# Patient Record
Sex: Female | Born: 1945 | Race: Black or African American | Hispanic: No | Smoking: Never smoker
Health system: Southern US, Community
[De-identification: ages and names within clinical notes are randomized; demographics above are authoritative.]

## PROBLEM LIST (undated history)

## (undated) DIAGNOSIS — T7840XA Allergy, unspecified, initial encounter: Secondary | ICD-10-CM

## (undated) DIAGNOSIS — I739 Peripheral vascular disease, unspecified: Secondary | ICD-10-CM

## (undated) DIAGNOSIS — R112 Nausea with vomiting, unspecified: Secondary | ICD-10-CM

## (undated) DIAGNOSIS — C50919 Malignant neoplasm of unspecified site of unspecified female breast: Secondary | ICD-10-CM

## (undated) DIAGNOSIS — J45909 Unspecified asthma, uncomplicated: Secondary | ICD-10-CM

## (undated) DIAGNOSIS — M349 Systemic sclerosis, unspecified: Secondary | ICD-10-CM

## (undated) DIAGNOSIS — I10 Essential (primary) hypertension: Secondary | ICD-10-CM

## (undated) DIAGNOSIS — M1990 Unspecified osteoarthritis, unspecified site: Secondary | ICD-10-CM

## (undated) DIAGNOSIS — Z9889 Other specified postprocedural states: Secondary | ICD-10-CM

## (undated) DIAGNOSIS — E7800 Pure hypercholesterolemia, unspecified: Secondary | ICD-10-CM

## (undated) DIAGNOSIS — K279 Peptic ulcer, site unspecified, unspecified as acute or chronic, without hemorrhage or perforation: Secondary | ICD-10-CM

## (undated) HISTORY — DX: Malignant neoplasm of unspecified site of unspecified female breast: C50.919

## (undated) HISTORY — DX: Essential (primary) hypertension: I10

## (undated) HISTORY — DX: Peptic ulcer, site unspecified, unspecified as acute or chronic, without hemorrhage or perforation: K27.9

## (undated) HISTORY — DX: Unspecified osteoarthritis, unspecified site: M19.90

## (undated) HISTORY — PX: OTHER SURGICAL HISTORY: SHX169

## (undated) HISTORY — PX: KNEE ARTHROPLASTY: SHX992

## (undated) HISTORY — DX: Peripheral vascular disease, unspecified: I73.9

## (undated) HISTORY — DX: Allergy, unspecified, initial encounter: T78.40XA

## (undated) HISTORY — PX: CYST REMOVAL HAND: SHX6279

## (undated) HISTORY — DX: Pure hypercholesterolemia, unspecified: E78.00

---

## 1992-02-20 DIAGNOSIS — M940 Chondrocostal junction syndrome [Tietze]: Secondary | ICD-10-CM | POA: Insufficient documentation

## 2003-11-19 DIAGNOSIS — S161XXA Strain of muscle, fascia and tendon at neck level, initial encounter: Secondary | ICD-10-CM | POA: Insufficient documentation

## 2006-01-25 ENCOUNTER — Other Ambulatory Visit: Admission: RE | Admit: 2006-01-25 | Discharge: 2006-01-25 | Payer: Self-pay | Admitting: Family Medicine

## 2006-11-24 ENCOUNTER — Ambulatory Visit: Payer: Self-pay | Admitting: Family Medicine

## 2006-11-29 ENCOUNTER — Ambulatory Visit: Payer: Self-pay | Admitting: Family Medicine

## 2006-11-29 ENCOUNTER — Encounter: Admission: RE | Admit: 2006-11-29 | Discharge: 2006-11-29 | Payer: Self-pay | Admitting: *Deleted

## 2007-01-31 ENCOUNTER — Ambulatory Visit: Payer: Self-pay | Admitting: Family Medicine

## 2008-04-15 ENCOUNTER — Ambulatory Visit: Payer: Self-pay | Admitting: Family Medicine

## 2008-06-23 LAB — HM COLONOSCOPY

## 2008-07-02 ENCOUNTER — Other Ambulatory Visit: Admission: RE | Admit: 2008-07-02 | Discharge: 2008-07-02 | Payer: Self-pay | Admitting: Family Medicine

## 2008-07-02 ENCOUNTER — Ambulatory Visit: Payer: Self-pay | Admitting: Family Medicine

## 2008-07-02 ENCOUNTER — Encounter: Payer: Self-pay | Admitting: Family Medicine

## 2008-07-05 ENCOUNTER — Telehealth: Payer: Self-pay | Admitting: Gastroenterology

## 2008-07-05 ENCOUNTER — Ambulatory Visit: Payer: Self-pay | Admitting: Gastroenterology

## 2008-07-12 ENCOUNTER — Ambulatory Visit: Payer: Self-pay | Admitting: Family Medicine

## 2008-07-17 ENCOUNTER — Ambulatory Visit: Payer: Self-pay | Admitting: Gastroenterology

## 2008-10-01 ENCOUNTER — Ambulatory Visit: Payer: Self-pay | Admitting: Family Medicine

## 2009-03-24 ENCOUNTER — Ambulatory Visit: Payer: Self-pay | Admitting: Family Medicine

## 2009-08-22 ENCOUNTER — Ambulatory Visit: Payer: Self-pay | Admitting: Family Medicine

## 2010-01-05 ENCOUNTER — Ambulatory Visit: Payer: Self-pay | Admitting: Family Medicine

## 2010-06-08 ENCOUNTER — Ambulatory Visit: Payer: Self-pay | Admitting: Family Medicine

## 2010-07-10 ENCOUNTER — Ambulatory Visit: Payer: Self-pay | Admitting: Family Medicine

## 2010-08-11 ENCOUNTER — Ambulatory Visit: Payer: Self-pay | Admitting: Cardiology

## 2011-02-03 ENCOUNTER — Encounter: Payer: Self-pay | Admitting: Family Medicine

## 2011-02-03 DIAGNOSIS — G43809 Other migraine, not intractable, without status migrainosus: Secondary | ICD-10-CM

## 2011-02-03 DIAGNOSIS — E876 Hypokalemia: Secondary | ICD-10-CM

## 2011-02-03 DIAGNOSIS — S161XXA Strain of muscle, fascia and tendon at neck level, initial encounter: Secondary | ICD-10-CM

## 2011-02-03 DIAGNOSIS — M940 Chondrocostal junction syndrome [Tietze]: Secondary | ICD-10-CM

## 2011-03-04 ENCOUNTER — Ambulatory Visit (INDEPENDENT_AMBULATORY_CARE_PROVIDER_SITE_OTHER): Payer: BC Managed Care – PPO | Admitting: Family Medicine

## 2011-03-04 ENCOUNTER — Encounter: Payer: Self-pay | Admitting: Family Medicine

## 2011-03-04 VITALS — BP 114/76 | HR 72 | Ht 68.4 in | Wt 178.0 lb

## 2011-03-04 DIAGNOSIS — Z0000 Encounter for general adult medical examination without abnormal findings: Secondary | ICD-10-CM

## 2011-03-04 DIAGNOSIS — M129 Arthropathy, unspecified: Secondary | ICD-10-CM

## 2011-03-04 DIAGNOSIS — D229 Melanocytic nevi, unspecified: Secondary | ICD-10-CM

## 2011-03-04 DIAGNOSIS — M1990 Unspecified osteoarthritis, unspecified site: Secondary | ICD-10-CM

## 2011-03-04 DIAGNOSIS — J309 Allergic rhinitis, unspecified: Secondary | ICD-10-CM

## 2011-03-04 DIAGNOSIS — S161XXA Strain of muscle, fascia and tendon at neck level, initial encounter: Secondary | ICD-10-CM

## 2011-03-04 DIAGNOSIS — E785 Hyperlipidemia, unspecified: Secondary | ICD-10-CM

## 2011-03-04 DIAGNOSIS — D239 Other benign neoplasm of skin, unspecified: Secondary | ICD-10-CM

## 2011-03-04 DIAGNOSIS — I10 Essential (primary) hypertension: Secondary | ICD-10-CM | POA: Insufficient documentation

## 2011-03-04 LAB — LIPID PANEL
Cholesterol: 200 mg/dL (ref 0–200)
LDL Cholesterol: 127 mg/dL — ABNORMAL HIGH (ref 0–99)
Total CHOL/HDL Ratio: 3.8 Ratio
Triglycerides: 99 mg/dL (ref <150)
VLDL: 20 mg/dL (ref 0–40)

## 2011-03-04 LAB — CBC WITH DIFFERENTIAL/PLATELET
Basophils Absolute: 0 10*3/uL (ref 0.0–0.1)
Eosinophils Absolute: 0.2 10*3/uL (ref 0.0–0.7)
Eosinophils Relative: 5 % (ref 0–5)
HCT: 42.5 % (ref 36.0–46.0)
Lymphocytes Relative: 47 % — ABNORMAL HIGH (ref 12–46)
MCHC: 33.4 g/dL (ref 30.0–36.0)
Neutrophils Relative %: 37 % — ABNORMAL LOW (ref 43–77)
WBC: 4.4 10*3/uL (ref 4.0–10.5)

## 2011-03-04 LAB — COMPREHENSIVE METABOLIC PANEL
AST: 29 U/L (ref 0–37)
Albumin: 4.5 g/dL (ref 3.5–5.2)
Alkaline Phosphatase: 70 U/L (ref 39–117)
Creat: 0.83 mg/dL (ref 0.40–1.20)
Glucose, Bld: 95 mg/dL (ref 70–99)
Potassium: 3.6 mEq/L (ref 3.5–5.3)
Sodium: 141 mEq/L (ref 135–145)
Total Bilirubin: 1 mg/dL (ref 0.3–1.2)
Total Protein: 6.9 g/dL (ref 6.0–8.3)

## 2011-03-04 LAB — POCT URINALYSIS DIPSTICK
Glucose, UA: NEGATIVE
Ketones, UA: NEGATIVE
Protein, UA: NEGATIVE

## 2011-03-04 MED ORDER — LISINOPRIL-HYDROCHLOROTHIAZIDE 10-12.5 MG PO TABS: 1.0000 | ORAL_TABLET | Freq: Every day | ORAL | Status: DC

## 2011-03-04 MED ORDER — ROSUVASTATIN CALCIUM 40 MG PO TABS: 40.0000 mg | ORAL_TABLET | Freq: Every day | ORAL | Status: DC

## 2011-03-04 MED ORDER — EZETIMIBE 10 MG PO TABS: 10.0000 mg | ORAL_TABLET | Freq: Every day | ORAL | Status: DC

## 2011-03-04 NOTE — Patient Instructions (Signed)
Continue on present medications. Continue to make plans for your retirement.

## 2011-03-04 NOTE — Progress Notes (Signed)
  Subjective:    Patient ID: Rhonda Buchanan, female    DOB: Jan 11, 1946, 65 y.o.   MRN: [id number]  HPI he is here for a complete examination. Does have several lesions on her breasts that she would like evaluated. Otherwise she has no particular questions or concerns. Her past medical history, family and social history were reviewed and are record. She does plan to retire in the near future but at this time does not have plans made to keep her busy.    Review of Systems  Constitutional: Negative.   HENT: Negative.   Eyes: Negative.   Respiratory: Negative.   Cardiovascular: Negative.   Gastrointestinal: Negative.   Genitourinary: Negative.   Neurological: Negative.   Hematological: Negative.   Psychiatric/Behavioral: Negative.        Objective:   Physical ExamBP 114/76  Pulse 72  Ht 5' 8.4" (1.737 m)  Wt 178 lb (80.74 kg)  BMI 26.75 kg/m2  LMP 02/16/2003  General Appearance:    Alert, cooperative, no distress, appears stated age  Head:    Normocephalic, without obvious abnormality, atraumatic  Eyes:    PERRL, conjunctiva/corneas clear, EOM's intact, fundi    benign  Ears:    Normal TM's and external ear canals  Nose:   Nares normal, mucosa normal, no drainage or sinus   tenderness  Throat:   Lips, mucosa, and tongue normal; teeth and gums normal  Neck:   Supple, no lymphadenopathy;  thyroid:  no   enlargement/tenderness/nodules; no carotid   bruit or JVD  Back:    Spine nontender, no curvature, ROM normal, no CVA     tenderness  Lungs:     Clear to auscultation bilaterally without wheezes, rales or     ronchi; respirations unlabored  Chest Wall:    No tenderness or deformity   Heart:    Regular rate and rhythm, S1 and S2 normal, no murmur, rub   or gallop  Breast Exam:    a segmented lesion with well-demarcated borders is noted on the right nipple   Abdomen:     Soft, non-tender, nondistended, normoactive bowel sounds,    no masses, no hepatosplenomegaly  Genitalia:     deferred      Extremities:   No clubbing, cyanosis or edema  Pulses:   2+ and symmetric all extremities  Skin:   Skin color, texture, turgor normal, no rashes or lesions  Lymph nodes:   Cervical, supraclavicular, and axillary nodes normal  Neurologic:   CNII-XII intact, normal strength, sensation and gait; reflexes 2+ and symmetric throughout          Psych:   Normal mood, affect, hygiene and grooming.            Assessment & Plan:  see chronic problem list Routine blood work No further intervention needed for the benign mole on her breast

## 2011-03-05 ENCOUNTER — Telehealth: Payer: Self-pay

## 2011-03-05 NOTE — Telephone Encounter (Signed)
Called pt left message that labs ok continue on present meds and modify diet

## 2011-04-17 ENCOUNTER — Other Ambulatory Visit: Payer: Self-pay | Admitting: Family Medicine

## 2011-05-31 ENCOUNTER — Ambulatory Visit (INDEPENDENT_AMBULATORY_CARE_PROVIDER_SITE_OTHER): Payer: BC Managed Care – PPO | Admitting: Medical

## 2011-05-31 ENCOUNTER — Encounter: Payer: Self-pay | Admitting: Medical

## 2011-05-31 ENCOUNTER — Ambulatory Visit
Admission: RE | Admit: 2011-05-31 | Discharge: 2011-05-31 | Disposition: A | Discharge status: Home / Self Care | Payer: BC Managed Care – PPO | Source: Ambulatory Visit | Attending: Medical | Admitting: Medical

## 2011-05-31 VITALS — BP 150/88 | HR 80 | Temp 98.2°F | Resp 20 | Ht 67.0 in | Wt 186.0 lb

## 2011-05-31 DIAGNOSIS — M25512 Pain in left shoulder: Secondary | ICD-10-CM

## 2011-05-31 DIAGNOSIS — M542 Cervicalgia: Secondary | ICD-10-CM

## 2011-05-31 DIAGNOSIS — M25519 Pain in unspecified shoulder: Secondary | ICD-10-CM

## 2011-05-31 DIAGNOSIS — R209 Unspecified disturbances of skin sensation: Secondary | ICD-10-CM

## 2011-05-31 DIAGNOSIS — R202 Paresthesia of skin: Secondary | ICD-10-CM

## 2011-05-31 MED ORDER — TIZANIDINE HCL 4 MG PO TABS: ORAL_TABLET | ORAL | Status: DC

## 2011-05-31 NOTE — Patient Instructions (Signed)
Use Aleve, 1-2 tablets up to twice daily as needed for neck and shoulder pain.  Use Tizaninde muscle relaxer either at bedtime, or up to twice daily as needed.  Use some stretching and range of motion exercise daily to help with the neck and shoulder pain.  Heat pad as desired.   Avoid heavy lifting or strenuous activity with arms/neck for the time being.

## 2011-05-31 NOTE — Progress Notes (Signed)
Subjective:   HPI  Rhonda Buchanan is a 65 y.o. female who presents for c/o shoulder and neck pain for 1+ month, but over the weekend this was worse.  When she goes to raise the left arm, gets more pain, and feels like a knot in the area.  Today her tricep muscles are achy as well.  Denies injury, trauma, fall.  She does note going to see physical therapy in remote past for shoulder issue, but doesn't recall which shoulder.  She does exercise some at the gym, not much with weight lifting.  She normally rides the stationary bike.  Does some light strength training.  Denies any recent increase in strenuous activity in the last week.  She is using some Advil with some relief.  She does notes at times when bending over, gets tingling in both fingertips.  No other aggravating or relieving factors.  No other c/o.  The following portions of the patient's history were reviewed and updated as appropriate: allergies, current medications, past family history, past medical history, past social history, past surgical history and problem list.  Past Medical History  Diagnosis Date  . Hypertension   . Hypercholesterolemia   . PUD (peptic ulcer disease)   . Arthritis   . Allergy     Review of Systems Constitutional: denies fever, chills, sweats, unexpected weight change, anorexia, fatigue Dermatology: denies rash Cardiology: denies chest pain, palpitations, edema Respiratory: denies cough, shortness of breath, wheezing Musculoskeletal: denies joint swelling, back pain Neurology: no headache, weakness     Objective:   Physical Exam  General appearance: alert, no distress, WD/WN, black female Neck: supple, no lymphadenopathy, no thyromegaly, no masses, nontender, normal ROM Back: non tender Musculoskeletal: no tenderness to palpation of left shoulder, but pain with left hawkins test, mild pain with left empty can test and with labral test, reduced internal and external ROM on the left, otherwise UE  bilat normal ROM, nontender, special tests otherwise normal. Extremities: no edema, no cyanosis, no clubbing Pulses: 2+ symmetric, upper extremities, normal cap refill Neurological: UE with normal strength, sensation and DTRs  Assessment :    Encounter Diagnoses  Name Primary?  . Cervicalgia Yes  . Left shoulder pain   . Paresthesia      Plan:    Advised that etiology could be C spine radulocatphy vs arthritis, and possible left shoulder arthritis vs labral issue.  We will get plain film xrays.  Advised and instructed on ROM and stretching exercise, use OTC Aleve prn, script for Tizanidine given prn use, heat, relative rest.  Will send for xrays.  Recheck in 3-4 wk if not improving.

## 2011-06-03 ENCOUNTER — Telehealth: Payer: Self-pay | Admitting: *Deleted

## 2011-06-03 NOTE — Telephone Encounter (Addendum)
Message copied by Dorthula Perfect on Thu Jun 03, 2011  9:46 AM ------      Message from: Aleen Campi, DAVID S      Created: Mon May 31, 2011  7:17 PM       Her C spine and shoulder xrays suggest arthritis and disc space narrowing in the C spine which could be pressing on a nerve.  Thus, either way, the neck or the shoulder both could be giving her the symptoms.  C/t with same plan - stretching, ROM exercise, moist mild heat, Aleve BID, muscle relaxer QHS or BID.  Recheck 3-4 wk, sooner prn.  Pt notified of xray results.  Pt agreed to continue with the same plan as discussed above.  CM, LPN

## 2011-07-06 ENCOUNTER — Telehealth: Payer: Self-pay | Admitting: Family Medicine

## 2011-07-06 MED ORDER — METOPROLOL SUCCINATE ER 50 MG PO TB24: 25.0000 mg | ORAL_TABLET | Freq: Every day | ORAL | Status: DC

## 2011-07-06 NOTE — Telephone Encounter (Signed)
Toprol renewed 

## 2011-10-29 ENCOUNTER — Telehealth: Payer: Self-pay | Admitting: Internal Medicine

## 2011-10-29 MED ORDER — EZETIMIBE 10 MG PO TABS: 10.0000 mg | ORAL_TABLET | Freq: Every day | ORAL | Status: DC

## 2011-10-29 MED ORDER — ROSUVASTATIN CALCIUM 40 MG PO TABS: 40.0000 mg | ORAL_TABLET | Freq: Every day | ORAL | Status: DC

## 2011-10-29 NOTE — Telephone Encounter (Signed)
Med sent in.

## 2011-10-29 NOTE — Telephone Encounter (Signed)
[facility] will be a 90day supply

## 2011-10-29 NOTE — Telephone Encounter (Signed)
Sent 90 days no refill [facility] and 30 day no refill [facility] [address]

## 2011-12-25 ENCOUNTER — Other Ambulatory Visit: Payer: Self-pay | Admitting: Family Medicine

## 2012-03-09 ENCOUNTER — Ambulatory Visit (INDEPENDENT_AMBULATORY_CARE_PROVIDER_SITE_OTHER): Payer: Medicare Other | Admitting: Family Medicine

## 2012-03-09 ENCOUNTER — Encounter: Payer: Self-pay | Admitting: Family Medicine

## 2012-03-09 VITALS — BP 138/80 | Temp 98.1°F | Ht 66.0 in | Wt 190.0 lb

## 2012-03-09 DIAGNOSIS — Z79899 Other long term (current) drug therapy: Secondary | ICD-10-CM

## 2012-03-09 DIAGNOSIS — R319 Hematuria, unspecified: Secondary | ICD-10-CM

## 2012-03-09 DIAGNOSIS — E785 Hyperlipidemia, unspecified: Secondary | ICD-10-CM

## 2012-03-09 DIAGNOSIS — I10 Essential (primary) hypertension: Secondary | ICD-10-CM

## 2012-03-09 DIAGNOSIS — M129 Arthropathy, unspecified: Secondary | ICD-10-CM

## 2012-03-09 DIAGNOSIS — J309 Allergic rhinitis, unspecified: Secondary | ICD-10-CM

## 2012-03-09 DIAGNOSIS — M1990 Unspecified osteoarthritis, unspecified site: Secondary | ICD-10-CM

## 2012-03-09 LAB — CBC WITH DIFFERENTIAL/PLATELET
Basophils Relative: 1 % (ref 0–1)
Eosinophils Absolute: 0.2 10*3/uL (ref 0.0–0.7)
Eosinophils Relative: 4 % (ref 0–5)
HCT: 40.5 % (ref 36.0–46.0)
Hemoglobin: 13.9 g/dL (ref 12.0–15.0)
MCHC: 34.3 g/dL (ref 30.0–36.0)
Platelets: 248 10*3/uL (ref 150–400)
RBC: 4.64 MIL/uL (ref 3.87–5.11)
WBC: 4.1 10*3/uL (ref 4.0–10.5)

## 2012-03-09 LAB — POCT URINALYSIS DIPSTICK
Bilirubin, UA: NEGATIVE
Ketones, UA: NEGATIVE
Leukocytes, UA: NEGATIVE
Protein, UA: NEGATIVE
Spec Grav, UA: <=1.005
pH, UA: 6

## 2012-03-09 LAB — COMPREHENSIVE METABOLIC PANEL
AST: 25 U/L (ref 0–37)
Alkaline Phosphatase: 67 U/L (ref 39–117)
CO2: 31 mEq/L (ref 19–32)
Chloride: 100 mEq/L (ref 96–112)
Creat: 0.77 mg/dL (ref 0.50–1.10)
Glucose, Bld: 94 mg/dL (ref 70–99)
Potassium: 3.3 mEq/L — ABNORMAL LOW (ref 3.5–5.3)
Sodium: 140 mEq/L (ref 135–145)
Total Protein: 7.1 g/dL (ref 6.0–8.3)

## 2012-03-09 LAB — LIPID PANEL: Cholesterol: 194 mg/dL (ref 0–200)

## 2012-03-09 MED ORDER — EZETIMIBE 10 MG PO TABS: 10.0000 mg | ORAL_TABLET | Freq: Every day | ORAL | Status: DC

## 2012-03-09 MED ORDER — LISINOPRIL-HYDROCHLOROTHIAZIDE 10-12.5 MG PO TABS: 1.0000 | ORAL_TABLET | Freq: Every day | ORAL | Status: DC

## 2012-03-09 MED ORDER — ROSUVASTATIN CALCIUM 40 MG PO TABS: 40.0000 mg | ORAL_TABLET | Freq: Every day | ORAL | Status: DC

## 2012-03-09 MED ORDER — METOPROLOL SUCCINATE ER 50 MG PO TB24: 50.0000 mg | ORAL_TABLET | Freq: Every day | ORAL | Status: DC

## 2012-03-09 NOTE — Progress Notes (Signed)
  Subjective:    Patient ID: Rhonda Buchanan, female    DOB: 28-Sep-1946, 66 y.o.   MRN: [id number]  HPI She is here for an interval evaluation. She continues on her is that he and Crestor and is having no difficulty with this. She continues on lisinopril. She does take Claritin for her allergies. She does have difficulty with arthritis and is using OTC medications for this. She is not complaining of frequency, urgency, dysuria. She has not had any difficulty with her migraines. Her social history was reviewed. She continues to work although she does not need to.   Review of Systems     Objective:   Physical Exam alert and in no distress. Tympanic membranes and canals are normal. Throat is clear. Tonsils are normal. Neck is supple without adenopathy or thyromegaly. Cardiac exam shows a regular sinus rhythm without murmurs or gallops. Lungs are clear to auscultation. DTRs are normal. Knee and ankle exam shows no obvious deformity or swelling to        Assessment & Plan:   1. Hypertension  lisinopril-hydrochlorothiazide (PRINZIDE,ZESTORETIC) 10-12.5 MG per tablet, metoprolol succinate (TOPROL-XL) 50 MG 24 hr tablet, CBC with Differential, Comprehensive metabolic panel  2. Hyperlipidemia  ezetimibe (ZETIA) 10 MG tablet, rosuvastatin (CRESTOR) 40 MG tablet, Lipid panel  3. Allergic rhinitis    4. Arthritis    5. Encounter for long-term (current) use of other medications  CBC with Differential, Comprehensive metabolic panel, Lipid panel  6. Blood in urine  Urine culture, POCT Urinalysis Dipstick

## 2012-03-09 NOTE — Patient Instructions (Signed)
Switch to Allegra to see if this will help with your allergies

## 2012-03-11 LAB — URINE CULTURE

## 2012-03-15 ENCOUNTER — Other Ambulatory Visit: Payer: Self-pay

## 2012-03-15 DIAGNOSIS — I10 Essential (primary) hypertension: Secondary | ICD-10-CM

## 2012-03-15 MED ORDER — METOPROLOL SUCCINATE ER 50 MG PO TB24: 50.0000 mg | ORAL_TABLET | Freq: Every day | ORAL | Status: DC

## 2012-03-15 NOTE — Telephone Encounter (Signed)
Resent metoprolol with better directions

## 2012-04-16 ENCOUNTER — Emergency Department (INDEPENDENT_AMBULATORY_CARE_PROVIDER_SITE_OTHER): Payer: Medicare Other

## 2012-04-16 ENCOUNTER — Encounter (HOSPITAL_COMMUNITY): Payer: Self-pay

## 2012-04-16 ENCOUNTER — Emergency Department (INDEPENDENT_AMBULATORY_CARE_PROVIDER_SITE_OTHER)
Admission: EM | Admit: 2012-04-16 | Discharge: 2012-04-16 | Disposition: A | Discharge status: Home / Self Care | Payer: Medicare Other | Source: Home / Self Care

## 2012-04-16 DIAGNOSIS — S99919A Unspecified injury of unspecified ankle, initial encounter: Secondary | ICD-10-CM

## 2012-04-16 DIAGNOSIS — S8990XA Unspecified injury of unspecified lower leg, initial encounter: Secondary | ICD-10-CM

## 2012-04-16 DIAGNOSIS — M79609 Pain in unspecified limb: Secondary | ICD-10-CM

## 2012-04-16 DIAGNOSIS — M79676 Pain in unspecified toe(s): Secondary | ICD-10-CM

## 2012-04-16 DIAGNOSIS — S99929A Unspecified injury of unspecified foot, initial encounter: Secondary | ICD-10-CM

## 2012-04-16 MED ORDER — CELECOXIB 200 MG PO CAPS: 200.0000 mg | ORAL_CAPSULE | Freq: Two times a day (BID) | ORAL | Status: DC

## 2012-04-16 MED ORDER — TRAMADOL HCL 50 MG PO TABS: 50.0000 mg | ORAL_TABLET | Freq: Four times a day (QID) | ORAL | Status: AC | PRN

## 2012-04-16 MED ORDER — TRAMADOL HCL 50 MG PO TABS: 50.0000 mg | ORAL_TABLET | Freq: Four times a day (QID) | ORAL | Status: DC | PRN

## 2012-04-16 MED ORDER — CELECOXIB 200 MG PO CAPS: 200.0000 mg | ORAL_CAPSULE | Freq: Two times a day (BID) | ORAL | Status: AC

## 2012-04-16 NOTE — ED Notes (Signed)
Pt stumped rt third and fourth toe last pm, painful and difficulty with ambulation.

## 2012-04-16 NOTE — ED Provider Notes (Signed)
History     CSN: [id number]  Arrival date & time 04/16/12  1301   None     Chief Complaint  Patient presents with  . Toe Injury    (Consider location/radiation/quality/duration/timing/severity/associated sxs/prior treatment) The history is provided by the patient.  patient reports she hit her right foot last night around 11pm on bed post, states immediate pain to fourth toe and she felt like it was out of place, attempted to "put it back into place" last night, today continued pain.  Ambulates without difficulty, able to wiggle toes, pain throbbing in nature.  Denies previous history of injury/surgery to right foot.  Past Medical History  Diagnosis Date  . Hypertension   . Hypercholesterolemia   . PUD (peptic ulcer disease)   . Arthritis   . Allergy     Past Surgical History  Procedure Date  . Arthroscopy     Family History  Problem Relation Age of Onset  . Ulcers Sister   . Ulcers Brother   . Heart disease Brother 50    MI  . Cancer Maternal Grandmother   . Heart disease Maternal Grandfather   . Cancer Father     History  Substance Use Topics  . Smoking status: Never Smoker   . Smokeless tobacco: Never Used  . Alcohol Use: No    OB History    Grav Para Term Preterm Abortions TAB SAB Ect Mult Living                  Review of Systems  All other systems reviewed and are negative.    Allergies  Review of patient's allergies indicates no known allergies.  Home Medications   Current Outpatient Rx  Name Route Sig Dispense Refill  . CALCIUM CARBONATE 600 MG PO TABS Oral Take 600 mg by mouth daily.      . CRESTOR 40 MG PO TABS  TAKE 1 TABLET (40 MG TOTAL) BY MOUTH DAILY. 30 tablet 2  . EZETIMIBE 10 MG PO TABS Oral Take 1 tablet (10 mg total) by mouth daily. 90 tablet 3  . LISINOPRIL-HYDROCHLOROTHIAZIDE 10-12.5 MG PO TABS Oral Take 1 tablet by mouth daily. 90 tablet 3  . LORATADINE 10 MG PO TABS Oral Take 10 mg by mouth daily.      Marland Kitchen METOPROLOL  SUCCINATE ER 50 MG PO TB24 Oral Take 1 tablet (50 mg total) by mouth daily. Pt is to take .25 a 1/2 a tablet daily 90 tablet 3  . MULTIVITAMINS PO TABS Oral Take 1 tablet by mouth daily.      Marland Kitchen ROSUVASTATIN CALCIUM 40 MG PO TABS Oral Take 1 tablet (40 mg total) by mouth daily. 90 tablet 3    BP 179/82  Pulse 64  Temp 97.9 F (36.6 C) (Oral)  Resp 16  SpO2 99%  LMP 02/16/2003  Physical Exam  Nursing note and vitals reviewed. Constitutional: She is oriented to person, place, and time. Vital signs are normal. She appears well-developed and well-nourished. She is active and cooperative.  HENT:  Head: Normocephalic.  Eyes: Conjunctivae are normal. Pupils are equal, round, and reactive to light. No scleral icterus.  Neck: Trachea normal. Neck supple.  Cardiovascular: Normal rate and regular rhythm.   Pulmonary/Chest: Effort normal and breath sounds normal.  Musculoskeletal:       Right ankle: Normal.       Feet:  Neurological: She is alert and oriented to person, place, and time. No cranial nerve deficit or sensory deficit.  Skin: Skin is warm, dry and intact.  Psychiatric: She has a normal mood and affect. Her speech is normal and behavior is normal. Judgment and thought content normal. Cognition and memory are normal.    ED Course  Procedures (including critical care time)  Labs Reviewed - No data to display Dg Foot Complete Right  04/16/2012  *RADIOLOGY REPORT*  Clinical Data: Injury to foot with continued pain.  RIGHT FOOT COMPLETE - 3+ VIEW  Comparison: None.  Findings: Oblique fracture through the shaft of the proximal phalanx of the fourth toe noted  No additional fracture identified.  Alignment at the Lisfranc joint appears normal.  Achilles calcaneal spur noted.  IMPRESSION:  1.  Oblique fracture through the shaft of the proximal phalanx of the fourth toe.  Original Report Authenticated By: Dellia Cloud, M.D.     1. Toe injury   2. Toe pain       MDM  Imaging  shows fracture, keep toes taped together, take medication for pain.  Follow up with orthopedist as needed.          Johnsie Kindred, NP 04/16/12 2045

## 2012-04-17 NOTE — ED Provider Notes (Signed)
Medical screening examination/treatment/procedure(s) were performed by resident physician or non-physician practitioner and as supervising physician I was immediately available for consultation/collaboration.   Barkley Bruns MD.    Linna Hoff, MD 04/17/12 2028

## 2012-04-21 ENCOUNTER — Telehealth (HOSPITAL_COMMUNITY): Payer: Self-pay | Admitting: *Deleted

## 2012-04-21 NOTE — ED Notes (Signed)
[facility] faxed order for Celebrex and asked for quantity to be added- for BID # 180.  Given to Lannie Fields NP and she wrote it out on fax on 7/3 and signed it. I faxed it back on 7/4 and confirmation received. Vassie Moselle 04/21/2012

## 2012-08-30 ENCOUNTER — Encounter: Payer: Self-pay | Admitting: Medical

## 2012-08-30 ENCOUNTER — Ambulatory Visit (INDEPENDENT_AMBULATORY_CARE_PROVIDER_SITE_OTHER): Payer: Medicare Other | Admitting: Medical

## 2012-08-30 VITALS — BP 130/80 | HR 68 | Temp 97.7°F | Resp 16 | Wt 188.0 lb

## 2012-08-30 DIAGNOSIS — H6120 Impacted cerumen, unspecified ear: Secondary | ICD-10-CM

## 2012-08-30 DIAGNOSIS — H9319 Tinnitus, unspecified ear: Secondary | ICD-10-CM

## 2012-08-30 DIAGNOSIS — H6122 Impacted cerumen, left ear: Secondary | ICD-10-CM

## 2012-08-30 DIAGNOSIS — H68009 Unspecified Eustachian salpingitis, unspecified ear: Secondary | ICD-10-CM

## 2012-08-30 DIAGNOSIS — J329 Chronic sinusitis, unspecified: Secondary | ICD-10-CM

## 2012-08-30 MED ORDER — MOMETASONE FUROATE 50 MCG/ACT NA SUSP: 2.0000 | Freq: Every day | NASAL | Status: DC

## 2012-08-30 MED ORDER — AMOXICILLIN 875 MG PO TABS: 875.0000 mg | ORAL_TABLET | Freq: Two times a day (BID) | ORAL | Status: DC

## 2012-08-30 NOTE — Progress Notes (Signed)
Subjective: Here for c/o ringing in the ears.  Been having intermittent ringing in ears for a few weeks.  Has some pain in ears, R>L, left sinus pressure, feels some head congestion, left cheek feels puffy, and some popping in the ears.  Denies fever, cough, sore throat, sneezing, runny nose, no NVD, no itchy eyes or nose.  Using nothing for symptoms.  No prior similar. Denies hearing loss.  No disequilibrium.  Past Medical History  Diagnosis Date  . Hypertension   . Hypercholesterolemia   . PUD (peptic ulcer disease)   . Arthritis   . Allergy    ROS  Neuro: no hearing change, weakness, numbness, tingling Otherwise ROS in HPI    Objective:   Physical Exam  Filed Vitals:   08/30/12 1534  BP: 130/80  Pulse: 68  Temp: 97.7 F (36.5 C)  Resp: 16    General appearance: alert, no distress, WD/WN HEENT: normocephalic, sclerae anicteric, left ear canal impacted cerumen, right TM with serous to somewhat cloudy fluid behind TM, nares with turbinate edema, no discharge or erythema, pharynx normal Oral cavity: MMM, no lesions Neck: supple, no lymphadenopathy, no thyromegaly, no masses Heart: RRR, normal S1, S2, no murmurs Lungs: CTA bilaterally, no wheezes, rhonchi, or rales Neuro: CN2-12 intact, normal strength, sensation, DTRs, non focal exam  Assessment and Plan :    Encounter Diagnoses  Name Primary?  . Sinusitis Yes  . Eustachian salpingitis   . Tinnitus   . Impacted cerumen of left ear    With pt consent, used warm water lavage to successfully remove left ear canal cerumen.     Will treat for sinusitis and eustachian salpingitis with amoxicillin, begin sample of nasaonex, increase water intake.   If worse or not improving in 3-5 days,call or return.  If tinnitus persists, recheck.   Follow-up prn

## 2012-12-08 ENCOUNTER — Ambulatory Visit (INDEPENDENT_AMBULATORY_CARE_PROVIDER_SITE_OTHER): Payer: Medicare Other | Admitting: Family Medicine

## 2012-12-08 ENCOUNTER — Encounter: Payer: Self-pay | Admitting: Family Medicine

## 2012-12-08 VITALS — BP 120/78 | HR 70 | Wt 188.0 lb

## 2012-12-08 NOTE — Progress Notes (Signed)
  Subjective:    Patient ID: Rhonda Buchanan, female    DOB: Feb 24, 1946, 67 y.o.   MRN: [id number]  HPI She complains of skin changes in the mid abdominal area. She also has had some dryness in her lower mid back. She would like evaluated. She has also noted slight change in her bowel habits having more hard stools. In the past he has had difficulty with more loose BMs.   Review of Systems     Objective:   Physical Exam Alert and in no distress. Pigment changes are noted on the mid abdominal area but no erythema, dryness or scaling is noted. Exam of her back shows no lesions.       Assessment & Plan:  Dermatitis - Plan: Ambulatory referral to Dermatology recommended fluids, bulk in her diet and exercise to get her more regular and if further difficulty with GI symptoms, return here.

## 2013-02-06 ENCOUNTER — Encounter: Payer: Self-pay | Admitting: Family Medicine

## 2013-02-08 ENCOUNTER — Telehealth: Payer: Self-pay | Admitting: Family Medicine

## 2013-02-08 DIAGNOSIS — I10 Essential (primary) hypertension: Secondary | ICD-10-CM

## 2013-02-08 MED ORDER — LISINOPRIL-HYDROCHLOROTHIAZIDE 10-12.5 MG PO TABS: 1.0000 | ORAL_TABLET | Freq: Every day | ORAL | Status: DC

## 2013-02-08 MED ORDER — ROSUVASTATIN CALCIUM 40 MG PO TABS: ORAL_TABLET | ORAL | Status: DC

## 2013-02-08 NOTE — Telephone Encounter (Signed)
SENT MED IN 

## 2013-03-20 ENCOUNTER — Ambulatory Visit (INDEPENDENT_AMBULATORY_CARE_PROVIDER_SITE_OTHER): Payer: Medicare Other | Admitting: Family Medicine

## 2013-03-20 ENCOUNTER — Encounter: Payer: Self-pay | Admitting: Family Medicine

## 2013-03-20 VITALS — BP 124/82 | HR 82 | Ht 65.5 in | Wt 187.0 lb

## 2013-03-20 DIAGNOSIS — E785 Hyperlipidemia, unspecified: Secondary | ICD-10-CM

## 2013-03-20 DIAGNOSIS — M129 Arthropathy, unspecified: Secondary | ICD-10-CM

## 2013-03-20 DIAGNOSIS — I10 Essential (primary) hypertension: Secondary | ICD-10-CM

## 2013-03-20 DIAGNOSIS — J309 Allergic rhinitis, unspecified: Secondary | ICD-10-CM

## 2013-03-20 DIAGNOSIS — Z0000 Encounter for general adult medical examination without abnormal findings: Secondary | ICD-10-CM

## 2013-03-20 DIAGNOSIS — M1990 Unspecified osteoarthritis, unspecified site: Secondary | ICD-10-CM

## 2013-03-20 LAB — CBC WITH DIFFERENTIAL/PLATELET
Basophils Absolute: 0 10*3/uL (ref 0.0–0.1)
Eosinophils Relative: 4 % (ref 0–5)
HCT: 39.1 % (ref 36.0–46.0)
Lymphocytes Relative: 37 % (ref 12–46)
MCH: 30.2 pg (ref 26.0–34.0)
MCHC: 35.5 g/dL (ref 30.0–36.0)
MCV: 84.8 fL (ref 78.0–100.0)
Monocytes Absolute: 0.4 10*3/uL (ref 0.1–1.0)
RDW: 13.8 % (ref 11.5–15.5)
WBC: 3.1 10*3/uL — ABNORMAL LOW (ref 4.0–10.5)

## 2013-03-20 LAB — LIPID PANEL
Cholesterol: 182 mg/dL (ref 0–200)
HDL: 52 mg/dL (ref >39)
Total CHOL/HDL Ratio: 3.5 Ratio

## 2013-03-20 LAB — POCT URINALYSIS DIPSTICK
Bilirubin, UA: NEGATIVE
Glucose, UA: NEGATIVE
Ketones, UA: NEGATIVE
Leukocytes, UA: NEGATIVE
Spec Grav, UA: 1.015

## 2013-03-20 LAB — COMPREHENSIVE METABOLIC PANEL
AST: 25 U/L (ref 0–37)
BUN: 10 mg/dL (ref 6–23)
CO2: 27 mEq/L (ref 19–32)
Calcium: 9.5 mg/dL (ref 8.4–10.5)
Chloride: 101 mEq/L (ref 96–112)
Creat: 0.86 mg/dL (ref 0.50–1.10)

## 2013-03-20 MED ORDER — EZETIMIBE 10 MG PO TABS: 10.0000 mg | ORAL_TABLET | Freq: Every day | ORAL | Status: DC

## 2013-03-20 MED ORDER — ROSUVASTATIN CALCIUM 40 MG PO TABS: ORAL_TABLET | ORAL | Status: DC

## 2013-03-20 MED ORDER — LISINOPRIL-HYDROCHLOROTHIAZIDE 10-12.5 MG PO TABS: 1.0000 | ORAL_TABLET | Freq: Every day | ORAL | Status: DC

## 2013-03-20 MED ORDER — METOPROLOL SUCCINATE ER 50 MG PO TB24: 50.0000 mg | ORAL_TABLET | Freq: Every day | ORAL | Status: DC

## 2013-03-20 MED ORDER — MOMETASONE FUROATE 50 MCG/ACT NA SUSP: 2.0000 | Freq: Every day | NASAL | Status: DC

## 2013-03-20 NOTE — Progress Notes (Signed)
  Subjective:    Patient ID: Rhonda Buchanan, female    DOB: January 30, 1946, 67 y.o.   MRN: [id number]  HPI She is here for complete examination. She has no particular concerns or complaints. She is retired but has not gotten involved in any volunteer efforts yet. She continues on medications listed in the chart and has no concerns about him. Her allergies are under good control. She does not complaining of any arthritic type symptoms. Review of past medical history shows that she is up-to-date on her routine health maintenance as well as immunizations.   Review of Systems  Constitutional: Negative.   HENT: Negative.   Eyes: Negative.   Respiratory: Negative.   Cardiovascular: Negative.   Gastrointestinal: Negative.   Endocrine: Negative.   Genitourinary: Negative.   Allergic/Immunologic: Negative.   Neurological: Negative.   Hematological: Negative.   Psychiatric/Behavioral: Negative.        Objective:   Physical Exam BP 124/82  Pulse 82  Ht 5' 5.5" (1.664 m)  Wt 187 lb (84.823 kg)  BMI 30.63 kg/m2  LMP 02/16/2003  General Appearance:    Alert, cooperative, no distress, appears stated age  Head:    Normocephalic, without obvious abnormality, atraumatic  Eyes:    PERRL, conjunctiva/corneas clear, EOM's intact, fundi    benign  Ears:    Normal TM's and external ear canals  Nose:   Nares normal, mucosa normal, no drainage or sinus   tenderness  Throat:   Lips, mucosa, and tongue normal; teeth and gums normal  Neck:   Supple, no lymphadenopathy;  thyroid:  no   enlargement/tenderness/nodules; no carotid   bruit or JVD  Back:    Spine nontender, no curvature, ROM normal, no CVA     tenderness  Lungs:     Clear to auscultation bilaterally without wheezes, rales or     ronchi; respirations unlabored  Chest Wall:    No tenderness or deformity   Heart:    Regular rate and rhythm, S1 and S2 normal, no murmur, rub   or gallop  Breast Exam:    Deferred to GYN  Abdomen:     Soft,  non-tender, nondistended, normoactive bowel sounds,    no masses, no hepatosplenomegaly  Genitalia:    Deferred to GYN     Extremities:   No clubbing, cyanosis or edema  Pulses:   2+ and symmetric all extremities  Skin:   Skin color, texture, turgor normal, no rashes or lesions  Lymph nodes:   Cervical, supraclavicular, and axillary nodes normal  Neurologic:   CNII-XII intact, normal strength, sensation and gait; reflexes 2+ and symmetric throughout          Psych:   Normal mood, affect, hygiene and grooming.          Assessment & Plan:  Routine general medical examination at a health care facility - Plan: CBC with Differential, Comprehensive metabolic panel, Lipid panel  Hypertension - Plan: POCT urinalysis dipstick, DG Bone Density, lisinopril-hydrochlorothiazide (PRINZIDE,ZESTORETIC) 10-12.5 MG per tablet, metoprolol succinate (TOPROL-XL) 50 MG 24 hr tablet  Hyperlipidemia - Plan: Lipid panel, ezetimibe (ZETIA) 10 MG tablet, rosuvastatin (CRESTOR) 40 MG tablet  Allergic rhinitis - Plan: mometasone (NASONEX) 50 MCG/ACT nasal spray  Arthritis she will continue on her present medication regimen. Strongly encouraged her to keep her mind and body busy. Recommend she get involved in some volunteer organization that she does enjoy.

## 2013-03-20 NOTE — Patient Instructions (Signed)
Keep your mind and body busy

## 2013-03-21 NOTE — Progress Notes (Signed)
Quick Note:  Left message labs ok continue present meds ______

## 2013-03-26 ENCOUNTER — Telehealth: Payer: Self-pay | Admitting: Family Medicine

## 2013-03-26 DIAGNOSIS — I10 Essential (primary) hypertension: Secondary | ICD-10-CM

## 2013-03-27 LAB — HM DEXA SCAN

## 2013-03-27 MED ORDER — METOPROLOL SUCCINATE ER 50 MG PO TB24: ORAL_TABLET | ORAL | Status: DC

## 2013-03-27 NOTE — Telephone Encounter (Signed)
Prescription clarification given

## 2013-04-04 LAB — HM DEXA SCAN: HM Dexa Scan: NORMAL

## 2013-05-14 ENCOUNTER — Telehealth: Payer: Self-pay | Admitting: Family Medicine

## 2013-05-14 NOTE — Telephone Encounter (Signed)
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 She states that dr. Terri Piedra says he cant treat her for that and is sending her to a specialist in [place]. She states she was told it could be some time before she could be seen there. Pt wants to know what now, do you want to see her in the interm? Please call pt.

## 2013-05-14 NOTE — Telephone Encounter (Signed)
In tell her that this is really out of my comfort zone but work with her about possibly getting into see somebody at [facility]

## 2013-05-16 NOTE — Telephone Encounter (Signed)
LEFT MESSAGE WORD FOR WORD 

## 2013-07-11 ENCOUNTER — Telehealth: Payer: Self-pay | Admitting: Internal Medicine

## 2013-07-11 NOTE — Telephone Encounter (Signed)
Rhonda Buchanan from the dermatologist was seen over there today and he called stating her BP was 197/91but was not having any symptoms but pt was not sure if she took her bp med today but Per cheri since she has hypertension that it would be okay to be seen tomorrow by Dr. Susann Givens

## 2013-07-12 ENCOUNTER — Encounter: Payer: Self-pay | Admitting: Family Medicine

## 2013-07-12 ENCOUNTER — Ambulatory Visit (INDEPENDENT_AMBULATORY_CARE_PROVIDER_SITE_OTHER): Payer: Medicare Other | Admitting: Family Medicine

## 2013-07-12 VITALS — BP 140/90 | HR 83

## 2013-07-12 DIAGNOSIS — Z23 Encounter for immunization: Secondary | ICD-10-CM

## 2013-07-12 DIAGNOSIS — E785 Hyperlipidemia, unspecified: Secondary | ICD-10-CM

## 2013-07-12 DIAGNOSIS — I10 Essential (primary) hypertension: Secondary | ICD-10-CM

## 2013-07-12 DIAGNOSIS — M349 Systemic sclerosis, unspecified: Secondary | ICD-10-CM

## 2013-07-12 MED ORDER — METOPROLOL SUCCINATE ER 50 MG PO TB24: ORAL_TABLET | ORAL | Status: DC

## 2013-07-12 NOTE — Progress Notes (Signed)
  Subjective:    Patient ID: Rhonda Buchanan, female    DOB: Jul 31, 1946, 67 y.o.   MRN: [id number]  HPI He is here for consult concerning her blood pressure. 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 She had been on oral steroids and ended about a week ago. Yesterday she had a blood pressure reading of 190/90 over today's reading is 140/90. She is also interested in getting some her medicines readjusted. She is in a doughnut hole.   Review of Systems     Objective:   Physical Exam Alert and in no distress. Exam of her skin does show slight thickening and poker.in the appearance of hyperpigmentation noted on arms and torso.       Assessment & Plan:  Scleroderma  Hypertension  Need for prophylactic vaccination and inoculation against influenza  Hyperlipidemia  we will discontinue the Zetia at this time and reevaluate this in approximately 2 months. Also discussed her blood pressure and since it was only one reading was elevated especially in lieu of her being on steroids, I will hold off any medication adjustment.  She will return here in 2 months for blood pressure recheck and to look at her lipids.

## 2013-09-20 ENCOUNTER — Ambulatory Visit: Payer: Medicare Other | Admitting: Family Medicine

## 2013-09-24 ENCOUNTER — Encounter: Payer: Self-pay | Admitting: Family Medicine

## 2013-09-24 ENCOUNTER — Ambulatory Visit (INDEPENDENT_AMBULATORY_CARE_PROVIDER_SITE_OTHER): Payer: Medicare Other | Admitting: Family Medicine

## 2013-09-24 VITALS — BP 140/90 | HR 90 | Wt 184.0 lb

## 2013-09-24 DIAGNOSIS — E785 Hyperlipidemia, unspecified: Secondary | ICD-10-CM

## 2013-09-24 DIAGNOSIS — Z79899 Other long term (current) drug therapy: Secondary | ICD-10-CM

## 2013-09-24 DIAGNOSIS — J45909 Unspecified asthma, uncomplicated: Secondary | ICD-10-CM

## 2013-09-24 DIAGNOSIS — M349 Systemic sclerosis, unspecified: Secondary | ICD-10-CM

## 2013-09-24 NOTE — Progress Notes (Signed)
   Subjective:    Patient ID: Rhonda Buchanan, female    DOB: January 19, 1946, 67 y.o.   MRN: [id number]  HPI He is here for followup visit. She is being followed for scleroderma. They're in the process of seeing if there is any systemic involvement. She did have PFTs done which did show a little bit of asthma and she was placed on albuterol. In the last several weeks she is also had some reflux symptoms but presently is only taking TUMS. She is on methotrexate and is also on folate acid. Since her last visit she also stopped Zetia and needs to have followup lipid panel done. She did bring blood work from her last visit which was reviewed.   Review of Systems     Objective:   Physical Exam Alert and in no distress. Exam of her arms does show pigment changes as well as thickening of the dermis.       Assessment & Plan:  Scleroderma  Intrinsic asthma  Hyperlipidemia - Plan: Lipid panel  Encounter for long-term (current) use of other medications - Plan: Lipid panel  she will continue on her present medication regimen and continues to work up for possible systemic involvement. Discussed the possibility of her GI symptoms being related to her scleroderma.

## 2013-09-24 NOTE — Patient Instructions (Signed)
If you need your inhaler more than twice per week during the day or twice a month at night and let me know.

## 2013-09-25 LAB — LIPID PANEL
Cholesterol: 231 mg/dL — ABNORMAL HIGH (ref 0–200)
HDL: 56 mg/dL (ref >39)
LDL Cholesterol: 154 mg/dL — ABNORMAL HIGH (ref 0–99)
Triglycerides: 104 mg/dL (ref <150)

## 2013-09-26 ENCOUNTER — Encounter: Payer: Self-pay | Admitting: Internal Medicine

## 2013-10-16 ENCOUNTER — Encounter: Payer: Self-pay | Admitting: Family Medicine

## 2013-11-21 ENCOUNTER — Telehealth: Payer: Self-pay | Admitting: Family Medicine

## 2013-11-21 NOTE — Telephone Encounter (Signed)
Have her check with her insurance as to what they cover and set up an appt with me

## 2013-11-21 NOTE — Telephone Encounter (Signed)
Please call concerning her zetia and crestor prescriptions. They are very expensive and she wants to change to cheaper medicine  (they are Tier 3 and TIer 4)

## 2013-11-22 NOTE — Telephone Encounter (Signed)
Spoke with patient, she will check with her insurance and has scheduled an appt for Monday to discuss med

## 2013-11-26 ENCOUNTER — Telehealth: Payer: Self-pay | Admitting: Family Medicine

## 2013-11-26 ENCOUNTER — Ambulatory Visit (INDEPENDENT_AMBULATORY_CARE_PROVIDER_SITE_OTHER): Payer: Medicare Other | Admitting: Family Medicine

## 2013-11-26 ENCOUNTER — Encounter: Payer: Self-pay | Admitting: Family Medicine

## 2013-11-26 VITALS — BP 130/82 | HR 68 | Wt 185.0 lb

## 2013-11-26 DIAGNOSIS — Z8249 Family history of ischemic heart disease and other diseases of the circulatory system: Secondary | ICD-10-CM

## 2013-11-26 DIAGNOSIS — R208 Other disturbances of skin sensation: Secondary | ICD-10-CM

## 2013-11-26 DIAGNOSIS — M349 Systemic sclerosis, unspecified: Secondary | ICD-10-CM

## 2013-11-26 DIAGNOSIS — I10 Essential (primary) hypertension: Secondary | ICD-10-CM

## 2013-11-26 DIAGNOSIS — E785 Hyperlipidemia, unspecified: Secondary | ICD-10-CM

## 2013-11-26 DIAGNOSIS — R209 Unspecified disturbances of skin sensation: Secondary | ICD-10-CM

## 2013-11-26 NOTE — Patient Instructions (Addendum)
Call back with the phone number and talked to Rhonda Buchanan Once your back on the medication for couple months up an appointment for followup blood work

## 2013-11-26 NOTE — Progress Notes (Signed)
   Subjective:    Patient ID: Rhonda Buchanan, female    DOB: 1945/10/29, 68 y.o.   MRN: [id number]  HPI She is here for a medication check. Her insurance coverage for her cholesterol meds is quite elevated. She would like to reduce this if possible. Review his record indicates she was doing quite nicely on Crestor and steady. She then stopped Zetia and her lipid numbers got much worse. She does have a family history of heart disease. She does not smoke. 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 On a separate note she describes intermittent episodes of right lateral knee burning pain that lasts for several minutes and then will go away. She cannot relate this to any particular position.   Review of Systems     Objective:   Physical Exam Alert and in no distress. Full motion of the hip knees and ankle. Normal strength and DTRs. No tenderness to palpation of the lateral aspect of the leg.       Assessment & Plan:  Family history of heart disease in female family member before age 57  Hyperlipidemia  Hypertension  Dysesthesia  Scleroderma  will attempt to get her medications placed on tear 2. She will get [place] the phone number that will hopefully help [place] do that.

## 2013-12-24 MED ORDER — EZETIMIBE 10 MG PO TABS: 10.0000 mg | ORAL_TABLET | Freq: Every day | ORAL | Status: DC

## 2013-12-24 MED ORDER — ROSUVASTATIN CALCIUM 40 MG PO TABS: 40.0000 mg | ORAL_TABLET | Freq: Every day | ORAL | Status: DC

## 2013-12-24 NOTE — Telephone Encounter (Signed)
Tier exception denied, no covered brands.  Per [employer] he wants her to stay on these meds & will give samples when we have them.  Pt informed

## 2014-01-11 ENCOUNTER — Telehealth: Payer: Self-pay | Admitting: Family Medicine

## 2014-01-14 NOTE — Telephone Encounter (Signed)
Pt made aware that samples are up front

## 2014-01-30 ENCOUNTER — Other Ambulatory Visit: Payer: Self-pay | Admitting: Family Medicine

## 2014-05-20 ENCOUNTER — Ambulatory Visit (INDEPENDENT_AMBULATORY_CARE_PROVIDER_SITE_OTHER): Payer: Medicare Other | Admitting: Family Medicine

## 2014-05-20 ENCOUNTER — Encounter: Payer: Self-pay | Admitting: Family Medicine

## 2014-05-20 VITALS — BP 130/90 | HR 71 | Wt 172.0 lb

## 2014-05-20 DIAGNOSIS — M349 Systemic sclerosis, unspecified: Secondary | ICD-10-CM

## 2014-05-20 DIAGNOSIS — M1990 Unspecified osteoarthritis, unspecified site: Secondary | ICD-10-CM

## 2014-05-20 DIAGNOSIS — M129 Arthropathy, unspecified: Secondary | ICD-10-CM

## 2014-05-20 DIAGNOSIS — J301 Allergic rhinitis due to pollen: Secondary | ICD-10-CM

## 2014-05-20 DIAGNOSIS — G43809 Other migraine, not intractable, without status migrainosus: Secondary | ICD-10-CM

## 2014-05-20 DIAGNOSIS — Z23 Encounter for immunization: Secondary | ICD-10-CM

## 2014-05-20 DIAGNOSIS — I10 Essential (primary) hypertension: Secondary | ICD-10-CM

## 2014-05-20 DIAGNOSIS — E785 Hyperlipidemia, unspecified: Secondary | ICD-10-CM

## 2014-05-20 MED ORDER — EZETIMIBE 10 MG PO TABS: 10.0000 mg | ORAL_TABLET | Freq: Every day | ORAL | Status: DC

## 2014-05-20 MED ORDER — ROSUVASTATIN CALCIUM 40 MG PO TABS: ORAL_TABLET | ORAL | Status: DC

## 2014-05-20 NOTE — Progress Notes (Signed)
   Subjective:    Patient ID: Rhonda Buchanan, female    DOB: 11-12-1945, 68 y.o.   MRN: [id number]  HPI She is here for an interval evaluation. She continues to be followed for her underlying scleroderma and at this point is taking medications on a reduced basis with the idea potentially stopping. She did run out of his Crestor and Zetia. She is exercising regularly now and enjoying this. She continues on her blood pressure medications without difficulty. Her allergies are under good control. She rarely has difficulty with migraine headaches. Social and family history were reviewed. Life in general is going very well for her right now.   Review of Systems  All other systems reviewed and are negative.      Objective:   Physical Exam alert and in no distress. Tympanic membranes and canals are normal. Throat is clear. Tonsils are normal. Neck is supple without adenopathy or thyromegaly. Cardiac exam shows a regular sinus rhythm without murmurs or gallops. Lungs are clear to auscultation.        Assessment & Plan:  Scleroderma  Essential hypertension  Hyperlipidemia - Plan: rosuvastatin (CRESTOR) 40 MG tablet, ezetimibe (ZETIA) 10 MG tablet  Arthritis  Allergic rhinitis due to pollen  Migraine variant  Need for prophylactic vaccination against Streptococcus pneumoniae (pneumococcus) - Plan: Pneumococcal conjugate vaccine 13-valent  encouraged her to continue with her very active lifestyle.

## 2014-06-21 ENCOUNTER — Encounter: Payer: Self-pay | Admitting: Internal Medicine

## 2014-06-21 LAB — HM MAMMOGRAPHY

## 2014-06-25 LAB — HM MAMMOGRAPHY

## 2014-06-26 ENCOUNTER — Encounter: Payer: Self-pay | Admitting: Internal Medicine

## 2014-07-26 ENCOUNTER — Encounter: Payer: Self-pay | Admitting: Internal Medicine

## 2014-08-01 ENCOUNTER — Other Ambulatory Visit: Payer: Self-pay | Admitting: Family Medicine

## 2014-08-07 ENCOUNTER — Other Ambulatory Visit: Payer: Self-pay | Admitting: Family Medicine

## 2014-10-07 ENCOUNTER — Other Ambulatory Visit: Payer: Self-pay | Admitting: Family Medicine

## 2014-10-15 ENCOUNTER — Encounter: Payer: Self-pay | Admitting: Family Medicine

## 2014-10-24 ENCOUNTER — Ambulatory Visit (INDEPENDENT_AMBULATORY_CARE_PROVIDER_SITE_OTHER): Payer: Medicare Other | Admitting: Family Medicine

## 2014-10-24 ENCOUNTER — Encounter: Payer: Self-pay | Admitting: Family Medicine

## 2014-10-24 VITALS — BP 138/80 | HR 100 | Temp 99.9°F | Wt 171.0 lb

## 2014-10-24 DIAGNOSIS — K224 Dyskinesia of esophagus: Secondary | ICD-10-CM

## 2014-10-24 DIAGNOSIS — J209 Acute bronchitis, unspecified: Secondary | ICD-10-CM

## 2014-10-24 DIAGNOSIS — J301 Allergic rhinitis due to pollen: Secondary | ICD-10-CM

## 2014-10-24 DIAGNOSIS — M349 Systemic sclerosis, unspecified: Secondary | ICD-10-CM

## 2014-10-24 DIAGNOSIS — K449 Diaphragmatic hernia without obstruction or gangrene: Secondary | ICD-10-CM

## 2014-10-24 MED ORDER — AZITHROMYCIN 500 MG PO TABS: 500.0000 mg | ORAL_TABLET | Freq: Every day | ORAL | Status: DC

## 2014-10-24 NOTE — Progress Notes (Signed)
   Subjective:    Patient ID: Rhonda Buchanan, female    DOB: 1946-05-12, 69 y.o.   MRN: [id number]  HPI She has a two-day history of sneezing, rhinorrhea and slight sore throat. Yesterday she developed a cough that became productive. No fever or chills. She does have underlying scleroderma and recently had a GI evaluation in [place]. It did show evidence of a hiatus hernia as well as esophageal dysmotility. She does occasionally have difficulty with swallowing. She does have underlying allergies and they are under good control. She does not smoke.  Review of Systems     Objective:   Physical Exam alert and in no distress. Tympanic membranes and canals are normal. Throat is clear. Tonsils are normal. Neck is supple without adenopathy or thyromegaly. Cardiac exam shows a regular sinus rhythm without murmurs or gallops. Lungs are clear to auscultation.        Assessment & Plan:  Allergic rhinitis due to pollen  Scleroderma  Acute bronchitis, unspecified organism - Plan: azithromycin (ZITHROMAX) 500 MG tablet  Esophageal motility disorder  HH (hiatus hernia)  will call if not entirely better when she finishes the antibiotic.

## 2014-10-28 ENCOUNTER — Encounter: Payer: Self-pay | Admitting: Family Medicine

## 2014-11-04 ENCOUNTER — Telehealth: Payer: Self-pay | Admitting: Family Medicine

## 2014-11-04 DIAGNOSIS — J209 Acute bronchitis, unspecified: Secondary | ICD-10-CM

## 2014-11-04 MED ORDER — AZITHROMYCIN 500 MG PO TABS: 500.0000 mg | ORAL_TABLET | Freq: Every day | ORAL | Status: DC

## 2014-11-04 NOTE — Telephone Encounter (Signed)
Pt called and stated that she is better but still has a lot of mucus. She has finished the medication. Pt uses [facility] [address]. Please advise pt.

## 2014-11-05 DIAGNOSIS — H20023 Recurrent acute iridocyclitis, bilateral: Secondary | ICD-10-CM | POA: Diagnosis not present

## 2014-11-14 ENCOUNTER — Encounter: Payer: Self-pay | Admitting: Internal Medicine

## 2014-11-14 DIAGNOSIS — H20023 Recurrent acute iridocyclitis, bilateral: Secondary | ICD-10-CM | POA: Insufficient documentation

## 2014-11-20 DIAGNOSIS — L940 Localized scleroderma [morphea]: Secondary | ICD-10-CM | POA: Diagnosis not present

## 2014-11-20 DIAGNOSIS — Z79899 Other long term (current) drug therapy: Secondary | ICD-10-CM | POA: Diagnosis not present

## 2014-11-20 DIAGNOSIS — H209 Unspecified iridocyclitis: Secondary | ICD-10-CM | POA: Diagnosis not present

## 2014-11-20 DIAGNOSIS — L719 Rosacea, unspecified: Secondary | ICD-10-CM | POA: Diagnosis not present

## 2014-11-26 DIAGNOSIS — H40043 Steroid responder, bilateral: Secondary | ICD-10-CM | POA: Diagnosis not present

## 2014-12-04 DIAGNOSIS — H04123 Dry eye syndrome of bilateral lacrimal glands: Secondary | ICD-10-CM | POA: Diagnosis not present

## 2014-12-04 DIAGNOSIS — H20023 Recurrent acute iridocyclitis, bilateral: Secondary | ICD-10-CM | POA: Diagnosis not present

## 2014-12-26 DIAGNOSIS — H20023 Recurrent acute iridocyclitis, bilateral: Secondary | ICD-10-CM | POA: Diagnosis not present

## 2015-01-15 ENCOUNTER — Other Ambulatory Visit (INDEPENDENT_AMBULATORY_CARE_PROVIDER_SITE_OTHER): Payer: Medicare Other

## 2015-01-15 DIAGNOSIS — Z23 Encounter for immunization: Secondary | ICD-10-CM

## 2015-01-29 DIAGNOSIS — H209 Unspecified iridocyclitis: Secondary | ICD-10-CM | POA: Diagnosis not present

## 2015-01-29 DIAGNOSIS — L940 Localized scleroderma [morphea]: Secondary | ICD-10-CM | POA: Diagnosis not present

## 2015-01-29 DIAGNOSIS — Z79899 Other long term (current) drug therapy: Secondary | ICD-10-CM | POA: Diagnosis not present

## 2015-02-01 ENCOUNTER — Other Ambulatory Visit: Payer: Self-pay | Admitting: Family Medicine

## 2015-02-05 DIAGNOSIS — H40043 Steroid responder, bilateral: Secondary | ICD-10-CM | POA: Diagnosis not present

## 2015-03-14 ENCOUNTER — Encounter: Payer: Self-pay | Admitting: Gastroenterology

## 2015-04-07 DIAGNOSIS — H40053 Ocular hypertension, bilateral: Secondary | ICD-10-CM | POA: Diagnosis not present

## 2015-04-07 DIAGNOSIS — Z9849 Cataract extraction status, unspecified eye: Secondary | ICD-10-CM | POA: Diagnosis not present

## 2015-04-08 ENCOUNTER — Other Ambulatory Visit: Payer: Self-pay | Admitting: Family Medicine

## 2015-04-23 ENCOUNTER — Encounter: Payer: Self-pay | Admitting: Family Medicine

## 2015-04-23 ENCOUNTER — Ambulatory Visit (INDEPENDENT_AMBULATORY_CARE_PROVIDER_SITE_OTHER): Payer: Medicare Other | Admitting: Family Medicine

## 2015-04-23 VITALS — BP 116/74 | HR 70 | Wt 178.0 lb

## 2015-04-23 DIAGNOSIS — M1990 Unspecified osteoarthritis, unspecified site: Secondary | ICD-10-CM

## 2015-04-23 DIAGNOSIS — M349 Systemic sclerosis, unspecified: Secondary | ICD-10-CM | POA: Diagnosis not present

## 2015-04-23 DIAGNOSIS — R5383 Other fatigue: Secondary | ICD-10-CM

## 2015-04-23 DIAGNOSIS — R0609 Other forms of dyspnea: Secondary | ICD-10-CM | POA: Diagnosis not present

## 2015-04-23 DIAGNOSIS — I10 Essential (primary) hypertension: Secondary | ICD-10-CM | POA: Diagnosis not present

## 2015-04-23 DIAGNOSIS — M129 Arthropathy, unspecified: Secondary | ICD-10-CM | POA: Diagnosis not present

## 2015-04-23 LAB — COMPREHENSIVE METABOLIC PANEL
ALT: 17 U/L (ref 0–35)
AST: 19 U/L (ref 0–37)
Albumin: 4.2 g/dL (ref 3.5–5.2)
Alkaline Phosphatase: 63 U/L (ref 39–117)
BILIRUBIN TOTAL: 0.5 mg/dL (ref 0.2–1.2)
BUN: 10 mg/dL (ref 6–23)
CO2: 25 meq/L (ref 19–32)
Calcium: 9.7 mg/dL (ref 8.4–10.5)
Chloride: 103 mEq/L (ref 96–112)
Creat: 0.82 mg/dL (ref 0.50–1.10)
GLUCOSE: 104 mg/dL — AB (ref 70–99)
Potassium: 3.5 mEq/L (ref 3.5–5.3)
Sodium: 140 mEq/L (ref 135–145)
TOTAL PROTEIN: 6.8 g/dL (ref 6.0–8.3)

## 2015-04-23 LAB — CBC WITH DIFFERENTIAL/PLATELET
BASOS PCT: 0 % (ref 0–1)
Basophils Absolute: 0 10*3/uL (ref 0.0–0.1)
Eosinophils Absolute: 0.1 10*3/uL (ref 0.0–0.7)
Eosinophils Relative: 3 % (ref 0–5)
HCT: 41.1 % (ref 36.0–46.0)
HEMOGLOBIN: 14 g/dL (ref 12.0–15.0)
LYMPHS PCT: 47 % — AB (ref 12–46)
Lymphs Abs: 1.6 10*3/uL (ref 0.7–4.0)
MCH: 31.5 pg (ref 26.0–34.0)
MCHC: 34.1 g/dL (ref 30.0–36.0)
MCV: 92.6 fL (ref 78.0–100.0)
MPV: 11.1 fL (ref 8.6–12.4)
Monocytes Absolute: 0.4 10*3/uL (ref 0.1–1.0)
Monocytes Relative: 11 % (ref 3–12)
NEUTROS ABS: 1.4 10*3/uL — AB (ref 1.7–7.7)
NEUTROS PCT: 39 % — AB (ref 43–77)
Platelets: 251 10*3/uL (ref 150–400)
RBC: 4.44 MIL/uL (ref 3.87–5.11)
RDW: 14.5 % (ref 11.5–15.5)
WBC: 3.5 10*3/uL — ABNORMAL LOW (ref 4.0–10.5)

## 2015-04-23 LAB — TSH: TSH: 2.418 u[IU]/mL (ref 0.350–4.500)

## 2015-04-23 NOTE — Progress Notes (Signed)
   Subjective:    Patient ID: Rhonda Buchanan, female    DOB: 03-05-1946, 69 y.o.   MRN: [id number]  HPI She states that she has had difficulty with fatigue since April. In April her folate acid level was increased When she complained at that time to her dermatologist that she was having fatigue. She also notes that she is falling asleep quite easily. She does complain of some lower extremity swelling and discomfort. She also complains of some dyspnea on exertion but no chest pain, PND, heart rate changes. No fever, chills, cough or congestion. She does have an underlying history of scleroderma.He continues on medications listed in the chart.   Review of Systems     Objective:   Physical Exam Alert and in no distress. Tympanic membranes and canals are normal. Pharyngeal area is normal. Neck is supple without adenopathy or thyromegaly. Cardiac exam shows a regular sinus rhythm without murmurs or gallops. Lungs are clear to auscultation.Lower extremities show no true edema. Pulses are normal. EKG shows no acute changes.DTRs normal        Assessment & Plan:  Essential hypertension - Plan: CBC with Differential/Platelet, Comprehensive metabolic panel  Scleroderma  Arthritis - Plan: CBC with Differential/Platelet, Comprehensive metabolic panel  Other fatigue - Plan: CBC with Differential/Platelet, Comprehensive metabolic panel, TSH  DOE (dyspnea on exertion) - Plan: EKG 12-Lead Her history and exam are not diagnostic of anything. I will wait on the blood results and in pursue this further

## 2015-04-30 ENCOUNTER — Encounter: Payer: Self-pay | Admitting: Family Medicine

## 2015-04-30 ENCOUNTER — Ambulatory Visit (INDEPENDENT_AMBULATORY_CARE_PROVIDER_SITE_OTHER): Payer: Medicare Other | Admitting: Family Medicine

## 2015-04-30 VITALS — BP 120/70 | HR 69 | Wt 178.0 lb

## 2015-04-30 DIAGNOSIS — I10 Essential (primary) hypertension: Secondary | ICD-10-CM | POA: Diagnosis not present

## 2015-04-30 DIAGNOSIS — H20023 Recurrent acute iridocyclitis, bilateral: Secondary | ICD-10-CM | POA: Diagnosis not present

## 2015-04-30 DIAGNOSIS — M349 Systemic sclerosis, unspecified: Secondary | ICD-10-CM | POA: Diagnosis not present

## 2015-04-30 DIAGNOSIS — R5383 Other fatigue: Secondary | ICD-10-CM | POA: Diagnosis not present

## 2015-04-30 NOTE — Progress Notes (Signed)
   Subjective:    Patient ID: Rhonda Buchanan, female    DOB: 10/13/1946, 69 y.o.   MRN: [id number]  HPI She is here for a recheck. She continues to complain of fatigue and excessive sleepiness. Really no other symptoms. She does not fall sleep while driving or any for long periods of time. She does not give a history of excessive snoring or apneic episodes. She does have underlying scleroderma as well as difficulty with iridocyclitis. She has had difficulty with Raynauds phenomenon. She has noticed this especially with them last year or so.   Review of Systems     Objective:   Physical Exam Alert and in no distress otherwise not examined       Assessment & Plan:  Essential hypertension  Scleroderma - Plan: Ambulatory referral to Rheumatology  Other fatigue - Plan: Ambulatory referral to Rheumatology  Recurrent acute iridocyclitis of both eyes My concern is there is some underlying inflammatory component causing this though so far has not shown itself. I will send to rheumatology to see if they can help.

## 2015-05-21 ENCOUNTER — Telehealth: Payer: Self-pay | Admitting: Internal Medicine

## 2015-05-21 NOTE — Telephone Encounter (Signed)
Pt has given me a VERBAL OK to send records from [facility] [place] to [place] Dr. Kathi Ludwig office. She is out of town and can not come in and they are needing records before they can schedule appt. [place] [facility]o is booked out for new pt for October

## 2015-05-29 DIAGNOSIS — R5381 Other malaise: Secondary | ICD-10-CM | POA: Diagnosis not present

## 2015-05-29 DIAGNOSIS — M2550 Pain in unspecified joint: Secondary | ICD-10-CM | POA: Diagnosis not present

## 2015-05-29 DIAGNOSIS — M25571 Pain in right ankle and joints of right foot: Secondary | ICD-10-CM | POA: Diagnosis not present

## 2015-05-29 DIAGNOSIS — M25562 Pain in left knee: Secondary | ICD-10-CM | POA: Diagnosis not present

## 2015-05-29 DIAGNOSIS — Z9225 Personal history of immunosupression therapy: Secondary | ICD-10-CM | POA: Diagnosis not present

## 2015-05-29 DIAGNOSIS — M25572 Pain in left ankle and joints of left foot: Secondary | ICD-10-CM | POA: Diagnosis not present

## 2015-05-29 DIAGNOSIS — M25561 Pain in right knee: Secondary | ICD-10-CM | POA: Diagnosis not present

## 2015-05-29 DIAGNOSIS — M349 Systemic sclerosis, unspecified: Secondary | ICD-10-CM | POA: Diagnosis not present

## 2015-05-29 DIAGNOSIS — I7300 Raynaud's syndrome without gangrene: Secondary | ICD-10-CM | POA: Diagnosis not present

## 2015-06-04 ENCOUNTER — Telehealth: Payer: Self-pay | Admitting: Family Medicine

## 2015-06-04 ENCOUNTER — Ambulatory Visit
Admission: RE | Admit: 2015-06-04 | Discharge: 2015-06-04 | Disposition: A | Discharge status: Home / Self Care | Payer: Self-pay | Source: Ambulatory Visit | Attending: Family Medicine | Admitting: Family Medicine

## 2015-06-04 ENCOUNTER — Encounter: Payer: Self-pay | Admitting: Family Medicine

## 2015-06-04 DIAGNOSIS — R7611 Nonspecific reaction to tuberculin skin test without active tuberculosis: Secondary | ICD-10-CM

## 2015-06-04 NOTE — Telephone Encounter (Signed)
Check with Dr. Estanislado Pandy. I did see this test but have not received anything from her concerning me handling this.if she wants [place] to handle this, the first thing we need to do is get a chest x-ray.

## 2015-06-04 NOTE — Telephone Encounter (Signed)
Spoke to sharon with Dr. 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 I have put Chest xray order in and will advise pt to get chest xray done.

## 2015-06-04 NOTE — Telephone Encounter (Signed)
Pt concerned about lab results form Dr Estanislado Pandy which showed that she was positive for TB and pt was told that results were sent to Dr Redmond School so that he could treat her for this. Please call pt

## 2015-06-04 NOTE — Telephone Encounter (Signed)
Pt will try to get there today to get xrays done and we will notify pt with what the next step and result is

## 2015-06-05 ENCOUNTER — Encounter: Payer: Self-pay | Admitting: Family Medicine

## 2015-06-05 DIAGNOSIS — R7611 Nonspecific reaction to tuberculin skin test without active tuberculosis: Secondary | ICD-10-CM | POA: Insufficient documentation

## 2015-06-05 NOTE — Progress Notes (Signed)
   Subjective:    Patient ID: Rhonda Buchanan, female    DOB: May 25, 1946, 69 y.o.   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 Discussion with her indicates that she was exposed from her father and at age 12 was treated. I discussed this with Dr. Michel Bickers and he stated no further intervention necessary.

## 2015-06-24 ENCOUNTER — Encounter: Payer: Self-pay | Admitting: Family Medicine

## 2015-06-24 ENCOUNTER — Ambulatory Visit (INDEPENDENT_AMBULATORY_CARE_PROVIDER_SITE_OTHER): Payer: Medicare Other | Admitting: Family Medicine

## 2015-06-24 VITALS — BP 128/70 | HR 68 | Ht 65.5 in | Wt 175.6 lb

## 2015-06-24 DIAGNOSIS — G43809 Other migraine, not intractable, without status migrainosus: Secondary | ICD-10-CM

## 2015-06-24 DIAGNOSIS — Z0000 Encounter for general adult medical examination without abnormal findings: Secondary | ICD-10-CM

## 2015-06-24 DIAGNOSIS — J45909 Unspecified asthma, uncomplicated: Secondary | ICD-10-CM

## 2015-06-24 DIAGNOSIS — Z1231 Encounter for screening mammogram for malignant neoplasm of breast: Secondary | ICD-10-CM | POA: Diagnosis not present

## 2015-06-24 DIAGNOSIS — I10 Essential (primary) hypertension: Secondary | ICD-10-CM | POA: Diagnosis not present

## 2015-06-24 DIAGNOSIS — E785 Hyperlipidemia, unspecified: Secondary | ICD-10-CM

## 2015-06-24 DIAGNOSIS — R7611 Nonspecific reaction to tuberculin skin test without active tuberculosis: Secondary | ICD-10-CM | POA: Diagnosis not present

## 2015-06-24 DIAGNOSIS — H20023 Recurrent acute iridocyclitis, bilateral: Secondary | ICD-10-CM

## 2015-06-24 DIAGNOSIS — Z23 Encounter for immunization: Secondary | ICD-10-CM | POA: Diagnosis not present

## 2015-06-24 DIAGNOSIS — M349 Systemic sclerosis, unspecified: Secondary | ICD-10-CM

## 2015-06-24 DIAGNOSIS — M1990 Unspecified osteoarthritis, unspecified site: Secondary | ICD-10-CM | POA: Diagnosis not present

## 2015-06-24 DIAGNOSIS — J301 Allergic rhinitis due to pollen: Secondary | ICD-10-CM | POA: Diagnosis not present

## 2015-06-24 LAB — HM MAMMOGRAPHY: HM MAMMO: NORMAL

## 2015-06-24 NOTE — Progress Notes (Signed)
   Subjective:    Patient ID: Rhonda Buchanan, female    DOB: 05-Mar-1946, 69 y.o.   MRN: [id number]  HPI She is here for complete examination. She continues to be followed by rheumatology. Presently she is taking methotrexate. There was a questionable history of positive PPD however this was when she was 13 and she was adequately treated. She will continue on her methotrexate. She does have some difficulty with arthritis have this under good control. She has scleroderma or a variant thereof. She also has recurrent iridocyclitis and she is on medication for this. This has apparently caused some increase in her ocular pressure.She really has difficulty with migraines. Her allergies are under good control. She rarely uses her inhaler. She has no other concerns or complaints. Family history social history, health maintenance and immunizations were reviewed. She does plan on getting a mammogram.  Review of Systems  All other systems reviewed and are negative.      Objective:   Physical Exam BP 128/70 mmHg  Pulse 68  Ht 5' 5.5" (1.664 m)  Wt 175 lb 9.6 oz (79.652 kg)  BMI 28.77 kg/m2  SpO2 98%  LMP 02/16/2003  General Appearance:    Alert, cooperative, no distress, appears stated age  Head:    Normocephalic, without obvious abnormality, atraumatic  Eyes:    PERRL, conjunctiva/corneas clear, EOM's intact, fundi    Difficult to see  Ears:    Normal TM's and external ear canals  Nose:   Nares normal, mucosa normal, no drainage or sinus   tenderness  Throat:   Lips, mucosa, and tongue normal; teeth and gums normal  Neck:   Supple, no lymphadenopathy;  thyroid:  no   enlargement/tenderness/nodules; no carotid   bruit or JVD  Back:    Spine nontender, no curvature, ROM normal, no CVA     tenderness  Lungs:     Clear to auscultation bilaterally without wheezes, rales or     ronchi; respirations unlabored  Chest Wall:    No tenderness or deformity   Heart:    Regular rate and rhythm, S1 and S2  normal, no murmur, rub   or gallop     Abdomen:     Soft, non-tender, nondistended, normoactive bowel sounds,    no masses, no hepatosplenomegaly        Extremities:   No clubbing, cyanosis or edema  Pulses:   2+ and symmetric all extremities  Skin:   Skin color, texture, turgor normal, no rashes or lesions  Lymph nodes:   Cervical, supraclavicular, and axillary nodes normal  Neurologic:   CNII-XII intact, normal strength, sensation and gait; reflexes 2+ and symmetric throughout          Psych:   Normal mood, affect, hygiene and grooming.        Assessment & Plan:  Routine general medical examination at a health care facility  Positive PPD, treated  Essential hypertension  Hyperlipidemia - Plan: Lipid panel  Allergic rhinitis due to pollen  Scleroderma  Recurrent acute iridocyclitis of both eyes  Arthritis  Migraine variant  Need for prophylactic vaccination and inoculation against influenza - Plan: Flu vaccine HIGH DOSE PF (Fluzone High dose)  Asthma due to environmental allergies She will continue on her present medication regimen. In general, things seem to be be going fairly well.

## 2015-06-25 LAB — LIPID PANEL
Cholesterol: 200 mg/dL (ref 125–200)
HDL: 59 mg/dL (ref >=46)
LDL CALC: 126 mg/dL (ref <130)
Total CHOL/HDL Ratio: 3.4 Ratio (ref <=5.0)
Triglycerides: 75 mg/dL (ref <150)
VLDL: 15 mg/dL (ref <30)

## 2015-06-30 DIAGNOSIS — R6889 Other general symptoms and signs: Secondary | ICD-10-CM | POA: Diagnosis not present

## 2015-06-30 DIAGNOSIS — M170 Bilateral primary osteoarthritis of knee: Secondary | ICD-10-CM | POA: Diagnosis not present

## 2015-06-30 DIAGNOSIS — L940 Localized scleroderma [morphea]: Secondary | ICD-10-CM | POA: Diagnosis not present

## 2015-06-30 DIAGNOSIS — M349 Systemic sclerosis, unspecified: Secondary | ICD-10-CM | POA: Diagnosis not present

## 2015-07-07 DIAGNOSIS — L719 Rosacea, unspecified: Secondary | ICD-10-CM | POA: Diagnosis not present

## 2015-07-07 DIAGNOSIS — M2550 Pain in unspecified joint: Secondary | ICD-10-CM | POA: Diagnosis not present

## 2015-07-07 DIAGNOSIS — L940 Localized scleroderma [morphea]: Secondary | ICD-10-CM | POA: Diagnosis not present

## 2015-07-07 DIAGNOSIS — Z79899 Other long term (current) drug therapy: Secondary | ICD-10-CM | POA: Diagnosis not present

## 2015-07-08 DIAGNOSIS — H20023 Recurrent acute iridocyclitis, bilateral: Secondary | ICD-10-CM | POA: Diagnosis not present

## 2015-07-09 ENCOUNTER — Other Ambulatory Visit: Payer: Self-pay | Admitting: Family Medicine

## 2015-08-02 ENCOUNTER — Other Ambulatory Visit: Payer: Self-pay | Admitting: Family Medicine

## 2015-08-20 DIAGNOSIS — H11423 Conjunctival edema, bilateral: Secondary | ICD-10-CM | POA: Diagnosis not present

## 2015-08-20 DIAGNOSIS — H5713 Ocular pain, bilateral: Secondary | ICD-10-CM | POA: Diagnosis not present

## 2015-08-20 DIAGNOSIS — S0500XA Injury of conjunctiva and corneal abrasion without foreign body, unspecified eye, initial encounter: Secondary | ICD-10-CM | POA: Diagnosis not present

## 2015-08-20 DIAGNOSIS — H04123 Dry eye syndrome of bilateral lacrimal glands: Secondary | ICD-10-CM | POA: Diagnosis not present

## 2015-09-08 DIAGNOSIS — H40043 Steroid responder, bilateral: Secondary | ICD-10-CM | POA: Diagnosis not present

## 2015-09-08 DIAGNOSIS — H16141 Punctate keratitis, right eye: Secondary | ICD-10-CM | POA: Diagnosis not present

## 2015-09-08 DIAGNOSIS — H26493 Other secondary cataract, bilateral: Secondary | ICD-10-CM | POA: Diagnosis not present

## 2015-09-08 DIAGNOSIS — H20023 Recurrent acute iridocyclitis, bilateral: Secondary | ICD-10-CM | POA: Diagnosis not present

## 2015-09-08 DIAGNOSIS — H40053 Ocular hypertension, bilateral: Secondary | ICD-10-CM | POA: Diagnosis not present

## 2015-09-22 DIAGNOSIS — H11423 Conjunctival edema, bilateral: Secondary | ICD-10-CM | POA: Diagnosis not present

## 2015-09-22 DIAGNOSIS — H40043 Steroid responder, bilateral: Secondary | ICD-10-CM | POA: Diagnosis not present

## 2015-09-22 DIAGNOSIS — H18413 Arcus senilis, bilateral: Secondary | ICD-10-CM | POA: Diagnosis not present

## 2015-09-22 DIAGNOSIS — H20023 Recurrent acute iridocyclitis, bilateral: Secondary | ICD-10-CM | POA: Diagnosis not present

## 2015-09-30 DIAGNOSIS — R5381 Other malaise: Secondary | ICD-10-CM | POA: Diagnosis not present

## 2015-09-30 DIAGNOSIS — Z79899 Other long term (current) drug therapy: Secondary | ICD-10-CM | POA: Diagnosis not present

## 2015-09-30 DIAGNOSIS — I7300 Raynaud's syndrome without gangrene: Secondary | ICD-10-CM | POA: Diagnosis not present

## 2015-09-30 DIAGNOSIS — M340 Progressive systemic sclerosis: Secondary | ICD-10-CM | POA: Diagnosis not present

## 2015-10-07 ENCOUNTER — Other Ambulatory Visit: Payer: Self-pay | Admitting: Family Medicine

## 2015-10-21 DIAGNOSIS — H04123 Dry eye syndrome of bilateral lacrimal glands: Secondary | ICD-10-CM | POA: Diagnosis not present

## 2015-10-21 DIAGNOSIS — H20023 Recurrent acute iridocyclitis, bilateral: Secondary | ICD-10-CM | POA: Diagnosis not present

## 2015-12-01 DIAGNOSIS — L940 Localized scleroderma [morphea]: Secondary | ICD-10-CM | POA: Diagnosis not present

## 2015-12-01 DIAGNOSIS — Z79899 Other long term (current) drug therapy: Secondary | ICD-10-CM | POA: Diagnosis not present

## 2016-01-19 DIAGNOSIS — H40043 Steroid responder, bilateral: Secondary | ICD-10-CM | POA: Diagnosis not present

## 2016-01-19 DIAGNOSIS — H11153 Pinguecula, bilateral: Secondary | ICD-10-CM | POA: Diagnosis not present

## 2016-03-30 DIAGNOSIS — M349 Systemic sclerosis, unspecified: Secondary | ICD-10-CM | POA: Diagnosis not present

## 2016-03-30 DIAGNOSIS — M170 Bilateral primary osteoarthritis of knee: Secondary | ICD-10-CM | POA: Diagnosis not present

## 2016-03-30 DIAGNOSIS — Z09 Encounter for follow-up examination after completed treatment for conditions other than malignant neoplasm: Secondary | ICD-10-CM | POA: Diagnosis not present

## 2016-04-04 ENCOUNTER — Other Ambulatory Visit: Payer: Self-pay | Admitting: Family Medicine

## 2016-04-05 ENCOUNTER — Telehealth (HOSPITAL_COMMUNITY): Payer: Self-pay | Admitting: *Deleted

## 2016-04-05 DIAGNOSIS — L940 Localized scleroderma [morphea]: Secondary | ICD-10-CM | POA: Diagnosis not present

## 2016-04-05 DIAGNOSIS — M349 Systemic sclerosis, unspecified: Secondary | ICD-10-CM

## 2016-04-05 DIAGNOSIS — Z79899 Other long term (current) drug therapy: Secondary | ICD-10-CM | POA: Diagnosis not present

## 2016-04-05 NOTE — Telephone Encounter (Signed)
Received new pt referral from Dr Estanislado Pandy, pt needs echo, pft and new pt appt for Scleroderma.  Orders placed will contact pt to schedule

## 2016-04-26 DIAGNOSIS — H401131 Primary open-angle glaucoma, bilateral, mild stage: Secondary | ICD-10-CM | POA: Diagnosis not present

## 2016-04-29 ENCOUNTER — Ambulatory Visit (HOSPITAL_COMMUNITY)
Admission: RE | Admit: 2016-04-29 | Discharge: 2016-04-29 | Disposition: A | Discharge status: Home / Self Care | Payer: Medicare Other | Source: Ambulatory Visit | Attending: Cardiology | Admitting: Cardiology

## 2016-04-29 DIAGNOSIS — M349 Systemic sclerosis, unspecified: Secondary | ICD-10-CM | POA: Insufficient documentation

## 2016-04-29 LAB — PULMONARY FUNCTION TEST
DL/VA % PRED: 79 %
DL/VA: 3.9 ml/min/mmHg/L
DLCO UNC % PRED: 64 %
DLCO unc: 16.47 ml/min/mmHg
FEF 25-75 PRE: 2.29 L/s
FEF 25-75 Post: 2.11 L/sec
FEF2575-%Change-Post: -7 %
FEF2575-%PRED-POST: 123 %
FEF2575-%Pred-Pre: 134 %
FEV1-%CHANGE-POST: -1 %
FEV1-%Pred-Post: 107 %
FEV1-%Pred-Pre: 109 %
FEV1-POST: 2.03 L
FEV1-Pre: 2.07 L
FEV1FVC-%CHANGE-POST: 3 %
FEV1FVC-%Pred-Pre: 106 %
FEV6-%Change-Post: -3 %
FEV6-%Pred-Post: 102 %
FEV6-%Pred-Pre: 106 %
FEV6-PRE: 2.49 L
FEV6-Post: 2.4 L
FEV6FVC-%Change-Post: 1 %
FEV6FVC-%PRED-PRE: 102 %
FEV6FVC-%Pred-Post: 104 %
FVC-%CHANGE-POST: -5 %
FVC-%PRED-POST: 98 %
FVC-%Pred-Pre: 104 %
FVC-Post: 2.4 L
FVC-Pre: 2.54 L
POST FEV1/FVC RATIO: 85 %
PRE FEV6/FVC RATIO: 98 %
Post FEV6/FVC ratio: 100 %
Pre FEV1/FVC ratio: 82 %
RV % PRED: 112 %
RV: 2.54 L
TLC % pred: 97 %
TLC: 5.05 L

## 2016-04-29 MED ORDER — ALBUTEROL SULFATE (2.5 MG/3ML) 0.083% IN NEBU
2.5000 mg | INHALATION_SOLUTION | Freq: Once | RESPIRATORY_TRACT | Status: AC
  Administered 2016-04-29: 2.5 mg via RESPIRATORY_TRACT

## 2016-04-30 DIAGNOSIS — H04123 Dry eye syndrome of bilateral lacrimal glands: Secondary | ICD-10-CM | POA: Diagnosis not present

## 2016-04-30 DIAGNOSIS — S0500XD Injury of conjunctiva and corneal abrasion without foreign body, unspecified eye, subsequent encounter: Secondary | ICD-10-CM | POA: Diagnosis not present

## 2016-04-30 DIAGNOSIS — H16223 Keratoconjunctivitis sicca, not specified as Sjogren's, bilateral: Secondary | ICD-10-CM | POA: Diagnosis not present

## 2016-04-30 DIAGNOSIS — H40023 Open angle with borderline findings, high risk, bilateral: Secondary | ICD-10-CM | POA: Diagnosis not present

## 2016-04-30 DIAGNOSIS — H16143 Punctate keratitis, bilateral: Secondary | ICD-10-CM | POA: Diagnosis not present

## 2016-05-03 DIAGNOSIS — H401131 Primary open-angle glaucoma, bilateral, mild stage: Secondary | ICD-10-CM | POA: Diagnosis not present

## 2016-05-17 DIAGNOSIS — H11423 Conjunctival edema, bilateral: Secondary | ICD-10-CM | POA: Diagnosis not present

## 2016-05-17 DIAGNOSIS — H5211 Myopia, right eye: Secondary | ICD-10-CM | POA: Diagnosis not present

## 2016-05-21 ENCOUNTER — Ambulatory Visit (HOSPITAL_COMMUNITY)
Admission: RE | Admit: 2016-05-21 | Discharge: 2016-05-21 | Disposition: A | Discharge status: Home / Self Care | Payer: Medicare Other | Source: Ambulatory Visit | Attending: Family Medicine | Admitting: Family Medicine

## 2016-05-21 ENCOUNTER — Ambulatory Visit (HOSPITAL_BASED_OUTPATIENT_CLINIC_OR_DEPARTMENT_OTHER)
Admission: RE | Admit: 2016-05-21 | Discharge: 2016-05-21 | Disposition: A | Discharge status: Home / Self Care | Payer: Medicare Other | Source: Ambulatory Visit | Attending: Internal Medicine | Admitting: Internal Medicine

## 2016-05-21 ENCOUNTER — Other Ambulatory Visit: Payer: Self-pay

## 2016-05-21 ENCOUNTER — Encounter (HOSPITAL_COMMUNITY): Payer: Self-pay | Admitting: Internal Medicine

## 2016-05-21 VITALS — BP 180/92 | HR 64 | Ht 65.0 in | Wt 179.4 lb

## 2016-05-21 DIAGNOSIS — Z8711 Personal history of peptic ulcer disease: Secondary | ICD-10-CM | POA: Diagnosis not present

## 2016-05-21 DIAGNOSIS — I350 Nonrheumatic aortic (valve) stenosis: Secondary | ICD-10-CM | POA: Insufficient documentation

## 2016-05-21 DIAGNOSIS — M349 Systemic sclerosis, unspecified: Secondary | ICD-10-CM

## 2016-05-21 DIAGNOSIS — E7800 Pure hypercholesterolemia, unspecified: Secondary | ICD-10-CM | POA: Insufficient documentation

## 2016-05-21 DIAGNOSIS — E785 Hyperlipidemia, unspecified: Secondary | ICD-10-CM | POA: Insufficient documentation

## 2016-05-21 DIAGNOSIS — M1990 Unspecified osteoarthritis, unspecified site: Secondary | ICD-10-CM | POA: Diagnosis not present

## 2016-05-21 DIAGNOSIS — Z79899 Other long term (current) drug therapy: Secondary | ICD-10-CM | POA: Diagnosis not present

## 2016-05-21 DIAGNOSIS — I10 Essential (primary) hypertension: Secondary | ICD-10-CM | POA: Diagnosis not present

## 2016-05-21 DIAGNOSIS — R942 Abnormal results of pulmonary function studies: Secondary | ICD-10-CM | POA: Diagnosis not present

## 2016-05-21 DIAGNOSIS — I209 Angina pectoris, unspecified: Secondary | ICD-10-CM | POA: Diagnosis not present

## 2016-05-21 DIAGNOSIS — I119 Hypertensive heart disease without heart failure: Secondary | ICD-10-CM | POA: Insufficient documentation

## 2016-05-21 HISTORY — DX: Systemic sclerosis, unspecified: M34.9

## 2016-05-21 LAB — BASIC METABOLIC PANEL WITH GFR
Anion gap: 8 (ref 5–15)
BUN: 7 mg/dL (ref 6–20)
CO2: 28 mmol/L (ref 22–32)
Calcium: 10.1 mg/dL (ref 8.9–10.3)
Chloride: 103 mmol/L (ref 101–111)
Creatinine, Ser: 0.77 mg/dL (ref 0.44–1.00)
GFR calc Af Amer: >60 mL/min
GFR calc non Af Amer: >60 mL/min
Glucose, Bld: 106 mg/dL — ABNORMAL HIGH (ref 65–99)
Potassium: 3.3 mmol/L — ABNORMAL LOW (ref 3.5–5.1)
Sodium: 139 mmol/L (ref 135–145)

## 2016-05-21 NOTE — Addendum Note (Signed)
Encounter addended by: Scarlette Calico, RN on: 05/21/2016 12:49 PM<BR>    Actions taken: Order Entry activity accessed, Diagnosis association updated

## 2016-05-21 NOTE — Patient Instructions (Signed)
CT Scan of chest  We will contact you in 1 year to schedule your next appointment.

## 2016-05-21 NOTE — Progress Notes (Signed)
Patient ID: Rhonda Buchanan, female   DOB: 22-Jun-1946, 70 y.o.   MRN: [id number]   [facility] CONSULT NOTE  Referring Physician: Dr. Kirke Corin Primary Care: Dr. Glo Herring   HPI:  Rhonda Buchanan is a 70 y/o woman with h/o lifelong HTN, scleroderma and morphia who is referred by Dr. Patrecia Pour for screening for [facility].  Denies any known h/o heart disease. Had a stress test in remote past which was negative. Formally diagnosed with scleroderma in 2014-2015. Has fatigue. Has h/o dizzy spells. Denies CP or SOB.  SBP usually 130. Never smoked.   Echo today EF 60-65% moderate LVH. Grade I DD. Very mild AS with fusion on non-coronary cusp. RV normal. No PH.   FEV1 2.07 (109%) FVC  2.54 (104%) DLCO 64%  Review of Systems: [y] = yes, [ ]  = no   General: Weight gain [ ] ; Weight loss [ ] ; Anorexia [ ] ; Fatigue Blue.Reese ]; Fever [ ] ; Chills [ ] ; Weakness [ ]   Cardiac: Chest pain/pressure [ ] ; Resting SOB [ ] ; Exertional SOB [ ] ; Orthopnea [ ] ; Pedal Edema [ ] ; Palpitations [ ] ; Syncope [ ] ; Presyncope [ ] ; Paroxysmal nocturnal dyspnea[ ]   Pulmonary: Cough [ ] ; Wheezing[ ] ; Hemoptysis[ ] ; Sputum [ ] ; Snoring [ ]   GI: Vomiting[ ] ; Dysphagia[ ] ; Melena[ ] ; Hematochezia [ ] ; Heartburn[ ] ; Abdominal pain [ ] ; Constipation [ ] ; Diarrhea [ ] ; BRBPR [ ]   GU: Hematuria[ ] ; Dysuria [ ] ; Nocturia[ ]   Vascular: Pain in legs with walking [ ] ; Pain in feet with lying flat [ ] ; Non-healing sores [ ] ; Stroke [ ] ; TIA [ ] ; Slurred speech [ ] ;  Neuro: Headaches[ ] ; Vertigo[ ] ; Seizures[ ] ; Paresthesias[ ] ;Blurred vision [ ] ; Diplopia [ ] ; Vision changes [ ]   Ortho/Skin: Arthritis [ y]; Joint pain [ y]; Muscle pain [ ] ; Joint swelling Blue.Reese ]; Back Pain [ ] ; Rash [ ]   Psych: Depression[ ] ; Anxiety[ ]   Heme: Bleeding problems [ ] ; Clotting disorders [ ] ; Anemia [ ]   Endocrine: Diabetes [ ] ; Thyroid dysfunction[ ]    Past Medical History:  Diagnosis Date  . Allergy   . Arthritis   . Hypercholesterolemia   .  Hypertension   . PUD (peptic ulcer disease)   . Scleroderma ([place])     Current Outpatient Prescriptions  Medication Sig Dispense Refill  . Brinzolamide-Brimonidine (SIMBRINZA) 1-0.2 % SUSP Apply to eye.    . calcium carbonate (OS-CAL) 600 MG TABS Take 600 mg by mouth daily.      . CRESTOR 40 MG tablet TAKE 1 TABLET BY MOUTH EVERY DAY 90 tablet 3  . folic acid (FOLVITE) 1 MG tablet Take 1 mg by mouth daily.    Marland Kitchen lisinopril-hydrochlorothiazide (PRINZIDE,ZESTORETIC) 10-12.5 MG per tablet Take 1 tablet by mouth daily. 90 tablet 3  . loratadine (CLARITIN) 10 MG tablet Take 10 mg by mouth daily.    . methotrexate (RHEUMATREX) 2.5 MG tablet Take 2.5 mg by mouth once a week. TAKE( 6 )15MG  ONCE A WEEK Caution:Chemotherapy. Protect from light.    . metoprolol succinate (TOPROL-XL) 50 MG 24 hr tablet TAKE 1/2 TABLET BY MOUTH DAILY 90 tablet 3  . multivitamin (THERAGRAN) per tablet Take 1 tablet by mouth daily.      . prednisoLONE acetate (PRED FORTE) 1 % ophthalmic suspension 1 drop 4 (four) times daily.    Marland Kitchen albuterol (PROVENTIL HFA;VENTOLIN HFA) 108 (90 BASE) MCG/ACT inhaler Inhale 2 puffs into the lungs every 6 (six)  hours as needed for wheezing or shortness of breath.     No current facility-administered medications for this encounter.     No Known Allergies    Social History   Social History  . Marital status: Divorced    Spouse name: N/A  . Number of children: N/A  . Years of education: N/A   Occupational History  . Not on file.   Social History Main Topics  . Smoking status: Never Smoker  . Smokeless tobacco: Never Used  . Alcohol use No  . Drug use: No  . Sexual activity: Not Currently   Other Topics Concern  . Not on file   Social History Narrative  . No narrative on file      Family History  Problem Relation Age of Onset  . Ulcers Sister   . Ulcers Brother   . Heart disease Brother 63    MI  . Cancer Maternal Grandmother   . Heart disease Maternal Grandfather     . Cancer Father     Vitals:   05/21/16 1114  BP: (!) 180/92  Pulse: 64  SpO2: 100%  Weight: 179 lb 6.4 oz (81.4 kg)  Height: 5\' 5"  (1.651 m)    PHYSICAL EXAM: General:  Well appearing. No respiratory difficulty HEENT: normal Neck: supple. no JVD. Carotids 2+ bilat; no bruits. No lymphadenopathy or thryomegaly appreciated. Cor: PMI nondisplaced. Regular rate & rhythm. No rubs, gallops 2/6 sem lusb  Lungs: clear Abdomen: soft, nontender, nondistended. No hepatosplenomegaly. No bruits or masses. Good bowel sounds. Extremities: no cyanosis, clubbing, rash, edema  Mild sclerodactyly Neuro: alert & oriented x 3, cranial nerves grossly intact. moves all 4 extremities w/o difficulty. Affect pleasant.  ECG: NSR 65 No ST-T wave abnormalities.    ASSESSMENT & PLAN: 1. Scleroderma  --I have reviewed echo and PFTs personally. There is no evidence of pulmonary HTN at this point but DLCO is mildly decreased which could be related to ILD. Will get hi-res CT of the chest to evaluate. Will need yearly echo and PFTs for continue screening.  2. HTN  --BP up today. Says it is usally well controlled. Dr. Redmond School managing. Can titrate lisinopril-HCTZ as needed 3. Very mild aortic stenosis  --follow with echo in 1 year  Rhonda Bernards,MD 11:56 AM

## 2016-05-21 NOTE — Progress Notes (Signed)
  Echocardiogram 2D Echocardiogram has been performed.  Rhonda Buchanan 05/21/2016, 10:53 AM

## 2016-05-21 NOTE — Addendum Note (Signed)
Encounter addended by: Scarlette Calico, RN on: 05/21/2016 12:03 PM<BR>    Actions taken: Order Entry activity accessed, Diagnosis association updated, Sign clinical note

## 2016-05-25 ENCOUNTER — Ambulatory Visit (HOSPITAL_COMMUNITY)
Admission: RE | Admit: 2016-05-25 | Discharge: 2016-05-25 | Disposition: A | Discharge status: Home / Self Care | Payer: Medicare Other | Source: Ambulatory Visit | Attending: Internal Medicine | Admitting: Internal Medicine

## 2016-05-25 DIAGNOSIS — I2510 Atherosclerotic heart disease of native coronary artery without angina pectoris: Secondary | ICD-10-CM | POA: Diagnosis not present

## 2016-05-25 DIAGNOSIS — R942 Abnormal results of pulmonary function studies: Secondary | ICD-10-CM | POA: Insufficient documentation

## 2016-05-25 DIAGNOSIS — I358 Other nonrheumatic aortic valve disorders: Secondary | ICD-10-CM | POA: Diagnosis not present

## 2016-05-25 DIAGNOSIS — I700 Atherosclerosis of aorta: Secondary | ICD-10-CM | POA: Insufficient documentation

## 2016-05-25 DIAGNOSIS — R0602 Shortness of breath: Secondary | ICD-10-CM | POA: Diagnosis not present

## 2016-05-25 DIAGNOSIS — M349 Systemic sclerosis, unspecified: Secondary | ICD-10-CM | POA: Insufficient documentation

## 2016-05-26 ENCOUNTER — Telehealth (HOSPITAL_COMMUNITY): Payer: Self-pay | Admitting: Cardiology

## 2016-05-26 MED ORDER — POTASSIUM CHLORIDE ER 10 MEQ PO TBCR: 20.0000 meq | EXTENDED_RELEASE_TABLET | Freq: Every day | ORAL | 3 refills | Status: DC

## 2016-05-26 NOTE — Telephone Encounter (Signed)
-----   Message from Jolaine Artist, MD sent at 05/22/2016 11:25 PM EDT ----- Start KCL 20 daily. Take 40 the first day

## 2016-05-26 NOTE — Telephone Encounter (Signed)
Patient aware and voiced understanding

## 2016-08-03 ENCOUNTER — Other Ambulatory Visit: Payer: Self-pay | Admitting: Family Medicine

## 2016-08-17 DIAGNOSIS — H40013 Open angle with borderline findings, low risk, bilateral: Secondary | ICD-10-CM | POA: Diagnosis not present

## 2016-08-17 DIAGNOSIS — Z961 Presence of intraocular lens: Secondary | ICD-10-CM | POA: Diagnosis not present

## 2016-08-31 ENCOUNTER — Telehealth: Payer: Self-pay | Admitting: Family Medicine

## 2016-08-31 ENCOUNTER — Other Ambulatory Visit: Payer: Self-pay

## 2016-08-31 MED ORDER — ROSUVASTATIN CALCIUM 40 MG PO TABS: 40.0000 mg | ORAL_TABLET | Freq: Every day | ORAL | 0 refills | Status: DC

## 2016-08-31 NOTE — Telephone Encounter (Signed)
Pt made CPE appt. Requesting 90 day refill on Crestor since she is out now. Per pt, she must get 90 days and pharmacy normally gives her generic Crestor so that is what she need for insurance purposes as well

## 2016-08-31 NOTE — Telephone Encounter (Signed)
crestor sent in 

## 2016-09-01 DIAGNOSIS — Z1231 Encounter for screening mammogram for malignant neoplasm of breast: Secondary | ICD-10-CM | POA: Diagnosis not present

## 2016-09-01 LAB — HM MAMMOGRAPHY

## 2016-09-02 ENCOUNTER — Encounter: Payer: Self-pay | Admitting: Family Medicine

## 2016-09-20 NOTE — Progress Notes (Signed)
Office Visit Note  Patient: Rhonda Buchanan             Date of Birth: 1946-01-09           MRN: [id number]             PCP: Wyatt Haste, MD Referring: Denita Lung, MD Visit Date: 09/21/2016 Occupation: @GUAROCC @    Subjective:  Other of the Left Thumb (Reports thumb has a burning sensation) and Follow-up   History of Present Illness: Rhonda Buchanan is a 70 y.o. female  Since the last visit, patient has had a breakout in approximately July/ August 2017 to her back. 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 She has seen dr. Otho Ket who looked at the rash in the past and he told patient that there has been scars that remain after the break out. Currently the patient is doing well and does not have new rash but I will try to take a picture of the back so we can have something is comparison for future breakout.  Patient has not missed any doses of her medication which includes methotrexate 6 per week prescribed by [facility] Dr. Otho Ket  Patient also has been instructed to take folic acid 3 per day by her [facility] doctor.  She has seen Dr. Haroldine Laws and his notes can be found in Epic. Please also see the bottom of this chart for details from his visit.  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   Activities of Daily Living:  Patient reports morning stiffness for 30 minutes.   Patient Denies nocturnal pain.  Difficulty dressing/grooming: Denies Difficulty climbing stairs: Denies Difficulty getting out of chair: Denies Difficulty using hands for taps, buttons, cutlery, and/or writing: Denies   Review of Systems  Constitutional: Negative for fatigue.  HENT: Negative for mouth sores and mouth dryness.   Eyes: Negative for dryness.  Respiratory: Negative for shortness of breath.   Gastrointestinal: Negative for constipation and  diarrhea.  Musculoskeletal: Negative for myalgias and myalgias.  Skin: Positive for rash (to lower back, morphea, scleroderma). Negative for sensitivity to sunlight.  Psychiatric/Behavioral: Negative for decreased concentration and sleep disturbance.    PMFS History:  Patient Active Problem List   Diagnosis Date Noted  . Aortic stenosis 05/21/2016  . Abnormal PFTs 05/21/2016  . Asthma due to environmental allergies 06/24/2015  . 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 Scleroderma ([place]) 07/12/2013  . Hypertension 03/04/2011  . Hyperlipidemia 03/04/2011  . Allergic rhinitis 03/04/2011  . Arthritis 03/04/2011  . Migraine variant 04/18/1995    Past Medical History:  Diagnosis Date  . Allergy   . Arthritis   . Hypercholesterolemia   . Hypertension   . PUD (peptic ulcer disease)   . Scleroderma ([place])     Family History  Problem Relation Age of Onset  . Ulcers Brother   . Heart disease Brother 61    MI  . Cancer Father   . Ulcers Sister   . Cancer Maternal Grandmother   . Heart disease Maternal Grandfather    Past Surgical History:  Procedure Laterality Date  . arthroscopy    . CYST REMOVAL HAND     Social History   Social History Narrative  . No narrative on file  Objective: Vital Signs: BP 119/75 (BP Location: Left Arm, Patient Position: Sitting, Cuff Size: Normal)   Pulse 75   Resp 14   Ht 5\' 6"  (1.676 m)   Wt 181 lb (82.1 kg)   LMP 02/16/2003   BMI 29.21 kg/m    Physical Exam  Constitutional: She is oriented to person, place, and time. She appears well-developed and well-nourished.  HENT:  Head: Normocephalic and atraumatic.  Eyes: EOM are normal. Pupils are equal, round, and reactive to light.  Cardiovascular: Normal rate, regular rhythm and normal heart sounds.  Exam reveals no gallop and no friction rub.   No murmur heard. Pulmonary/Chest: Effort normal and breath sounds normal. She has no  wheezes. She has no rales.  Abdominal: Soft. Bowel sounds are normal. She exhibits no distension. There is no tenderness. There is no guarding. No hernia.  Musculoskeletal: Normal range of motion. She exhibits no edema, tenderness or deformity.  Lymphadenopathy:    She has no cervical adenopathy.  Neurological: She is alert and oriented to person, place, and time. Coordination normal.  Skin: Skin is warm and dry. Capillary refill takes less than 2 seconds. No rash noted.  Psychiatric: She has a normal mood and affect. Her behavior is normal.     Musculoskeletal Exam:  Full range of motion of all joints Grip strength is equal and strong bilaterally Fiber myalgia tender points are all absent  CDAI Exam: No CDAI exam completed.    Investigation: Findings:  +TB testing treated in the 1960's at [place]: No results found.    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  Speciality Comments: No specialty comments available.    Procedures:  No procedures performed Allergies: Patient has no known allergies.   Assessment / Plan:     Visit Diagnoses: Scleroderma ([place])  Positive PPD, treated  Recurrent acute iridocyclitis of both eyes  Thumb pain, left   Left thumb pain at 1st mcp. Will try voltaren gel.    =============== High resolution CT done in August 2017 at [facility] shows the following. Please see actual report for full details found in Epic. Patient will ask Dr. been some on to review this with her over the phone if indicated if there is anything to worry about. Otherwise she will see him on annual basis. IMPRESSION: 1. No evidence of interstitial lung disease. 2. No evidence of achalasia. 3. No acute findings in the thorax. 4. Aortic atherosclerosis, in addition to left main and 3 vessel coronary artery disease. Please note that although the presence of coronary artery calcium documents  the presence of coronary artery disease, the severity of this disease and any potential stenosis cannot be assessed on this non-gated CT examination. Assessment for potential risk factor modification, dietary therapy or pharmacologic therapy may be warranted, if clinically indicated. 5. There are calcifications of the aortic valve. Echocardiographic correlation for evaluation of potential valvular dysfunction may be warranted if clinically indicated.   Electronically Signed   By: Vinnie Langton M.D.   On: 05/25/2016 17:07  =============== ASSESSMENT & PLAN from dr. Haroldine Laws from aug 2017 visit.  See epic for full note: 1. Scleroderma  --I have reviewed echo and PFTs personally. There is no evidence of pulmonary HTN at this point but DLCO is mildly decreased which could be related to ILD. Will get hi-res CT of the chest to evaluate. Will need yearly echo and PFTs for continue screening.  2. HTN  --BP up today. Says it is usally well controlled. Dr. Redmond School managing. Can titrate lisinopril-HCTZ as needed 3. Very mild aortic stenosis  --follow with echo in 1 year  Bensimhon, Daniel,MD 11:56 AM  ============== Orders: No orders of the defined types were placed in this encounter.  Meds ordered this encounter  Medications  . diclofenac sodium (VOLTAREN) 1 % GEL    Sig: Voltaren Gel 3 grams to 3 large joints upto TID 3 TUBES with 3 refills    Dispense:  3 Tube    Refill:  3    Voltaren Gel 3 grams to 3 large joints upto TID 3 TUBES with 3 refills    Order Specific Question:   Supervising Provider    Answer:   Lyda Perone    Face-to-face time spent with patient was 30 minutes. 50% of time was spent in counseling and coordination of care.  Follow-Up Instructions: Return in about 6 months (around 03/22/2017), or scleroderma,mtx 6/week,oa kj,.   Eliezer Lofts, PA-C    I examined and evaluated the patient with Eliezer Lofts PA. The plan of care was discussed as  noted above.  Bo Merino, MD

## 2016-09-21 ENCOUNTER — Encounter: Payer: Self-pay | Admitting: Rheumatology

## 2016-09-21 ENCOUNTER — Ambulatory Visit (INDEPENDENT_AMBULATORY_CARE_PROVIDER_SITE_OTHER): Payer: Medicare Other | Admitting: Rheumatology

## 2016-09-21 VITALS — BP 119/75 | HR 75 | Resp 14 | Ht 66.0 in | Wt 181.0 lb

## 2016-09-21 DIAGNOSIS — R7611 Nonspecific reaction to tuberculin skin test without active tuberculosis: Secondary | ICD-10-CM | POA: Diagnosis not present

## 2016-09-21 DIAGNOSIS — H20023 Recurrent acute iridocyclitis, bilateral: Secondary | ICD-10-CM

## 2016-09-21 DIAGNOSIS — M79645 Pain in left finger(s): Secondary | ICD-10-CM | POA: Diagnosis not present

## 2016-09-21 DIAGNOSIS — M349 Systemic sclerosis, unspecified: Secondary | ICD-10-CM | POA: Diagnosis not present

## 2016-09-21 MED ORDER — DICLOFENAC SODIUM 1 % TD GEL: TRANSDERMAL | 3 refills | Status: DC

## 2016-09-23 ENCOUNTER — Other Ambulatory Visit: Payer: Self-pay | Admitting: Family Medicine

## 2016-09-27 DIAGNOSIS — L940 Localized scleroderma [morphea]: Secondary | ICD-10-CM | POA: Diagnosis not present

## 2016-10-05 ENCOUNTER — Ambulatory Visit (INDEPENDENT_AMBULATORY_CARE_PROVIDER_SITE_OTHER): Payer: Medicare Other | Admitting: Family Medicine

## 2016-10-05 ENCOUNTER — Encounter: Payer: Self-pay | Admitting: Family Medicine

## 2016-10-05 VITALS — BP 118/70 | HR 70 | Ht 66.0 in | Wt 181.0 lb

## 2016-10-05 DIAGNOSIS — I2510 Atherosclerotic heart disease of native coronary artery without angina pectoris: Secondary | ICD-10-CM | POA: Diagnosis not present

## 2016-10-05 DIAGNOSIS — I10 Essential (primary) hypertension: Secondary | ICD-10-CM

## 2016-10-05 DIAGNOSIS — J301 Allergic rhinitis due to pollen: Secondary | ICD-10-CM | POA: Diagnosis not present

## 2016-10-05 DIAGNOSIS — Z23 Encounter for immunization: Secondary | ICD-10-CM

## 2016-10-05 DIAGNOSIS — M349 Systemic sclerosis, unspecified: Secondary | ICD-10-CM | POA: Diagnosis not present

## 2016-10-05 DIAGNOSIS — H20023 Recurrent acute iridocyclitis, bilateral: Secondary | ICD-10-CM | POA: Diagnosis not present

## 2016-10-05 DIAGNOSIS — M1990 Unspecified osteoarthritis, unspecified site: Secondary | ICD-10-CM | POA: Diagnosis not present

## 2016-10-05 DIAGNOSIS — Z6379 Other stressful life events affecting family and household: Secondary | ICD-10-CM

## 2016-10-05 DIAGNOSIS — Z0000 Encounter for general adult medical examination without abnormal findings: Secondary | ICD-10-CM | POA: Diagnosis not present

## 2016-10-05 DIAGNOSIS — E785 Hyperlipidemia, unspecified: Secondary | ICD-10-CM

## 2016-10-05 DIAGNOSIS — J45909 Unspecified asthma, uncomplicated: Secondary | ICD-10-CM

## 2016-10-05 LAB — CBC WITH DIFFERENTIAL/PLATELET
Basophils Absolute: 37 cells/uL (ref 0–200)
Basophils Relative: 1 %
EOS PCT: 4 %
Eosinophils Absolute: 148 cells/uL (ref 15–500)
HCT: 41 % (ref 35.0–45.0)
HEMOGLOBIN: 14.1 g/dL (ref 11.7–15.5)
LYMPHS ABS: 1628 {cells}/uL (ref 850–3900)
Lymphocytes Relative: 44 %
MCH: 31.3 pg (ref 27.0–33.0)
MCHC: 34.4 g/dL (ref 32.0–36.0)
MCV: 91.1 fL (ref 80.0–100.0)
MONOS PCT: 11 %
MPV: 10.7 fL (ref 7.5–12.5)
Monocytes Absolute: 407 cells/uL (ref 200–950)
NEUTROS ABS: 1480 {cells}/uL — AB (ref 1500–7800)
Neutrophils Relative %: 40 %
PLATELETS: 226 10*3/uL (ref 140–400)
RBC: 4.5 MIL/uL (ref 3.80–5.10)
RDW: 14.2 % (ref 11.0–15.0)
WBC: 3.7 10*3/uL — AB (ref 4.0–10.5)

## 2016-10-05 NOTE — Progress Notes (Signed)
Subjective:   HPI  Rhonda Buchanan is a 70 y.o. female who presents for a complete physical and annual wellness check.  Medical care team includes: deveshaur rhum. Bensimhon Cardio  [facility] derm.  Preventative care: Last ophthalmology visit: every 3 months 08/18/16 Last dental visit: NA Last colonoscopy:07/17/08 Last mammogram:09/01/16 Last gynecological exam:07/02/08 Last EKG8/4/17 Last labs:06/24/15  Prior vaccinations: TD or Tdap:01/31/07 Influenza:10/05/16 Pneumococcal:01/31/07 13:05/20/14 Shingles/Zostavax:07/02/08  Advanced directive:No. Information given. Concerns: She does have hypertension and continues on lisinopril/HCTZ. She is followed for her scleroderma by dermatology in [place]. Presently she is on MTX for this. She also has seen rheumatology for this. Her allergies seem to be under good control. She does have irritable psychiatrist and is being followed by her optometrist. She rarely has difficulty with her asthma. Her arthritis gets her very little difficulty. She does use Voltaren for that. She continues on Crestor without difficulty. She is also followed by cardiology but presently has had no chest pain, shortness of breath, PND or DOE. She has underlying stress of having to take care of her sister.  Reviewed their medical, surgical, family, social, medication, and allergy history and updated chart as appropriate.    Review of Systems Negative except as above    Objective:   Physical Exam   General appearance: alert, no distress, WD/WN,  Skin: Deferred to dermatology HEENT: normocephalic, conjunctiva/corneas normal, sclerae anicteric, PERRLA, EOMi, nares patent, no discharge or erythema, pharynx normal Oral cavity: MMM, tongue normal, teeth normal Neck: supple, no lymphadenopathy, no thyromegaly, no masses, normal ROM Chest: non tender, normal shape and expansion Heart: RRR, normal S1, S2, no murmurs Lungs: CTA bilaterally, no wheezes, rhonchi, or rales Abdomen:  +bs, soft, non tender, non distended, no masses, no hepatomegaly, no splenomegaly, no bruits Back: non tender, normal ROM, no scoliosis Musculoskeletal: upper extremities non tender, no obvious deformity, normal ROM throughout, lower extremities non tender, no obvious deformity, normal ROM throughout Extremities: no edema, no cyanosis, no clubbing Pulses: 2+ symmetric, upper and lower extremities, normal cap refill Neurological: alert, oriented x 3, CN2-12 intact, strength normal upper extremities and lower extremities, sensation normal throughout, DTRs 2+ throughout, no cerebellar signs, gait normal Psychiatric: normal affect, behavior normal, pleasant     Assessment and Plan :   Routine general medical examination at a health care facility - Plan: CBC with Differential/Platelet, Comprehensive metabolic panel, Lipid panel  Need for prophylactic vaccination and inoculation against influenza - Plan: Flu vaccine HIGH DOSE PF (Fluzone High dose)  Essential hypertension - Plan: CBC with Differential/Platelet, Comprehensive metabolic panel  Hyperlipidemia, unspecified hyperlipidemia type - Plan: Lipid panel  Scleroderma (HCC)  Chronic allergic rhinitis due to pollen, unspecified seasonality  Recurrent acute iridocyclitis of both eyes  Asthma due to environmental allergies  Arthritis  Atherosclerosis of coronary artery of native heart without angina pectoris, unspecified vessel or lesion type  Stress due to illness of family member Overall she is doing quite well considering everything that she is going through. Continue to be followed by her specialist and I will get her in to see an ophthalmologist for follow-up on the iridocyclitis. She will continue on her present medications. Flu shot given today    Physical exam - discussed healthy lifestyle, diet, exercise, preventative care, vaccinations, and addressed their concerns.    Follow-up as needed

## 2016-10-06 LAB — COMPREHENSIVE METABOLIC PANEL
ALBUMIN: 4.2 g/dL (ref 3.6–5.1)
ALT: 23 U/L (ref 6–29)
AST: 25 U/L (ref 10–35)
Alkaline Phosphatase: 66 U/L (ref 33–130)
BILIRUBIN TOTAL: 0.8 mg/dL (ref 0.2–1.2)
BUN: 11 mg/dL (ref 7–25)
CO2: 25 mmol/L (ref 20–31)
CREATININE: 0.99 mg/dL — AB (ref 0.60–0.93)
Calcium: 9.2 mg/dL (ref 8.6–10.4)
Chloride: 105 mmol/L (ref 98–110)
Glucose, Bld: 108 mg/dL — ABNORMAL HIGH (ref 65–99)
Potassium: 3.6 mmol/L (ref 3.5–5.3)
SODIUM: 140 mmol/L (ref 135–146)
Total Protein: 7 g/dL (ref 6.1–8.1)

## 2016-10-06 LAB — LIPID PANEL
Cholesterol: 213 mg/dL — ABNORMAL HIGH (ref <200)
HDL: 65 mg/dL (ref >50)
LDL CALC: 132 mg/dL — AB (ref <100)
TRIGLYCERIDES: 79 mg/dL (ref <150)
Total CHOL/HDL Ratio: 3.3 Ratio (ref <5.0)
VLDL: 16 mg/dL (ref <30)

## 2016-10-17 ENCOUNTER — Other Ambulatory Visit (HOSPITAL_COMMUNITY): Payer: Self-pay | Admitting: Internal Medicine

## 2016-11-01 ENCOUNTER — Other Ambulatory Visit: Payer: Self-pay | Admitting: Family Medicine

## 2016-12-15 DIAGNOSIS — H40023 Open angle with borderline findings, high risk, bilateral: Secondary | ICD-10-CM | POA: Diagnosis not present

## 2016-12-15 DIAGNOSIS — H11153 Pinguecula, bilateral: Secondary | ICD-10-CM | POA: Diagnosis not present

## 2016-12-15 DIAGNOSIS — H40043 Steroid responder, bilateral: Secondary | ICD-10-CM | POA: Diagnosis not present

## 2016-12-15 DIAGNOSIS — H04123 Dry eye syndrome of bilateral lacrimal glands: Secondary | ICD-10-CM | POA: Diagnosis not present

## 2016-12-15 DIAGNOSIS — H11423 Conjunctival edema, bilateral: Secondary | ICD-10-CM | POA: Diagnosis not present

## 2017-01-10 DIAGNOSIS — H209 Unspecified iridocyclitis: Secondary | ICD-10-CM | POA: Diagnosis not present

## 2017-01-10 DIAGNOSIS — Z79899 Other long term (current) drug therapy: Secondary | ICD-10-CM | POA: Diagnosis not present

## 2017-01-10 DIAGNOSIS — L940 Localized scleroderma [morphea]: Secondary | ICD-10-CM | POA: Diagnosis not present

## 2017-03-15 DIAGNOSIS — H40023 Open angle with borderline findings, high risk, bilateral: Secondary | ICD-10-CM | POA: Diagnosis not present

## 2017-03-15 DIAGNOSIS — H20022 Recurrent acute iridocyclitis, left eye: Secondary | ICD-10-CM | POA: Diagnosis not present

## 2017-03-15 DIAGNOSIS — H18413 Arcus senilis, bilateral: Secondary | ICD-10-CM | POA: Diagnosis not present

## 2017-03-15 DIAGNOSIS — H40053 Ocular hypertension, bilateral: Secondary | ICD-10-CM | POA: Diagnosis not present

## 2017-03-15 DIAGNOSIS — H40043 Steroid responder, bilateral: Secondary | ICD-10-CM | POA: Diagnosis not present

## 2017-03-21 DIAGNOSIS — H40043 Steroid responder, bilateral: Secondary | ICD-10-CM | POA: Diagnosis not present

## 2017-03-21 DIAGNOSIS — H20023 Recurrent acute iridocyclitis, bilateral: Secondary | ICD-10-CM | POA: Diagnosis not present

## 2017-03-21 DIAGNOSIS — H40053 Ocular hypertension, bilateral: Secondary | ICD-10-CM | POA: Diagnosis not present

## 2017-03-21 DIAGNOSIS — Z961 Presence of intraocular lens: Secondary | ICD-10-CM | POA: Diagnosis not present

## 2017-03-21 DIAGNOSIS — H04123 Dry eye syndrome of bilateral lacrimal glands: Secondary | ICD-10-CM | POA: Diagnosis not present

## 2017-03-22 ENCOUNTER — Ambulatory Visit: Payer: Medicare Other | Admitting: Rheumatology

## 2017-03-22 NOTE — Progress Notes (Deleted)
Office Visit Note  Patient: Rhonda Buchanan             Date of Birth: 08-30-1946           MRN: [id number]             PCP: Denita Lung, MD Referring: Denita Lung, MD Visit Date: 03/22/2017 Occupation: @GUAROCC @    Subjective:  No chief complaint on file.   History of Present Illness: Rhonda Buchanan is a 71 y.o. female  Last seen 09/27/2016   Aug 2016 ==]>  TB GOLD POSITIVE. Pt is getting MTX 6/week from Dr. Edison Simon (at [facility] We cannot Rx till we have documentation from [facility] that she has been treated.   Activities of Daily Living:  Patient reports morning stiffness for *** {minute/hour:19697}.   Patient {ACTIONS;DENIES/REPORTS:21021675::"Denies"} nocturnal pain.  Difficulty dressing/grooming: {ACTIONS;DENIES/REPORTS:21021675::"Denies"} Difficulty climbing stairs: {ACTIONS;DENIES/REPORTS:21021675::"Denies"} Difficulty getting out of chair: {ACTIONS;DENIES/REPORTS:21021675::"Denies"} Difficulty using hands for taps, buttons, cutlery, and/or writing: {ACTIONS;DENIES/REPORTS:21021675::"Denies"}   Review of Systems  Constitutional: Negative for fatigue.  HENT: Negative for mouth sores and mouth dryness.   Eyes: Negative for dryness.  Respiratory: Negative for shortness of breath.   Gastrointestinal: Negative for constipation and diarrhea.  Musculoskeletal: Negative for myalgias and myalgias.  Skin: Negative for sensitivity to sunlight.  Psychiatric/Behavioral: Negative for decreased concentration and sleep disturbance.    PMFS History:  Patient Active Problem List   Diagnosis Date Noted  . Atherosclerosis of coronary artery of native heart without angina pectoris 10/05/2016  . Aortic stenosis 05/21/2016  . Abnormal PFTs 05/21/2016  . Asthma due to environmental allergies 06/24/2015  . 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 Scleroderma ([place]) 07/12/2013  . Hypertension  03/04/2011  . Hyperlipidemia 03/04/2011  . Allergic rhinitis 03/04/2011  . Arthritis 03/04/2011  . Migraine variant 04/18/1995    Past Medical History:  Diagnosis Date  . Allergy   . Arthritis   . Hypercholesterolemia   . Hypertension   . PUD (peptic ulcer disease)   . Scleroderma ([place])     Family History  Problem Relation Age of Onset  . Ulcers Brother   . Heart disease Brother 70       MI  . Cancer Father   . Ulcers Sister   . Cancer Maternal Grandmother   . Heart disease Maternal Grandfather    Past Surgical History:  Procedure Laterality Date  . arthroscopy    . CYST REMOVAL HAND     Social History   Social History Narrative  . No narrative on file     Objective: Vital Signs: LMP 02/16/2003    Physical Exam  Constitutional: She is oriented to person, place, and time. She appears well-developed and well-nourished.  HENT:  Head: Normocephalic and atraumatic.  Eyes: EOM are normal. Pupils are equal, round, and reactive to light.  Cardiovascular: Normal rate, regular rhythm and normal heart sounds.  Exam reveals no gallop and no friction rub.   No murmur heard. Pulmonary/Chest: Effort normal and breath sounds normal. She has no wheezes. She has no rales.  Abdominal: Soft. Bowel sounds are normal. She exhibits no distension. There is no tenderness. There is no guarding. No hernia.  Musculoskeletal: Normal range of motion. She exhibits no edema, tenderness or deformity.  Lymphadenopathy:    She has no cervical adenopathy.  Neurological: She is alert and oriented to person, place, and time. Coordination normal.  Skin: Skin  is warm and dry. Capillary refill takes less than 2 seconds. No rash noted.  Psychiatric: She has a normal mood and affect. Her behavior is normal.  Nursing note and vitals reviewed.    Musculoskeletal Exam: ***  CDAI Exam: CDAI Homunculus Exam:   Joint Counts:  CDAI Tender Joint count: 0 CDAI Swollen Joint count:  0     Investigation: No additional findings. Office Visit on 10/05/2016  Component Date Value Ref Range Status  . WBC 10/05/2016 3.7* 4.0 - 10.5 K/uL Final  . RBC 10/05/2016 4.50  3.80 - 5.10 MIL/uL Final  . Hemoglobin 10/05/2016 14.1  11.7 - 15.5 g/dL Final  . HCT 10/05/2016 41.0  35.0 - 45.0 % Final  . MCV 10/05/2016 91.1  80.0 - 100.0 fL Final  . MCH 10/05/2016 31.3  27.0 - 33.0 pg Final  . MCHC 10/05/2016 34.4  32.0 - 36.0 g/dL Final  . RDW 10/05/2016 14.2  11.0 - 15.0 % Final  . Platelets 10/05/2016 226  140 - 400 K/uL Final  . MPV 10/05/2016 10.7  7.5 - 12.5 fL Final  . Neutro Abs 10/05/2016 1480* 1,500 - 7,800 cells/uL Final  . Lymphs Abs 10/05/2016 1628  850 - 3,900 cells/uL Final  . Monocytes Absolute 10/05/2016 407  200 - 950 cells/uL Final  . Eosinophils Absolute 10/05/2016 148  15 - 500 cells/uL Final  . Basophils Absolute 10/05/2016 37  0 - 200 cells/uL Final  . Neutrophils Relative % 10/05/2016 40  % Final  . Lymphocytes Relative 10/05/2016 44  % Final  . Monocytes Relative 10/05/2016 11  % Final  . Eosinophils Relative 10/05/2016 4  % Final  . Basophils Relative 10/05/2016 1  % Final  . Smear Review 10/05/2016 Criteria for review not met   Final  . Sodium 10/05/2016 140  135 - 146 mmol/L Final  . Potassium 10/05/2016 3.6  3.5 - 5.3 mmol/L Final  . Chloride 10/05/2016 105  98 - 110 mmol/L Final  . CO2 10/05/2016 25  20 - 31 mmol/L Final  . Glucose, Bld 10/05/2016 108* 65 - 99 mg/dL Final  . BUN 10/05/2016 11  7 - 25 mg/dL Final  . Creat 10/05/2016 0.99* 0.60 - 0.93 mg/dL Final   Comment:   For patients > or = 72 years of age: The upper reference limit for Creatinine is approximately 13% higher for people identified as African-American.     . Total Bilirubin 10/05/2016 0.8  0.2 - 1.2 mg/dL Final  . Alkaline Phosphatase 10/05/2016 66  33 - 130 U/L Final  . AST 10/05/2016 25  10 - 35 U/L Final  . ALT 10/05/2016 23  6 - 29 U/L Final  . Total Protein 10/05/2016  7.0  6.1 - 8.1 g/dL Final  . Albumin 10/05/2016 4.2  3.6 - 5.1 g/dL Final  . Calcium 10/05/2016 9.2  8.6 - 10.4 mg/dL Final  . Cholesterol 10/05/2016 213* <200 mg/dL Final  . Triglycerides 10/05/2016 79  <150 mg/dL Final  . HDL 10/05/2016 65  >50 mg/dL Final  . Total CHOL/HDL Ratio 10/05/2016 3.3  <5.0 Ratio Final  . VLDL 10/05/2016 16  <30 mg/dL Final  . LDL Cholesterol 10/05/2016 132* <100 mg/dL Final     Imaging: No results found.  Speciality Comments: No specialty comments available.    Procedures:  No procedures performed Allergies: Patient has no known allergies.   Assessment / Plan:     Visit Diagnoses: Scleroderma ([place])  Recurrent acute iridocyclitis of both eyes  Orders: No orders of the defined types were placed in this encounter.  No orders of the defined types were placed in this encounter.   Face-to-face time spent with patient was 30 minutes. 50% of time was spent in counseling and coordination of care.  Follow-Up Instructions: No Follow-up on file.   Eliezer Lofts, PA-C  Note - This record has been created using [employer].  Chart creation errors have been sought, but may not always  have been located. Such creation errors do not reflect on  the standard of medical care.

## 2017-04-01 ENCOUNTER — Ambulatory Visit (INDEPENDENT_AMBULATORY_CARE_PROVIDER_SITE_OTHER): Payer: Medicare Other | Admitting: Medical

## 2017-04-01 VITALS — BP 118/70 | HR 71 | Wt 184.6 lb

## 2017-04-01 DIAGNOSIS — R079 Chest pain, unspecified: Secondary | ICD-10-CM

## 2017-04-01 DIAGNOSIS — R0981 Nasal congestion: Secondary | ICD-10-CM | POA: Diagnosis not present

## 2017-04-01 DIAGNOSIS — G4489 Other headache syndrome: Secondary | ICD-10-CM | POA: Diagnosis not present

## 2017-04-01 DIAGNOSIS — R519 Headache, unspecified: Secondary | ICD-10-CM

## 2017-04-01 DIAGNOSIS — R51 Headache: Secondary | ICD-10-CM | POA: Diagnosis not present

## 2017-04-01 MED ORDER — CLARITHROMYCIN 500 MG PO TABS: 500.0000 mg | ORAL_TABLET | Freq: Two times a day (BID) | ORAL | 0 refills | Status: DC

## 2017-04-01 MED ORDER — AMOXICILLIN 500 MG PO TABS: ORAL_TABLET | ORAL | 0 refills | Status: DC

## 2017-04-01 NOTE — Progress Notes (Signed)
Subjective: Chief Complaint  Patient presents with  . Chest Pain    chest pain  x2 weeks ago    Been having some sharp pains in left upper chest.  Pains last seconds, intermittent.   Last night had a less intense similar pain as the left upper chest pains she has been having.  Denies associated SOB, sweats, dizziness.  Can have several days in between the chest pains.  Few weeks was standing in a store, had sudden onset of nausea, sweats.  But no CP or SOB that day.  Lately been getting headaches, mostly frontal.  Has had thick mucous, but lots of it. Was taking Claritin but this dried her eyes out too much.  Has some nasal congestion, swollen nostrils.  No fever.  No change in routine, no heavy caffeine use, no head injury, not particularly stressed.  No other aggravating or relieving factors. No other complaint.   Past Medical History:  Diagnosis Date  . Allergy   . Arthritis   . Hypercholesterolemia   . Hypertension   . PUD (peptic ulcer disease)   . Scleroderma [facility])    Current Outpatient Prescriptions on File Prior to Visit  Medication Sig Dispense Refill  . calcium carbonate (OS-CAL) 600 MG TABS Take 600 mg by mouth daily.      . folic acid (FOLVITE) 1 MG tablet Take 1 mg by mouth daily.    Marland Kitchen lisinopril-hydrochlorothiazide (PRINZIDE,ZESTORETIC) 10-12.5 MG tablet TAKE 1 TABLET BY MOUTH EVERY DAY 90 tablet 3  . methotrexate (RHEUMATREX) 2.5 MG tablet Take 2.5 mg by mouth once a week. TAKE( 6 )15MG  ONCE A WEEK Caution:Chemotherapy. Protect from light.    . metoprolol succinate (TOPROL-XL) 50 MG 24 hr tablet TAKE 1/2 TABLET BY MOUTH DAILY 90 tablet 3  . multivitamin (THERAGRAN) per tablet Take 1 tablet by mouth daily.      Marland Kitchen nystatin (MYCOSTATIN/NYSTOP) powder Apply 1 application topically 4 (four) times daily as needed.    . potassium chloride (KLOR-CON 10) 10 MEQ tablet Take 2 tablets (20 mEq total) by mouth daily. 30 tablet 3  . prednisoLONE acetate (PRED FORTE) 1 % ophthalmic  suspension Place 1 drop into both eyes daily.     . rosuvastatin (CRESTOR) 40 MG tablet Take 1 tablet (40 mg total) by mouth daily. 90 tablet 0  . timolol (BETIMOL) 0.5 % ophthalmic solution Place 1 drop into both eyes daily.    . Brinzolamide-Brimonidine (SIMBRINZA) 1-0.2 % SUSP Apply 1 drop to eye 3 (three) times daily.     Marland Kitchen loratadine (CLARITIN) 10 MG tablet Take 10 mg by mouth daily.     No current facility-administered medications on file prior to visit.    Review of Systems Cardiology:  -palpitations, -edema Respiratory: -cough, -shortness of breath, -wheezing Gastroenterology: -abdominal pain, -nausea, -vomiting, -diarrhea, -constipation  Hematology: -bleeding or bruising problems Musculoskeletal: -arthralgias, -myalgias, -joint swelling, -back pain Ophthalmology: -vision changes Urology: -dysuria, -difficulty urinating, -hematuria, -urinary frequency, -urgency Neurology: +headache, -weakness, -tingling, -numbness    Objective BP 118/70   Pulse 71   Wt 184 lb 9.6 oz (83.7 kg)   LMP 02/16/2003   SpO2 99%   BMI 29.80 kg/m   General appearance: alert, no distress, WD/WN,  HEENT: normocephalic, sclerae anicteric, PERRLA, EOMi, +maxillary sinus tenderness, nares patent, no discharge or erythema, pharynx normal Oral cavity: MMM, no lesions Neck: supple, no lymphadenopathy, no thyromegaly, no masses, no bruits Chest wall nontender, no deformity Heart: RRR, normal S1, S2, no murmurs Lungs: CTA  bilaterally, no wheezes, rhonchi, or rales Abdomen: +bs, soft, non tender, non distended, no masses, no hepatomegaly, no splenomegaly Back: non tender Musculoskeletal: nontender, no swelling, no obvious deformity Extremities: no edema, no cyanosis, no clubbing Pulses: 2+ symmetric, upper and lower extremities, normal cap refill Neurological: alert, oriented x 3, CN2-12 intact, strength normal upper extremities and lower extremities, sensation normal throughout, DTRs 2+ throughout, no  cerebellar signs, gait normal Psychiatric: normal affect, behavior normal, pleasant     Adult ECG Report  Indication: chest pain  Rate: 63 bpm  Rhythm: normal sinus rhythm  QRS Axis: -7 degrees  PR Interval: 167ms  QRS Duration: 38ms  QTc: 476ms  Conduction Disturbances: incomplete RBBB  Other Abnormalities: none  Patient's cardiac risk factors are: hypertension.  EKG comparison: 05/2016  Narrative Interpretation: no acute changes   Assessment: Encounter Diagnoses  Name Primary?  . Headache syndrome Yes  . Nasal congestion   . Frequent headaches   . Chest pain, unspecified type     Plan: Etiology for chest pains and headache unclear.  Possible related to URI/sinusitis.   Some symptoms suggest allergies/rhinitis.  Begin OTC Flonase. If not seeing improvement in 4-5 days, begin Biaxin.  Rest, hydrate well.  Avoid lots of caffeine.  Advised she keep symptoms diary.    Reviewed EKG and reviewed echocardiogram from last year.  She will be having labs through [facility] dermatology regarding methotrexate in 1.5 weeks.s  Advised she cal with follow up on symptoms within 1 week, recheck otherwise  If headaches don't resolve, consider head imaging.  [place] was seen today for chest pain.  Diagnoses and all orders for this visit:  Headache syndrome  Nasal congestion  Frequent headaches  Chest pain, unspecified type -     EKG 12-Lead  Other orders -     Discontinue: amoxicillin (AMOXIL) 500 MG tablet; 2 tablets po BID x 10 days -     clarithromycin (BIAXIN) 500 MG tablet; Take 1 tablet (500 mg total) by mouth 2 (two) times daily.

## 2017-04-11 DIAGNOSIS — L74519 Primary focal hyperhidrosis, unspecified: Secondary | ICD-10-CM | POA: Diagnosis not present

## 2017-04-11 DIAGNOSIS — B372 Candidiasis of skin and nail: Secondary | ICD-10-CM | POA: Diagnosis not present

## 2017-04-11 DIAGNOSIS — Z79899 Other long term (current) drug therapy: Secondary | ICD-10-CM | POA: Diagnosis not present

## 2017-04-11 DIAGNOSIS — L940 Localized scleroderma [morphea]: Secondary | ICD-10-CM | POA: Diagnosis not present

## 2017-04-13 DIAGNOSIS — H3589 Other specified retinal disorders: Secondary | ICD-10-CM | POA: Diagnosis not present

## 2017-04-13 DIAGNOSIS — H40023 Open angle with borderline findings, high risk, bilateral: Secondary | ICD-10-CM | POA: Diagnosis not present

## 2017-04-13 DIAGNOSIS — H04123 Dry eye syndrome of bilateral lacrimal glands: Secondary | ICD-10-CM | POA: Diagnosis not present

## 2017-04-13 DIAGNOSIS — H40043 Steroid responder, bilateral: Secondary | ICD-10-CM | POA: Diagnosis not present

## 2017-04-13 DIAGNOSIS — H11423 Conjunctival edema, bilateral: Secondary | ICD-10-CM | POA: Diagnosis not present

## 2017-06-13 ENCOUNTER — Other Ambulatory Visit (HOSPITAL_COMMUNITY): Payer: Self-pay | Admitting: *Deleted

## 2017-06-13 DIAGNOSIS — I5022 Chronic systolic (congestive) heart failure: Secondary | ICD-10-CM

## 2017-07-06 ENCOUNTER — Other Ambulatory Visit: Payer: Self-pay | Admitting: Family Medicine

## 2017-07-06 NOTE — Telephone Encounter (Signed)
Attempted to call pt- she is due for CPE in Dec. Will fill for 30 days.

## 2017-08-08 DIAGNOSIS — L74510 Primary focal hyperhidrosis, axilla: Secondary | ICD-10-CM | POA: Diagnosis not present

## 2017-08-08 DIAGNOSIS — L940 Localized scleroderma [morphea]: Secondary | ICD-10-CM | POA: Diagnosis not present

## 2017-08-08 DIAGNOSIS — H209 Unspecified iridocyclitis: Secondary | ICD-10-CM | POA: Diagnosis not present

## 2017-08-08 DIAGNOSIS — B379 Candidiasis, unspecified: Secondary | ICD-10-CM | POA: Diagnosis not present

## 2017-08-08 DIAGNOSIS — Z79899 Other long term (current) drug therapy: Secondary | ICD-10-CM | POA: Diagnosis not present

## 2017-08-12 ENCOUNTER — Ambulatory Visit (HOSPITAL_COMMUNITY)
Admission: RE | Admit: 2017-08-12 | Discharge: 2017-08-12 | Disposition: A | Discharge status: Home / Self Care | Payer: Medicare Other | Source: Ambulatory Visit | Attending: Internal Medicine | Admitting: Internal Medicine

## 2017-08-12 ENCOUNTER — Ambulatory Visit (HOSPITAL_COMMUNITY)
Admission: RE | Admit: 2017-08-12 | Discharge: 2017-08-12 | Disposition: A | Discharge status: Home / Self Care | Payer: Medicare Other | Source: Ambulatory Visit | Attending: Family Medicine | Admitting: Family Medicine

## 2017-08-12 ENCOUNTER — Encounter (HOSPITAL_COMMUNITY): Payer: Self-pay | Admitting: Internal Medicine

## 2017-08-12 ENCOUNTER — Ambulatory Visit (HOSPITAL_BASED_OUTPATIENT_CLINIC_OR_DEPARTMENT_OTHER)
Admission: RE | Admit: 2017-08-12 | Discharge: 2017-08-12 | Disposition: A | Discharge status: Home / Self Care | Payer: Medicare Other | Source: Ambulatory Visit | Attending: Internal Medicine | Admitting: Internal Medicine

## 2017-08-12 VITALS — BP 148/78 | HR 60 | Wt 182.8 lb

## 2017-08-12 DIAGNOSIS — E785 Hyperlipidemia, unspecified: Secondary | ICD-10-CM | POA: Diagnosis not present

## 2017-08-12 DIAGNOSIS — I350 Nonrheumatic aortic (valve) stenosis: Secondary | ICD-10-CM

## 2017-08-12 DIAGNOSIS — I10 Essential (primary) hypertension: Secondary | ICD-10-CM

## 2017-08-12 DIAGNOSIS — I110 Hypertensive heart disease with heart failure: Secondary | ICD-10-CM | POA: Insufficient documentation

## 2017-08-12 DIAGNOSIS — I2584 Coronary atherosclerosis due to calcified coronary lesion: Secondary | ICD-10-CM | POA: Insufficient documentation

## 2017-08-12 DIAGNOSIS — Z79899 Other long term (current) drug therapy: Secondary | ICD-10-CM | POA: Insufficient documentation

## 2017-08-12 DIAGNOSIS — I5022 Chronic systolic (congestive) heart failure: Secondary | ICD-10-CM | POA: Insufficient documentation

## 2017-08-12 DIAGNOSIS — I2510 Atherosclerotic heart disease of native coronary artery without angina pectoris: Secondary | ICD-10-CM | POA: Insufficient documentation

## 2017-08-12 DIAGNOSIS — Z7952 Long term (current) use of systemic steroids: Secondary | ICD-10-CM | POA: Diagnosis not present

## 2017-08-12 DIAGNOSIS — E7800 Pure hypercholesterolemia, unspecified: Secondary | ICD-10-CM | POA: Diagnosis not present

## 2017-08-12 DIAGNOSIS — M349 Systemic sclerosis, unspecified: Secondary | ICD-10-CM

## 2017-08-12 DIAGNOSIS — Z8711 Personal history of peptic ulcer disease: Secondary | ICD-10-CM | POA: Diagnosis not present

## 2017-08-12 DIAGNOSIS — I700 Atherosclerosis of aorta: Secondary | ICD-10-CM | POA: Insufficient documentation

## 2017-08-12 LAB — PULMONARY FUNCTION TEST
DL/VA % PRED: 76 %
DL/VA: 3.78 ml/min/mmHg/L
DLCO UNC: 15.53 ml/min/mmHg
DLCO unc % pred: 60 %
FEF 25-75 Post: 2.25 L/sec
FEF 25-75 Pre: 2.33 L/sec
FEF2575-%Change-Post: -3 %
FEF2575-%Pred-Post: 135 %
FEF2575-%Pred-Pre: 140 %
FEV1-%CHANGE-POST: -1 %
FEV1-%PRED-POST: 108 %
FEV1-%Pred-Pre: 109 %
FEV1-PRE: 2.03 L
FEV1-Post: 2 L
FEV1FVC-%Change-Post: 2 %
FEV1FVC-%Pred-Pre: 108 %
FEV6-%Change-Post: -4 %
FEV6-%Pred-Post: 101 %
FEV6-%Pred-Pre: 105 %
FEV6-POST: 2.33 L
FEV6-PRE: 2.43 L
FEV6FVC-%Change-Post: 0 %
FEV6FVC-%PRED-POST: 103 %
FEV6FVC-%PRED-PRE: 104 %
FVC-%CHANGE-POST: -3 %
FVC-%Pred-Post: 98 %
FVC-%Pred-Pre: 101 %
FVC-Post: 2.34 L
FVC-Pre: 2.43 L
PRE FEV6/FVC RATIO: 100 %
Post FEV1/FVC ratio: 85 %
Post FEV6/FVC ratio: 99 %
Pre FEV1/FVC ratio: 84 %
RV % PRED: 128 %
RV: 2.94 L
TLC % PRED: 103 %
TLC: 5.39 L

## 2017-08-12 LAB — BASIC METABOLIC PANEL
Anion gap: 10 (ref 5–15)
BUN: 11 mg/dL (ref 6–20)
CHLORIDE: 102 mmol/L (ref 101–111)
CO2: 26 mmol/L (ref 22–32)
Calcium: 10.4 mg/dL — ABNORMAL HIGH (ref 8.9–10.3)
Creatinine, Ser: 0.98 mg/dL (ref 0.44–1.00)
GFR calc Af Amer: >60 mL/min (ref >60)
GFR calc non Af Amer: 56 mL/min — ABNORMAL LOW (ref >60)
GLUCOSE: 93 mg/dL (ref 65–99)
Potassium: 3.1 mmol/L — ABNORMAL LOW (ref 3.5–5.1)
Sodium: 138 mmol/L (ref 135–145)

## 2017-08-12 MED ORDER — ASPIRIN EC 81 MG PO TBEC: 81.0000 mg | DELAYED_RELEASE_TABLET | Freq: Every day | ORAL | 3 refills | Status: DC

## 2017-08-12 MED ORDER — ALBUTEROL SULFATE (2.5 MG/3ML) 0.083% IN NEBU
2.5000 mg | INHALATION_SOLUTION | Freq: Once | RESPIRATORY_TRACT | Status: AC
  Administered 2017-08-12: 2.5 mg via RESPIRATORY_TRACT

## 2017-08-12 NOTE — Progress Notes (Signed)
Patient ID: [place] M Perz, female   DOB: 10-Jul-1946, 71 y.o.   MRN: [id number]   [facility]  NOTE  Referring Physician: Dr. Kirke Corin Primary Care: Dr. Glo Herring   HPI: Ms. Creps is a 71 y/o woman with h/o lifelong HTN, scleroderma and morphia who was referred by Dr. Patrecia Pour for screening for [facility].  Today she returns for 1 year follow up. Overall feeling ok. Complaining of mild fatigue. No change. Denies heavy snoring Denies SOB/Orthopnea. No CP. Checks BP at home and systolics 740-814.  Denies syncope. Taking all medications.   08/12/2017 ECHO 60-65%. No evidence PAH. Mild AS Normal RV Personally reviewed  2017 Echo  EF 60-65% moderate LVH. Grade I DD. Very mild AS with fusion on non-coronary cusp. RV normal. No PH.   Hi-res CT of chest done 8/17 for abnormal DLCO 1. No evidence of interstitial lung disease. 2. No evidence of achalasia. 3. No acute findings in the thorax. 4. Aortic atherosclerosis, in addition to left main and 3 vessel coronary artery disease. Please note that although the presence of coronary artery calcium documents the presence of coronary artery disease, the severity of this disease and any potential stenosis cannot be assessed on this non-gated CT examination. Assessment for potential risk factor modification, dietary therapy or pharmacologic therapy may be warranted, if clinically indicated. 5. There are calcifications of the aortic valve. Echocardiographic correlation for evaluation of potential valvular dysfunction may be warranted if clinically indicated.  2017  FEV1 2.07 (109%) FVC  2.54 (104%) DLCO 64%  PFTs (reviewed 08/12/17) - Personally reviewed FEV1 2.03 (109%) FVC  2.43 (101%) DLCO 60%    Past Medical History:  Diagnosis Date  . Allergy   . Arthritis   . Hypercholesterolemia   . Hypertension   . PUD (peptic ulcer disease)   . Scleroderma ([place])     Current Outpatient Prescriptions  Medication Sig Dispense Refill    . Brinzolamide-Brimonidine (SIMBRINZA) 1-0.2 % SUSP Apply 1 drop to eye 3 (three) times daily.     . calcium carbonate (OS-CAL) 600 MG TABS Take 600 mg by mouth daily.      . clarithromycin (BIAXIN) 500 MG tablet Take 1 tablet (500 mg total) by mouth 2 (two) times daily. 20 tablet 0  . folic acid (FOLVITE) 1 MG tablet Take 1 mg by mouth daily.    Marland Kitchen lisinopril-hydrochlorothiazide (PRINZIDE,ZESTORETIC) 10-12.5 MG tablet TAKE 1 TABLET BY MOUTH EVERY DAY 90 tablet 3  . loratadine (CLARITIN) 10 MG tablet Take 10 mg by mouth daily.    . methotrexate (RHEUMATREX) 2.5 MG tablet Take 2.5 mg by mouth once a week. TAKE( 6 )15MG  ONCE A WEEK Caution:Chemotherapy. Protect from light.    . metoprolol succinate (TOPROL-XL) 50 MG 24 hr tablet TAKE 1/2 TABLET BY MOUTH DAILY 30 tablet 0  . multivitamin (THERAGRAN) per tablet Take 1 tablet by mouth daily.      . potassium chloride (KLOR-CON 10) 10 MEQ tablet Take 2 tablets (20 mEq total) by mouth daily. 30 tablet 3  . prednisoLONE acetate (PRED FORTE) 1 % ophthalmic suspension Place 1 drop into both eyes daily.     . rosuvastatin (CRESTOR) 40 MG tablet Take 1 tablet (40 mg total) by mouth daily. 90 tablet 0  . timolol (BETIMOL) 0.5 % ophthalmic solution Place 1 drop into both eyes daily.     No current facility-administered medications for this encounter.     No Known Allergies    Social History  Social History  . Marital status: Divorced    Spouse name: N/A  . Number of children: N/A  . Years of education: N/A   Occupational History  . Not on file.   Social History Main Topics  . Smoking status: Never Smoker  . Smokeless tobacco: Never Used  . Alcohol use No  . Drug use: No  . Sexual activity: Not Currently   Other Topics Concern  . Not on file   Social History Narrative  . No narrative on file      Family History  Problem Relation Age of Onset  . Ulcers Brother   . Heart disease Brother 16       MI  . Cancer Father   . Ulcers  Sister   . Cancer Maternal Grandmother   . Heart disease Maternal Grandfather     Vitals:   08/12/17 1116  BP: (!) 148/78  Pulse: 60  SpO2: 98%  Weight: 182 lb 12.8 oz (82.9 kg)    PHYSICAL EXAM: General:  Well appearing. No resp difficulty HEENT: normal Neck: supple. no JVD. Carotids 2+ bilat; no bruits. No lymphadenopathy or thryomegaly appreciated. Cor: PMI nondisplaced. Regular rate & rhythm. No rubs, gallops. 2/6 SEM LUSB. Lungs: clear Abdomen: soft, nontender, nondistended. No hepatosplenomegaly. No bruits or masses. Good bowel sounds. Extremities: no cyanosis, clubbing, rash, edema. Diffuse arthritic changes Neuro: alert & orientedx3, cranial nerves grossly intact. moves all 4 extremities w/o difficulty. Affect pleasant  ASSESSMENT & PLAN: 1. Scleroderma  --ECHO 60-65%. RV normal Mild LVH.Grade I DD. No evidence PAH Will need yearly ECHO and PFTs.   2. HTN Elevated but SBP lower at home. Continue current regimen.  3. Very mild aortic stenosis ECHO with mild AS today.   Dr Haroldine Laws discussed and reviewed at [place]. Check BMET today and copy results to Dr Redmond School.   Follow up in 1 year  Amy Clegg, NP-C  11:18 AM   Patient seen and examined with Darrick Grinder, NP. We discussed all aspects of the encounter. I agree with the assessment and plan as stated above.   PFTs and echo reviewed personally in clinic. No evidence of PAH. Mild AS on echo. CT scan shows diffuse coronary calcifications. Continue crestor with goal LDL < 70. Start ECASA 81mg  daily. Will get stress echo to exclude critical CAD. Continue yearly echos and PFTs to surveil for development of PAH in setting of scleroderma.  Glori Bickers, MD  12:06 PM

## 2017-08-12 NOTE — Addendum Note (Signed)
Encounter addended by: Scarlette Calico, RN on: 08/12/2017 12:22 PM<BR>    Actions taken: Diagnosis association updated, Sign clinical note, Order list changed

## 2017-08-12 NOTE — Progress Notes (Signed)
  Echocardiogram 2D Echocardiogram has been performed.  Rhonda Buchanan 08/12/2017, 11:00 AM

## 2017-08-12 NOTE — Patient Instructions (Signed)
Start Aspirin 81 mg daily  Your physician has requested that you have a stress echocardiogram. For further information please visit [email or web address]. Please follow instruction sheet as given.  We will contact you in 1 year to schedule your next appointment.

## 2017-08-16 DIAGNOSIS — H26493 Other secondary cataract, bilateral: Secondary | ICD-10-CM | POA: Diagnosis not present

## 2017-08-16 DIAGNOSIS — H40023 Open angle with borderline findings, high risk, bilateral: Secondary | ICD-10-CM | POA: Diagnosis not present

## 2017-08-16 DIAGNOSIS — H04123 Dry eye syndrome of bilateral lacrimal glands: Secondary | ICD-10-CM | POA: Diagnosis not present

## 2017-08-16 DIAGNOSIS — H5211 Myopia, right eye: Secondary | ICD-10-CM | POA: Diagnosis not present

## 2017-08-16 DIAGNOSIS — H40053 Ocular hypertension, bilateral: Secondary | ICD-10-CM | POA: Diagnosis not present

## 2017-08-23 ENCOUNTER — Telehealth (HOSPITAL_COMMUNITY): Payer: Self-pay

## 2017-08-23 NOTE — Telephone Encounter (Signed)
Result Notes for Basic metabolic panel   Notes recorded by Effie Berkshire, RN on 08/23/2017 at 9:55 AM EST Patient has potassium on her med list to take 20 meq once daily, however she was under the impression to take it until refills were gone and to stop, so she has not taken supplemental K for a few months now. ------  Notes recorded by Jolaine Artist, MD on 08/22/2017 at 8:15 PM EST Did anyone supp her K yet?

## 2017-08-25 ENCOUNTER — Other Ambulatory Visit (HOSPITAL_COMMUNITY): Payer: Self-pay | Admitting: *Deleted

## 2017-08-25 MED ORDER — POTASSIUM CHLORIDE ER 10 MEQ PO TBCR: 20.0000 meq | EXTENDED_RELEASE_TABLET | Freq: Every day | ORAL | 3 refills | Status: DC

## 2017-08-29 ENCOUNTER — Telehealth (HOSPITAL_COMMUNITY): Payer: Self-pay

## 2017-08-29 NOTE — Telephone Encounter (Signed)
Notes recorded by Effie Berkshire, RN on 08/29/2017 at 4:28 PM EST Patient aware and agreeable. Will call CHF clinic back to schedule her lab apt as she is away from her calender and out of town at the moment. Rx sent to preferred pharmacy electronically, med list updated to patient's chart. ------  Notes recorded by Bensimhon, Shaune Pascal, MD on 08/25/2017 at 9:17 PM EST Lets restart K. 20 daily take 40 the first day Recheck 1 week thanks ------  Notes recorded by Effie Berkshire, RN on 08/23/2017 at 9:55 AM EST Patient has potassium on her med list to take 20 meq once daily, however she was under the impression to take it until refills were gone and to stop, so she has not taken supplemental K for a few months now. ------  Notes recorded by Jolaine Artist, MD on 08/22/2017 at 8:15 PM EST Did anyone supp her K yet?

## 2017-09-02 ENCOUNTER — Telehealth: Payer: Self-pay | Admitting: Family Medicine

## 2017-09-02 NOTE — Telephone Encounter (Signed)
Pt called for refills of metoprolol. She has an appt in January. She is almost out. Please send to [facility] [address]. Pt can be reached at [phone].

## 2017-09-04 MED ORDER — METOPROLOL SUCCINATE ER 50 MG PO TB24: 25.0000 mg | ORAL_TABLET | Freq: Every day | ORAL | 0 refills | Status: DC

## 2017-09-09 ENCOUNTER — Other Ambulatory Visit: Payer: Self-pay | Admitting: Family Medicine

## 2017-09-24 ENCOUNTER — Other Ambulatory Visit: Payer: Self-pay | Admitting: Family Medicine

## 2017-09-26 ENCOUNTER — Other Ambulatory Visit (HOSPITAL_COMMUNITY): Payer: Medicare Other

## 2017-09-29 ENCOUNTER — Telehealth (HOSPITAL_COMMUNITY): Payer: Self-pay | Admitting: *Deleted

## 2017-09-29 NOTE — Telephone Encounter (Signed)
Patient given detailed instructions per Stress Test Requisition Sheet for test on 12/171/8 at 2:30.Patient Notified to arrive 30 minutes early, and that it is imperative to arrive on time for appointment to keep from having the test rescheduled.  Patient verbalized understanding. Rhonda Buchanan

## 2017-10-03 ENCOUNTER — Ambulatory Visit (HOSPITAL_BASED_OUTPATIENT_CLINIC_OR_DEPARTMENT_OTHER): Payer: Medicare Other

## 2017-10-03 ENCOUNTER — Other Ambulatory Visit: Payer: Self-pay

## 2017-10-03 ENCOUNTER — Ambulatory Visit (HOSPITAL_COMMUNITY): Payer: Medicare Other | Attending: Cardiovascular Disease

## 2017-10-03 DIAGNOSIS — I119 Hypertensive heart disease without heart failure: Secondary | ICD-10-CM | POA: Diagnosis not present

## 2017-10-03 DIAGNOSIS — Z8249 Family history of ischemic heart disease and other diseases of the circulatory system: Secondary | ICD-10-CM | POA: Insufficient documentation

## 2017-10-03 DIAGNOSIS — E785 Hyperlipidemia, unspecified: Secondary | ICD-10-CM | POA: Insufficient documentation

## 2017-10-03 DIAGNOSIS — I2584 Coronary atherosclerosis due to calcified coronary lesion: Secondary | ICD-10-CM | POA: Insufficient documentation

## 2017-10-03 DIAGNOSIS — I2510 Atherosclerotic heart disease of native coronary artery without angina pectoris: Secondary | ICD-10-CM

## 2017-10-15 NOTE — Progress Notes (Signed)
Office Visit Note  Patient: Rhonda Buchanan             Date of Birth: 10/20/1945           MRN: [id number]             PCP: Denita Lung, MD Referring: Denita Lung, MD Visit Date: 10/26/2017 Occupation: @GUAROCC @    Subjective:  Knee pain    History of Present Illness: Rhonda Buchanan is a 71 y.o. female history of morphea scleroderma, Raynaud's, recurrent iritis, and osteoarthritis.  Patient states her left thumb trigger finger has resolved.  She reports occasional knee pain that is worse with climbing stairs.  She denies knee swelling.  She continues to take MTX 6 tablets every week and folic acid 2 mg daily, which are prescribed by her dermatologist.  She continues to have symptoms of Raynaud's and she tries to wear gloves regularly.  She denies any skin ulcerations, skin tightness, or rashes.  She states she continues to have flares of iritis, so she follows closely with her ophthalmologist.     Activities of Daily Living:  Patient reports morning stiffness for 5-10 minutes.   Patient Denies nocturnal pain.  Difficulty dressing/grooming: Denies Difficulty climbing stairs: Reports Difficulty getting out of chair: Reports Difficulty using hands for taps, buttons, cutlery, and/or writing: Reports   Review of Systems  Constitutional: Negative for fatigue and weakness.  HENT: Negative for mouth sores, mouth dryness and nose dryness.   Eyes: Positive for dryness. Negative for redness.  Respiratory: Negative for cough, hemoptysis, shortness of breath and difficulty breathing.   Cardiovascular: Negative for chest pain, palpitations, hypertension and swelling in legs/feet.  Gastrointestinal: Negative for blood in stool, constipation and diarrhea.  Endocrine: Negative for increased urination.  Genitourinary: Negative for painful urination.  Musculoskeletal: Positive for arthralgias, joint pain and morning stiffness. Negative for joint swelling, myalgias, muscle weakness,  muscle tenderness and myalgias.  Skin: Positive for color change. Negative for pallor, rash, hair loss, nodules/bumps, redness, skin tightness, ulcers and sensitivity to sunlight.  Neurological: Positive for headaches. Negative for dizziness and numbness.  Hematological: Negative for swollen glands.  Psychiatric/Behavioral: Negative for depressed mood and sleep disturbance. The patient is not nervous/anxious.     PMFS History:  Patient Active Problem List   Diagnosis Date Noted  . Atherosclerosis of coronary artery of native heart without angina pectoris 10/05/2016  . Aortic stenosis 05/21/2016  . Abnormal PFTs 05/21/2016  . Asthma due to environmental allergies 06/24/2015  . 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 Scleroderma ([place]) 07/12/2013  . Hypertension 03/04/2011  . Hyperlipidemia 03/04/2011  . Allergic rhinitis 03/04/2011  . Arthritis 03/04/2011  . Migraine variant 04/18/1995    Past Medical History:  Diagnosis Date  . Allergy   . Arthritis   . Hypercholesterolemia   . Hypertension   . PUD (peptic ulcer disease)   . Scleroderma ([place])     Family History  Problem Relation Age of Onset  . Ulcers Brother   . Heart disease Brother 35       MI  . Cancer Father   . Ulcers Sister   . Cancer Maternal Grandmother   . Heart disease Maternal Grandfather    Past Surgical History:  Procedure Laterality Date  . arthroscopy    . CYST REMOVAL HAND    . EYE SURGERY Left 10/25/2017   laser surgery    Social History  Social History Narrative  . Not on file     Objective: Vital Signs: BP 132/70 (BP Location: Left Arm, Patient Position: Sitting, Cuff Size: Normal)   Pulse 69   Resp 15   Ht 5\' 6"  (1.676 m)   Wt 183 lb (83 kg)   LMP 02/16/2003   BMI 29.54 kg/m    Physical Exam  Constitutional: She is oriented to person, place, and time. She appears well-developed and well-nourished.  HENT:  Head: Normocephalic  and atraumatic.  Eyes: Conjunctivae and EOM are normal.  Neck: Normal range of motion.  Cardiovascular: Normal rate, regular rhythm, normal heart sounds and intact distal pulses.  Pulmonary/Chest: Effort normal and breath sounds normal.  Abdominal: Soft. Bowel sounds are normal.  Lymphadenopathy:    She has no cervical adenopathy.  Neurological: She is alert and oriented to person, place, and time.  Skin: Skin is warm and dry. Capillary refill takes less than 2 seconds.  Psychiatric: She has a normal mood and affect. Her behavior is normal.  Nursing note and vitals reviewed.    Musculoskeletal Exam: Limited C-spine ROM.  Thoracic and lumbar good ROM.  Shoulder joints, elbow joints, wrist joints, MCPs, DIPs, and PIPs good ROM with no synovitis.  Hip joints, knee joints, ankle joints, MTPs, PIPs, and DIPs good ROM with no synovitis. No signs of sclerodactyly or nail bed changes.  No skin tightness evident.    CDAI Exam: No CDAI exam completed.    Investigation: No additional findings. CBC Latest Ref Rng & Units 10/19/2017 10/05/2016 04/23/2015  WBC 3.8 - 10.8 Thousand/uL 3.7(L) 3.7(L) 3.5(L)  Hemoglobin 11.7 - 15.5 g/dL 13.4 14.1 14.0  Hematocrit 35.0 - 45.0 % 39.0 41.0 41.1  Platelets 140 - 400 Thousand/uL 224 226 251   CMP Latest Ref Rng & Units 10/19/2017 08/12/2017 10/05/2016  Glucose 65 - 99 mg/dL 110(H) 93 108(H)  BUN 7 - 25 mg/dL 12 11 11   Creatinine 0.60 - 0.93 mg/dL 0.99(H) 0.98 0.99(H)  Sodium 135 - 146 mmol/L 139 138 140  Potassium 3.5 - 5.3 mmol/L 3.6 3.1(L) 3.6  Chloride 98 - 110 mmol/L 103 102 105  CO2 20 - 32 mmol/L 28 26 25   Calcium 8.6 - 10.4 mg/dL 9.5 10.4(H) 9.2  Total Protein 6.1 - 8.1 g/dL 6.8 - 7.0  Total Bilirubin 0.2 - 1.2 mg/dL 0.8 - 0.8  Alkaline Phos 33 - 130 U/L - - 66  AST 10 - 35 U/L 21 - 25  ALT 6 - 29 U/L 16 - 23    Imaging: No results found.  Speciality Comments: No specialty comments available.    Procedures:  No procedures  performed Allergies: Patient has no known allergies.     Assessment / Plan:     Visit Diagnoses: Morphea scleroderma: She sees dermatologist in [place] on a regular basis.  Her dermatologist prescribes her MTX.  She is taking MTX 6 tablets every week and Folic acid 2 mg daily.  Her most recent CBC and CMP were on 10/19/17, which were stable.   Scleroderma ([facility]) - -RF, -CCP, -ANA, - PanANCA, no nail bed changes, no sclerodactyly.  She saw Dr. Haroldine Laws on 08/12/17 who recommended a yearly echo and PFTs.  Her most recent echo on 08/12/17 revealed  Echo 60-65%.  RV normal.  Mild LVH.  Grade I DD. No evidence of PAH. Blood pressure was 132/70  in the office today.    High risk medication use - MTX 6 tablets every week, folic acid  2 mg.  Labs on 10/19/17 were stable.    Raynaud's syndrome without gangrene: No digital ulcers.  She continues to have symptoms of Raynaud's.  She wears gloves regularly.  Discussed the importance of keeping her core body temperature warm.    Recurrent acute iridocyclitis of both eyes: She continues to have flares of iritis.  She is following along closely with her ophthalmologist. She is using Simbrinza and prednisolone drops.     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  No knee effusion or warmth.  She has crepitus bilaterally.    Other medical conditions are listed as follows:   Positive PPD, treated  Bilateral calcaneal spurs  History of hypertension  History of migraine  History of hyperlipidemia  History of arthroscopic knee surgery    Orders: No orders of the defined types were placed in this encounter.  No orders of the defined types were placed in this encounter.     Follow-Up Instructions: Return in about 6 months (around 04/25/2018) for Scleroderma, Osteoarthritis.   Bo Merino, MD  Note - This record has been created using Editor, commissioning.  Chart creation errors have been  sought, but may not always  have been located. Such creation errors do not reflect on  the standard of medical care.

## 2017-10-19 ENCOUNTER — Ambulatory Visit: Payer: Medicare Other | Admitting: Family Medicine

## 2017-10-19 ENCOUNTER — Encounter: Payer: Self-pay | Admitting: Family Medicine

## 2017-10-19 VITALS — BP 124/70 | HR 67 | Ht 66.0 in | Wt 180.4 lb

## 2017-10-19 DIAGNOSIS — Z1211 Encounter for screening for malignant neoplasm of colon: Secondary | ICD-10-CM | POA: Diagnosis not present

## 2017-10-19 DIAGNOSIS — M349 Systemic sclerosis, unspecified: Secondary | ICD-10-CM

## 2017-10-19 DIAGNOSIS — Z0000 Encounter for general adult medical examination without abnormal findings: Secondary | ICD-10-CM

## 2017-10-19 DIAGNOSIS — Z1159 Encounter for screening for other viral diseases: Secondary | ICD-10-CM

## 2017-10-19 DIAGNOSIS — I350 Nonrheumatic aortic (valve) stenosis: Secondary | ICD-10-CM

## 2017-10-19 DIAGNOSIS — G43809 Other migraine, not intractable, without status migrainosus: Secondary | ICD-10-CM | POA: Diagnosis not present

## 2017-10-19 DIAGNOSIS — J301 Allergic rhinitis due to pollen: Secondary | ICD-10-CM

## 2017-10-19 DIAGNOSIS — I2510 Atherosclerotic heart disease of native coronary artery without angina pectoris: Secondary | ICD-10-CM | POA: Diagnosis not present

## 2017-10-19 DIAGNOSIS — E785 Hyperlipidemia, unspecified: Secondary | ICD-10-CM | POA: Diagnosis not present

## 2017-10-19 DIAGNOSIS — M1990 Unspecified osteoarthritis, unspecified site: Secondary | ICD-10-CM

## 2017-10-19 DIAGNOSIS — I10 Essential (primary) hypertension: Secondary | ICD-10-CM

## 2017-10-19 DIAGNOSIS — J45909 Unspecified asthma, uncomplicated: Secondary | ICD-10-CM

## 2017-10-19 NOTE — Progress Notes (Signed)
[place] Rhonda Buchanan is a 72 y.o. female who presents for annual wellness visit CPE and follow-up on chronic medical conditions.  She has the following concerns: She does have an underlying history of scleroderma and continues on methotrexate for that.  She also has hyperlipidemia and is taking  Crestor for this.  She continues on her blood pressure medications and is having no difficulty with that.  Her asthma is giving him really no difficulty.  She does have a history of aortic stenosis.  She also has a history of coronary disease but again no chest pain, shortness of breath.  Her migraines give her very little difficulty.  Arthritis is starting to bother her however she is presently not on any medications. One of her sisters is now living with her which seems to be okay but does interfere with her normal activities. Immunizations and Health Maintenance Immunization History  Administered Date(s) Administered  . H1N1 08/22/2009  . Influenza, High Dose Seasonal PF 06/24/2015, 10/05/2016  . Influenza,inj,Quad PF,6+ Mos 07/12/2013, 01/15/2015  . Pneumococcal Conjugate-13 05/20/2014  . Pneumococcal Polysaccharide-23 01/31/2007  . Tdap 01/31/2007  . Zoster 07/02/2008   Health Maintenance Due  Topic Date Due  . Hepatitis C Screening  [birth year]  . PNA vac Low Risk Adult (2 of 2 - PPSV23) 05/21/2015  . TETANUS/TDAP  01/30/2017  . INFLUENZA VACCINE  05/18/2017    Last Pap smear: n/a, not in years. Last mammogram: 08/2016 Last colonoscopy: 2009 Last DEXA: 04/04/13 Dentist: no Ophtho: yes  Exercise: no  Other doctors caring for patient include: Dr.Sayed, Dr.Bensimhon,   Advanced directives:  No  Information given.  Depression screen:  See questionnaire below.  Depression screen [facility] 2/9 10/05/2016 06/24/2015 03/20/2013  Decreased Interest 0 0 0  Down, Depressed, Hopeless 0 0 0  PHQ - 2 Score 0 0 0    Fall Risk Screen: see questionnaire below. Fall Risk  10/05/2016 06/24/2015 04/30/2015 09/24/2013   Falls in the past year? Yes No No No  Number falls in past yr: 1 - - -  Injury with Fall? No - - -  Risk for fall due to : Other (Comment) - - -  Risk for fall due to: Comment she had vertigo - - -    ADL screen:  See questionnaire below Functional Status Survey:     Review of Systems Constitutional: -, -unexpected weight change, -anorexia, -fatigue Negative except as above  PHYSICAL EXAM:  LMP 02/16/2003   General Appearance: Alert, cooperative, no distress, appears stated age Head: Normocephalic, without obvious abnormality, atraumatic Eyes: PERRL, conjunctiva/corneas clear, EOM's intact, fundi benign Ears: Normal TM's and external ear canals Nose: Nares normal, mucosa normal, no drainage or sinus tenderness Throat: Lips, mucosa, and tongue normal; teeth and gums normal Neck: Supple, no lymphadenopathy;  thyroid:  no enlargement/tenderness/nodules; no carotid bruit or JVD Lungs: Clear to auscultation bilaterally without wheezes, rales or ronchi; respirations unlabored Heart: Regular rate and rhythm, S1 and S2 normal, No murmur, rubor gallop Abdomen: Soft, non-tender, nondistended, normoactive bowel sounds,  no masses, no hepatosplenomegaly Extremities: No clubbing, cyanosis or edema Pulses: 2+ and symmetric all extremities Skin:  Skin color, texture, turgor normal, no rashes or lesions Lymph nodes: Cervical, supraclavicular, and axillary nodes normal Neurologic:  CNII-XII intact, normal strength, sensation and gait; reflexes 2+ and symmetric throughout Psych: Normal mood, affect, hygiene and grooming.  ASSESSMENT/PLAN: Screening for colon cancer - Plan: Cologuard  Hyperlipidemia, unspecified hyperlipidemia type - Plan: Lipid panel  Scleroderma ([place])  Asthma due  to environmental allergies  Nonrheumatic aortic valve stenosis  Atherosclerosis of coronary artery of native heart without angina pectoris, unspecified vessel or lesion type - Plan: CBC with  Differential/Platelet, Comprehensive metabolic panel, Lipid panel  Allergic rhinitis due to pollen, unspecified seasonality  Migraine variant  Essential hypertension - Plan: CBC with Differential/Platelet, Comprehensive metabolic panel  Arthritis  Encounter for hepatitis C screening test for low risk patient - Plan: Hepatitis C antibody Recommend conservative care for her arthritis.  Continue on her other present medications.  I will send her for a Cologard.  She will continue to be followed concerning her scleroderma.  Today I did not hear any murmur.  Overall she seems to be doing quite nicely.  She is enjoying her retirement.    Medicare Attestation I have personally reviewed: The patient's medical and social history Their use of alcohol, tobacco or illicit drugs Their current medications and supplements The patient's functional ability including ADLs,fall risks, home safety risks, cognitive, and hearing and visual impairment Diet and physical activities Evidence for depression or mood disorders  The patient's weight, height, and BMI have been recorded in the chart.  I have made referrals, counseling, and provided education to the patient based on review of the above and I have provided the patient with a written personalized care plan for preventive services.     Jill Alexanders, MD   10/19/2017

## 2017-10-20 LAB — COMPREHENSIVE METABOLIC PANEL
AG RATIO: 1.5 (calc) (ref 1.0–2.5)
ALT: 16 U/L (ref 6–29)
AST: 21 U/L (ref 10–35)
Albumin: 4.1 g/dL (ref 3.6–5.1)
Alkaline phosphatase (APISO): 62 U/L (ref 33–130)
BILIRUBIN TOTAL: 0.8 mg/dL (ref 0.2–1.2)
BUN / CREAT RATIO: 12 (calc) (ref 6–22)
BUN: 12 mg/dL (ref 7–25)
CALCIUM: 9.5 mg/dL (ref 8.6–10.4)
CO2: 28 mmol/L (ref 20–32)
Chloride: 103 mmol/L (ref 98–110)
Creat: 0.99 mg/dL — ABNORMAL HIGH (ref 0.60–0.93)
GLOBULIN: 2.7 g/dL (ref 1.9–3.7)
GLUCOSE: 110 mg/dL — AB (ref 65–99)
Potassium: 3.6 mmol/L (ref 3.5–5.3)
SODIUM: 139 mmol/L (ref 135–146)
TOTAL PROTEIN: 6.8 g/dL (ref 6.1–8.1)

## 2017-10-20 LAB — CBC WITH DIFFERENTIAL/PLATELET
BASOS PCT: 1.1 %
Basophils Absolute: 41 cells/uL (ref 0–200)
EOS ABS: 111 {cells}/uL (ref 15–500)
Eosinophils Relative: 3 %
HCT: 39 % (ref 35.0–45.0)
HEMOGLOBIN: 13.4 g/dL (ref 11.7–15.5)
Lymphs Abs: 1084 cells/uL (ref 850–3900)
MCH: 31.1 pg (ref 27.0–33.0)
MCHC: 34.4 g/dL (ref 32.0–36.0)
MCV: 90.5 fL (ref 80.0–100.0)
MONOS PCT: 7.3 %
MPV: 11.2 fL (ref 7.5–12.5)
NEUTROS ABS: 2194 {cells}/uL (ref 1500–7800)
Neutrophils Relative %: 59.3 %
PLATELETS: 224 10*3/uL (ref 140–400)
RBC: 4.31 10*6/uL (ref 3.80–5.10)
RDW: 14 % (ref 11.0–15.0)
TOTAL LYMPHOCYTE: 29.3 %
WBC: 3.7 10*3/uL — AB (ref 3.8–10.8)
WBCMIX: 270 {cells}/uL (ref 200–950)

## 2017-10-20 LAB — LIPID PANEL
Cholesterol: 210 mg/dL — ABNORMAL HIGH (ref <200)
HDL: 71 mg/dL (ref >50)
LDL Cholesterol (Calc): 117 mg/dL (calc) — ABNORMAL HIGH
NON-HDL CHOLESTEROL (CALC): 139 mg/dL — AB (ref <130)
TRIGLYCERIDES: 112 mg/dL (ref <150)
Total CHOL/HDL Ratio: 3 (calc) (ref <5.0)

## 2017-10-20 LAB — HEPATITIS C ANTIBODY
HEP C AB: NONREACTIVE
SIGNAL TO CUT-OFF: 0.01 (ref <1.00)

## 2017-10-25 DIAGNOSIS — Z961 Presence of intraocular lens: Secondary | ICD-10-CM | POA: Diagnosis not present

## 2017-10-25 DIAGNOSIS — H40013 Open angle with borderline findings, low risk, bilateral: Secondary | ICD-10-CM | POA: Diagnosis not present

## 2017-10-25 DIAGNOSIS — H26492 Other secondary cataract, left eye: Secondary | ICD-10-CM | POA: Diagnosis not present

## 2017-10-25 HISTORY — PX: EYE SURGERY: SHX253

## 2017-10-26 ENCOUNTER — Ambulatory Visit: Payer: Medicare Other | Admitting: Rheumatology

## 2017-10-26 ENCOUNTER — Encounter: Payer: Self-pay | Admitting: Rheumatology

## 2017-10-26 VITALS — BP 132/70 | HR 69 | Resp 15 | Ht 66.0 in | Wt 183.0 lb

## 2017-10-26 DIAGNOSIS — I7300 Raynaud's syndrome without gangrene: Secondary | ICD-10-CM | POA: Diagnosis not present

## 2017-10-26 DIAGNOSIS — M170 Bilateral primary osteoarthritis of knee: Secondary | ICD-10-CM | POA: Diagnosis not present

## 2017-10-26 DIAGNOSIS — M349 Systemic sclerosis, unspecified: Secondary | ICD-10-CM | POA: Diagnosis not present

## 2017-10-26 DIAGNOSIS — H20023 Recurrent acute iridocyclitis, bilateral: Secondary | ICD-10-CM | POA: Diagnosis not present

## 2017-10-26 DIAGNOSIS — Z79899 Other long term (current) drug therapy: Secondary | ICD-10-CM

## 2017-10-26 DIAGNOSIS — Z8669 Personal history of other diseases of the nervous system and sense organs: Secondary | ICD-10-CM

## 2017-10-26 DIAGNOSIS — M7732 Calcaneal spur, left foot: Secondary | ICD-10-CM

## 2017-10-26 DIAGNOSIS — Z8679 Personal history of other diseases of the circulatory system: Secondary | ICD-10-CM | POA: Diagnosis not present

## 2017-10-26 DIAGNOSIS — L940 Localized scleroderma [morphea]: Secondary | ICD-10-CM

## 2017-10-26 DIAGNOSIS — R7611 Nonspecific reaction to tuberculin skin test without active tuberculosis: Secondary | ICD-10-CM | POA: Diagnosis not present

## 2017-10-26 DIAGNOSIS — Z9889 Other specified postprocedural states: Secondary | ICD-10-CM | POA: Diagnosis not present

## 2017-10-26 DIAGNOSIS — Z8639 Personal history of other endocrine, nutritional and metabolic disease: Secondary | ICD-10-CM

## 2017-10-26 DIAGNOSIS — M7731 Calcaneal spur, right foot: Secondary | ICD-10-CM

## 2017-10-26 NOTE — Patient Instructions (Signed)
Standing Labs We placed an order today for your standing lab work.    Please come back and get your standing labs in April and every 3 months  We have open lab Monday through Friday from 8:30-11:30 AM and 1:30-4 PM at the office of Dr. Shaili Deveshwar.   The office is located at [address], [address], [place],  [address] No appointment is necessary.   Labs are drawn by [facility].  You may receive a bill from [facility] for your lab work. If you have any questions regarding directions or hours of operation,  please call [phone].    

## 2017-10-27 ENCOUNTER — Other Ambulatory Visit (HOSPITAL_COMMUNITY): Payer: Self-pay | Admitting: Internal Medicine

## 2017-11-03 ENCOUNTER — Other Ambulatory Visit: Payer: Self-pay | Admitting: Family Medicine

## 2017-11-09 DIAGNOSIS — H26491 Other secondary cataract, right eye: Secondary | ICD-10-CM | POA: Diagnosis not present

## 2017-11-14 DIAGNOSIS — R21 Rash and other nonspecific skin eruption: Secondary | ICD-10-CM | POA: Diagnosis not present

## 2017-11-30 ENCOUNTER — Other Ambulatory Visit: Payer: Self-pay | Admitting: Family Medicine

## 2017-11-30 ENCOUNTER — Telehealth: Payer: Self-pay

## 2017-11-30 NOTE — Telephone Encounter (Signed)
Called pt to confrim that she is only taking 25 mg. Pt say she is . [place]

## 2017-12-26 DIAGNOSIS — Z0279 Encounter for issue of other medical certificate: Secondary | ICD-10-CM

## 2018-03-06 DIAGNOSIS — Z79899 Other long term (current) drug therapy: Secondary | ICD-10-CM | POA: Diagnosis not present

## 2018-03-06 DIAGNOSIS — L940 Localized scleroderma [morphea]: Secondary | ICD-10-CM | POA: Diagnosis not present

## 2018-03-06 DIAGNOSIS — H209 Unspecified iridocyclitis: Secondary | ICD-10-CM | POA: Diagnosis not present

## 2018-03-14 DIAGNOSIS — Z79899 Other long term (current) drug therapy: Secondary | ICD-10-CM | POA: Diagnosis not present

## 2018-04-18 DIAGNOSIS — H04123 Dry eye syndrome of bilateral lacrimal glands: Secondary | ICD-10-CM | POA: Diagnosis not present

## 2018-04-18 DIAGNOSIS — H40023 Open angle with borderline findings, high risk, bilateral: Secondary | ICD-10-CM | POA: Diagnosis not present

## 2018-04-18 DIAGNOSIS — H40043 Steroid responder, bilateral: Secondary | ICD-10-CM | POA: Diagnosis not present

## 2018-04-18 DIAGNOSIS — H0100A Unspecified blepharitis right eye, upper and lower eyelids: Secondary | ICD-10-CM | POA: Diagnosis not present

## 2018-04-21 NOTE — Progress Notes (Deleted)
Office Visit Note  Patient: Rhonda Buchanan             Date of Birth: 1946-06-19           MRN: [id number]             PCP: Denita Lung, MD Referring: Denita Lung, MD Visit Date: 05/04/2018 Occupation: @GUAROCC @    Subjective:  No chief complaint on file.   History of Present Illness: Rhonda M Patman is a 72 y.o. female ***   Activities of Daily Living:  Patient reports morning stiffness for *** {minute/hour:19697}.   Patient {ACTIONS;DENIES/REPORTS:21021675::"Denies"} nocturnal pain.  Difficulty dressing/grooming: {ACTIONS;DENIES/REPORTS:21021675::"Denies"} Difficulty climbing stairs: {ACTIONS;DENIES/REPORTS:21021675::"Denies"} Difficulty getting out of chair: {ACTIONS;DENIES/REPORTS:21021675::"Denies"} Difficulty using hands for taps, buttons, cutlery, and/or writing: {ACTIONS;DENIES/REPORTS:21021675::"Denies"}   No Rheumatology ROS completed.   PMFS History:  Patient Active Problem List   Diagnosis Date Noted  . Atherosclerosis of coronary artery of native heart without angina pectoris 10/05/2016  . Aortic stenosis 05/21/2016  . Abnormal PFTs 05/21/2016  . Asthma due to environmental allergies 06/24/2015  . 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 Scleroderma ([place]) 07/12/2013  . Hypertension 03/04/2011  . Hyperlipidemia 03/04/2011  . Allergic rhinitis 03/04/2011  . Arthritis 03/04/2011  . Migraine variant 04/18/1995    Past Medical History:  Diagnosis Date  . Allergy   . Arthritis   . Hypercholesterolemia   . Hypertension   . PUD (peptic ulcer disease)   . Scleroderma ([place])     Family History  Problem Relation Age of Onset  . Ulcers Brother   . Heart disease Brother 82       MI  . Cancer Father   . Ulcers Sister   . Cancer Maternal Grandmother   . Heart disease Maternal Grandfather    Past Surgical History:  Procedure Laterality Date  . arthroscopy    . CYST REMOVAL HAND    . EYE  SURGERY Left 10/25/2017   laser surgery    Social History   Social History Narrative  . Not on file     Objective: Vital Signs: LMP 02/16/2003    Physical Exam   Musculoskeletal Exam: ***  CDAI Exam: No CDAI exam completed.    Investigation: No additional findings. CBC Latest Ref Rng & Units 10/19/2017 10/05/2016 04/23/2015  WBC 3.8 - 10.8 Thousand/uL 3.7(L) 3.7(L) 3.5(L)  Hemoglobin 11.7 - 15.5 g/dL 13.4 14.1 14.0  Hematocrit 35.0 - 45.0 % 39.0 41.0 41.1  Platelets 140 - 400 Thousand/uL 224 226 251   CMP Latest Ref Rng & Units 10/19/2017 08/12/2017 10/05/2016  Glucose 65 - 99 mg/dL 110(H) 93 108(H)  BUN 7 - 25 mg/dL 12 11 11   Creatinine 0.60 - 0.93 mg/dL 0.99(H) 0.98 0.99(H)  Sodium 135 - 146 mmol/L 139 138 140  Potassium 3.5 - 5.3 mmol/L 3.6 3.1(L) 3.6  Chloride 98 - 110 mmol/L 103 102 105  CO2 20 - 32 mmol/L 28 26 25   Calcium 8.6 - 10.4 mg/dL 9.5 10.4(H) 9.2  Total Protein 6.1 - 8.1 g/dL 6.8 - 7.0  Total Bilirubin 0.2 - 1.2 mg/dL 0.8 - 0.8  Alkaline Phos 33 - 130 U/L - - 66  AST 10 - 35 U/L 21 - 25  ALT 6 - 29 U/L 16 - 23    Imaging: No results found.  Speciality Comments: No specialty comments available.    Procedures:  No procedures performed Allergies: Patient has no known allergies.  Assessment / Plan:     Visit Diagnoses: No diagnosis found.    Orders: No orders of the defined types were placed in this encounter.  No orders of the defined types were placed in this encounter.   Face-to-face time spent with patient was *** minutes. Greater than 50% of time was spent in counseling and coordination of care.  Follow-Up Instructions: No follow-ups on file.   Earnestine Mealing, CMA  Note - This record has been created using Editor, commissioning.  Chart creation errors have been sought, but may not always  have been located. Such creation errors do not reflect on  the standard of medical care.

## 2018-04-28 ENCOUNTER — Other Ambulatory Visit: Payer: Self-pay | Admitting: Family Medicine

## 2018-05-04 ENCOUNTER — Ambulatory Visit: Payer: Medicare Other | Admitting: Rheumatology

## 2018-05-10 ENCOUNTER — Encounter: Payer: Self-pay | Admitting: *Deleted

## 2018-05-16 ENCOUNTER — Encounter: Payer: Self-pay | Admitting: Family Medicine

## 2018-05-16 ENCOUNTER — Ambulatory Visit: Payer: Medicare Other | Admitting: Family Medicine

## 2018-05-16 VITALS — BP 124/72 | HR 74 | Temp 98.0°F | Wt 177.6 lb

## 2018-05-16 DIAGNOSIS — R1011 Right upper quadrant pain: Secondary | ICD-10-CM | POA: Diagnosis not present

## 2018-05-16 NOTE — Progress Notes (Signed)
   Subjective:    Patient ID: Rhonda Buchanan, female    DOB: [birth year], 72 y.o.   MRN: [id number]  HPI She complains of a several week history of mid epigastric pain, gas, bloating but no diarrhea, vomiting.  She cannot relate this to any particular foods.   Review of Systems     Objective:   Physical Exam Alert and in no distress.  Cardiac and lung exam normal.  Abdominal exam shows normal bowel sounds.  Positive Murphy sign and Murphy's points.  No hepatosplenomegaly noted.       Assessment & Plan:  Right upper quadrant pain - Plan: US Abdomen Limited RUQ I explained that I think this is probably gallbladder disease and we will set her up for ultrasound.  She expressed understanding of this.

## 2018-05-19 ENCOUNTER — Ambulatory Visit
Admission: RE | Admit: 2018-05-19 | Discharge: 2018-05-19 | Disposition: A | Discharge status: Home / Self Care | Payer: Medicare Other | Source: Ambulatory Visit | Attending: Family Medicine | Admitting: Family Medicine

## 2018-05-19 DIAGNOSIS — R1011 Right upper quadrant pain: Secondary | ICD-10-CM | POA: Diagnosis not present

## 2018-05-24 ENCOUNTER — Other Ambulatory Visit: Payer: Self-pay

## 2018-05-24 DIAGNOSIS — R1011 Right upper quadrant pain: Secondary | ICD-10-CM

## 2018-06-05 DIAGNOSIS — L940 Localized scleroderma [morphea]: Secondary | ICD-10-CM | POA: Diagnosis not present

## 2018-06-05 DIAGNOSIS — Z79899 Other long term (current) drug therapy: Secondary | ICD-10-CM | POA: Diagnosis not present

## 2018-06-06 ENCOUNTER — Other Ambulatory Visit: Payer: Self-pay | Admitting: Family Medicine

## 2018-06-07 ENCOUNTER — Encounter (HOSPITAL_COMMUNITY)
Admission: RE | Admit: 2018-06-07 | Discharge: 2018-06-07 | Disposition: A | Discharge status: Home / Self Care | Payer: Medicare Other | Source: Ambulatory Visit | Attending: Family Medicine | Admitting: Family Medicine

## 2018-06-07 DIAGNOSIS — R1011 Right upper quadrant pain: Secondary | ICD-10-CM | POA: Diagnosis not present

## 2018-06-07 MED ORDER — TECHNETIUM TC 99M MEBROFENIN IV KIT
5.2000 | PACK | Freq: Once | INTRAVENOUS | Status: AC | PRN
  Administered 2018-06-07: 5.2 via INTRAVENOUS

## 2018-07-02 ENCOUNTER — Other Ambulatory Visit: Payer: Self-pay | Admitting: Family Medicine

## 2018-07-19 DIAGNOSIS — H40053 Ocular hypertension, bilateral: Secondary | ICD-10-CM | POA: Diagnosis not present

## 2018-07-19 DIAGNOSIS — H40023 Open angle with borderline findings, high risk, bilateral: Secondary | ICD-10-CM | POA: Diagnosis not present

## 2018-07-19 DIAGNOSIS — H04123 Dry eye syndrome of bilateral lacrimal glands: Secondary | ICD-10-CM | POA: Diagnosis not present

## 2018-07-19 DIAGNOSIS — H0102B Squamous blepharitis left eye, upper and lower eyelids: Secondary | ICD-10-CM | POA: Diagnosis not present

## 2018-07-19 DIAGNOSIS — H0102A Squamous blepharitis right eye, upper and lower eyelids: Secondary | ICD-10-CM | POA: Diagnosis not present

## 2018-07-25 ENCOUNTER — Other Ambulatory Visit: Payer: Self-pay | Admitting: Family Medicine

## 2018-08-18 NOTE — Progress Notes (Addendum)
Office Visit Note  Patient: Rhonda Buchanan             Date of Birth: [birth year]           MRN: [id number]             PCP: Denita Lung, MD Referring: Denita Lung, MD Visit Date: 09/01/2018 Occupation: @GUAROCC @  Subjective:  Pain in both knees.   History of Present Illness: Rhonda Buchanan is a 72 y.o. female  history of morphea scleroderma, Raynaud's, recurrent iritis, and osteoarthritis.  According to patient she has been having increased discomfort in her knee joints.  She has not noticed any swelling but she thinks that  her left knee feels bigger than the right knee.  She has not seen any worsening of her morphea.  She has been taking methotrexate and followed up by [facility] by the dermatologist.  Has not had a flare of iritis in the last few months.  The Raynauds is not as bothersome currently.  Activities of Daily Living:  Patient reports morning stiffness for 15 minutes.   Patient Reports nocturnal pain.  Difficulty dressing/grooming: Denies Difficulty climbing stairs: Reports Difficulty getting out of chair: Reports Difficulty using hands for taps, buttons, cutlery, and/or writing: Denies  Review of Systems  Constitutional: Negative for fatigue.  HENT: Negative for mouth sores, trouble swallowing, trouble swallowing and mouth dryness.   Eyes: Negative for pain, redness, itching and dryness.  Respiratory: Negative for shortness of breath, wheezing and difficulty breathing.   Cardiovascular: Negative for chest pain, palpitations and swelling in legs/feet.  Gastrointestinal: Negative for abdominal pain, constipation, diarrhea, nausea and vomiting.  Endocrine: Negative for increased urination.  Genitourinary: Negative for painful urination, nocturia and pelvic pain.  Musculoskeletal: Positive for arthralgias, joint pain and morning stiffness. Negative for joint swelling.  Skin: Negative for rash and hair loss.  Allergic/Immunologic: Negative for susceptible  to infections.  Neurological: Negative for dizziness, light-headedness, headaches, memory loss and weakness.  Hematological: Negative for bruising/bleeding tendency.  Psychiatric/Behavioral: Negative for confusion. The patient is not nervous/anxious.     PMFS History:  Patient Active Problem List   Diagnosis Date Noted  . Atherosclerosis of coronary artery of native heart without angina pectoris 10/05/2016  . Aortic stenosis 05/21/2016  . Abnormal PFTs 05/21/2016  . Asthma due to environmental allergies 06/24/2015  . 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 Hypertension 03/04/2011  . Hyperlipidemia 03/04/2011  . Allergic rhinitis 03/04/2011  . Arthritis 03/04/2011  . Migraine variant 04/18/1995    Past Medical History:  Diagnosis Date  . Allergy   . Arthritis   . Hypercholesterolemia   . Hypertension   . PUD (peptic ulcer disease)   . Scleroderma ([place])     Family History  Problem Relation Age of Onset  . Ulcers Brother   . Heart disease Brother 20       MI  . Cancer Father   . Ulcers Sister   . Cancer Maternal Grandmother   . Heart disease Maternal Grandfather    Past Surgical History:  Procedure Laterality Date  . arthroscopy    . CYST REMOVAL HAND    . EYE SURGERY Left 10/25/2017   laser surgery   . KNEE ARTHROPLASTY     Social History   Social History Narrative  . Not on file    Objective: Vital Signs: BP 129/71 (BP Location: Right Arm,  Patient Position: Sitting, Cuff Size: Normal)   Pulse 76   Resp 16   Ht 5' 6.5" (1.689 m)   Wt 177 lb 3.2 oz (80.4 kg)   LMP 02/16/2003   BMI 28.17 kg/m    Physical Exam  Constitutional: She is oriented to person, place, and time. She appears well-developed and well-nourished.  HENT:  Head: Normocephalic and atraumatic.  Eyes: Conjunctivae and EOM are normal.  Neck: Normal range of motion.  Cardiovascular: Normal rate, regular  rhythm, normal heart sounds and intact distal pulses.  Pulmonary/Chest: Effort normal and breath sounds normal.  Abdominal: Soft. Bowel sounds are normal.  Lymphadenopathy:    She has no cervical adenopathy.  Neurological: She is alert and oriented to person, place, and time.  Skin: Skin is warm and dry. Capillary refill takes less than 2 seconds.  Morphea on abdomen , back and chest  Psychiatric: She has a normal mood and affect. Her behavior is normal.  Nursing note and vitals reviewed.    Musculoskeletal Exam: On thoracic lumbar spine good range of motion.  Shoulder joints elbow joints wrist joint MCPs PIPs DIPs been good range of motion with no synovitis.  Hip joints knee joints ankles MTPs PIPs DIPs been good range of motion with no synovitis.  She had discomfort range of motion of bilateral knee joints without any warmth swelling or effusion.  She has bilateral valgus deformity in her knee joints.  CDAI Exam: CDAI Score: Not documented Patient Global Assessment: Not documented; Provider Global Assessment: Not documented Swollen: Not documented; Tender: Not documented Joint Exam   Not documented   There is currently no information documented on the homunculus. Go to the Rheumatology activity and complete the homunculus joint exam.  Investigation: No additional findings.  Imaging: Xr Knee 3 View Left  Result Date: 09/01/2018 Moderate lateral compartment narrowing with lateral osteophytes was noted.  Medial osteophytes were noted.  Moderate patellofemoral narrowing was noted. Impression: These findings are consistent with moderate osteoarthritis and moderate chondral malacia patella.  Xr Knee 3 View Right  Result Date: 09/01/2018 Moderate lateral compartment narrowing with lateral osteophytes was noted.  Medial osteophytes were noted as well.  No chondrocalcinosis was noted.  Moderate patellofemoral narrowing was noted. Impression: These findings are consistent with moderate  osteoarthritis and moderate chondromalacia patella.   Recent Labs: Lab Results  Component Value Date   WBC 3.7 (L) 10/19/2017   HGB 13.4 10/19/2017   PLT 224 10/19/2017   NA 139 10/19/2017   K 3.6 10/19/2017   CL 103 10/19/2017   CO2 28 10/19/2017   GLUCOSE 110 (H) 10/19/2017   BUN 12 10/19/2017   CREATININE 0.99 (H) 10/19/2017   BILITOT 0.8 10/19/2017   ALKPHOS 66 10/05/2016   AST 21 10/19/2017   ALT 16 10/19/2017   PROT 6.8 10/19/2017   ALBUMIN 4.2 10/05/2016   CALCIUM 9.5 10/19/2017   GFRAA >60 08/12/2017    Speciality Comments: No specialty comments available.  Procedures:  Large Joint Inj: bilateral knee on 09/01/2018 12:09 PM Indications: pain Details: 27 G 1.5 in needle, medial approach  Arthrogram: No  Medications (Right): 1.5 mL lidocaine 1 %; 40 mg triamcinolone acetonide 40 MG/ML Aspirate (Right): 0 mL Medications (Left): 1.5 mL lidocaine 1 %; 40 mg triamcinolone acetonide 40 MG/ML Aspirate (Left): 0 mL Outcome: tolerated well, no immediate complications Procedure, treatment alternatives, risks and benefits explained, specific risks discussed. Consent was given by the patient. Immediately prior to procedure a time out was called to  verify the correct patient, procedure, equipment, support staff and site/side marked as required. Patient was prepped and draped in the usual sterile fashion.     Allergies: Patient has no known allergies.   Assessment / Plan:     Visit Diagnoses: Chronic pain of both knees -she is been having increased pain and discomfort in her bilateral knee joints.  No warmth swelling or effusion was noted.  Plan: XR KNEE 3 VIEW RIGHT, XR KNEE 3 VIEW LEFT.  The x-rays revealed bilateral moderate osteoarthritis with bilateral lateral compartment narrowing and bilateral moderate chondromalacia patella.  She has bilateral valgus deformity in her knee joints.  Per her request bilateral knee joints were injected with cortisone as described above.   Due to her history of scleroderma I would apply for Visco supplement injections and discomfort which cortisone injections in future.  Weight loss diet and exercise and knee joint muscle strengthening exercises were discussed.  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  Her dermatologist prescribes her MTX  Scleroderma ([place]) - -RF, -CCP, -ANA, - PanANCA, no nail bed changes, no sclerodactyly.  High risk medication use - Current regimen includes methotrexate 15 mg weekly and folic acid 2 mg daily which are managed by her dermatologist. Most recent CBC/CMP stable on 06/05/18 from [facility].  Recommend annual high dose flu, Pneumovax, and Shingrix vaccine as indicated.  Recurrent acute iridocyclitis of both eyes -patient states she has not had iritis flare in the last few months.  Raynaud's syndrome without gangrene-currently not active.  Warm clothing and protection was discussed.  Other medical problems are listed as follows:  Positive PPD, treated  Bilateral calcaneal spurs  History of hyperlipidemia  History of hypertension-blood pressure is well controlled.  History of migraine  History of arthroscopic knee surgery   Orders: Orders Placed This Encounter  Procedures  . Large Joint Inj: bilateral knee  . XR KNEE 3 VIEW RIGHT  . XR KNEE 3 VIEW LEFT   No orders of the defined types were placed in this encounter.     Follow-Up Instructions: Return in about 6 months (around 03/02/2019) for Morphea, osteoarthritis.   Bo Merino, MD  Note - This record has been created using Editor, commissioning.  Chart creation errors have been sought, but may not always  have been located. Such creation errors do not reflect on  the standard of medical care.

## 2018-09-01 ENCOUNTER — Encounter (INDEPENDENT_AMBULATORY_CARE_PROVIDER_SITE_OTHER): Payer: Self-pay

## 2018-09-01 ENCOUNTER — Ambulatory Visit: Payer: Medicare Other | Admitting: Rheumatology

## 2018-09-01 ENCOUNTER — Ambulatory Visit (INDEPENDENT_AMBULATORY_CARE_PROVIDER_SITE_OTHER): Payer: Medicare Other

## 2018-09-01 ENCOUNTER — Telehealth: Payer: Self-pay

## 2018-09-01 ENCOUNTER — Encounter: Payer: Self-pay | Admitting: Rheumatology

## 2018-09-01 ENCOUNTER — Ambulatory Visit (INDEPENDENT_AMBULATORY_CARE_PROVIDER_SITE_OTHER): Payer: Self-pay

## 2018-09-01 VITALS — BP 129/71 | HR 76 | Resp 16 | Ht 66.5 in | Wt 177.2 lb

## 2018-09-01 DIAGNOSIS — I7300 Raynaud's syndrome without gangrene: Secondary | ICD-10-CM

## 2018-09-01 DIAGNOSIS — M7731 Calcaneal spur, right foot: Secondary | ICD-10-CM

## 2018-09-01 DIAGNOSIS — M170 Bilateral primary osteoarthritis of knee: Secondary | ICD-10-CM

## 2018-09-01 DIAGNOSIS — L940 Localized scleroderma [morphea]: Secondary | ICD-10-CM | POA: Diagnosis not present

## 2018-09-01 DIAGNOSIS — H20023 Recurrent acute iridocyclitis, bilateral: Secondary | ICD-10-CM | POA: Diagnosis not present

## 2018-09-01 DIAGNOSIS — G8929 Other chronic pain: Secondary | ICD-10-CM

## 2018-09-01 DIAGNOSIS — M349 Systemic sclerosis, unspecified: Secondary | ICD-10-CM

## 2018-09-01 DIAGNOSIS — M25562 Pain in left knee: Secondary | ICD-10-CM | POA: Diagnosis not present

## 2018-09-01 DIAGNOSIS — M25561 Pain in right knee: Secondary | ICD-10-CM | POA: Diagnosis not present

## 2018-09-01 DIAGNOSIS — Z79899 Other long term (current) drug therapy: Secondary | ICD-10-CM

## 2018-09-01 DIAGNOSIS — Z8679 Personal history of other diseases of the circulatory system: Secondary | ICD-10-CM

## 2018-09-01 DIAGNOSIS — M7732 Calcaneal spur, left foot: Secondary | ICD-10-CM

## 2018-09-01 DIAGNOSIS — Z9889 Other specified postprocedural states: Secondary | ICD-10-CM

## 2018-09-01 DIAGNOSIS — R7611 Nonspecific reaction to tuberculin skin test without active tuberculosis: Secondary | ICD-10-CM

## 2018-09-01 DIAGNOSIS — Z8639 Personal history of other endocrine, nutritional and metabolic disease: Secondary | ICD-10-CM

## 2018-09-01 DIAGNOSIS — Z8669 Personal history of other diseases of the nervous system and sense organs: Secondary | ICD-10-CM

## 2018-09-01 MED ORDER — TRIAMCINOLONE ACETONIDE 40 MG/ML IJ SUSP
40.0000 mg | INTRAMUSCULAR | Status: AC | PRN
  Administered 2018-09-01: 40 mg via INTRA_ARTICULAR

## 2018-09-01 MED ORDER — LIDOCAINE HCL 1 % IJ SOLN
1.5000 mL | INTRAMUSCULAR | Status: AC | PRN
  Administered 2018-09-01: 1.5 mL

## 2018-09-01 NOTE — Patient Instructions (Signed)

## 2018-09-01 NOTE — Telephone Encounter (Signed)
Please apply for bilateral knee visco, per Dr. Deveshwar. Thanks!  

## 2018-09-06 NOTE — Telephone Encounter (Signed)
Noted  

## 2018-09-11 DIAGNOSIS — L940 Localized scleroderma [morphea]: Secondary | ICD-10-CM | POA: Diagnosis not present

## 2018-09-11 DIAGNOSIS — Z79899 Other long term (current) drug therapy: Secondary | ICD-10-CM | POA: Diagnosis not present

## 2018-09-11 DIAGNOSIS — D649 Anemia, unspecified: Secondary | ICD-10-CM | POA: Diagnosis not present

## 2018-09-12 ENCOUNTER — Telehealth (INDEPENDENT_AMBULATORY_CARE_PROVIDER_SITE_OTHER): Payer: Self-pay

## 2018-09-12 NOTE — Telephone Encounter (Signed)
Submitted VOB for Synvisc series, bilateral knee. 

## 2018-10-05 ENCOUNTER — Telehealth (INDEPENDENT_AMBULATORY_CARE_PROVIDER_SITE_OTHER): Payer: Self-pay

## 2018-10-05 NOTE — Telephone Encounter (Signed)
Please schedule patient an appointment for gel injection with Dr. Estanislado Pandy or Lovena Le.  Thank you  Patient is approved for Synvisc series, bilateral knee. [place] Patient will be responsible for 20% OOP. Co-pay of $40.00 No PA required

## 2018-10-06 NOTE — Telephone Encounter (Signed)
LMOM for patient to call and schedule Synvisc injections.

## 2018-10-22 ENCOUNTER — Other Ambulatory Visit: Payer: Self-pay | Admitting: Family Medicine

## 2018-10-24 DIAGNOSIS — H40023 Open angle with borderline findings, high risk, bilateral: Secondary | ICD-10-CM | POA: Diagnosis not present

## 2018-10-24 DIAGNOSIS — H0102B Squamous blepharitis left eye, upper and lower eyelids: Secondary | ICD-10-CM | POA: Diagnosis not present

## 2018-10-24 DIAGNOSIS — H40053 Ocular hypertension, bilateral: Secondary | ICD-10-CM | POA: Diagnosis not present

## 2018-10-24 DIAGNOSIS — H20021 Recurrent acute iridocyclitis, right eye: Secondary | ICD-10-CM | POA: Diagnosis not present

## 2018-10-24 DIAGNOSIS — H0102A Squamous blepharitis right eye, upper and lower eyelids: Secondary | ICD-10-CM | POA: Diagnosis not present

## 2018-11-17 ENCOUNTER — Telehealth: Payer: Self-pay | Admitting: Rheumatology

## 2018-11-17 NOTE — Telephone Encounter (Signed)
Yes

## 2018-11-17 NOTE — Telephone Encounter (Signed)
Patient called stating she is ready to schedule her Synvisc injections.  Patient states her insurance is the same.  Since patient was approved in December, do we need to check her insurance before scheduling.

## 2018-11-20 ENCOUNTER — Encounter (HOSPITAL_COMMUNITY): Payer: Self-pay | Admitting: Internal Medicine

## 2018-11-20 ENCOUNTER — Ambulatory Visit (HOSPITAL_COMMUNITY)
Admission: RE | Admit: 2018-11-20 | Discharge: 2018-11-20 | Disposition: A | Discharge status: Home / Self Care | Payer: Medicare Other | Source: Ambulatory Visit | Attending: Internal Medicine | Admitting: Internal Medicine

## 2018-11-20 VITALS — BP 120/80 | HR 75 | Wt 172.2 lb

## 2018-11-20 DIAGNOSIS — I2584 Coronary atherosclerosis due to calcified coronary lesion: Secondary | ICD-10-CM

## 2018-11-20 DIAGNOSIS — I2510 Atherosclerotic heart disease of native coronary artery without angina pectoris: Secondary | ICD-10-CM

## 2018-11-20 DIAGNOSIS — M349 Systemic sclerosis, unspecified: Secondary | ICD-10-CM | POA: Diagnosis not present

## 2018-11-20 DIAGNOSIS — R5383 Other fatigue: Secondary | ICD-10-CM | POA: Diagnosis not present

## 2018-11-20 DIAGNOSIS — I350 Nonrheumatic aortic (valve) stenosis: Secondary | ICD-10-CM

## 2018-11-20 DIAGNOSIS — I10 Essential (primary) hypertension: Secondary | ICD-10-CM

## 2018-11-20 NOTE — Patient Instructions (Signed)
Your physician has recommended that you have a pulmonary function test. Pulmonary Function Tests are a group of tests that measure how well air moves in and out of your lungs.  You will have one in next couple weeks AND BEFORE your next follow up visit with Dr. Haroldine Laws.   Your physician has requested that you have an echocardiogram. Echocardiography is a painless test that uses sound waves to create images of your heart. It provides your doctor with information about the size and shape of your heart and how well your heart's chambers and valves are working. This procedure takes approximately one hour. There are no restrictions for this procedure.  You will have one in the next couple weeks AND 1 year at your next follow up visit with Dr. Haroldine Laws.    Your physician has recommended that you have a sleep study. This test records several body functions during sleep, including: brain activity, eye movement, oxygen and carbon dioxide blood levels, heart rate and rhythm, breathing rate and rhythm, the flow of air through your mouth and nose, snoring, body muscle movements, and chest and belly movement.  Your physician recommends that you schedule a follow-up appointment in: 1 year with Dr. Haroldine Laws with an ECHO at visit.

## 2018-11-20 NOTE — Progress Notes (Signed)
Patient ID: Rhonda Buchanan, female   DOB: 1946/08/13, 74 y.o.   MRN: [id number]   [facility]  NOTE  Referring Physician: Dr. Kirke Corin Primary Care: Dr. Glo Herring   HPI: Rhonda M Spoto is a 73 y.o. female with h/o lifelong HTN and morphea scleroderma who was referred by Dr. Patrecia Pour for screening for [facility].  She presents today for yearly follow up. Last seen 07/2017. She states she called in October, and was told it was not time for her follow up, and then hung up on. Her screening tests were not ordered for her appointment today.  Stopped ASA after a period of poor appetite and abdominal pain. Abd US unremarkable. Had some indigestion. Her pains improved after stopping ASA. Still having intermittent abdominal distention. Denies SOB or orthopnea. She has mild SOB on a "very windy day". No CP. No specific exercise, just does her ADLs. Taking all medications as directed. Wants to start at the [facility] now that is included in her health insurance. She reports daytime fatigue, but denies snoring.   09/2017 Stress Echo EF 65-70%, Grade 1 DD. No chest pain. + Hypertensive BP response. No stress induced WMAs.   08/12/2017 ECHO 60-65%. No evidence PAH. Mild AS Normal RV Personally reviewed  2017 Echo  EF 60-65% moderate LVH. Grade I DD. Very mild AS with fusion on non-coronary cusp. RV normal. No PH.   Hi-res CT of chest done 8/17 for abnormal DLCO 1. No evidence of interstitial lung disease. 2. No evidence of achalasia. 3. No acute findings in the thorax. 4. Aortic atherosclerosis, in addition to left main and 3 vessel coronary artery disease. Please note that although the presence of coronary artery calcium documents the presence of coronary artery disease, the severity of this disease and any potential stenosis cannot be assessed on this non-gated CT examination. Assessment for potential risk factor modification, dietary therapy or pharmacologic therapy may be warranted, if  clinically indicated. 5. There are calcifications of the aortic valve. Echocardiographic correlation for evaluation of potential valvular dysfunction may be warranted if clinically indicated.  2017  FEV1 2.07 (109%) FVC  2.54 (104%) DLCO 64%  PFTs (reviewed 08/12/17) -  FEV1 2.03 (109%) FVC  2.43 (101%) DLCO 60%    Past Medical History:  Diagnosis Date  . Allergy   . Arthritis   . Hypercholesterolemia   . Hypertension   . PUD (peptic ulcer disease)   . Scleroderma ([place])     Current Outpatient Medications  Medication Sig Dispense Refill  . Brinzolamide-Brimonidine (SIMBRINZA) 1-0.2 % SUSP Apply 1 drop to eye 3 (three) times daily.     . calcium carbonate (OS-CAL) 600 MG TABS Take 600 mg by mouth daily.      . fluocinonide ointment (LIDEX) 0.05 % Apply twice a day to affected areas    . folic acid (FOLVITE) 1 MG tablet Take 1 mg by mouth daily.    Marland Kitchen lisinopril-hydrochlorothiazide (PRINZIDE,ZESTORETIC) 10-12.5 MG tablet TAKE 1 TABLET BY MOUTH EVERY DAY 90 tablet 0  . loratadine (CLARITIN) 10 MG tablet Take 10 mg by mouth as needed.     . methotrexate (RHEUMATREX) 2.5 MG tablet Take 2.5 mg by mouth once a week. TAKE( 6 )15MG  ONCE A WEEK Caution:Chemotherapy. Protect from light.    . metoprolol succinate (TOPROL-XL) 50 MG 24 hr tablet TAKE 1 TABLET BY MOUTH DAILY. TAKE WITH OR IMMEDIATELY FOLLOWING A MEAL. 90 tablet 0  . multivitamin (THERAGRAN) per tablet Take 1 tablet by mouth  daily.      . nystatin (MYCOSTATIN/NYSTOP) powder Apply to moist areas as needed    . prednisoLONE acetate (PRED FORTE) 1 % ophthalmic suspension Place 1 drop into both eyes daily.     . rosuvastatin (CRESTOR) 40 MG tablet TAKE 1 TABLET BY MOUTH EVERY DAY 90 tablet 2  . timolol (BETIMOL) 0.5 % ophthalmic solution Place 1 drop into both eyes daily.    Marland Kitchen aspirin EC 81 MG tablet Take 1 tablet (81 mg total) by mouth daily. (Patient not taking: Reported on 05/16/2018) 90 tablet 3  . KLOR-CON 10 10 MEQ tablet  TAKE 2 TABLETS (20 MEQ TOTAL) DAILY BY MOUTH. (Patient not taking: Reported on 05/16/2018) 30 tablet 3   No current facility-administered medications for this encounter.    No Known Allergies  Social History   Socioeconomic History  . Marital status: Divorced    Spouse name: Not on file  . Number of children: Not on file  . Years of education: Not on file  . Highest education level: Not on file  Occupational History  . Not on file  Social Needs  . Financial resource strain: Not on file  . Food insecurity:    Worry: Not on file    Inability: Not on file  . Transportation needs:    Medical: Not on file    Non-medical: Not on file  Tobacco Use  . Smoking status: Never Smoker  . Smokeless tobacco: Never Used  Substance and Sexual Activity  . Alcohol use: No  . Drug use: No  . Sexual activity: Not Currently  Lifestyle  . Physical activity:    Days per week: Not on file    Minutes per session: Not on file  . Stress: Not on file  Relationships  . Social connections:    Talks on phone: Not on file    Gets together: Not on file    Attends religious service: Not on file    Active member of club or organization: Not on file    Attends meetings of clubs or organizations: Not on file    Relationship status: Not on file  . Intimate partner violence:    Fear of current or ex partner: Not on file    Emotionally abused: Not on file    Physically abused: Not on file    Forced sexual activity: Not on file  Other Topics Concern  . Not on file  Social History Narrative  . Not on file      Family History  Problem Relation Age of Onset  . Ulcers Brother   . Heart disease Brother 47       MI  . Cancer Father   . Ulcers Sister   . Cancer Maternal Grandmother   . Heart disease Maternal Grandfather     Vitals:   11/20/18 1135  BP: 120/80  Pulse: 75  SpO2: 98%  Weight: 78.1 kg (172 lb 3.2 oz)   Wt Readings from Last 3 Encounters:  11/20/18 78.1 kg (172 lb 3.2 oz)    09/01/18 80.4 kg (177 lb 3.2 oz)  05/16/18 80.6 kg (177 lb 9.6 oz)    PHYSICAL EXAM: General: Well appearing. No resp difficulty. HEENT: Normal Neck: Supple. JVP 5-6. Carotids 2+ bilat; no bruits. No thyromegaly or nodule noted. Cor: PMI nondisplaced. RRR, 2/6 SEM RUSB  S2 crisp. Accentuated P2. No TR or RV lift.  Lungs: CTAB, normal effort. Abdomen: Soft, non-tender, non-distended, no HSM. No bruits or masses. +BS  Extremities: No cyanosis, clubbing, rash, or edema. Diffuse arthritic changes.  Neuro: Alert & orientedx3, cranial nerves grossly intact. moves all 4 extremities w/o difficulty. Affect pleasant   ASSESSMENT & PLAN: 1. Scleroderma - ECHO 60-65%. RV normal Mild LVH.Grade I DD. No evidence PAH - 09/2017 Stress Echo EF 65-70%, Grade 1 DD. No chest pain. + Hypertensive BP response. No stress induced WMAs - She is due for her yearly Echo and PFTs. Will schedule.  2. HTN - Stable on current Toprol and lisinopril/hctz. 3. Very mild aortic stenosis - Echo with sclerosis, no stenosis.  4. Coronary calcifications - By CT scan. - 09/2017 Stress Echo EF 65-70%, Grade 1 DD. No chest pain. + Hypertensive BP response. No stress induced WMAs - No s/s of ischemia.    5. Fatigue - STOPBANG is at least 4. Recommend sleep study. She is willing to consider. Will refer.   Order Echo and PFTs for next few weeks. Refer for sleep study. Plan for continued yearly follow up with Echo/PFTs  Shirley Friar, PA-C  11:55 AM   Patient seen and examined with the above-signed Advanced Practice Provider and/or Housestaff. I personally reviewed laboratory data, imaging studies and relevant notes. I independently examined the patient and formulated the important aspects of the plan. I have edited the note to reflect any of my changes or salient points. I have personally discussed the plan with the patient and/or family.  She is due for her screening echo and PFTs. Will schedule for the next  week. No evidence of PAH on exam. Has mild AS murmur, will follow up on echo. Falls asleep easily and screening for OSA is positive. Will refer for PSG. Blood pressure well controlled. Continue current regimen.  Glori Bickers, MD  11:06 PM

## 2018-11-21 NOTE — Telephone Encounter (Signed)
See message below °

## 2018-11-23 ENCOUNTER — Telehealth (INDEPENDENT_AMBULATORY_CARE_PROVIDER_SITE_OTHER): Payer: Self-pay

## 2018-11-23 NOTE — Telephone Encounter (Signed)
Submitted VOB for Synvisc series, bilateral knee. 

## 2018-11-23 NOTE — Telephone Encounter (Signed)
Okay thank you!  I'll let the patient know we will call her when she is approved again.

## 2018-11-23 NOTE — Telephone Encounter (Signed)
Noted! Thank you

## 2018-11-29 ENCOUNTER — Telehealth (INDEPENDENT_AMBULATORY_CARE_PROVIDER_SITE_OTHER): Payer: Self-pay

## 2018-11-29 NOTE — Telephone Encounter (Signed)
Please schedule patient an appointment with Dr. Estanislado Pandy or Lovena Le for gel injection.  Thank you.   Patient is approved for Synvisc series, bilateral knee. Reserve Patient will be responsible for 20% OOP. Co-pay of $40.00 No PA required.

## 2018-12-01 ENCOUNTER — Other Ambulatory Visit: Payer: Self-pay | Admitting: Family Medicine

## 2018-12-06 ENCOUNTER — Ambulatory Visit (HOSPITAL_COMMUNITY)
Admission: RE | Admit: 2018-12-06 | Discharge: 2018-12-06 | Disposition: A | Discharge status: Home / Self Care | Payer: Medicare Other | Source: Ambulatory Visit | Attending: Internal Medicine | Admitting: Internal Medicine

## 2018-12-06 ENCOUNTER — Ambulatory Visit (HOSPITAL_BASED_OUTPATIENT_CLINIC_OR_DEPARTMENT_OTHER)
Admission: RE | Admit: 2018-12-06 | Discharge: 2018-12-06 | Disposition: A | Discharge status: Home / Self Care | Payer: Medicare Other | Source: Ambulatory Visit | Attending: Internal Medicine | Admitting: Internal Medicine

## 2018-12-06 DIAGNOSIS — E785 Hyperlipidemia, unspecified: Secondary | ICD-10-CM | POA: Insufficient documentation

## 2018-12-06 DIAGNOSIS — I2510 Atherosclerotic heart disease of native coronary artery without angina pectoris: Secondary | ICD-10-CM | POA: Diagnosis not present

## 2018-12-06 DIAGNOSIS — M349 Systemic sclerosis, unspecified: Secondary | ICD-10-CM | POA: Diagnosis not present

## 2018-12-06 DIAGNOSIS — R942 Abnormal results of pulmonary function studies: Secondary | ICD-10-CM | POA: Insufficient documentation

## 2018-12-06 DIAGNOSIS — I10 Essential (primary) hypertension: Secondary | ICD-10-CM | POA: Diagnosis not present

## 2018-12-06 DIAGNOSIS — I700 Atherosclerosis of aorta: Secondary | ICD-10-CM | POA: Diagnosis not present

## 2018-12-06 LAB — PULMONARY FUNCTION TEST
DL/VA % PRED: 82 %
DL/VA: 3.35 ml/min/mmHg/L
DLCO unc % pred: 70 %
DLCO unc: 14.05 ml/min/mmHg
FEF 25-75 POST: 2.5 L/s
FEF 25-75 Pre: 2.32 L/sec
FEF2575-%CHANGE-POST: 7 %
FEF2575-%PRED-POST: 155 %
FEF2575-%Pred-Pre: 143 %
FEV1-%Change-Post: 1 %
FEV1-%PRED-PRE: 111 %
FEV1-%Pred-Post: 113 %
FEV1-PRE: 2.03 L
FEV1-Post: 2.07 L
FEV1FVC-%CHANGE-POST: 0 %
FEV1FVC-%Pred-Pre: 108 %
FEV6-%CHANGE-POST: 1 %
FEV6-%PRED-POST: 108 %
FEV6-%Pred-Pre: 107 %
FEV6-PRE: 2.43 L
FEV6-Post: 2.46 L
FEV6FVC-%PRED-PRE: 104 %
FEV6FVC-%Pred-Post: 104 %
FVC-%CHANGE-POST: 1 %
FVC-%PRED-POST: 104 %
FVC-%Pred-Pre: 103 %
FVC-POST: 2.46 L
FVC-Pre: 2.43 L
POST FEV1/FVC RATIO: 84 %
POST FEV6/FVC RATIO: 100 %
PRE FEV1/FVC RATIO: 83 %
Pre FEV6/FVC Ratio: 100 %
RV % pred: 118 %
RV: 2.74 L
TLC % pred: 100 %
TLC: 5.25 L

## 2018-12-06 MED ORDER — ALBUTEROL SULFATE (2.5 MG/3ML) 0.083% IN NEBU
2.5000 mg | INHALATION_SOLUTION | Freq: Once | RESPIRATORY_TRACT | Status: AC
  Administered 2018-12-06: 2.5 mg via RESPIRATORY_TRACT

## 2018-12-08 ENCOUNTER — Telehealth (HOSPITAL_COMMUNITY): Payer: Self-pay

## 2018-12-08 NOTE — Telephone Encounter (Signed)
Pt called back for her test results. Verbalizes understanding.

## 2018-12-11 DIAGNOSIS — Z79899 Other long term (current) drug therapy: Secondary | ICD-10-CM | POA: Diagnosis not present

## 2018-12-12 ENCOUNTER — Telehealth (HOSPITAL_COMMUNITY): Payer: Self-pay

## 2018-12-12 NOTE — Telephone Encounter (Signed)
-----   Message from Jolaine Artist, MD sent at 12/08/2018  1:41 PM EST ----- Normal echo. Please let her know.

## 2018-12-12 NOTE — Telephone Encounter (Signed)
Pt made aware of results of ECHO and PFT's. ECHO normal and PFT's improved since last test. Pt appreciative.

## 2018-12-13 ENCOUNTER — Ambulatory Visit (INDEPENDENT_AMBULATORY_CARE_PROVIDER_SITE_OTHER): Payer: Medicare Other | Admitting: Rheumatology

## 2018-12-13 DIAGNOSIS — M170 Bilateral primary osteoarthritis of knee: Secondary | ICD-10-CM | POA: Diagnosis not present

## 2018-12-13 MED ORDER — LIDOCAINE HCL 1 % IJ SOLN
1.5000 mL | INTRAMUSCULAR | Status: AC | PRN
  Administered 2018-12-13: 1.5 mL

## 2018-12-13 MED ORDER — HYLAN G-F 20 16 MG/2ML IX SOSY
16.0000 mg | PREFILLED_SYRINGE | INTRA_ARTICULAR | Status: AC | PRN
  Administered 2018-12-13: 16 mg via INTRA_ARTICULAR

## 2018-12-13 NOTE — Progress Notes (Signed)
   Procedure Note  Patient: Rhonda Buchanan             Date of Birth: [birth year]           MRN: [id number]             Visit Date: 12/13/2018  Procedures: Visit Diagnoses: Primary osteoarthritis of both knees - Plan: Large Joint Inj: bilateral knee Synvisc #1 bilateral B/B Large Joint Inj: bilateral knee on 12/13/2018 1:54 PM Indications: pain Details: 25 G 1.5 in needle, medial approach  Arthrogram: No  Medications (Right): 16 mg Hylan 16 MG/2ML; 1.5 mL lidocaine 1 % Aspirate (Right): 0 mL Medications (Left): 16 mg Hylan 16 MG/2ML; 1.5 mL lidocaine 1 % Aspirate (Left): 0 mL Outcome: tolerated well, no immediate complications Procedure, treatment alternatives, risks and benefits explained, specific risks discussed. Consent was given by the patient. Immediately prior to procedure a time out was called to verify the correct patient, procedure, equipment, support staff and site/side marked as required. Patient was prepped and draped in the usual sterile fashion.      Patient tolerated the procedure well.   Bo Merino, MD

## 2018-12-20 ENCOUNTER — Ambulatory Visit (INDEPENDENT_AMBULATORY_CARE_PROVIDER_SITE_OTHER): Payer: Medicare Other | Admitting: Physician Assistant

## 2018-12-20 DIAGNOSIS — M170 Bilateral primary osteoarthritis of knee: Secondary | ICD-10-CM

## 2018-12-20 MED ORDER — LIDOCAINE HCL 1 % IJ SOLN
1.5000 mL | INTRAMUSCULAR | Status: AC | PRN
  Administered 2018-12-20: 1.5 mL

## 2018-12-20 MED ORDER — HYLAN G-F 20 16 MG/2ML IX SOSY
16.0000 mg | PREFILLED_SYRINGE | INTRA_ARTICULAR | Status: AC | PRN
  Administered 2018-12-20: 16 mg via INTRA_ARTICULAR

## 2018-12-20 NOTE — Progress Notes (Signed)
   Procedure Note  Patient: Rhonda Buchanan             Date of Birth: 1946/01/19           MRN: [id number]             Visit Date: 12/20/2018  Procedures: Visit Diagnoses: Primary osteoarthritis of both knees - Plan: Large Joint Inj: bilateral knee Synvisc #2 bilateral B/B Large Joint Inj: bilateral knee on 12/20/2018 12:09 PM Indications: pain Details: 25 G 1.5 in needle, medial approach  Arthrogram: No  Medications (Right): 16 mg Hylan 16 MG/2ML; 1.5 mL lidocaine 1 % Aspirate (Right): 0 mL Medications (Left): 16 mg Hylan 16 MG/2ML; 1.5 mL lidocaine 1 % Aspirate (Left): 0 mL Outcome: tolerated well, no immediate complications Procedure, treatment alternatives, risks and benefits explained, specific risks discussed. Consent was given by the patient. Immediately prior to procedure a time out was called to verify the correct patient, procedure, equipment, support staff and site/side marked as required. Patient was prepped and draped in the usual sterile fashion.     Patient tolerated the procedure well.  Hazel Sams, PA-C

## 2018-12-21 ENCOUNTER — Encounter: Payer: Self-pay | Admitting: Family Medicine

## 2018-12-21 ENCOUNTER — Ambulatory Visit (INDEPENDENT_AMBULATORY_CARE_PROVIDER_SITE_OTHER): Payer: Medicare Other | Admitting: Family Medicine

## 2018-12-21 VITALS — BP 126/82 | HR 65 | Temp 98.0°F | Wt 172.4 lb

## 2018-12-21 DIAGNOSIS — I10 Essential (primary) hypertension: Secondary | ICD-10-CM

## 2018-12-21 DIAGNOSIS — Z1211 Encounter for screening for malignant neoplasm of colon: Secondary | ICD-10-CM

## 2018-12-21 DIAGNOSIS — Z131 Encounter for screening for diabetes mellitus: Secondary | ICD-10-CM

## 2018-12-21 DIAGNOSIS — H20023 Recurrent acute iridocyclitis, bilateral: Secondary | ICD-10-CM

## 2018-12-21 DIAGNOSIS — J301 Allergic rhinitis due to pollen: Secondary | ICD-10-CM

## 2018-12-21 DIAGNOSIS — Z6379 Other stressful life events affecting family and household: Secondary | ICD-10-CM

## 2018-12-21 DIAGNOSIS — M349 Systemic sclerosis, unspecified: Secondary | ICD-10-CM | POA: Diagnosis not present

## 2018-12-21 DIAGNOSIS — I2510 Atherosclerotic heart disease of native coronary artery without angina pectoris: Secondary | ICD-10-CM | POA: Diagnosis not present

## 2018-12-21 DIAGNOSIS — M1990 Unspecified osteoarthritis, unspecified site: Secondary | ICD-10-CM

## 2018-12-21 DIAGNOSIS — E785 Hyperlipidemia, unspecified: Secondary | ICD-10-CM

## 2018-12-21 DIAGNOSIS — Z0000 Encounter for general adult medical examination without abnormal findings: Secondary | ICD-10-CM | POA: Diagnosis not present

## 2018-12-21 LAB — POCT URINALYSIS DIP (PROADVANTAGE DEVICE)
BILIRUBIN UA: NEGATIVE mg/dL
Bilirubin, UA: NEGATIVE
GLUCOSE UA: NEGATIVE mg/dL
Leukocytes, UA: NEGATIVE
Nitrite, UA: NEGATIVE
SPECIFIC GRAVITY, URINE: 1.025
Urobilinogen, Ur: 3.5
pH, UA: 6 (ref 5.0–8.0)

## 2018-12-21 MED ORDER — LISINOPRIL-HYDROCHLOROTHIAZIDE 10-12.5 MG PO TABS: 1.0000 | ORAL_TABLET | Freq: Every day | ORAL | 3 refills | Status: DC

## 2018-12-21 MED ORDER — METOPROLOL SUCCINATE ER 50 MG PO TB24: ORAL_TABLET | ORAL | 3 refills | Status: DC

## 2018-12-21 MED ORDER — ROSUVASTATIN CALCIUM 40 MG PO TABS: 40.0000 mg | ORAL_TABLET | Freq: Every day | ORAL | 3 refills | Status: DC

## 2018-12-21 NOTE — Progress Notes (Signed)
Rhonda Buchanan is a 73 y.o. female who presents for annual wellness visit,CPE and follow-up on chronic medical conditions.  She has scleroderma and is seeing a dermatologist for that.  She also has rheumatoid arthritis and recently had both of her knees injected.  Her allergies seem to be under good control.  She continues on metoprolol as well as lisinopril/HCTZ.  She is taking Crestor without any difficulty.  Presently she is taking methotrexate as well as folic acid.  She is under a lot of stress at home.  Her sister is living with her who apparently has some psychological issues as well as memory issues.  This does have her quite stressed.  She continues to be followed by ophthalmology for her iridocyclitis  Immunizations and Health Maintenance Immunization History  Administered Date(s) Administered  . H1N1 08/22/2009  . Influenza, High Dose Seasonal PF 06/24/2015, 10/05/2016  . Influenza,inj,Quad PF,6+ Mos 07/12/2013, 01/15/2015  . Influenza-Unspecified 12/24/2017  . Pneumococcal Conjugate-13 05/20/2014  . Pneumococcal Polysaccharide-23 01/31/2007  . Tdap 01/31/2007  . Zoster 07/02/2008   Health Maintenance Due  Topic Date Due  . TETANUS/TDAP  01/30/2017  . INFLUENZA VACCINE  05/18/2018  . COLONOSCOPY  07/17/2018  . MAMMOGRAM  09/01/2018    Last Pap smear: aged out  Last mammogram:09-01-16 Last colonoscopy:07-17-08 Last DEXA:04/04/13 Dentist: Ophtho:Q three months Exercise: Minimal  Other doctors caring for patient include: Dr. Ileana Roup. , Dr. Myrle Sheng cardio, Dr sayed dermat.  Advanced directives:yes copy asked for Does Patient Have a Medical Advance Directive?: Yes Type of Advance Directive: Healthcare Power of Attorney, Living will Does patient want to make changes to medical advance directive?: No - Patient declined Copy of [place] in Chart?: No - copy requested  Depression screen:  See questionnaire below.  Depression screen [facility] 2/9 12/21/2018  10/19/2017 10/05/2016 06/24/2015 03/20/2013  Decreased Interest 3 0 0 0 0  Down, Depressed, Hopeless 1 0 0 0 0  PHQ - 2 Score 4 0 0 0 0  Altered sleeping 3 - - - -  Tired, decreased energy 3 - - - -  Change in appetite 0 - - - -  Feeling bad or failure about yourself  0 - - - -  Trouble concentrating 3 - - - -  Moving slowly or fidgety/restless 0 - - - -  Suicidal thoughts 0 - - - -  PHQ-9 Score 13 - - - -  Difficult doing work/chores Somewhat difficult - - - -    Fall Risk Screen: see questionnaire below. Fall Risk  12/21/2018 10/19/2017 10/05/2016 06/24/2015 04/30/2015  Falls in the past year? 0 No Yes No No  Number falls in past yr: - - 1 - -  Injury with Fall? - - No - -  Risk for fall due to : - - Other (Comment) - -  Risk for fall due to: Comment - - she had vertigo - -    ADL screen:  See questionnaire below Functional Status Survey: Is the patient deaf or have difficulty hearing?: No Does the patient have difficulty seeing, even when wearing glasses/contacts?: No Does the patient have difficulty concentrating, remembering, or making decisions?: No Does the patient have difficulty walking or climbing stairs?: Yes(knee issues that is being treated) Does the patient have difficulty dressing or bathing?: No Does the patient have difficulty doing errands alone such as visiting a doctor's office or shopping?: No Family and social history was also reviewed  Review of Systems Constitutional: -, -unexpected  weight change, -anorexia, -fatigue Allergy: -sneezing, -itching, -congestion Dermatology: denies changing moles, rash, lumps ENT: -runny nose, -ear pain, -sore throat,  Cardiology:  -chest pain, -palpitations, -orthopnea, Respiratory: -cough, -shortness of breath, -dyspnea on exertion, -wheezing,  Gastroenterology: -abdominal pain, -nausea, -vomiting, -diarrhea, -constipation, -dysphagia Hematology: -bleeding or bruising problems Musculoskeletal: -arthralgias, -myalgias, -joint  swelling, -back pain, - Ophthalmology: -vision changes,  Urology: -dysuria, -difficulty urinating,  -urinary frequency, -urgency, incontinence Neurology: -, -numbness, , -memory loss, -falls, -dizziness    PHYSICAL EXAM:  BP 126/82 (BP Location: Left Arm, Patient Position: Sitting)   Pulse 65   Temp 98 F (36.7 C)   Wt 172 lb 6.4 oz (78.2 kg)   LMP 02/16/2003   SpO2 99%   BMI 27.41 kg/m   General Appearance: Alert, cooperative, no distress, appears stated age Head: Normocephalic, without obvious abnormality, atraumatic Eyes: PERRL, conjunctiva/corneas clear, EOM's intact, fundi benign Ears: Normal TM's and external ear canals Nose: Nares normal, mucosa normal, no drainage or sinus tenderness Throat: Lips, mucosa, and tongue normal; teeth and gums normal Neck: Supple, no lymphadenopathy;  thyroid:  no enlargement/tenderness/nodules; no carotid bruit or JVD Lungs: Clear to auscultation bilaterally without wheezes, rales or ronchi; respirations unlabored Heart: Regular rate and rhythm, S1 and S2 normal, no murmur, rubor gallop Abdomen: Soft, non-tender, nondistended, normoactive bowel sounds,  no masses, no hepatosplenomegaly Extremities: No clubbing, cyanosis or edema Pulses: 2+ and symmetric all extremities Skin:  Skin color, texture, turgor normal, no rashes or lesions Lymph nodes: Cervical, supraclavicular, and axillary nodes normal Neurologic:  CNII-XII intact, normal strength, sensation and gait; reflexes 2+ and symmetric throughout Psych: Normal mood, affect, hygiene and grooming. Urine microscopic negative ASSESSMENT/PLAN: Routine general medical examination at a health care facility - Plan: CBC with Differential/Platelet, Comprehensive metabolic panel, Lipid panel, POCT Urinalysis DIP (Proadvantage Device)  Screening for colon cancer - Plan: Cologuard  Scleroderma ([place])  Atherosclerosis of coronary artery of native heart without angina pectoris, unspecified vessel or  lesion type  Allergic rhinitis due to pollen, unspecified seasonality  Essential hypertension - Plan: CBC with Differential/Platelet, Comprehensive metabolic panel  Arthritis  Hyperlipidemia, unspecified hyperlipidemia type - Plan: Lipid panel  Recurrent acute iridocyclitis of both eyes  Stress due to illness of family member She will continue on her present medication regimen and follow-up with her various specialist as needed. Cologuard was  ordered. I then discussed the stress that she is under dealing with her sister who apparently does have some underlying mental issues as well as memory problems.  Discussed the possibility of counseling but also recommend that she talk to social services to see if there are any particular programs that her sister might qualify for.    Immunization recommendations discussed.  Colonoscopy recommendations reviewed   Medicare Attestation I have personally reviewed: The patient's medical and social history Their use of alcohol, tobacco or illicit drugs Their current medications and supplements The patient's functional ability including ADLs,fall risks, home safety risks, cognitive, and hearing and visual impairment Diet and physical activities Evidence for depression or mood disorders  The patient's weight, height, and BMI have been recorded in the chart.  I have made referrals, counseling, and provided education to the patient based on review of the above and I have provided the patient with a written personalized care plan for preventive services.     Jill Alexanders, MD   12/21/2018

## 2018-12-22 LAB — COMPREHENSIVE METABOLIC PANEL
ALT: 24 IU/L (ref 0–32)
AST: 21 IU/L (ref 0–40)
Albumin/Globulin Ratio: 2 (ref 1.2–2.2)
Albumin: 4.5 g/dL (ref 3.7–4.7)
Alkaline Phosphatase: 69 IU/L (ref 39–117)
BILIRUBIN TOTAL: 0.7 mg/dL (ref 0.0–1.2)
BUN/Creatinine Ratio: 16 (ref 12–28)
BUN: 17 mg/dL (ref 8–27)
CHLORIDE: 98 mmol/L (ref 96–106)
CO2: 25 mmol/L (ref 20–29)
Calcium: 10.2 mg/dL (ref 8.7–10.3)
Creatinine, Ser: 1.05 mg/dL — ABNORMAL HIGH (ref 0.57–1.00)
GFR calc non Af Amer: 52 mL/min/{1.73_m2} — ABNORMAL LOW (ref >59)
GFR, EST AFRICAN AMERICAN: 60 mL/min/{1.73_m2} (ref >59)
GLUCOSE: 114 mg/dL — AB (ref 65–99)
Globulin, Total: 2.3 g/dL (ref 1.5–4.5)
Potassium: 3.2 mmol/L — ABNORMAL LOW (ref 3.5–5.2)
Sodium: 140 mmol/L (ref 134–144)
TOTAL PROTEIN: 6.8 g/dL (ref 6.0–8.5)

## 2018-12-22 LAB — CBC WITH DIFFERENTIAL/PLATELET
BASOS ABS: 0 10*3/uL (ref 0.0–0.2)
Basos: 1 %
EOS (ABSOLUTE): 0.1 10*3/uL (ref 0.0–0.4)
Eos: 2 %
Hematocrit: 36.1 % (ref 34.0–46.6)
Hemoglobin: 12.8 g/dL (ref 11.1–15.9)
IMMATURE GRANS (ABS): 0 10*3/uL (ref 0.0–0.1)
IMMATURE GRANULOCYTES: 0 %
LYMPHS: 27 %
Lymphocytes Absolute: 1.3 10*3/uL (ref 0.7–3.1)
MCH: 32 pg (ref 26.6–33.0)
MCHC: 35.5 g/dL (ref 31.5–35.7)
MCV: 90 fL (ref 79–97)
Monocytes Absolute: 0.5 10*3/uL (ref 0.1–0.9)
Monocytes: 10 %
NEUTROS ABS: 3 10*3/uL (ref 1.4–7.0)
NEUTROS PCT: 60 %
PLATELETS: 236 10*3/uL (ref 150–450)
RBC: 4 x10E6/uL (ref 3.77–5.28)
RDW: 14.7 % (ref 11.7–15.4)
WBC: 4.9 10*3/uL (ref 3.4–10.8)

## 2018-12-22 LAB — LIPID PANEL
CHOLESTEROL TOTAL: 236 mg/dL — AB (ref 100–199)
Chol/HDL Ratio: 3.4 ratio (ref 0.0–4.4)
HDL: 70 mg/dL (ref >39)
LDL Calculated: 147 mg/dL — ABNORMAL HIGH (ref 0–99)
TRIGLYCERIDES: 97 mg/dL (ref 0–149)
VLDL CHOLESTEROL CAL: 19 mg/dL (ref 5–40)

## 2018-12-22 NOTE — Addendum Note (Signed)
Addended by: Denita Lung on: 12/22/2018 03:59 PM   Modules accepted: Orders

## 2018-12-27 ENCOUNTER — Other Ambulatory Visit: Payer: Self-pay

## 2018-12-27 ENCOUNTER — Ambulatory Visit (INDEPENDENT_AMBULATORY_CARE_PROVIDER_SITE_OTHER): Payer: Medicare Other | Admitting: Physician Assistant

## 2018-12-27 DIAGNOSIS — M170 Bilateral primary osteoarthritis of knee: Secondary | ICD-10-CM | POA: Diagnosis not present

## 2018-12-27 MED ORDER — HYLAN G-F 20 16 MG/2ML IX SOSY
16.0000 mg | PREFILLED_SYRINGE | INTRA_ARTICULAR | Status: AC | PRN
  Administered 2018-12-27: 16 mg via INTRA_ARTICULAR

## 2018-12-27 MED ORDER — LIDOCAINE HCL 1 % IJ SOLN
1.5000 mL | INTRAMUSCULAR | Status: AC | PRN
  Administered 2018-12-27: 1.5 mL

## 2018-12-27 NOTE — Progress Notes (Signed)
   Procedure Note  Patient: [place] M Purpura             Date of Birth: 01-12-1946           MRN: [id number]             Visit Date: 12/27/2018  Procedures: Visit Diagnoses: Primary osteoarthritis of both knees - Plan: Large Joint Inj: bilateral knee Synvisc #3 bilateral knee joint injections  Large Joint Inj: bilateral knee on 12/27/2018 12:26 PM Indications: pain Details: 25 G 1.5 in needle, medial approach  Arthrogram: No  Medications (Right): 16 mg Hylan 16 MG/2ML; 1.5 mL lidocaine 1 % Aspirate (Right): 0 mL Medications (Left): 16 mg Hylan 16 MG/2ML; 1.5 mL lidocaine 1 % Aspirate (Left): 0 mL Outcome: tolerated well, no immediate complications Procedure, treatment alternatives, risks and benefits explained, specific risks discussed. Consent was given by the patient. Immediately prior to procedure a time out was called to verify the correct patient, procedure, equipment, support staff and site/side marked as required. Patient was prepped and draped in the usual sterile fashion.    Patient tolerated the procedure well.  Hazel Sams, PA-C

## 2018-12-28 LAB — SPECIMEN STATUS REPORT

## 2018-12-28 LAB — HGB A1C W/O EAG: Hgb A1c MFr Bld: 5.4 % (ref 4.8–5.6)

## 2018-12-30 IMAGING — US US ABDOMEN LIMITED
1 series · 14 of 25 positions shown · non-contrast
Comparison: None.

CLINICAL DATA: Right upper quadrant abdominal pain.

EXAM:
ULTRASOUND ABDOMEN LIMITED RIGHT UPPER QUADRANT

[Series 1: us abdomen limited · 0.12mm/px · 14 of 45 slices shown]
[im 1/45]
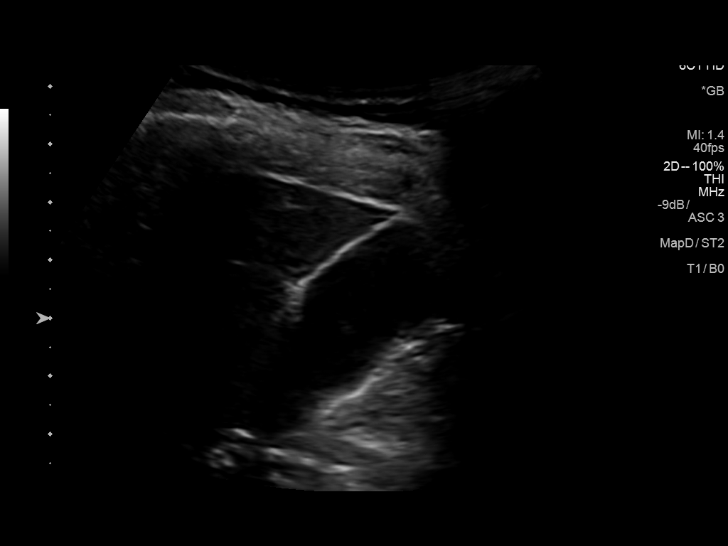
[im 4/45]
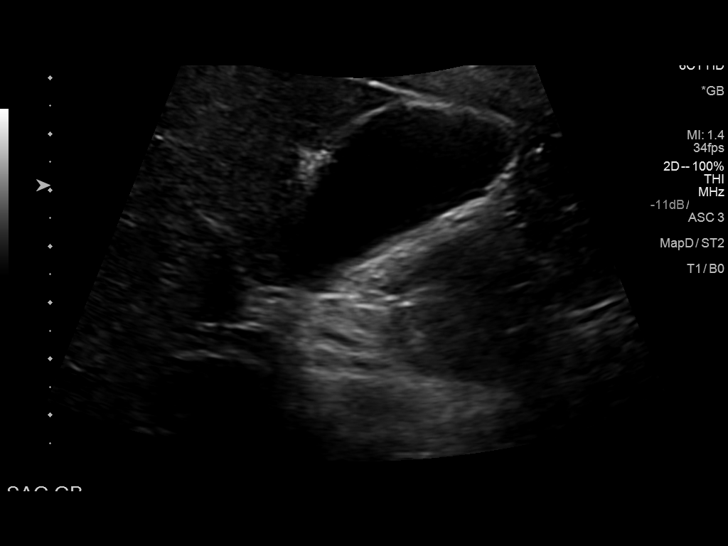
[im 8/45]
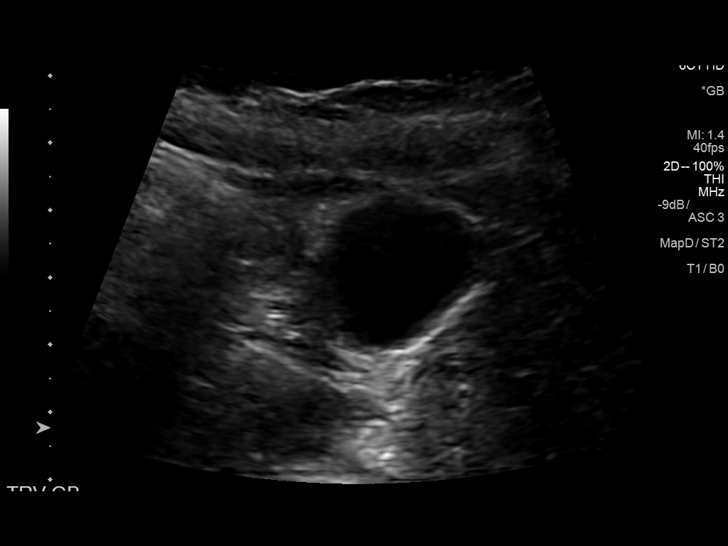
[im 12/45]
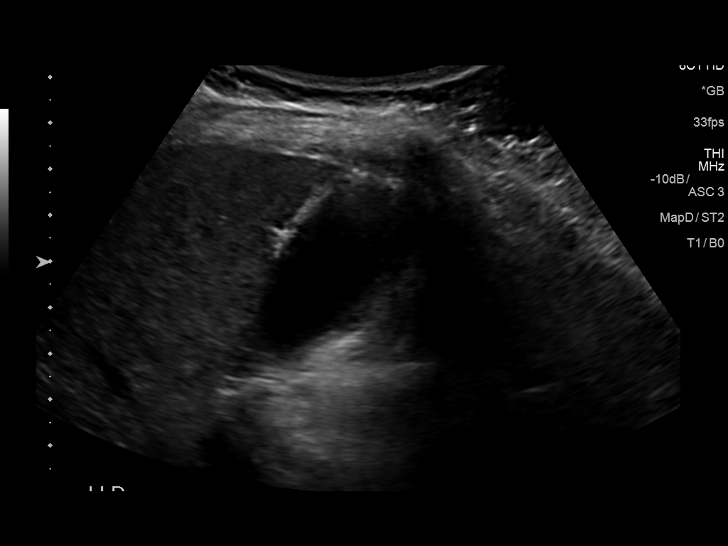
[im 15/45]
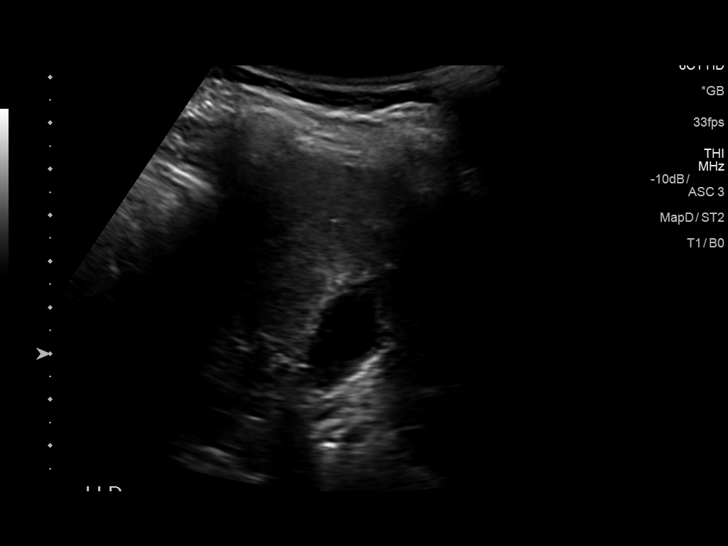
[im 17/45]
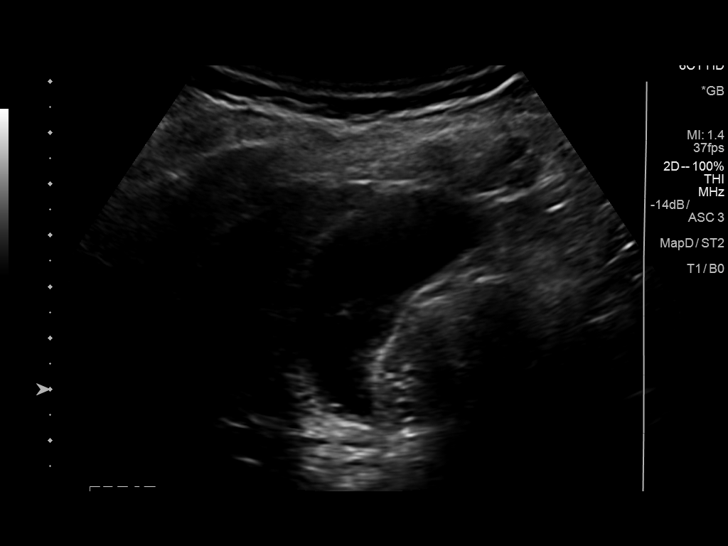
[im 21/45]
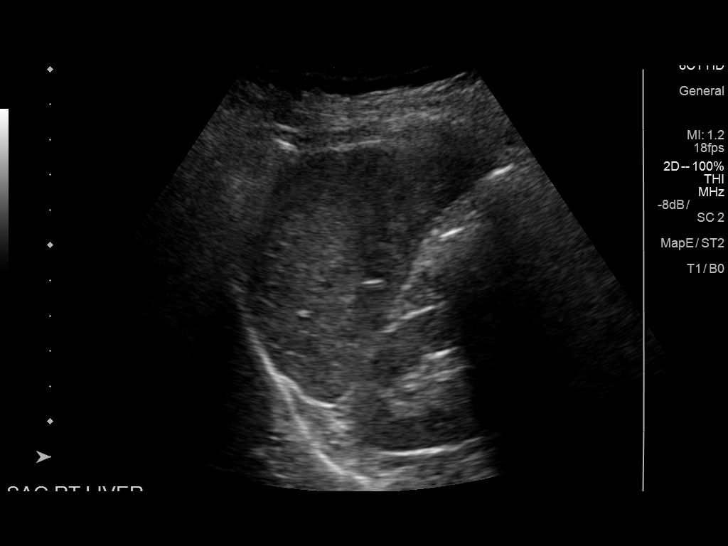
[im 24/45]
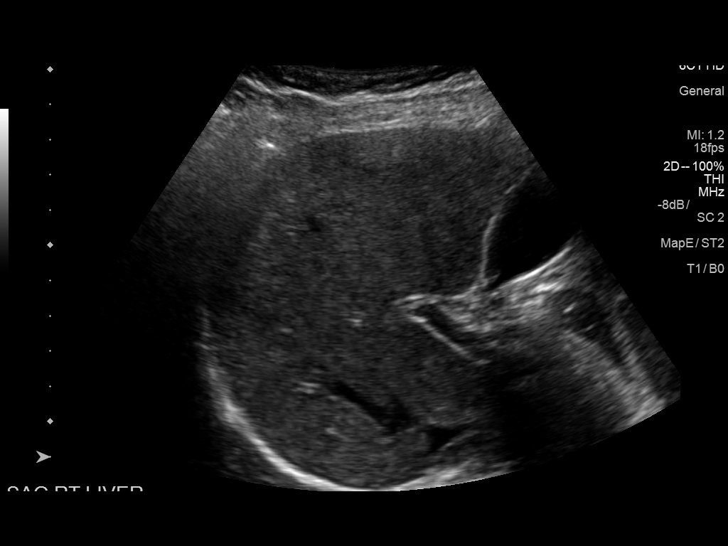
[im 28/45]
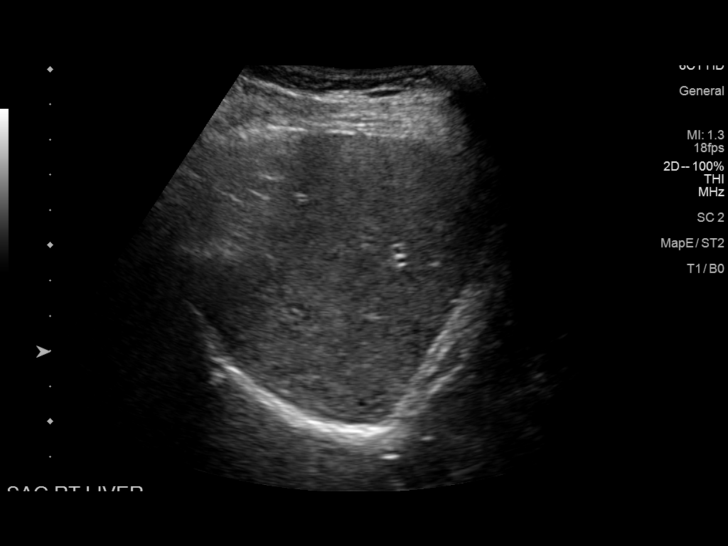
[im 30/45]
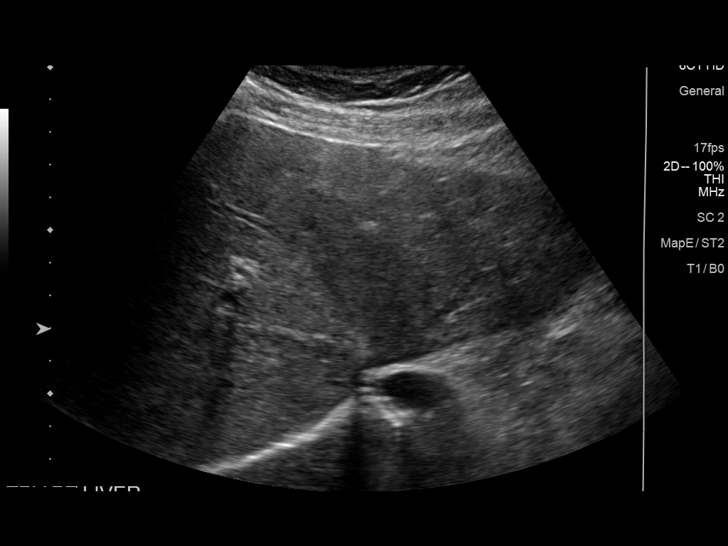
[im 34/45]
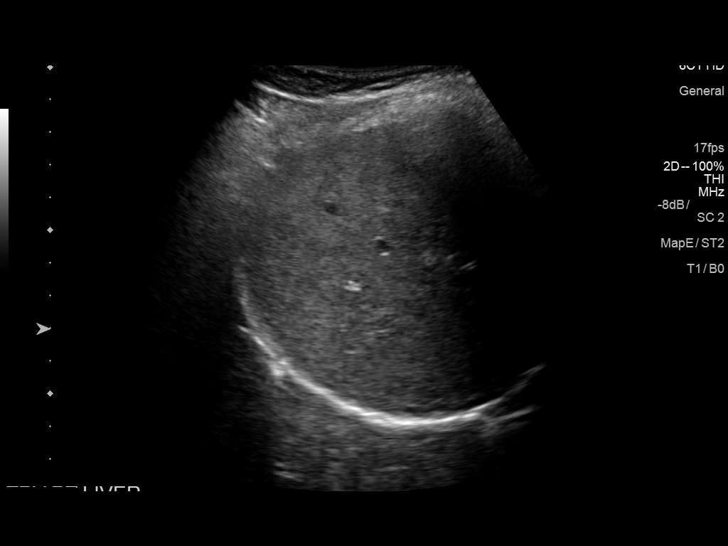
[im 37/45]
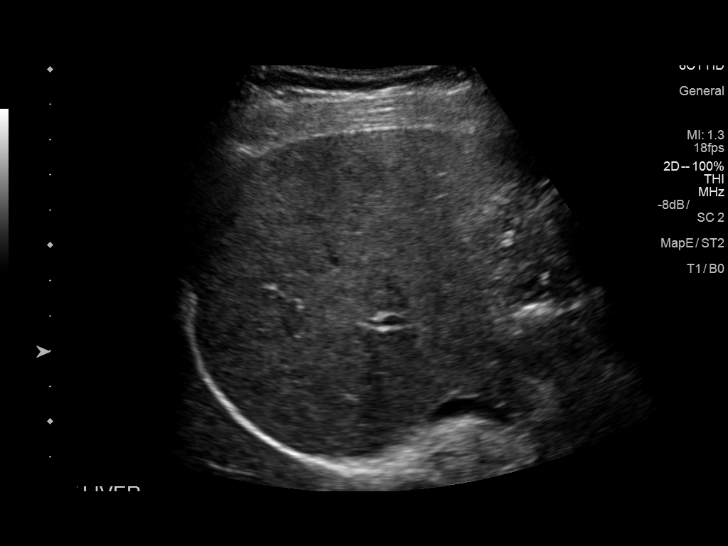
[im 41/45]
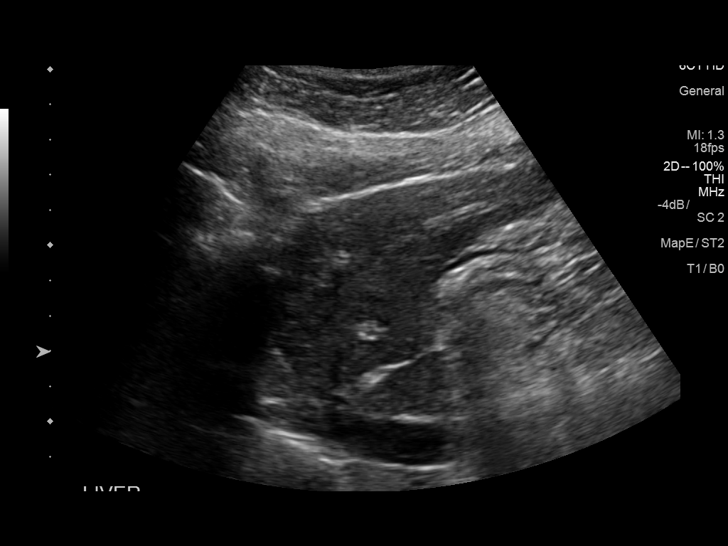
[im 45/45]
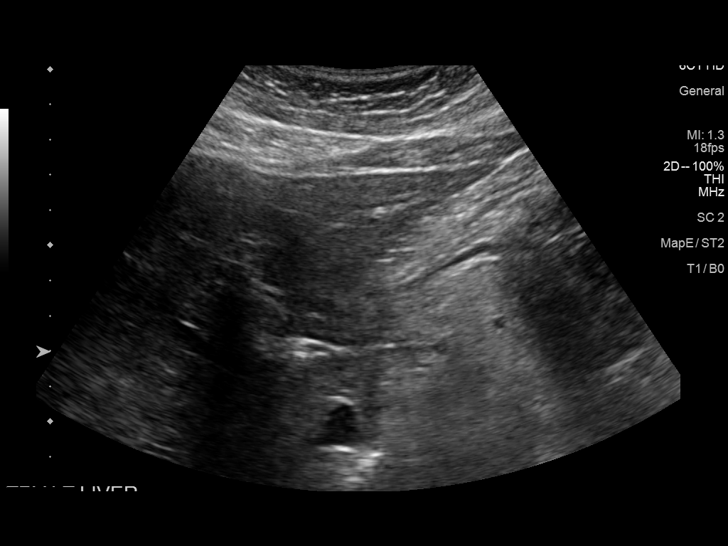

[14 of 25 positions shown; findings below may reference images not displayed]

FINDINGS: Gallbladder:

No gallstones or wall thickening visualized. No sonographic Murphy
sign noted by sonographer.

Common bile duct:

Diameter: 3 mm which is within normal limits.

Liver:

No focal lesion identified. Within normal limits in parenchymal
echogenicity. Portal vein is patent on color Doppler imaging with
normal direction of blood flow towards the liver.
IMPRESSION: No definite abnormality seen in the right upper quadrant of the
abdomen.

## 2019-01-09 ENCOUNTER — Encounter (HOSPITAL_BASED_OUTPATIENT_CLINIC_OR_DEPARTMENT_OTHER): Payer: Medicare Other

## 2019-02-04 ENCOUNTER — Encounter (HOSPITAL_BASED_OUTPATIENT_CLINIC_OR_DEPARTMENT_OTHER): Payer: Medicare Other

## 2019-02-08 ENCOUNTER — Encounter: Payer: Self-pay | Admitting: Family Medicine

## 2019-02-20 IMAGING — NM NM HEPATO W/GB/PHARM/[PERSON_NAME]
2 series · 12 of 12 positions shown · non-contrast
Comparison: None

CLINICAL DATA: RIGHT upper quadrant pain for 1 month

EXAM:
NUCLEAR MEDICINE HEPATOBILIARY IMAGING WITH GALLBLADDER EF
TECHNIQUE: Sequential images of the abdomen were obtained [DATE] minutes
following intravenous administration of radiopharmaceutical. After
oral ingestion of Ensure, gallbladder ejection fraction was
determined. At 60 min, normal ejection fraction is greater than 33%.
RADIOPHARMACEUTICALS:  5.2 mCi 5c-QQm  Choletec IV

[Series 1: biliary · 3.25mm/px · 6 of 60 frames shown]
[frame 6/60]
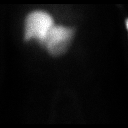
[frame 16/60]
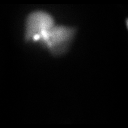
[frame 26/60]
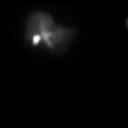
[frame 36/60]
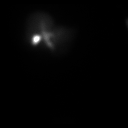
[frame 46/60]
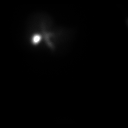
[frame 56/60]
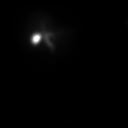

[Series 2: gbef · 3.25mm/px · 6 of 60 frames shown]
[frame 6/60]
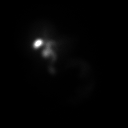
[frame 16/60]
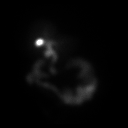
[frame 26/60]
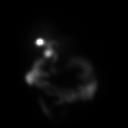
[frame 36/60]
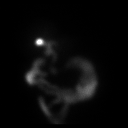
[frame 46/60]
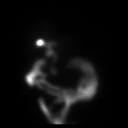
[frame 56/60]
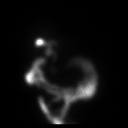

[12 of 12 positions shown; findings below may reference images not displayed]

FINDINGS: Normal tracer extraction from bloodstream indicating normal
hepatocellular function.

Normal excretion of tracer into biliary tree.

Gallbladder visualized at 18 min.

Small bowel not visualized until following fatty meal stimulation

No hepatic retention of tracer.

Subjectively normal emptying of tracer from gallbladder following
fatty meal stimulation.

Calculated gallbladder ejection fraction is 84%, normal.

Patient reported no symptoms following Ensure ingestion.

Normal gallbladder ejection fraction following Ensure ingestion is
greater than 33% at 1 hour.
IMPRESSION: Normal exam

## 2019-02-26 ENCOUNTER — Telehealth: Payer: Self-pay | Admitting: *Deleted

## 2019-02-26 DIAGNOSIS — I10 Essential (primary) hypertension: Secondary | ICD-10-CM

## 2019-02-26 NOTE — Telephone Encounter (Signed)
Per Dr Radford Pax change order from split night to NPSG.[name] patient

## 2019-02-27 ENCOUNTER — Ambulatory Visit: Payer: Self-pay | Admitting: Rheumatology

## 2019-03-01 ENCOUNTER — Encounter (HOSPITAL_BASED_OUTPATIENT_CLINIC_OR_DEPARTMENT_OTHER): Payer: Medicare Other

## 2019-03-13 DIAGNOSIS — Z79899 Other long term (current) drug therapy: Secondary | ICD-10-CM | POA: Diagnosis not present

## 2019-03-14 NOTE — Progress Notes (Signed)
Office Visit Note  Patient: Rhonda Buchanan             Date of Birth: June 01, 1946           MRN: [id number]             PCP: Denita Lung, MD Referring: Denita Lung, MD Visit Date: 03/27/2019 Occupation: @GUAROCC @  Subjective:  Raynaud's     History of Present Illness: Rhonda Buchanan is a 73 y.o. female with history of osteoarthritis, scleroderma, and Raynaud's.  Rhonda Buchanan is on methotrexate 15 mg by mouth weekly and folic acid 2 mg by mouth daily which is prescribed by her dermatologist.  Rhonda Buchanan continues to follow up with her dermatologist on a regular basis. Rhonda Buchanan denies any increased skin thickness or skin tightness.  Rhonda Buchanan has intermittent symptoms of Raynaud's and some diminished sensation in her fingertips.  Rhonda Buchanan denies any digital ulcerations or signs or gangrene.  Rhonda Buchanan was experiencing increased callus formation on both hands but has noticed improvement since using vasoline.  Rhonda Buchanan denies any iridocyclitis flares.  Rhonda Buchanan denies any eye pain or photophobia.  Rhonda Buchanan reports over the past several months Rhonda Buchanan has been experiencing left wrist joint pain.  Rhonda Buchanan was having difficulty lifting heavy objects.  Rhonda Buchanan denies any joint swelling.  Rhonda Buchanan states the pain has improved recently.  Rhonda Buchanan had visco gel injections in both knee joints in March, which provided significant pain relief. Rhonda Buchanan denies any knee joint swelling.     Activities of Daily Living:  Patient reports morning stiffness for 15 minute.   Patient Denies nocturnal pain.  Difficulty dressing/grooming: Denies Difficulty climbing stairs: Reports Difficulty getting out of chair: Reports Difficulty using hands for taps, buttons, cutlery, and/or writing: Denies  Review of Systems  Constitutional: Positive for fatigue.  HENT: Positive for mouth dryness. Negative for mouth sores and nose dryness.   Eyes: Positive for dryness. Negative for pain and visual disturbance.  Respiratory: Negative for cough, hemoptysis, shortness of breath, wheezing  and difficulty breathing.   Cardiovascular: Positive for swelling in legs/feet. Negative for chest pain, palpitations and hypertension.  Gastrointestinal: Negative for abdominal pain, blood in stool, constipation and diarrhea.  Endocrine: Negative for excessive thirst and increased urination.  Genitourinary: Negative for difficulty urinating and painful urination.  Musculoskeletal: Positive for arthralgias, joint pain and morning stiffness. Negative for joint swelling, myalgias, muscle weakness, muscle tenderness and myalgias.  Skin: Negative for color change, pallor, rash, hair loss, nodules/bumps, redness, skin tightness, ulcers and sensitivity to sunlight.  Neurological: Negative for numbness, headaches and weakness.  Hematological: Negative for bruising/bleeding tendency and swollen glands.  Psychiatric/Behavioral: Negative for depressed mood and sleep disturbance. The patient is not nervous/anxious.     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   Atherosclerosis of coronary artery of native heart without angina pectoris 10/05/2016   Aortic stenosis 05/21/2016   Abnormal PFTs 05/21/2016   Asthma due to environmental allergies 06/24/2015   Positive PPD, treated 06/05/2015   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/14/2014   Scleroderma ([place]) 07/12/2013   Hypertension 03/04/2011   Hyperlipidemia 03/04/2011   Allergic rhinitis 03/04/2011   Arthritis 03/04/2011   Migraine variant 04/18/1995    Past Medical History:  Diagnosis Date   Allergy    Arthritis    Hypercholesterolemia    Hypertension    PUD (peptic ulcer disease)    Scleroderma ([place])     Family History  Problem Relation Age of Onset   Ulcers Brother    Heart disease Brother 85       MI   Cancer Father    Ulcers Sister    Cancer Maternal Grandmother    Heart disease Maternal Grandfather    Past Surgical History:  Procedure  Laterality Date   arthroscopy     CYST REMOVAL HAND     EYE SURGERY Left 10/25/2017   laser surgery    KNEE ARTHROPLASTY     Social History   Social History Narrative   Not on file   Immunization History  Administered Date(s) Administered   H1N1 08/22/2009   Influenza, High Dose Seasonal PF 06/24/2015, 10/05/2016   Influenza,inj,Quad PF,6+ Mos 07/12/2013, 01/15/2015   Influenza-Unspecified 12/24/2017, 08/04/2018   Pneumococcal Conjugate-13 05/20/2014   Pneumococcal Polysaccharide-23 01/31/2007   Tdap 01/31/2007   Zoster 07/02/2008     Objective: Vital Signs: BP 113/67 (BP Location: Left Arm, Patient Position: Sitting, Cuff Size: Normal)    Pulse 72    Resp 12    Ht 5' 6.5" (1.689 m)    Wt 175 lb (79.4 kg)    LMP 02/16/2003    BMI 27.82 kg/m    Physical Exam Vitals signs and nursing note reviewed.  Constitutional:      Appearance: Rhonda Buchanan is well-developed.  HENT:     Head: Normocephalic and atraumatic.  Eyes:     Conjunctiva/sclera: Conjunctivae normal.  Neck:     Musculoskeletal: Normal range of motion.  Cardiovascular:     Rate and Rhythm: Normal rate and regular rhythm.     Heart sounds: Normal heart sounds.  Pulmonary:     Effort: Pulmonary effort is normal.     Breath sounds: Normal breath sounds.  Abdominal:     General: Bowel sounds are normal.     Palpations: Abdomen is soft.  Lymphadenopathy:     Cervical: No cervical adenopathy.  Skin:    General: Skin is warm and dry.     Capillary Refill: Capillary refill takes less than 2 seconds.     Comments: Morphea on torso  Neurological:     Mental Status: Rhonda Buchanan is alert and oriented to person, place, and time.  Psychiatric:        Behavior: Behavior normal.      Musculoskeletal Exam: C-spine, thoracic spine, lumbar spine good range of motion.  Shoulder joints, elbow joints, wrist joints, MCPs, PIPs, DIPs good range of motion no synovitis.  Hip joints, knee joints, ankle joints, MCPs, PIPs, DIPs  good range of motion no synovitis.  Rhonda Buchanan has bilateral knee crepitus.  Valgus deformity of both knee joints noted.  Mild warmth of bilateral knee joints.  No tenderness or swelling of ankle joints.  CDAI Exam: CDAI Score: Not documented Patient Global Assessment: Not documented; Provider Global Assessment: Not documented Swollen: Not documented; Tender: Not documented Joint Exam   Not documented   There is currently no information documented on the homunculus. Go to the Rheumatology activity and complete the homunculus joint exam.  Investigation: No additional findings.  Imaging: No results found.  Recent Labs: Lab Results  Component Value Date   WBC 4.9 12/21/2018   HGB 12.8 12/21/2018   PLT 236 12/21/2018   NA 140 12/21/2018   K 3.2 (L) 12/21/2018   CL 98 12/21/2018   CO2 25 12/21/2018   GLUCOSE 114 (H) 12/21/2018   BUN 17 12/21/2018   CREATININE 1.05 (H) 12/21/2018   BILITOT 0.7 12/21/2018  ALKPHOS 69 12/21/2018   AST 21 12/21/2018   ALT 24 12/21/2018   PROT 6.8 12/21/2018   ALBUMIN 4.5 12/21/2018   CALCIUM 10.2 12/21/2018   GFRAA 60 12/21/2018    Speciality Comments: No specialty comments available.  Procedures:  No procedures performed Allergies: Patient has no known allergies.   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 Bilateral valgus deformity noted.  Mild warmth of bilateral knee joints.  Rhonda Buchanan has bilateral knee crepitus Rhonda Buchanan had Synvisc injections in bilateral knee joints in March 2020.  Rhonda Buchanan noted significant improvement after the Synvisc gel injections.  Scleroderma ([facility]) - -RF, -CCP, -ANA, - PanANCA: No sclerodactyly or nailbed changes noted.  Rhonda Buchanan continues to have intermittent symptoms of Raynaud's.  No digital stations or signs of gangrene were noted.  No telangiectasias were noted.  Rhonda Buchanan has not noticed any increase in tightness or skin thickening.  Rhonda Buchanan continues  to follow-up with her dermatologist on a regular basis.  Rhonda Buchanan was advised to notify us if Rhonda Buchanan develops any new or worsening symptoms.  Morphea scleroderma - Rhonda Buchanan sees dermatologist in [place] on a regular basis.  Her dermatologist prescribes her MTX.   High risk medication use - Current regimen includes methotrexate 15 mg weekly and folic acid 2 mg daily which are managed by her dermatologist.  Most recent CBC stable and CMP within normal limits except for mildly elevated ALT on 12/11/2018 at [facility].  Recurrent acute iridocyclitis of both eyes: No recent flares or recurrences.  Rhonda Buchanan has no eye pain or photophobia.  Rhonda Buchanan chronic eye dryness.    Raynaud's syndrome without gangrene: Rhonda Buchanan has intermittent symptoms of Raynaud's.  Rhonda Buchanan has no digital ulcerations or signs of gangrene.  Rhonda Buchanan has diminished sensation in her fingertips.  Capillary refill <2 seconds.  Rhonda Buchanan was encouraged to keep her core body temperature warm.    Other medical conditions are listed as follows:  Positive PPD, treated  History of migraine  Bilateral calcaneal spurs  History of hypertension  History of hyperlipidemia  History of arthroscopic knee surgery   Orders: No orders of the defined types were placed in this encounter.  No orders of the defined types were placed in this encounter.    Follow-Up Instructions: Return in about 6 months (around 09/26/2019) for Scleroderma, Osteoarthritis, Raynaud's syndrome.   Ofilia Neas, PA-C  Note - This record has been created using Dragon software.  Chart creation errors have been sought, but may not always  have been located. Such creation errors do not reflect on  the standard of medical care.

## 2019-03-16 DIAGNOSIS — L940 Localized scleroderma [morphea]: Secondary | ICD-10-CM | POA: Insufficient documentation

## 2019-03-27 ENCOUNTER — Encounter: Payer: Self-pay | Admitting: Physician Assistant

## 2019-03-27 ENCOUNTER — Other Ambulatory Visit: Payer: Self-pay

## 2019-03-27 ENCOUNTER — Ambulatory Visit (INDEPENDENT_AMBULATORY_CARE_PROVIDER_SITE_OTHER): Payer: Medicare Other | Admitting: Physician Assistant

## 2019-03-27 VITALS — BP 113/67 | HR 72 | Resp 12 | Ht 66.5 in | Wt 175.0 lb

## 2019-03-27 DIAGNOSIS — M170 Bilateral primary osteoarthritis of knee: Secondary | ICD-10-CM | POA: Diagnosis not present

## 2019-03-27 DIAGNOSIS — H20023 Recurrent acute iridocyclitis, bilateral: Secondary | ICD-10-CM | POA: Diagnosis not present

## 2019-03-27 DIAGNOSIS — Z8679 Personal history of other diseases of the circulatory system: Secondary | ICD-10-CM

## 2019-03-27 DIAGNOSIS — Z9889 Other specified postprocedural states: Secondary | ICD-10-CM

## 2019-03-27 DIAGNOSIS — Z79899 Other long term (current) drug therapy: Secondary | ICD-10-CM

## 2019-03-27 DIAGNOSIS — M349 Systemic sclerosis, unspecified: Secondary | ICD-10-CM | POA: Diagnosis not present

## 2019-03-27 DIAGNOSIS — Z8639 Personal history of other endocrine, nutritional and metabolic disease: Secondary | ICD-10-CM

## 2019-03-27 DIAGNOSIS — Z8669 Personal history of other diseases of the nervous system and sense organs: Secondary | ICD-10-CM

## 2019-03-27 DIAGNOSIS — M7731 Calcaneal spur, right foot: Secondary | ICD-10-CM

## 2019-03-27 DIAGNOSIS — M7732 Calcaneal spur, left foot: Secondary | ICD-10-CM

## 2019-03-27 DIAGNOSIS — R7611 Nonspecific reaction to tuberculin skin test without active tuberculosis: Secondary | ICD-10-CM

## 2019-03-27 DIAGNOSIS — L940 Localized scleroderma [morphea]: Secondary | ICD-10-CM

## 2019-03-27 DIAGNOSIS — I7300 Raynaud's syndrome without gangrene: Secondary | ICD-10-CM

## 2019-04-13 DIAGNOSIS — H40023 Open angle with borderline findings, high risk, bilateral: Secondary | ICD-10-CM | POA: Diagnosis not present

## 2019-04-13 DIAGNOSIS — H40053 Ocular hypertension, bilateral: Secondary | ICD-10-CM | POA: Diagnosis not present

## 2019-04-13 DIAGNOSIS — H11823 Conjunctivochalasis, bilateral: Secondary | ICD-10-CM | POA: Diagnosis not present

## 2019-04-13 DIAGNOSIS — H0102A Squamous blepharitis right eye, upper and lower eyelids: Secondary | ICD-10-CM | POA: Diagnosis not present

## 2019-04-13 DIAGNOSIS — H0102B Squamous blepharitis left eye, upper and lower eyelids: Secondary | ICD-10-CM | POA: Diagnosis not present

## 2019-04-17 ENCOUNTER — Encounter (HOSPITAL_BASED_OUTPATIENT_CLINIC_OR_DEPARTMENT_OTHER): Payer: Medicare Other

## 2019-05-02 DIAGNOSIS — H0102A Squamous blepharitis right eye, upper and lower eyelids: Secondary | ICD-10-CM | POA: Diagnosis not present

## 2019-05-02 DIAGNOSIS — H40023 Open angle with borderline findings, high risk, bilateral: Secondary | ICD-10-CM | POA: Diagnosis not present

## 2019-05-02 DIAGNOSIS — H1131 Conjunctival hemorrhage, right eye: Secondary | ICD-10-CM | POA: Diagnosis not present

## 2019-05-02 DIAGNOSIS — H0102B Squamous blepharitis left eye, upper and lower eyelids: Secondary | ICD-10-CM | POA: Diagnosis not present

## 2019-05-02 DIAGNOSIS — H40053 Ocular hypertension, bilateral: Secondary | ICD-10-CM | POA: Diagnosis not present

## 2019-05-17 DIAGNOSIS — Z1211 Encounter for screening for malignant neoplasm of colon: Secondary | ICD-10-CM | POA: Diagnosis not present

## 2019-05-25 LAB — FECAL OCCULT BLOOD, IMMUNOCHEMICAL: IFOBT: NEGATIVE

## 2019-07-07 DIAGNOSIS — Z803 Family history of malignant neoplasm of breast: Secondary | ICD-10-CM | POA: Diagnosis not present

## 2019-07-07 DIAGNOSIS — Z1231 Encounter for screening mammogram for malignant neoplasm of breast: Secondary | ICD-10-CM | POA: Diagnosis not present

## 2019-07-07 LAB — HM MAMMOGRAPHY

## 2019-08-14 ENCOUNTER — Encounter: Payer: Self-pay | Admitting: Family Medicine

## 2019-09-03 DIAGNOSIS — L940 Localized scleroderma [morphea]: Secondary | ICD-10-CM | POA: Diagnosis not present

## 2019-09-03 DIAGNOSIS — Z79899 Other long term (current) drug therapy: Secondary | ICD-10-CM | POA: Diagnosis not present

## 2019-09-03 DIAGNOSIS — H209 Unspecified iridocyclitis: Secondary | ICD-10-CM | POA: Diagnosis not present

## 2019-09-24 ENCOUNTER — Telehealth: Payer: Self-pay | Admitting: *Deleted

## 2019-09-24 NOTE — Telephone Encounter (Signed)
Patient is having severe bil knee pain, patient wants injection, patient has rescheduled appt to 10/31/2019, patient is aware office is only seeing virtual visits currently, patient does not have an orthopedic doctor. Please advise.

## 2019-09-24 NOTE — Telephone Encounter (Signed)
Patient had good relief from Visco supplement injections in the past.  Please apply for the Visco supplement injections.  Please notify patient that we will inject her as soon as the injections are approved.  In the meantime if she wants to have a cortisone injection we can do that.  Although cortisone injections are not very effective and do not last for a long time.

## 2019-09-25 ENCOUNTER — Ambulatory Visit: Payer: Medicare Other | Admitting: Rheumatology

## 2019-09-25 DIAGNOSIS — H0102B Squamous blepharitis left eye, upper and lower eyelids: Secondary | ICD-10-CM | POA: Diagnosis not present

## 2019-09-25 DIAGNOSIS — H40023 Open angle with borderline findings, high risk, bilateral: Secondary | ICD-10-CM | POA: Diagnosis not present

## 2019-09-25 DIAGNOSIS — H0102A Squamous blepharitis right eye, upper and lower eyelids: Secondary | ICD-10-CM | POA: Diagnosis not present

## 2019-09-25 DIAGNOSIS — H11823 Conjunctivochalasis, bilateral: Secondary | ICD-10-CM | POA: Diagnosis not present

## 2019-09-25 DIAGNOSIS — H40053 Ocular hypertension, bilateral: Secondary | ICD-10-CM | POA: Diagnosis not present

## 2019-09-26 ENCOUNTER — Encounter: Payer: Self-pay | Admitting: Rheumatology

## 2019-09-26 ENCOUNTER — Ambulatory Visit: Payer: Self-pay

## 2019-09-26 ENCOUNTER — Ambulatory Visit (INDEPENDENT_AMBULATORY_CARE_PROVIDER_SITE_OTHER): Payer: Medicare Other | Admitting: Rheumatology

## 2019-09-26 ENCOUNTER — Ambulatory Visit (INDEPENDENT_AMBULATORY_CARE_PROVIDER_SITE_OTHER): Payer: Medicare Other

## 2019-09-26 ENCOUNTER — Other Ambulatory Visit: Payer: Self-pay

## 2019-09-26 VITALS — BP 121/72 | HR 69 | Resp 13 | Ht 66.0 in | Wt 173.8 lb

## 2019-09-26 DIAGNOSIS — M170 Bilateral primary osteoarthritis of knee: Secondary | ICD-10-CM

## 2019-09-26 DIAGNOSIS — M1711 Unilateral primary osteoarthritis, right knee: Secondary | ICD-10-CM

## 2019-09-26 DIAGNOSIS — R7611 Nonspecific reaction to tuberculin skin test without active tuberculosis: Secondary | ICD-10-CM

## 2019-09-26 DIAGNOSIS — I7300 Raynaud's syndrome without gangrene: Secondary | ICD-10-CM

## 2019-09-26 DIAGNOSIS — Z79899 Other long term (current) drug therapy: Secondary | ICD-10-CM

## 2019-09-26 DIAGNOSIS — M1712 Unilateral primary osteoarthritis, left knee: Secondary | ICD-10-CM | POA: Diagnosis not present

## 2019-09-26 DIAGNOSIS — H20023 Recurrent acute iridocyclitis, bilateral: Secondary | ICD-10-CM

## 2019-09-26 DIAGNOSIS — Z8639 Personal history of other endocrine, nutritional and metabolic disease: Secondary | ICD-10-CM

## 2019-09-26 DIAGNOSIS — M7731 Calcaneal spur, right foot: Secondary | ICD-10-CM

## 2019-09-26 DIAGNOSIS — M7732 Calcaneal spur, left foot: Secondary | ICD-10-CM

## 2019-09-26 DIAGNOSIS — Z8679 Personal history of other diseases of the circulatory system: Secondary | ICD-10-CM

## 2019-09-26 DIAGNOSIS — L940 Localized scleroderma [morphea]: Secondary | ICD-10-CM | POA: Diagnosis not present

## 2019-09-26 DIAGNOSIS — Z8669 Personal history of other diseases of the nervous system and sense organs: Secondary | ICD-10-CM

## 2019-09-26 DIAGNOSIS — M349 Systemic sclerosis, unspecified: Secondary | ICD-10-CM | POA: Diagnosis not present

## 2019-09-26 MED ORDER — LIDOCAINE HCL 1 % IJ SOLN
1.5000 mL | INTRAMUSCULAR | Status: AC | PRN
  Administered 2019-09-26: 1.5 mL

## 2019-09-26 MED ORDER — TRIAMCINOLONE ACETONIDE 40 MG/ML IJ SUSP
40.0000 mg | INTRAMUSCULAR | Status: AC | PRN
  Administered 2019-09-26: 40 mg via INTRA_ARTICULAR

## 2019-09-26 NOTE — Telephone Encounter (Signed)
Please apply for Bilateral Visco for 2021. Thank you.

## 2019-09-26 NOTE — Progress Notes (Signed)
Office Visit Note  Patient: Rhonda Buchanan             Date of Birth: [birth year]           MRN: [id number]             PCP: Denita Lung, MD Referring: Denita Lung, MD Visit Date: 09/26/2019 Occupation: @GUAROCC @  Subjective:  Pain in both knee joints   History of Present Illness: Rhonda Buchanan is a 73 y.o. female with history of scleroderma and osteoarthritis. She is taking methotrexate 6 tablets by mouth once weekly and folic acid 2 mg po daily.  She has not missed any doses of MTX recently.   She presents today with pain in both knee joints, especially the right knee joint.  She has been having increased discomfort and stiffness for the past 1 month. She has been experiencing nocturnal pain. She has noticed joint swelling intermittently.  She has been taking tylenol arthritis for pain relief.  She has also tried using ice and heat.  She is having right ankle joint pain, which she attributes to gait changes.  She experiences sharp pains in the right ankle joint but no joint swelling. She denies any other joint pain or joint swelling.  She denies any recent rashes.  She denies any increased skin tightness.  She denies any dysphagia.  She has intermittent symptoms of Raynaud's. Denies any other new or worsening symptoms.    Activities of Daily Living:  Patient reports morning stiffness for 15-30 minutes.   Patient Reports nocturnal pain.  Difficulty dressing/grooming: Denies Difficulty climbing stairs: Reports Difficulty getting out of chair: Reports Difficulty using hands for taps, buttons, cutlery, and/or writing: Denies  Review of Systems  Constitutional: Positive for fatigue.  HENT: Negative for mouth sores, mouth dryness and nose dryness.   Eyes: Positive for dryness. Negative for pain and visual disturbance.  Respiratory: Negative for cough, hemoptysis, shortness of breath and difficulty breathing.   Cardiovascular: Negative for chest pain, palpitations,  hypertension and swelling in legs/feet.  Gastrointestinal: Negative for blood in stool, constipation and diarrhea.  Endocrine: Negative for increased urination.  Genitourinary: Negative for painful urination.  Musculoskeletal: Positive for arthralgias, joint pain, joint swelling and morning stiffness. Negative for myalgias, muscle weakness, muscle tenderness and myalgias.  Skin: Negative for color change, pallor, rash, hair loss, nodules/bumps, skin tightness, ulcers and sensitivity to sunlight.  Allergic/Immunologic: Negative for susceptible to infections.  Neurological: Negative for dizziness, numbness, headaches, memory loss and weakness.  Hematological: Negative for swollen glands.  Psychiatric/Behavioral: Negative for depressed mood, confusion and sleep disturbance. The patient is not nervous/anxious.     PMFS History:  Patient Active Problem List   Diagnosis Date Noted  . 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 Atherosclerosis of coronary artery of native heart without angina pectoris 10/05/2016  . Aortic stenosis 05/21/2016  . Abnormal PFTs 05/21/2016  . Asthma due to environmental allergies 06/24/2015  . 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 Scleroderma ([place]) 07/12/2013  . Hypertension 03/04/2011  . Hyperlipidemia 03/04/2011  . Allergic rhinitis 03/04/2011  . Arthritis 03/04/2011  . Migraine variant 04/18/1995    Past Medical History:  Diagnosis Date  . Allergy   . Arthritis   . Hypercholesterolemia   . Hypertension   . PUD (peptic ulcer disease)   . Scleroderma ([place])     Family History  Problem Relation Age of Onset  . Ulcers Brother   .  Heart disease Brother 28       MI  . Cancer Father   . Ulcers Sister   . Cancer Maternal Grandmother   . Heart disease Maternal Grandfather    Past Surgical History:  Procedure Laterality Date  . arthroscopy    . CYST REMOVAL HAND    . EYE SURGERY Left 10/25/2017   laser  surgery   . KNEE ARTHROPLASTY     Social History   Social History Narrative  . Not on file   Immunization History  Administered Date(s) Administered  . H1N1 08/22/2009  . Influenza, High Dose Seasonal PF 06/24/2015, 10/05/2016  . Influenza,inj,Quad PF,6+ Mos 07/12/2013, 01/15/2015  . Influenza-Unspecified 12/24/2017, 08/04/2018  . Pneumococcal Conjugate-13 05/20/2014  . Pneumococcal Polysaccharide-23 01/31/2007  . Tdap 01/31/2007  . Zoster 07/02/2008     Objective: Vital Signs: BP 121/72 (BP Location: Right Arm, Patient Position: Sitting, Cuff Size: Normal)   Pulse 69   Resp 13   Ht 5\' 6"  (1.676 m)   Wt 173 lb 12.8 oz (78.8 kg)   LMP 02/16/2003   BMI 28.05 kg/m    Physical Exam Vitals signs and nursing note reviewed.  Constitutional:      Appearance: She is well-developed.  HENT:     Head: Normocephalic and atraumatic.  Eyes:     Conjunctiva/sclera: Conjunctivae normal.  Neck:     Musculoskeletal: Normal range of motion.  Cardiovascular:     Rate and Rhythm: Normal rate and regular rhythm.     Heart sounds: Normal heart sounds.  Pulmonary:     Effort: Pulmonary effort is normal.     Breath sounds: Normal breath sounds.  Abdominal:     General: Bowel sounds are normal.     Palpations: Abdomen is soft.  Lymphadenopathy:     Cervical: No cervical adenopathy.  Skin:    General: Skin is warm and dry.     Capillary Refill: Capillary refill takes less than 2 seconds.  Neurological:     Mental Status: She is alert and oriented to person, place, and time.  Psychiatric:        Behavior: Behavior normal.      Musculoskeletal Exam: C-spine, thoracic spine, and lumbar spine good ROM.  No midline spinal tenderness.  No SI joint tenderness. Shoulder joints, elbow joints, wrist joints, MCPs, PIPs, and DIPs good ROM with no synovitis.  Right knee limited extension with discomfort.  No warmth or effusion of knee joints noted.  Ankle joints good ROM with no warmth or  effusion.    CDAI Exam: CDAI Score: - Patient Global: -; Provider Global: - Swollen: -; Tender: - Joint Exam   No joint exam has been documented for this visit   There is currently no information documented on the homunculus. Go to the Rheumatology activity and complete the homunculus joint exam.  Investigation: No additional findings.  Imaging: Xr Knee 3 View Left  Result Date: 09/26/2019 Severe lateral compartment narrowing with lateral osteophytes was noted.  No chondrocalcinosis was noted.  Severe patellofemoral narrowing was noted. Impression: These findings are consistent with severe osteoarthritis and severe chondromalacia patella.  Xr Knee 3 View Right  Result Date: 09/26/2019 Severe lateral compartment narrowing with lateral osteophytes was noted.  No chondrocalcinosis was noted.  Severe patellofemoral narrowing was noted. Impression: These findings are consistent with severe osteoarthritis and severe chondromalacia patella.   Recent Labs: Lab Results  Component Value Date   WBC 4.9 12/21/2018   HGB 12.8 12/21/2018  PLT 236 12/21/2018   NA 140 12/21/2018   K 3.2 (L) 12/21/2018   CL 98 12/21/2018   CO2 25 12/21/2018   GLUCOSE 114 (H) 12/21/2018   BUN 17 12/21/2018   CREATININE 1.05 (H) 12/21/2018   BILITOT 0.7 12/21/2018   ALKPHOS 69 12/21/2018   AST 21 12/21/2018   ALT 24 12/21/2018   PROT 6.8 12/21/2018   ALBUMIN 4.5 12/21/2018   CALCIUM 10.2 12/21/2018   GFRAA 60 12/21/2018    Speciality Comments: No specialty comments available.  Procedures:  Large Joint Inj: bilateral knee on 09/26/2019 3:39 PM Indications: pain Details: 27 G 1.5 in needle, medial approach  Arthrogram: No  Medications (Right): 1.5 mL lidocaine 1 %; 40 mg triamcinolone acetonide 40 MG/ML Aspirate (Right): 0 mL Medications (Left): 1.5 mL lidocaine 1 %; 40 mg triamcinolone acetonide 40 MG/ML Aspirate (Left): 0 mL Outcome: tolerated well, no immediate complications Procedure,  treatment alternatives, risks and benefits explained, specific risks discussed. Consent was given by the patient. Immediately prior to procedure a time out was called to verify the correct patient, procedure, equipment, support staff and site/side marked as required. Patient was prepped and draped in the usual sterile fashion.     Allergies: Ibuprofen   Assessment / Plan:     Visit Diagnoses: Scleroderma ([place]): -RF, -CCP, -ANA, - PanANCA: No sclerodactyly or nailbed changes noted.  She has been experiencing intermittent symptoms of Raynaud's but no digital ulcerations or signs or gangrene were noted on exam.  She is taking MTX 6 tablets po once weekly and folic acid 2 mg po daily prescribed by her dermatologist.  She was advised to notify us if she develops any new or worsening symptoms.  She will follow up in 3 months.   Morphea scleroderma: She is followed closely by her dermatologist in [facility]  She is taking MTX 6 tablets by mouth once weekly and folic acid 2 mg po daily prescribed by her dermatologist.   High risk medication use: She is taking methotrexate 6 tablets by mouth once weekly and folic acid 2 mg po daily.  CBC and CMP were drawn on 12/21/18.  She is overdue to update lab work.  She will require lab work every 3 months.   Recurrent acute iridocyclitis of both eyes: She has not had any recent flares.  She has chronic eye dryness but no inflammation recently.   Raynaud's syndrome without gangrene: She has intermittent symptoms of Raynaud's.  No digital ulcerations or signs of gangrene were noted.  We discussed the importance of keeping her core body temperature warm and wearing gloves/socks on a regular basis.    Primary osteoarthritis of both knees -She presents today with pain in bilateral knee joints, which has been progressively worsening over the past 1 month.  She has noticed intermittent inflammation and stiffness.  She has also been experiencing nocturnal symptoms. X-rays of  both knee joints were obtained today.  Right knee joint x-rays revealed severe OA and severe chondromalacia patella. Left: severe lateral compartment narrowing. She requested bilateral knee joint cortisone injections.  She tolerated the procedure well.  Aftercare was discussed.  The procedure not was completed above.  Plan: XR KNEE 3 VIEW RIGHT, XR KNEE 3 VIEW LEFT  This patient is diagnosed with osteoarthritis of the knee(s).    Radiographs show evidence of joint space narrowing, osteophytes, subchondral sclerosis and/or subchondral cysts.  This patient has knee pain which interferes with functional and activities of daily living.  This patient has experienced inadequate response, adverse effects and/or intolerance with conservative treatments such as acetaminophen, NSAIDS, topical creams, physical therapy or regular exercise, knee bracing and/or weight loss.   This patient has experienced inadequate response or has a contraindication to intra articular steroid injections for at least 3 months.   This patient is not scheduled to have a total knee replacement within 6 months of starting treatment with viscosupplementation.  Other medical conditions are listed as follows:  Positive PPD, treated  History of migraine  Bilateral calcaneal spurs  History of hypertension  History of hyperlipidemia  Orders: Orders Placed This Encounter  Procedures  . Large Joint Inj  . XR KNEE 3 VIEW RIGHT  . XR KNEE 3 VIEW LEFT   No orders of the defined types were placed in this encounter.   Face-to-face time spent with patient was 30  minutes. Greater than 50% of time was spent in counseling and coordination of care.  Follow-Up Instructions: No follow-ups on file.   Ofilia Neas, PA-C   I examined and evaluated the patient with Hazel Sams PA.  Patient was seen on urgent basis due to severe pain and discomfort in her bilateral knee joints.  X-ray obtained today showed severe osteoarthritis and  chondromalacia patella involving bilateral knee joints.  After treatment treatment options and side effects were discussed she wanted to proceed with cortisone injection.  Bilateral knee joints were injected with cortisone by me as described above.  She tolerated the procedure well.  I had detailed discussion with her regarding bilateral total knee replacement.  She states she is not currently ready for it due to the pandemic.  She wants to proceed with Visco supplement injections.  She states she had good results in the past.  We will apply for Visco supplement injections.  The plan of care was discussed as noted above.  Bo Merino, MD  Note - This record has been created using Editor, commissioning.  Chart creation errors have been sought, but may not always  have been located. Such creation errors do not reflect on  the standard of medical care.

## 2019-10-02 ENCOUNTER — Other Ambulatory Visit: Payer: Self-pay

## 2019-10-02 DIAGNOSIS — Z9189 Other specified personal risk factors, not elsewhere classified: Secondary | ICD-10-CM

## 2019-10-26 NOTE — Telephone Encounter (Signed)
Submitted VOB 10/25/19.

## 2019-10-29 ENCOUNTER — Ambulatory Visit: Payer: Medicare Other | Admitting: Physician Assistant

## 2019-11-02 NOTE — Telephone Encounter (Signed)
Please call patient to schedule for Visco series.  Synvisc series Bilateral knees Buy and Bill No PA required Insurance will cover 80% of total allowable. Patient responsible for 20%, plus $35.00 co-pay each visit.

## 2019-11-20 NOTE — Telephone Encounter (Signed)
LMOM for patient to call and schedule Synvisc injections for bilateral knees.

## 2019-12-13 ENCOUNTER — Ambulatory Visit: Payer: Medicare Other | Attending: Internal Medicine

## 2019-12-13 DIAGNOSIS — Z23 Encounter for immunization: Secondary | ICD-10-CM

## 2019-12-13 NOTE — Progress Notes (Signed)
   Covid-19 Vaccination Clinic  Name:  Rhonda Buchanan    MRN: [id number] DOB: 1946-10-10  12/13/2019  Rhonda Buchanan was observed post Covid-19 immunization for 30 minutes based on pre-vaccination screening without incidence. She was provided with Vaccine Information Sheet and instruction to access the V-Safe system.   Rhonda Buchanan was instructed to call 911 with any severe reactions post vaccine: Marland Kitchen Difficulty breathing  . Swelling of your face and throat  . A fast heartbeat  . A bad rash all over your body  . Dizziness and weakness    Immunizations Administered    Name Date Dose VIS Date Route   Pfizer COVID-19 Vaccine 12/13/2019 12:55 PM 0.3 mL 09/28/2019 Intramuscular   Manufacturer: [place]   Lot: [id number]   [place]: [id number]

## 2019-12-27 ENCOUNTER — Ambulatory Visit (HOSPITAL_BASED_OUTPATIENT_CLINIC_OR_DEPARTMENT_OTHER): Payer: Medicare Other | Admitting: Internal Medicine

## 2020-01-08 ENCOUNTER — Ambulatory Visit: Payer: Medicare Other | Attending: Internal Medicine

## 2020-01-08 DIAGNOSIS — Z23 Encounter for immunization: Secondary | ICD-10-CM

## 2020-01-08 NOTE — Progress Notes (Signed)
   Covid-19 Vaccination Clinic  Name:  [place] M Markel    MRN: [id number] DOB: 1946-08-29  01/08/2020  Ms. Stannard was observed post Covid-19 immunization for 15 minutes without incident. She was provided with Vaccine Information Sheet and instruction to access the V-Safe system.   Ms. Bruhl was instructed to call 911 with any severe reactions post vaccine: Marland Kitchen Difficulty breathing  . Swelling of face and throat  . A fast heartbeat  . A bad rash all over body  . Dizziness and weakness   Immunizations Administered    Name Date Dose VIS Date Route   Pfizer COVID-19 Vaccine 01/08/2020  1:42 PM 0.3 mL 09/28/2019 Intramuscular   Manufacturer: [place]   Lot: [id number]   [place]: [id number]

## 2020-01-27 ENCOUNTER — Other Ambulatory Visit: Payer: Self-pay | Admitting: Family Medicine

## 2020-01-27 DIAGNOSIS — E785 Hyperlipidemia, unspecified: Secondary | ICD-10-CM

## 2020-01-27 DIAGNOSIS — I10 Essential (primary) hypertension: Secondary | ICD-10-CM

## 2020-02-11 ENCOUNTER — Other Ambulatory Visit: Payer: Self-pay

## 2020-02-11 ENCOUNTER — Ambulatory Visit: Payer: Medicare Other | Admitting: Physician Assistant

## 2020-02-11 DIAGNOSIS — M1712 Unilateral primary osteoarthritis, left knee: Secondary | ICD-10-CM | POA: Diagnosis not present

## 2020-02-11 DIAGNOSIS — M170 Bilateral primary osteoarthritis of knee: Secondary | ICD-10-CM

## 2020-02-11 DIAGNOSIS — M1711 Unilateral primary osteoarthritis, right knee: Secondary | ICD-10-CM | POA: Diagnosis not present

## 2020-02-11 MED ORDER — LIDOCAINE HCL 1 % IJ SOLN
1.5000 mL | INTRAMUSCULAR | Status: AC | PRN
  Administered 2020-02-11: 1.5 mL

## 2020-02-11 MED ORDER — HYLAN G-F 20 16 MG/2ML IX SOSY
16.0000 mg | PREFILLED_SYRINGE | INTRA_ARTICULAR | Status: AC | PRN
  Administered 2020-02-11: 16 mg via INTRA_ARTICULAR

## 2020-02-11 NOTE — Progress Notes (Signed)
   Procedure Note  Patient: Rhonda Buchanan             Date of Birth: [birth year]           MRN: [id number]             Visit Date: 02/11/2020  Procedures: Visit Diagnoses:  1. Primary osteoarthritis of both knees    Synvisc #1 Bilateral knee joint injections  Large Joint Inj: bilateral knee on 02/11/2020 8:20 AM Indications: pain Details: 25 G 1.5 in needle, medial approach  Arthrogram: No  Medications (Right): 1.5 mL lidocaine 1 %; 16 mg Hylan 16 MG/2ML Aspirate (Right): 0 mL Medications (Left): 1.5 mL lidocaine 1 %; 16 mg Hylan 16 MG/2ML Aspirate (Left): 0 mL Outcome: tolerated well, no immediate complications Procedure, treatment alternatives, risks and benefits explained, specific risks discussed. Consent was given by the patient. Immediately prior to procedure a time out was called to verify the correct patient, procedure, equipment, support staff and site/side marked as required. Patient was prepped and draped in the usual sterile fashion.      Patient tolerated the procedure well. Aftercare was discussed.  Hazel Sams, PA-C

## 2020-02-18 ENCOUNTER — Ambulatory Visit: Payer: Medicare Other | Admitting: Physician Assistant

## 2020-02-18 ENCOUNTER — Other Ambulatory Visit: Payer: Self-pay

## 2020-02-18 DIAGNOSIS — M170 Bilateral primary osteoarthritis of knee: Secondary | ICD-10-CM

## 2020-02-18 DIAGNOSIS — M1712 Unilateral primary osteoarthritis, left knee: Secondary | ICD-10-CM

## 2020-02-18 DIAGNOSIS — M1711 Unilateral primary osteoarthritis, right knee: Secondary | ICD-10-CM | POA: Diagnosis not present

## 2020-02-18 MED ORDER — HYLAN G-F 20 16 MG/2ML IX SOSY
16.0000 mg | PREFILLED_SYRINGE | INTRA_ARTICULAR | Status: AC | PRN
  Administered 2020-02-18: 16 mg via INTRA_ARTICULAR

## 2020-02-18 MED ORDER — LIDOCAINE HCL 1 % IJ SOLN
1.5000 mL | INTRAMUSCULAR | Status: AC | PRN
  Administered 2020-02-18: 1.5 mL

## 2020-02-18 NOTE — Progress Notes (Signed)
   Procedure Note  Patient: Rhonda Buchanan             Date of Birth: 12-Jan-1946           MRN: [id number]             Visit Date: 02/18/2020  Procedures: Visit Diagnoses:  1. Primary osteoarthritis of both knees    Synvisc #2 Bilateral knee joint injections  Large Joint Inj: bilateral knee on 02/18/2020 8:09 AM Indications: pain Details: 25 G 1.5 in needle, medial approach  Arthrogram: No  Medications (Right): 1.5 mL lidocaine 1 %; 16 mg Hylan 16 MG/2ML Aspirate (Right): 0 mL Medications (Left): 1.5 mL lidocaine 1 %; 16 mg Hylan 16 MG/2ML Aspirate (Left): 0 mL Outcome: tolerated well, no immediate complications Procedure, treatment alternatives, risks and benefits explained, specific risks discussed. Consent was given by the patient. Immediately prior to procedure a time out was called to verify the correct patient, procedure, equipment, support staff and site/side marked as required. Patient was prepped and draped in the usual sterile fashion.     Patient tolerated the procedure well. Aftercare was discussed.  Hazel Sams, PA-C

## 2020-02-25 ENCOUNTER — Other Ambulatory Visit: Payer: Self-pay

## 2020-02-25 ENCOUNTER — Ambulatory Visit: Payer: Medicare Other | Admitting: Physician Assistant

## 2020-02-25 DIAGNOSIS — M1712 Unilateral primary osteoarthritis, left knee: Secondary | ICD-10-CM

## 2020-02-25 DIAGNOSIS — M170 Bilateral primary osteoarthritis of knee: Secondary | ICD-10-CM

## 2020-02-25 DIAGNOSIS — M1711 Unilateral primary osteoarthritis, right knee: Secondary | ICD-10-CM

## 2020-02-25 MED ORDER — HYLAN G-F 20 16 MG/2ML IX SOSY
16.0000 mg | PREFILLED_SYRINGE | INTRA_ARTICULAR | Status: AC | PRN
  Administered 2020-02-25: 16 mg via INTRA_ARTICULAR

## 2020-02-25 MED ORDER — LIDOCAINE HCL 1 % IJ SOLN
1.5000 mL | INTRAMUSCULAR | Status: AC | PRN
  Administered 2020-02-25: 1.5 mL

## 2020-02-25 NOTE — Progress Notes (Signed)
   Procedure Note  Patient: Rhonda Buchanan             Date of Birth: [birth year]           MRN: [id number]             Visit Date: 02/25/2020  Procedures: Visit Diagnoses:  1. Primary osteoarthritis of both knees    Synvisc #3 Bilateral knee joint injections  Large Joint Inj: bilateral knee on 02/25/2020 2:07 PM Indications: pain Details: 25 G 1.5 in needle, medial approach  Arthrogram: No  Medications (Right): 1.5 mL lidocaine 1 %; 16 mg Hylan 16 MG/2ML Aspirate (Right): 0 mL Medications (Left): 1.5 mL lidocaine 1 %; 16 mg Hylan 16 MG/2ML Aspirate (Left): 0 mL Outcome: tolerated well, no immediate complications Procedure, treatment alternatives, risks and benefits explained, specific risks discussed. Consent was given by the patient. Immediately prior to procedure a time out was called to verify the correct patient, procedure, equipment, support staff and site/side marked as required. Patient was prepped and draped in the usual sterile fashion.     Patient tolerated the procedure well. Aftercare was discussed.  Hazel Sams, PA-C

## 2020-03-25 DIAGNOSIS — H40053 Ocular hypertension, bilateral: Secondary | ICD-10-CM | POA: Diagnosis not present

## 2020-03-25 DIAGNOSIS — H47011 Ischemic optic neuropathy, right eye: Secondary | ICD-10-CM | POA: Diagnosis not present

## 2020-03-25 DIAGNOSIS — H40043 Steroid responder, bilateral: Secondary | ICD-10-CM | POA: Diagnosis not present

## 2020-03-25 DIAGNOSIS — H0102A Squamous blepharitis right eye, upper and lower eyelids: Secondary | ICD-10-CM | POA: Diagnosis not present

## 2020-03-25 DIAGNOSIS — H40023 Open angle with borderline findings, high risk, bilateral: Secondary | ICD-10-CM | POA: Diagnosis not present

## 2020-03-26 NOTE — Progress Notes (Signed)
Office Visit Note  Patient: Rhonda Buchanan             Date of Birth: 09-18-1946           MRN: [id number]             PCP: Denita Lung, MD Referring: Denita Lung, MD Visit Date: 04/09/2020 Occupation: @GUAROCC @  Subjective:  Morphea, knee pain.   History of Present Illness: Rhonda JANISHA BUESO is a 74 y.o. female with history of scleroderma and osteoarthritis. She is taking methotrexate 6 tablets by mouth once weekly and folic acid 3 mg po daily except for the day she takes methotrexate prescribed by Dr. Otho Ket.She decreased to 5 tablets in February/March when she received the Covid-19 vaccine.  Then last week she started itching and developed rash and increased back to the 6 tablets every 7 days.  She also started using fluocinonide ointment which was effective. Denies missing any doses, side effects, or difficulty obtaining from the pharmacy. Reports joint stiffness of bilateral knees in the morning. Denies any joint swelling or redness.  She does have some discomfort in her feet and numbness in left hand.  Activities of Daily Living:  Patient reports morning stiffness for 10-15  minutes.   Patient Reports nocturnal pain.  Difficulty dressing/grooming: Denies Difficulty climbing stairs: Denies Difficulty getting out of chair: Reports Difficulty using hands for taps, buttons, cutlery, and/or writing: Denies  Review of Systems  Constitutional: Negative for fatigue, night sweats, weight gain and weight loss.  HENT: Negative for mouth sores, trouble swallowing, trouble swallowing, mouth dryness and nose dryness.   Eyes: Negative for pain, redness, itching, visual disturbance and dryness.  Respiratory: Negative for cough, shortness of breath and difficulty breathing.   Cardiovascular: Negative for chest pain, palpitations, hypertension, irregular heartbeat and swelling in legs/feet.  Gastrointestinal: Negative for blood in stool, constipation and diarrhea.  Endocrine: Negative  for increased urination.  Genitourinary: Negative for difficulty urinating, painful urination and vaginal dryness.  Musculoskeletal: Positive for arthralgias, joint pain and morning stiffness. Negative for joint swelling, myalgias, muscle weakness, muscle tenderness and myalgias.  Skin: Positive for color change and rash. Negative for hair loss, skin tightness, ulcers and sensitivity to sunlight.  Allergic/Immunologic: Negative for susceptible to infections.  Neurological: Positive for numbness. Negative for dizziness, headaches, memory loss, night sweats and weakness.  Hematological: Negative for bruising/bleeding tendency and swollen glands.  Psychiatric/Behavioral: Negative for depressed mood, confusion and sleep disturbance. The patient is not nervous/anxious.     PMFS History:  Patient Active Problem List   Diagnosis Date Noted  . High risk medication use 04/09/2020  . 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 Atherosclerosis of coronary artery of native heart without angina pectoris 10/05/2016  . Aortic stenosis 05/21/2016  . Abnormal PFTs 05/21/2016  . Asthma due to environmental allergies 06/24/2015  . 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 Scleroderma ([place]) 07/12/2013  . Hypertension 03/04/2011  . Hyperlipidemia 03/04/2011  . Allergic rhinitis 03/04/2011  . Arthritis 03/04/2011  . Migraine variant 04/18/1995    Past Medical History:  Diagnosis Date  . Allergy   . Arthritis   . Hypercholesterolemia   . Hypertension   . PUD (peptic ulcer disease)   . Scleroderma ([place])     Family History  Problem Relation Age of Onset  . Ulcers Brother   . Heart disease Brother 75       MI  . Cancer Father   .  Ulcers Sister   . Cancer Maternal Grandmother   . Heart disease Maternal Grandfather    Past Surgical History:  Procedure Laterality Date  . arthroscopy    . CYST REMOVAL HAND    . EYE SURGERY Left 10/25/2017   laser surgery    . KNEE ARTHROPLASTY     Social History   Social History Narrative  . Not on file   Immunization History  Administered Date(s) Administered  . H1N1 08/22/2009  . Influenza, High Dose Seasonal PF 06/24/2015, 10/05/2016, 08/04/2018  . Influenza,inj,Quad PF,6+ Mos 07/12/2013, 01/15/2015  . Influenza-Unspecified 12/24/2017, 08/04/2018  . PFIZER SARS-COV-2 Vaccination 12/13/2019, 01/08/2020  . Pneumococcal Conjugate-13 05/20/2014  . Pneumococcal Polysaccharide-23 01/31/2007  . Tdap 01/31/2007  . Zoster 07/02/2008     Objective: Vital Signs: BP 104/62 (BP Location: Left Arm, Patient Position: Sitting, Cuff Size: Normal)   Pulse 65   Resp 15   Ht 5\' 6"  (1.676 m)   Wt 172 lb 12.8 oz (78.4 kg)   LMP 02/16/2003   BMI 27.89 kg/m    Physical Exam Vitals and nursing note reviewed.  Constitutional:      Appearance: She is well-developed.  HENT:     Head: Normocephalic and atraumatic.  Eyes:     Conjunctiva/sclera: Conjunctivae normal.  Cardiovascular:     Rate and Rhythm: Normal rate and regular rhythm.     Heart sounds: Normal heart sounds.  Pulmonary:     Effort: Pulmonary effort is normal.     Breath sounds: Normal breath sounds.  Abdominal:     General: Bowel sounds are normal.     Palpations: Abdomen is soft.  Musculoskeletal:     Cervical back: Normal range of motion.  Lymphadenopathy:     Cervical: No cervical adenopathy.  Skin:    General: Skin is warm and dry.     Capillary Refill: Capillary refill takes less than 2 seconds.     Comments: No sclerodactyly was noted.  No telangiectasias were noted.  She has rash from morphea on her bilateral arm.  Neurological:     Mental Status: She is alert and oriented to person, place, and time.  Psychiatric:        Behavior: Behavior normal.      Musculoskeletal Exam: C-spine, thoracic and lumbar spine with good range of motion.  Shoulder joints, elbow joints, wrist joints, MCPs PIPs and DIPs with good range of motion  with no synovitis.  She has incomplete extension of her knee joints with some crepitus without any warmth swelling or effusion.  She had no tenderness over MTPs.  CDAI Exam: CDAI Score: -- Patient Global: --; Provider Global: -- Swollen: --; Tender: -- Joint Exam 04/09/2020   No joint exam has been documented for this visit   There is currently no information documented on the homunculus. Go to the Rheumatology activity and complete the homunculus joint exam.  Investigation: No additional findings.  Imaging: No results found.  Recent Labs: Lab Results  Component Value Date   WBC 4.9 12/21/2018   HGB 12.8 12/21/2018   PLT 236 12/21/2018   NA 140 12/21/2018   K 3.2 (L) 12/21/2018   CL 98 12/21/2018   CO2 25 12/21/2018   GLUCOSE 114 (H) 12/21/2018   BUN 17 12/21/2018   CREATININE 1.05 (H) 12/21/2018   BILITOT 0.7 12/21/2018   ALKPHOS 69 12/21/2018   AST 21 12/21/2018   ALT 24 12/21/2018   PROT 6.8 12/21/2018   ALBUMIN 4.5 12/21/2018  CALCIUM 10.2 12/21/2018   GFRAA 60 12/21/2018    Speciality Comments: No specialty comments available.  Procedures:  No procedures performed Allergies: Ibuprofen   Assessment / Plan:     Visit Diagnoses: Scleroderma ([place]) - -RF, -CCP, -ANA, - PanANCA patient gives history of scleroderma.  She has no sclerodactyly, telangiectasias.  There is history of Raynaud's.  She denies any dysphagia or shortness of breath.  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  She is taking MTX 6 tablets by mouth once weekly and folic acid 2 mg po daily prescribed by her dermatology.  I do not see any labs in a long time.  I will check labs today.  She will get usual labs with her dermatologist.  High risk medication use - She is taking methotrexate 6 tablets by mouth once weekly and folic acid 2 mg po daily. - Plan: CBC with Differential/Platelet, COMPLETE METABOLIC PANEL WITH GFR.  Use of Shingrix, Tdap and pneumococcal 23  vaccine was recommended.  She will discuss that further with her PCP.  Recurrent acute iridocyclitis of both eyes-she gives history of dry eyes.  Raynaud's syndrome without gangrene-symptomatic mostly during wintertime.  She is doing well and does not need any treatment.  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  She has some discomfort.  She is not ready for total knee replacement.  Other medical problems are listed as follows:  Positive PPD, treated  Bilateral calcaneal spurs  History of migraine  History of hyperlipidemia  History of hypertension  Orders: No orders of the defined types were placed in this encounter.  No orders of the defined types were placed in this encounter.     Follow-Up Instructions: No follow-ups on file.   Bo Merino, MD  Note - This record has been created using Editor, commissioning.  Chart creation errors have been sought, but may not always  have been located. Such creation errors do not reflect on  the standard of medical care.

## 2020-04-09 ENCOUNTER — Other Ambulatory Visit: Payer: Self-pay

## 2020-04-09 ENCOUNTER — Encounter: Payer: Self-pay | Admitting: Rheumatology

## 2020-04-09 ENCOUNTER — Ambulatory Visit: Payer: Medicare Other | Admitting: Rheumatology

## 2020-04-09 VITALS — BP 104/62 | HR 65 | Resp 15 | Ht 66.0 in | Wt 172.8 lb

## 2020-04-09 DIAGNOSIS — L940 Localized scleroderma [morphea]: Secondary | ICD-10-CM | POA: Diagnosis not present

## 2020-04-09 DIAGNOSIS — Z79899 Other long term (current) drug therapy: Secondary | ICD-10-CM

## 2020-04-09 DIAGNOSIS — M170 Bilateral primary osteoarthritis of knee: Secondary | ICD-10-CM

## 2020-04-09 DIAGNOSIS — H20023 Recurrent acute iridocyclitis, bilateral: Secondary | ICD-10-CM

## 2020-04-09 DIAGNOSIS — Z8639 Personal history of other endocrine, nutritional and metabolic disease: Secondary | ICD-10-CM

## 2020-04-09 DIAGNOSIS — M7731 Calcaneal spur, right foot: Secondary | ICD-10-CM

## 2020-04-09 DIAGNOSIS — M7732 Calcaneal spur, left foot: Secondary | ICD-10-CM

## 2020-04-09 DIAGNOSIS — I7300 Raynaud's syndrome without gangrene: Secondary | ICD-10-CM

## 2020-04-09 DIAGNOSIS — M349 Systemic sclerosis, unspecified: Secondary | ICD-10-CM | POA: Diagnosis not present

## 2020-04-09 DIAGNOSIS — Z8669 Personal history of other diseases of the nervous system and sense organs: Secondary | ICD-10-CM

## 2020-04-09 DIAGNOSIS — Z8679 Personal history of other diseases of the circulatory system: Secondary | ICD-10-CM

## 2020-04-09 DIAGNOSIS — R7611 Nonspecific reaction to tuberculin skin test without active tuberculosis: Secondary | ICD-10-CM

## 2020-04-09 NOTE — Patient Instructions (Signed)
   Recommend Shingrix, Tdap, and Pneumovax 23 vaccines.

## 2020-04-10 LAB — COMPLETE METABOLIC PANEL WITH GFR
AG Ratio: 1.6 (calc) (ref 1.0–2.5)
ALT: 25 U/L (ref 6–29)
AST: 23 U/L (ref 10–35)
Albumin: 4 g/dL (ref 3.6–5.1)
Alkaline phosphatase (APISO): 70 U/L (ref 37–153)
BUN/Creatinine Ratio: 16 (calc) (ref 6–22)
BUN: 17 mg/dL (ref 7–25)
CO2: 28 mmol/L (ref 20–32)
Calcium: 9.8 mg/dL (ref 8.6–10.4)
Chloride: 104 mmol/L (ref 98–110)
Creat: 1.08 mg/dL — ABNORMAL HIGH (ref 0.60–0.93)
GFR, Est African American: 58 mL/min/{1.73_m2} — ABNORMAL LOW (ref >=60)
GFR, Est Non African American: 50 mL/min/{1.73_m2} — ABNORMAL LOW (ref >=60)
Globulin: 2.5 g/dL (calc) (ref 1.9–3.7)
Glucose, Bld: 116 mg/dL — ABNORMAL HIGH (ref 65–99)
Potassium: 3.2 mmol/L — ABNORMAL LOW (ref 3.5–5.3)
Sodium: 141 mmol/L (ref 135–146)
Total Bilirubin: 0.6 mg/dL (ref 0.2–1.2)
Total Protein: 6.5 g/dL (ref 6.1–8.1)

## 2020-04-10 LAB — CBC WITH DIFFERENTIAL/PLATELET
Absolute Monocytes: 405 cells/uL (ref 200–950)
Basophils Absolute: 31 cells/uL (ref 0–200)
Basophils Relative: 0.7 %
Eosinophils Absolute: 128 cells/uL (ref 15–500)
Eosinophils Relative: 2.9 %
HCT: 37.6 % (ref 35.0–45.0)
Hemoglobin: 12.8 g/dL (ref 11.7–15.5)
Lymphs Abs: 1267 cells/uL (ref 850–3900)
MCH: 31.1 pg (ref 27.0–33.0)
MCHC: 34 g/dL (ref 32.0–36.0)
MCV: 91.5 fL (ref 80.0–100.0)
MPV: 11.2 fL (ref 7.5–12.5)
Monocytes Relative: 9.2 %
Neutro Abs: 2570 cells/uL (ref 1500–7800)
Neutrophils Relative %: 58.4 %
Platelets: 215 10*3/uL (ref 140–400)
RBC: 4.11 10*6/uL (ref 3.80–5.10)
RDW: 13.7 % (ref 11.0–15.0)
Total Lymphocyte: 28.8 %
WBC: 4.4 10*3/uL (ref 3.8–10.8)

## 2020-04-10 NOTE — Progress Notes (Signed)
GFR is low but is stable.  Potassium is low.  Patient gets labs from her dermatologist.  Please forward labs to her dermatologist.

## 2020-04-14 ENCOUNTER — Encounter: Payer: Self-pay | Admitting: Family Medicine

## 2020-04-14 ENCOUNTER — Other Ambulatory Visit: Payer: Self-pay

## 2020-04-14 ENCOUNTER — Ambulatory Visit (INDEPENDENT_AMBULATORY_CARE_PROVIDER_SITE_OTHER): Payer: Medicare Other | Admitting: Family Medicine

## 2020-04-14 VITALS — BP 110/68 | HR 66 | Temp 97.3°F | Ht 65.0 in | Wt 169.8 lb

## 2020-04-14 DIAGNOSIS — M1990 Unspecified osteoarthritis, unspecified site: Secondary | ICD-10-CM

## 2020-04-14 DIAGNOSIS — I10 Essential (primary) hypertension: Secondary | ICD-10-CM

## 2020-04-14 DIAGNOSIS — M349 Systemic sclerosis, unspecified: Secondary | ICD-10-CM | POA: Diagnosis not present

## 2020-04-14 DIAGNOSIS — Z1211 Encounter for screening for malignant neoplasm of colon: Secondary | ICD-10-CM

## 2020-04-14 DIAGNOSIS — Z0000 Encounter for general adult medical examination without abnormal findings: Secondary | ICD-10-CM | POA: Diagnosis not present

## 2020-04-14 DIAGNOSIS — H20023 Recurrent acute iridocyclitis, bilateral: Secondary | ICD-10-CM

## 2020-04-14 DIAGNOSIS — J301 Allergic rhinitis due to pollen: Secondary | ICD-10-CM | POA: Diagnosis not present

## 2020-04-14 DIAGNOSIS — I2510 Atherosclerotic heart disease of native coronary artery without angina pectoris: Secondary | ICD-10-CM

## 2020-04-14 DIAGNOSIS — E785 Hyperlipidemia, unspecified: Secondary | ICD-10-CM

## 2020-04-14 DIAGNOSIS — Z79899 Other long term (current) drug therapy: Secondary | ICD-10-CM

## 2020-04-14 LAB — POCT URINALYSIS DIP (PROADVANTAGE DEVICE)
Bilirubin, UA: NEGATIVE
Glucose, UA: NEGATIVE mg/dL
Ketones, POC UA: NEGATIVE mg/dL
Nitrite, UA: NEGATIVE
Specific Gravity, Urine: 1.015
Urobilinogen, Ur: 0.2
pH, UA: 7 (ref 5.0–8.0)

## 2020-04-14 MED ORDER — LISINOPRIL-HYDROCHLOROTHIAZIDE 10-12.5 MG PO TABS: 1.0000 | ORAL_TABLET | Freq: Every day | ORAL | 3 refills | Status: DC

## 2020-04-14 MED ORDER — ROSUVASTATIN CALCIUM 40 MG PO TABS: 40.0000 mg | ORAL_TABLET | Freq: Every day | ORAL | 3 refills | Status: DC

## 2020-04-14 MED ORDER — METOPROLOL SUCCINATE ER 50 MG PO TB24: ORAL_TABLET | ORAL | 3 refills | Status: DC

## 2020-04-14 NOTE — Progress Notes (Addendum)
Rhonda Buchanan is a 74 y.o. female who presents for annual wellness visit and follow-up on chronic medical conditions.  She she has scleroderma and is followed by dermatology as well as ophthalmology as she also has iridocyclitis.  She is on high risk medications methotrexate as well as taking eyedrops.  She has also cardiology because of atherosclerosis and her underlying scleroderma and possible effect on the heart.  The arthritis seems to be under good control.  She has no difficulty with her allergies.  She does need follow-up concerning colonoscopy.  Immunizations and Health Maintenance Immunization History  Administered Date(s) Administered  . H1N1 08/22/2009  . Influenza, High Dose Seasonal PF 06/24/2015, 10/05/2016, 08/04/2018  . Influenza,inj,Quad PF,6+ Mos 07/12/2013, 01/15/2015  . Influenza-Unspecified 12/24/2017, 08/04/2018  . PFIZER SARS-COV-2 Vaccination 12/13/2019, 01/08/2020  . Pneumococcal Conjugate-13 05/20/2014  . Pneumococcal Polysaccharide-23 01/31/2007  . Tdap 01/31/2007  . Zoster 07/02/2008   Health Maintenance Due  Topic Date Due  . Fecal DNA (Cologuard)  Never done  . TETANUS/TDAP  01/30/2017    Last Pap smear: aged out  Last mammogram: 07/07/19 Last colonoscopy: cologaurd  Last DEXA: 06/25/14 Dentist:over a year  Ophtho: Q six month Exercise: not at this time  Other doctors caring for patient include: Dr Kathi Ludwig rheumatology, Dr Alfredo Batty, Dr. Haroldine Laws cardio  Advanced directives: Does Patient Have a Medical Advance Directive?: Yes Does patient want to make changes to medical advance directive?: Yes (MAU/Ambulatory/Procedural Areas - Information given)  Depression screen:  See questionnaire below.  Depression screen [facility] 2/9 04/14/2020 12/21/2018 10/19/2017 10/05/2016 06/24/2015  Decreased Interest 0 3 0 0 0  Down, Depressed, Hopeless 0 1 0 0 0  PHQ - 2 Score 0 4 0 0 0  Altered sleeping - 3 - - -  Tired, decreased energy - 3 - - -  Change in appetite  - 0 - - -  Feeling bad or failure about yourself  - 0 - - -  Trouble concentrating - 3 - - -  Moving slowly or fidgety/restless - 0 - - -  Suicidal thoughts - 0 - - -  PHQ-9 Score - 13 - - -  Difficult doing work/chores - Somewhat difficult - - -    Fall Risk Screen: see questionnaire below. Fall Risk  04/14/2020 12/21/2018 10/19/2017 10/05/2016 06/24/2015  Falls in the past year? 0 0 No Yes No  Number falls in past yr: - - - 1 -  Injury with Fall? - - - No -  Risk for fall due to : - - - Other (Comment) -  Risk for fall due to: Comment - - - she had vertigo -    ADL screen:  See questionnaire below Functional Status Survey: Is the patient deaf or have difficulty hearing?: No Does the patient have difficulty seeing, even when wearing glasses/contacts?: No Does the patient have difficulty concentrating, remembering, or making decisions?: No Does the patient have difficulty walking or climbing stairs?: Yes (Stairs) Does the patient have difficulty dressing or bathing?: No Does the patient have difficulty doing errands alone such as visiting a doctor's office or shopping?: No   Review of Systems Constitutional: -, -unexpected weight change, -anorexia, -fatigue ENT: -runny nose, -ear pain, -sore throat,  Cardiology:  -chest pain, -palpitations, -orthopnea, Respiratory: -cough, -shortness of breath, -dyspnea on exertion, -wheezing,  Gastroenterology: -abdominal pain, -nausea, -vomiting, -diarrhea, -constipation, -dysphagia Hematology: -bleeding or bruising problems Musculoskeletal:  -myalgias, -joint swelling, -back pain, - Ophthalmology: -vision changes,  Urology: -dysuria, -difficulty  urinating,  -urinary frequency, -urgency, incontinence Neurology: -, -numbness, , -memory loss, -falls, -dizziness    PHYSICAL EXAM:  LMP 02/16/2003   General Appearance: Alert, cooperative, no distress, appears stated age Head: Normocephalic, without obvious abnormality, atraumatic Eyes: PERRL,  conjunctiva/corneas clear, EOM's intact,  Ears: Normal TM's and external ear canals Nose: Nares normal, mucosa normal, no drainage or sinus tenderness Throat: Lips, mucosa, and tongue normal; teeth and gums normal Neck: Supple, no lymphadenopathy;  thyroid:  no enlargement/tenderness/nodules; no carotid bruit or JVD Lungs: Clear to auscultation bilaterally without wheezes, rales or ronchi; respirations unlabored Heart: Regular rate and rhythm, S1 and S2 normal, no murmur, rubor gallop Abdomen: Soft, non-tender, nondistended, normoactive bowel sounds,  no masses, no hepatosplenomegaly Extremities: No clubbing, cyanosis or edema Pulses: 2+ and symmetric all extremities Lymph nodes: Cervical, supraclavicular, and axillary nodes normal Neurologic:  CNII-XII intact, normal strength, sensation and gait; reflexes 2+ and symmetric throughout Psych: Normal mood, affect, hygiene and grooming.  ASSESSMENT/PLAN: Routine general medical examination at a health care facility - Plan: POCT Urinalysis DIP (Proadvantage Device)  Screening for colon cancer - Plan: Cologuard  Scleroderma ([place])  Allergic rhinitis due to pollen, unspecified seasonality  Essential hypertension - Plan: lisinopril-hydrochlorothiazide (ZESTORETIC) 10-12.5 MG tablet, metoprolol succinate (TOPROL-XL) 50 MG 24 hr tablet  Hyperlipidemia, unspecified hyperlipidemia type - Plan: Lipid panel, rosuvastatin (CRESTOR) 40 MG tablet  Recurrent acute iridocyclitis of both eyes  Arthritis  Atherosclerosis of coronary artery of native heart without angina pectoris, unspecified vessel or lesion type - Plan: Ambulatory referral to Cardiology, rosuvastatin (CRESTOR) 40 MG tablet  High risk medication use She is set up for repeat Cologuard.  Her medications were renewed.  She will continue to be followed by cardiology as well as dermatology and rheumatology.  Her arthritis seems to be quite minimal at the present time.  Allergies seem to be  under good control. Urine dipstick was positive for white cells however she is having no symptoms therefore no intervention at this time.   Discussed at least 30 minutes of aerobic activity at least 5 days/week and weight-bearing exercise 2x/week; Immunization recommendations discussed.  Recommend she get Tdap and Shingrix.  Colonoscopy recommendations reviewed   Medicare Attestation I have personally reviewed: The patient's medical and social history Their use of alcohol, tobacco or illicit drugs Their current medications and supplements The patient's functional ability including ADLs,fall risks, home safety risks, cognitive, and hearing and visual impairment Diet and physical activities Evidence for depression or mood disorders  The patient's weight, height, and BMI have been recorded in the chart.  I have made referrals, counseling, and provided education to the patient based on review of the above and I have provided the patient with a written personalized care plan for preventive services.     Jill Alexanders, MD   04/14/2020

## 2020-04-14 NOTE — Patient Instructions (Addendum)
  Rhonda Buchanan , Thank you for taking time to come for your Medicare Wellness Visit. I appreciate your ongoing commitment to your health goals. Please review the following plan we discussed and let me know if I can assist you in the future.   These are the goals we discussed:   This is a list of the screening recommended for you and due dates:  Health Maintenance  Topic Date Due  . Cologuard (Stool DNA test)  Never done  . Tetanus Vaccine  01/30/2017  . Flu Shot  05/18/2020  . DEXA scan (bone density measurement)  Completed  . COVID-19 Vaccine  Completed  .  Hepatitis C: One time screening is recommended by Center for Disease Control  (CDC) for  adults born from [birth year] through 1966.   Completed  . Pneumonia vaccines  Discontinued

## 2020-04-15 ENCOUNTER — Telehealth: Payer: Self-pay | Admitting: Family Medicine

## 2020-04-15 LAB — LIPID PANEL
Chol/HDL Ratio: 3.4 ratio (ref 0.0–4.4)
Cholesterol, Total: 216 mg/dL — ABNORMAL HIGH (ref 100–199)
HDL: 63 mg/dL (ref >39)
LDL Chol Calc (NIH): 134 mg/dL — ABNORMAL HIGH (ref 0–99)
Triglycerides: 109 mg/dL (ref 0–149)
VLDL Cholesterol Cal: 19 mg/dL (ref 5–40)

## 2020-04-15 NOTE — Telephone Encounter (Signed)
She can get lo salt over-the-counter and use a teaspoon per day

## 2020-04-15 NOTE — Telephone Encounter (Signed)
Pt left message that she had appt with you yesterday.  She said you guys forgot to talk about what you want her to do about her low Potassium.  Please advise pt [phone]

## 2020-04-15 NOTE — Telephone Encounter (Signed)
LVM for pt to call back for info . KH °

## 2020-04-16 NOTE — Telephone Encounter (Signed)
Pt was advised and appt was made. [place]

## 2020-04-16 NOTE — Telephone Encounter (Signed)
yes

## 2020-04-16 NOTE — Telephone Encounter (Signed)
Pt was advised to start lo salt and would like to know if she should schedule a follow up in a month or so concerning her potassium .Please advise [facility]

## 2020-05-13 DIAGNOSIS — Z1211 Encounter for screening for malignant neoplasm of colon: Secondary | ICD-10-CM | POA: Diagnosis not present

## 2020-05-13 LAB — COLOGUARD: Cologuard: NEGATIVE

## 2020-05-20 ENCOUNTER — Other Ambulatory Visit: Payer: Self-pay

## 2020-05-20 ENCOUNTER — Ambulatory Visit (INDEPENDENT_AMBULATORY_CARE_PROVIDER_SITE_OTHER): Payer: Medicare Other | Admitting: Family Medicine

## 2020-05-20 ENCOUNTER — Encounter: Payer: Self-pay | Admitting: Family Medicine

## 2020-05-20 VITALS — BP 118/72 | HR 68 | Temp 97.5°F | Wt 169.4 lb

## 2020-05-20 DIAGNOSIS — R5383 Other fatigue: Secondary | ICD-10-CM | POA: Diagnosis not present

## 2020-05-20 DIAGNOSIS — E876 Hypokalemia: Secondary | ICD-10-CM | POA: Diagnosis not present

## 2020-05-20 NOTE — Progress Notes (Signed)
   Subjective:    Patient ID: Rhonda Buchanan, female    DOB: 1946-01-04, 74 y.o.   MRN: [id number]  HPI she is here for a recheck.  On her last visit her potassium was slightly low.  She has been supplementing her diet with extra potassium.  She also complains of some fatigue but is very vague on her symptoms.  No good history of hot or cold intolerance, skin or hair changes.  There is question whether some of the fatigue is actually psychological as she says she has no desire.  Her sleep seems to be fairly good.   Review of Systems     Objective:   Physical Exam alert and in no distress otherwise not examined       Assessment & Plan:  Fatigue, unspecified type - Plan: TSH, Comprehensive metabolic panel  Hypokalemia - Plan: Comprehensive metabolic panel I explained that fatigue in and of itself is very difficult to fully assess.  Recommend she keep track of any other symptoms that might occur with him.

## 2020-05-21 LAB — COMPREHENSIVE METABOLIC PANEL
ALT: 25 IU/L (ref 0–32)
AST: 27 IU/L (ref 0–40)
Albumin/Globulin Ratio: 1.9 (ref 1.2–2.2)
Albumin: 4.3 g/dL (ref 3.7–4.7)
Alkaline Phosphatase: 79 IU/L (ref 48–121)
BUN/Creatinine Ratio: 17 (ref 12–28)
BUN: 18 mg/dL (ref 8–27)
Bilirubin Total: 0.5 mg/dL (ref 0.0–1.2)
CO2: 25 mmol/L (ref 20–29)
Calcium: 9.7 mg/dL (ref 8.7–10.3)
Chloride: 100 mmol/L (ref 96–106)
Creatinine, Ser: 1.09 mg/dL — ABNORMAL HIGH (ref 0.57–1.00)
GFR calc Af Amer: 57 mL/min/{1.73_m2} — ABNORMAL LOW (ref >59)
GFR calc non Af Amer: 50 mL/min/{1.73_m2} — ABNORMAL LOW (ref >59)
Globulin, Total: 2.3 g/dL (ref 1.5–4.5)
Glucose: 104 mg/dL — ABNORMAL HIGH (ref 65–99)
Potassium: 3.1 mmol/L — ABNORMAL LOW (ref 3.5–5.2)
Sodium: 138 mmol/L (ref 134–144)
Total Protein: 6.6 g/dL (ref 6.0–8.5)

## 2020-05-21 LAB — TSH: TSH: 1.66 u[IU]/mL (ref 0.450–4.500)

## 2020-05-22 NOTE — Addendum Note (Signed)
Addended by: Denita Lung on: 05/22/2020 01:32 PM   Modules accepted: Orders

## 2020-05-22 NOTE — Progress Notes (Signed)
Pt was advised of negative cologuard. KH 

## 2020-06-06 ENCOUNTER — Encounter: Payer: Self-pay | Admitting: Family Medicine

## 2020-06-24 ENCOUNTER — Other Ambulatory Visit: Payer: Medicare Other

## 2020-06-24 DIAGNOSIS — E876 Hypokalemia: Secondary | ICD-10-CM

## 2020-06-25 LAB — BASIC METABOLIC PANEL
BUN/Creatinine Ratio: 19 (ref 12–28)
BUN: 18 mg/dL (ref 8–27)
CO2: 30 mmol/L — ABNORMAL HIGH (ref 20–29)
Calcium: 9.7 mg/dL (ref 8.7–10.3)
Chloride: 104 mmol/L (ref 96–106)
Creatinine, Ser: 0.96 mg/dL (ref 0.57–1.00)
GFR calc Af Amer: 67 mL/min/{1.73_m2} (ref >59)
GFR calc non Af Amer: 58 mL/min/{1.73_m2} — ABNORMAL LOW (ref >59)
Glucose: 98 mg/dL (ref 65–99)
Potassium: 3.7 mmol/L (ref 3.5–5.2)
Sodium: 143 mmol/L (ref 134–144)

## 2020-06-30 ENCOUNTER — Other Ambulatory Visit (HOSPITAL_COMMUNITY): Payer: Self-pay | Admitting: *Deleted

## 2020-06-30 DIAGNOSIS — L940 Localized scleroderma [morphea]: Secondary | ICD-10-CM | POA: Diagnosis not present

## 2020-06-30 DIAGNOSIS — M349 Systemic sclerosis, unspecified: Secondary | ICD-10-CM

## 2020-06-30 DIAGNOSIS — Z79899 Other long term (current) drug therapy: Secondary | ICD-10-CM | POA: Diagnosis not present

## 2020-07-21 DIAGNOSIS — Z1231 Encounter for screening mammogram for malignant neoplasm of breast: Secondary | ICD-10-CM | POA: Diagnosis not present

## 2020-07-21 LAB — HM MAMMOGRAPHY

## 2020-08-01 ENCOUNTER — Encounter: Payer: Self-pay | Admitting: Family Medicine

## 2020-08-01 ENCOUNTER — Ambulatory Visit (INDEPENDENT_AMBULATORY_CARE_PROVIDER_SITE_OTHER): Payer: Medicare Other | Admitting: Family Medicine

## 2020-08-01 ENCOUNTER — Other Ambulatory Visit: Payer: Self-pay

## 2020-08-01 VITALS — BP 128/78 | HR 76 | Wt 173.2 lb

## 2020-08-01 DIAGNOSIS — R109 Unspecified abdominal pain: Secondary | ICD-10-CM

## 2020-08-01 DIAGNOSIS — N3001 Acute cystitis with hematuria: Secondary | ICD-10-CM | POA: Diagnosis not present

## 2020-08-01 DIAGNOSIS — R10811 Right upper quadrant abdominal tenderness: Secondary | ICD-10-CM

## 2020-08-01 DIAGNOSIS — R300 Dysuria: Secondary | ICD-10-CM | POA: Diagnosis not present

## 2020-08-01 LAB — POCT URINALYSIS DIP (CLINITEK)
Bilirubin, UA: NEGATIVE
Glucose, UA: NEGATIVE mg/dL
Ketones, POC UA: NEGATIVE mg/dL
Nitrite, UA: NEGATIVE
Spec Grav, UA: 1.01 (ref 1.010–1.025)
Urobilinogen, UA: 0.2 E.U./dL
pH, UA: 6 (ref 5.0–8.0)

## 2020-08-01 MED ORDER — CEPHALEXIN 500 MG PO CAPS: 500.0000 mg | ORAL_CAPSULE | Freq: Two times a day (BID) | ORAL | 0 refills | Status: DC

## 2020-08-01 NOTE — Progress Notes (Signed)
Established patient visit   Patient: Rhonda [place]   DOB: 1946/05/06   74 y.o. Female  MRN: [id number] Visit Date: 08/01/2020  Today's healthcare provider: Harland Dingwall, NP-C   No chief complaint on file.  Subjective    HPI  Side pain Patient presents today for right side pain. Patient denies any urinary symptoms such as frequency, urgency at the moment.  She reports drinking a lot of water and cranberry juice. States pain started several weeks ago. Pain is dull and intermittent. Pain is now radiating around to her right flank.  Denies injury. She reports having briefs episodes of nausea but this lasts only 5 minutes or less. Not associated with eating.  Does not seem to be aggravated with movement.  Denies fever, chills, dizziness, chest pain, palpitations, shortness of breath, abdominal pain, vomiting or diarrhea.   States today she started having burning with urination. No urgency or frequency.    Medications: Outpatient Medications Prior to Visit  Medication Sig   Brinzolamide-Brimonidine (SIMBRINZA) 1-0.2 % SUSP Apply 1 drop to eye 3 (three) times daily.    calcium carbonate (OS-CAL) 600 MG TABS Take 600 mg by mouth daily.     fluocinonide ointment (LIDEX) 0.05 % Apply twice a day to affected areas   folic acid (FOLVITE) 1 MG tablet Take 3 mg by mouth daily.    lisinopril-hydrochlorothiazide (ZESTORETIC) 10-12.5 MG tablet Take 1 tablet by mouth daily.   loratadine (CLARITIN) 10 MG tablet Take 10 mg by mouth as needed.    methotrexate (RHEUMATREX) 2.5 MG tablet TAKE( 6 )15MG  ONCE A WEEK Caution:Chemotherapy. Protect from light.    metoprolol succinate (TOPROL-XL) 50 MG 24 hr tablet TAKE 1 TABLET BY MOUTH DAILY. TAKE WITH OR IMMEDIATELY FOLLOWING A MEAL.   multivitamin (THERAGRAN) per tablet Take 1 tablet by mouth daily.     nystatin (MYCOSTATIN/NYSTOP) powder Apply to moist areas as needed   prednisoLONE acetate (PRED FORTE) 1 % ophthalmic suspension  Place 1 drop into both eyes daily.    rosuvastatin (CRESTOR) 40 MG tablet Take 1 tablet (40 mg total) by mouth daily.   timolol (BETIMOL) 0.5 % ophthalmic solution Place 1 drop into both eyes daily.   No facility-administered medications prior to visit.    Review of Systems  Pertinent positives and negatives in the history of present illness.    Objective    BP 128/78 (BP Location: Right Arm, Patient Position: Sitting, Cuff Size: Large)    Pulse 76    Wt 173 lb 3.2 oz (78.6 kg)    LMP 02/16/2003    SpO2 98%    BMI 28.82 kg/m    Physical Exam Constitutional:      Appearance: Normal appearance.  Skin:    General: Skin is warm and dry.  Neurological:     General: No focal deficit present.     Mental Status: She is alert and oriented to person, place, and time.  Psychiatric:        Mood and Affect: Mood normal.        Behavior: Behavior normal.         Assessment & Plan    Acute cystitis with hematuria - Plan: Urine Culture, cephALEXin (KEFLEX) 500 MG capsule  Side pain - Plan: POCT URINALYSIS DIP (CLINITEK), CBC with Differential/Platelet, Comprehensive metabolic panel, US Abdomen Limited RUQ (LIVER/GB)  Dysuria - Plan: Urine Culture  Right upper quadrant abdominal tenderness without rebound tenderness - Plan: CBC with Differential/Platelet, Comprehensive metabolic panel,  US Abdomen Limited RUQ (LIVER/GB)       Harland Dingwall, NP-C  [place] [phone] (phone) [phone] (fax)  [place]

## 2020-08-02 LAB — CBC WITH DIFFERENTIAL/PLATELET
Basophils Absolute: 0 10*3/uL (ref 0.0–0.2)
Basos: 1 %
EOS (ABSOLUTE): 0.2 10*3/uL (ref 0.0–0.4)
Eos: 3 %
Hematocrit: 39.2 % (ref 34.0–46.6)
Hemoglobin: 13.5 g/dL (ref 11.1–15.9)
Immature Grans (Abs): 0 10*3/uL (ref 0.0–0.1)
Immature Granulocytes: 0 %
Lymphocytes Absolute: 1.4 10*3/uL (ref 0.7–3.1)
Lymphs: 30 %
MCH: 32.2 pg (ref 26.6–33.0)
MCHC: 34.4 g/dL (ref 31.5–35.7)
MCV: 94 fL (ref 79–97)
Monocytes Absolute: 0.6 10*3/uL (ref 0.1–0.9)
Monocytes: 12 %
Neutrophils Absolute: 2.5 10*3/uL (ref 1.4–7.0)
Neutrophils: 54 %
Platelets: 196 10*3/uL (ref 150–450)
RBC: 4.19 x10E6/uL (ref 3.77–5.28)
RDW: 14.5 % (ref 11.7–15.4)
WBC: 4.8 10*3/uL (ref 3.4–10.8)

## 2020-08-02 LAB — COMPREHENSIVE METABOLIC PANEL
ALT: 25 IU/L (ref 0–32)
AST: 26 IU/L (ref 0–40)
Albumin/Globulin Ratio: 1.7 (ref 1.2–2.2)
Albumin: 4.4 g/dL (ref 3.7–4.7)
Alkaline Phosphatase: 83 IU/L (ref 44–121)
BUN/Creatinine Ratio: 14 (ref 12–28)
BUN: 14 mg/dL (ref 8–27)
Bilirubin Total: 0.6 mg/dL (ref 0.0–1.2)
CO2: 28 mmol/L (ref 20–29)
Calcium: 9.9 mg/dL (ref 8.7–10.3)
Chloride: 99 mmol/L (ref 96–106)
Creatinine, Ser: 1.02 mg/dL — ABNORMAL HIGH (ref 0.57–1.00)
GFR calc Af Amer: 62 mL/min/{1.73_m2} (ref >59)
GFR calc non Af Amer: 54 mL/min/{1.73_m2} — ABNORMAL LOW (ref >59)
Globulin, Total: 2.6 g/dL (ref 1.5–4.5)
Glucose: 93 mg/dL (ref 65–99)
Potassium: 3.5 mmol/L (ref 3.5–5.2)
Sodium: 138 mmol/L (ref 134–144)
Total Protein: 7 g/dL (ref 6.0–8.5)

## 2020-08-03 LAB — URINE CULTURE

## 2020-08-03 NOTE — Progress Notes (Deleted)
Established patient visit   Patient: Rhonda [place]   DOB: 01-14-1946   74 y.o. Female  MRN: [id number] Visit Date: 08/01/2020  Today's healthcare provider: Harland Dingwall, NP-C   No chief complaint on file.  Subjective    Flank Pain    Side pain Patient presents today for right side pain. Patient denies any urinary symptoms such as frequency, urgency at the moment.  She reports drinking a lot of water and cranberry juice. States pain started several weeks ago. Pain is dull and intermittent. Pain is now radiating around to her right flank.  Denies injury. She reports having briefs episodes of nausea but this lasts only 5 minutes or less. Not associated with eating.  Does not seem to be aggravated with movement.  Denies fever, chills, dizziness, chest pain, palpitations, shortness of breath, abdominal pain, vomiting or diarrhea.   States today she started having burning with urination. No urgency or frequency.    Medications: Outpatient Medications Prior to Visit  Medication Sig  . Brinzolamide-Brimonidine (SIMBRINZA) 1-0.2 % SUSP Apply 1 drop to eye 3 (three) times daily.   . calcium carbonate (OS-CAL) 600 MG TABS Take 600 mg by mouth daily.    . fluocinonide ointment (LIDEX) 0.05 % Apply twice a day to affected areas  . folic acid (FOLVITE) 1 MG tablet Take 3 mg by mouth daily.   Marland Kitchen lisinopril-hydrochlorothiazide (ZESTORETIC) 10-12.5 MG tablet Take 1 tablet by mouth daily.  Marland Kitchen loratadine (CLARITIN) 10 MG tablet Take 10 mg by mouth as needed.   . methotrexate (RHEUMATREX) 2.5 MG tablet TAKE( 6 )15MG  ONCE A WEEK Caution:Chemotherapy. Protect from light.   . metoprolol succinate (TOPROL-XL) 50 MG 24 hr tablet TAKE 1 TABLET BY MOUTH DAILY. TAKE WITH OR IMMEDIATELY FOLLOWING A MEAL.  . multivitamin (THERAGRAN) per tablet Take 1 tablet by mouth daily.    Marland Kitchen nystatin (MYCOSTATIN/NYSTOP) powder Apply to moist areas as needed  . prednisoLONE acetate (PRED FORTE) 1 % ophthalmic  suspension Place 1 drop into both eyes daily.   . rosuvastatin (CRESTOR) 40 MG tablet Take 1 tablet (40 mg total) by mouth daily.  . timolol (BETIMOL) 0.5 % ophthalmic solution Place 1 drop into both eyes daily.   No facility-administered medications prior to visit.    Review of Systems  Genitourinary: Positive for flank pain.    Pertinent positives and negatives in the history of present illness.    Objective    BP 128/78 (BP Location: Right Arm, Patient Position: Sitting, Cuff Size: Large)   Pulse 76   Wt 173 lb 3.2 oz (78.6 kg)   LMP 02/16/2003   SpO2 98%   BMI 28.82 kg/m    Physical Exam Constitutional:      Appearance: Normal appearance.  Skin:    General: Skin is warm and dry.  Neurological:     General: No focal deficit present.     Mental Status: She is alert and oriented to person, place, and time.  Psychiatric:        Mood and Affect: Mood normal.        Behavior: Behavior normal.         Assessment & Plan    Acute cystitis with hematuria - Plan: Urine Culture, cephALEXin (KEFLEX) 500 MG capsule  Side pain - Plan: POCT URINALYSIS DIP (CLINITEK), CBC with Differential/Platelet, Comprehensive metabolic panel, US Abdomen Limited RUQ (LIVER/GB)  Dysuria - Plan: Urine Culture  Right upper quadrant abdominal tenderness without rebound tenderness - Plan:  CBC with Differential/Platelet, Comprehensive metabolic panel, US Abdomen Limited RUQ (LIVER/GB)       Harland Dingwall, NP-C  [place] [phone] (phone) [phone] (fax)  [place]

## 2020-08-03 NOTE — Progress Notes (Signed)
Established patient visit   Patient: Rhonda [place]   DOB: Mar 16, 1946   74 y.o. Female  MRN: [id number] Visit Date: 08/01/2020  Today's healthcare provider: Harland Dingwall, NP-C   No chief complaint on file.  Subjective    HPI  Side pain Patient presents today for right side pain. Patient denies any urinary symptoms such as frequency, urgency at the moment.  She does report that she started having burning with urination this morning and started drinking a lot of water and cranberry juice. States pain started several weeks ago. Pain is dull and intermittent. Pain is now radiating around to her right flank.  Denies injury. She reports having brief episodes of nausea but this lasts only 5 minutes or less.  States her pain is not made worse or better with eating.  No early satiety. States her pain does not seem to be aggravated or alleviated by movement.  Denies fever, chills, dizziness, chest pain, palpitations, shortness of breath, vomiting or diarrhea.    Medications: Outpatient Medications Prior to Visit  Medication Sig  . Brinzolamide-Brimonidine (SIMBRINZA) 1-0.2 % SUSP Apply 1 drop to eye 3 (three) times daily.   . calcium carbonate (OS-CAL) 600 MG TABS Take 600 mg by mouth daily.    . fluocinonide ointment (LIDEX) 0.05 % Apply twice a day to affected areas  . folic acid (FOLVITE) 1 MG tablet Take 3 mg by mouth daily.   Marland Kitchen lisinopril-hydrochlorothiazide (ZESTORETIC) 10-12.5 MG tablet Take 1 tablet by mouth daily.  Marland Kitchen loratadine (CLARITIN) 10 MG tablet Take 10 mg by mouth as needed.   . methotrexate (RHEUMATREX) 2.5 MG tablet TAKE( 6 )15MG  ONCE A WEEK Caution:Chemotherapy. Protect from light.   . metoprolol succinate (TOPROL-XL) 50 MG 24 hr tablet TAKE 1 TABLET BY MOUTH DAILY. TAKE WITH OR IMMEDIATELY FOLLOWING A MEAL.  . multivitamin (THERAGRAN) per tablet Take 1 tablet by mouth daily.    Marland Kitchen nystatin (MYCOSTATIN/NYSTOP) powder Apply to moist areas as needed  . prednisoLONE  acetate (PRED FORTE) 1 % ophthalmic suspension Place 1 drop into both eyes daily.   . rosuvastatin (CRESTOR) 40 MG tablet Take 1 tablet (40 mg total) by mouth daily.  . timolol (BETIMOL) 0.5 % ophthalmic solution Place 1 drop into both eyes daily.   No facility-administered medications prior to visit.    Review of Systems  Pertinent positives and negatives in the history of present illness.    Objective    BP 128/78 (BP Location: Right Arm, Patient Position: Sitting, Cuff Size: Large)   Pulse 76   Wt 173 lb 3.2 oz (78.6 kg)   LMP 02/16/2003   SpO2 98%   BMI 28.82 kg/m    Physical Exam Constitutional:      Appearance: Normal appearance.  Skin:    General: Skin is warm and dry.  Neurological:     General: No focal deficit present.     Mental Status: She is alert and oriented to person, place, and time.  Psychiatric:        Mood and Affect: Mood normal.        Behavior: Behavior normal.  no acute distress.   Neck is supple without adenopathy or thyromegaly.  Cardiac exam shows a regular sinus rhythm without murmurs or gallops.  Lungs are clear to auscultation. Abdomen is soft, nondistended, normal bowel sounds, right upper quadrant is tender to palpation without rebound or guarding.  No referred pain.  Otherwise her abdomen is nontender.  No CVA tenderness  Assessment & Plan    Acute cystitis with hematuria - Plan: Urine Culture, cephALEXin (KEFLEX) 500 MG capsule -I will cover her with an antibiotic.  Keflex chosen due to interactions with methotrexate.  I will send her urine for culture.  Discussed that I would like for her to recheck her urine in 2 weeks since she did have a trace of blood today.  Unlikely this is causing her side pain since her urinary symptoms only started today.  Side pain - Plan: POCT URINALYSIS DIP (CLINITEK), CBC with Differential/Platelet, Comprehensive metabolic panel, US Abdomen Limited RUQ (LIVER/GB) -Discussed and reviewed her  ultrasound from 2019 when she had right upper quadrant pain.  She reports this is a different pain and in a different area.  Concerning that she is so tender to palpation in her right upper quadrant.  I will get an ultrasound and check labs.  She is aware that if her pain becomes more constant and severe that she should be seen again.  She can take Tylenol for her pain  Dysuria - Plan: Urine Culture Antibiotic prescribed.  Encouraged good water intake  Right upper quadrant abdominal tenderness without rebound tenderness - Plan: CBC with Differential/Platelet, Comprehensive metabolic panel, US Abdomen Limited RUQ (LIVER/GB)       Harland Dingwall, NP-C  [place] [phone] (phone) [phone] (fax)  [place]

## 2020-08-11 ENCOUNTER — Ambulatory Visit
Admission: RE | Admit: 2020-08-11 | Discharge: 2020-08-11 | Disposition: A | Discharge status: Home / Self Care | Payer: Medicare Other | Source: Ambulatory Visit | Attending: Family Medicine | Admitting: Family Medicine

## 2020-08-11 DIAGNOSIS — R10811 Right upper quadrant abdominal tenderness: Secondary | ICD-10-CM

## 2020-08-11 DIAGNOSIS — R109 Unspecified abdominal pain: Secondary | ICD-10-CM

## 2020-08-11 DIAGNOSIS — R1011 Right upper quadrant pain: Secondary | ICD-10-CM | POA: Diagnosis not present

## 2020-08-12 ENCOUNTER — Other Ambulatory Visit (HOSPITAL_COMMUNITY)
Admission: RE | Admit: 2020-08-12 | Discharge: 2020-08-12 | Disposition: A | Discharge status: Home / Self Care | Payer: Medicare Other | Source: Ambulatory Visit | Attending: Internal Medicine | Admitting: Internal Medicine

## 2020-08-12 DIAGNOSIS — Z20822 Contact with and (suspected) exposure to covid-19: Secondary | ICD-10-CM | POA: Insufficient documentation

## 2020-08-12 DIAGNOSIS — Z01812 Encounter for preprocedural laboratory examination: Secondary | ICD-10-CM | POA: Insufficient documentation

## 2020-08-12 LAB — SARS CORONAVIRUS 2 (TAT 6-24 HRS): SARS Coronavirus 2: NEGATIVE

## 2020-08-12 NOTE — Progress Notes (Signed)
Her [place] is normal. I sent her a message via Mychart as well.

## 2020-08-14 NOTE — Progress Notes (Signed)
Patient ID: Rhonda [place], female   DOB: 09-24-1946, 74 y.o.   MRN: [id number]   [facility]  NOTE  Referring Physician: Dr. Kirke Corin Primary Care: Dr. Glo Herring   HPI: Rhonda M Velez is a 74 y.o. 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 Patrecia Pour for screening for [facility].  She presents today for yearly follow up. Last seen 2/20. Sleep study ordered at last visit but not done due to COVID restrictions. Echo and PFTs have been stable without evidence of PAH. Says she is doing pretty good. Not overly active but able to go to store and do ADLs without too much problem. Does have fatigue. Daughter says she snores when she is very tired. No edema, orthopnea or PND.    Echo 08/15/20 : EF 60-65% RV normal. No evidence of PAH. Aov calcified no significant AS Personally reviewed  PFTs 10/21 FEV1  1.86 (98%) FVC    2.25 L (92%) DLCO 73%   PFTs 2/20 FEV1  2.0L (111%) FVC    2.4L (103% DLCO 70%  09/2017 Stress Echo EF 65-70%, Grade 1 DD. No chest pain. + Hypertensive BP response. No stress induced WMAs.   08/12/2017 ECHO 60-65%. No evidence PAH. Mild AS Normal RV   Hi-res CT of chest done 8/17 for abnormal DLCO 1. No evidence of interstitial lung disease. 2. No evidence of achalasia. 3. No acute findings in the thorax. 4. Aortic atherosclerosis, in addition to left main and 3 vessel coronary artery disease. Please note that although the presence of coronary artery calcium documents the presence of coronary artery disease, the severity of this disease and any potential stenosis cannot be assessed on this non-gated CT examination. Assessment for potential risk factor modification, dietary therapy or pharmacologic therapy may be warranted, if clinically indicated. 5. There are calcifications of the aortic valve. Echocardiographic correlation for evaluation of potential valvular dysfunction may be warranted if clinically indicated.  PFTs 2017  FEV1  2.07 (109%) FVC  2.54 (104%) DLCO 64%  PFTs 08/12/17-  FEV1 2.03 (109%) FVC  2.43 (101%) DLCO 60%    Past Medical History:  Diagnosis Date  . Allergy   . Arthritis   . Hypercholesterolemia   . Hypertension   . PUD (peptic ulcer disease)   . Scleroderma ([place])     Current Outpatient Medications  Medication Sig Dispense Refill  . Brinzolamide-Brimonidine (SIMBRINZA) 1-0.2 % SUSP Apply 1 drop to eye 3 (three) times daily.     . calcium carbonate (OS-CAL) 600 MG TABS Take 600 mg by mouth daily.      . fluocinonide ointment (LIDEX) 0.05 % Apply twice a day to affected areas    . folic acid (FOLVITE) 1 MG tablet Take 3 mg by mouth daily.     Marland Kitchen lisinopril-hydrochlorothiazide (ZESTORETIC) 10-12.5 MG tablet Take 1 tablet by mouth daily. 90 tablet 3  . loratadine (CLARITIN) 10 MG tablet Take 10 mg by mouth as needed.     . methotrexate (RHEUMATREX) 2.5 MG tablet TAKE( 6 )15MG  ONCE A WEEK Caution:Chemotherapy. Protect from light.     . metoprolol succinate (TOPROL-XL) 50 MG 24 hr tablet TAKE 1 TABLET BY MOUTH DAILY. TAKE WITH OR IMMEDIATELY FOLLOWING A MEAL. 90 tablet 3  . multivitamin (THERAGRAN) per tablet Take 1 tablet by mouth daily.      Marland Kitchen nystatin (MYCOSTATIN/NYSTOP) powder Apply to moist areas as needed    . prednisoLONE acetate (PRED FORTE) 1 % ophthalmic suspension Place 1  drop into both eyes daily.     . rosuvastatin (CRESTOR) 40 MG tablet Take 1 tablet (40 mg total) by mouth daily. 90 tablet 3  . timolol (BETIMOL) 0.5 % ophthalmic solution Place 1 drop into both eyes daily.     No current facility-administered medications for this encounter.   Allergies  Allergen Reactions  . Ibuprofen     GI upset    Social History   Socioeconomic History  . Marital status: Divorced    Spouse name: Not on file  . Number of children: Not on file  . Years of education: Not on file  . Highest education level: Not on file  Occupational History  . Not on file  Tobacco Use  . Smoking  status: Never Smoker  . Smokeless tobacco: Never Used  Vaping Use  . Vaping Use: Never used  Substance and Sexual Activity  . Alcohol use: No  . Drug use: No  . Sexual activity: Not Currently  Other Topics Concern  . Not on file  Social History Narrative  . Not on file   Social Determinants of Health   Financial Resource Strain:   . Difficulty of Paying Living Expenses: Not on file  Food Insecurity:   . Worried About Charity fundraiser in the Last Year: Not on file  . Ran Out of Food in the Last Year: Not on file  Transportation Needs:   . Lack of Transportation (Medical): Not on file  . Lack of Transportation (Non-Medical): Not on file  Physical Activity:   . Days of Exercise per Week: Not on file  . Minutes of Exercise per Session: Not on file  Stress:   . Feeling of Stress : Not on file  Social Connections:   . Frequency of Communication with Friends and Family: Not on file  . Frequency of Social Gatherings with Friends and Family: Not on file  . Attends Religious Services: Not on file  . Active Member of Clubs or Organizations: Not on file  . Attends Archivist Meetings: Not on file  . Marital Status: Not on file  Intimate Partner Violence:   . Fear of Current or Ex-Partner: Not on file  . Emotionally Abused: Not on file  . Physically Abused: Not on file  . Sexually Abused: Not on file      Family History  Problem Relation Age of Onset  . Ulcers Brother   . Heart disease Brother 35       MI  . Cancer Father   . Ulcers Sister   . Cancer Maternal Grandmother   . Heart disease Maternal Grandfather     Vitals:   08/15/20 1505  BP: 112/82  Pulse: (!) 59  SpO2: 100%  Weight: 77.3 kg (170 lb 6.4 oz)  Height: 5\' 6"  (1.676 m)   Wt Readings from Last 3 Encounters:  08/15/20 77.3 kg (170 lb 6.4 oz)  08/01/20 78.6 kg (173 lb 3.2 oz)  05/20/20 76.8 kg (169 lb 6.4 oz)    PHYSICAL EXAM: General:  Well appearing. No resp difficulty HEENT:  normal Neck: supple. no JVD. Carotids 2+ bilat; no bruits. No lymphadenopathy or thryomegaly appreciated. Cor: PMI nondisplaced. Regular rate & rhythm. 2/6 SEM RUSB  Lungs: clear Abdomen: soft, nontender, nondistended. No hepatosplenomegaly. No bruits or masses. Good bowel sounds. Extremities: no cyanosis, clubbing, rash, edema Neuro: alert & orientedx3, cranial nerves grossly intact. moves all 4 extremities w/o difficulty. Affect pleasant  ECG: NSR 62 No ST-T  wave abnormalities. Personally reviewed   ASSESSMENT & PLAN: 1. Scleroderma - 09/2017 Stress Echo EF 65-70%, Grade 1 DD. No chest pain. + Hypertensive BP response. No stress induced WMAs - Echo 2/20 EF 55-60% Grade II DD. Thickened Aov without overt AS - Echo 08/15/20 : EF 60-65% RV normal. No evidence of PAH. Aov calcified no significant AS Personally reviewed - Echo and PFts stable. No evidence of PAH. Can go to every 2 year f/u unless symptoms dictate otherwise 2. HTN - Blood pressure well controlled. Continue current regimen. 3. Very mild aortic stenosis - AoV quite calcified but no frank AS. Will need to follow 4. Coronary calcifications - By CT scan. - 09/2017 Stress Echo EF 65-70%, Grade 1 DD. No chest pain. + Hypertensive BP response. No stress induced WMAs - No s/s of ischemia.    5. Fatigue - STOPBANG is at least 4. Recommend sleep study. She wants to defer for now.   Glori Bickers, MD  3:46 PM

## 2020-08-15 ENCOUNTER — Encounter (HOSPITAL_COMMUNITY): Payer: Self-pay | Admitting: Internal Medicine

## 2020-08-15 ENCOUNTER — Ambulatory Visit (HOSPITAL_BASED_OUTPATIENT_CLINIC_OR_DEPARTMENT_OTHER)
Admission: RE | Admit: 2020-08-15 | Discharge: 2020-08-15 | Disposition: A | Discharge status: Home / Self Care | Payer: Medicare Other | Source: Ambulatory Visit | Attending: Internal Medicine | Admitting: Internal Medicine

## 2020-08-15 ENCOUNTER — Ambulatory Visit (HOSPITAL_COMMUNITY)
Admission: RE | Admit: 2020-08-15 | Discharge: 2020-08-15 | Disposition: A | Discharge status: Home / Self Care | Payer: Medicare Other | Source: Ambulatory Visit | Attending: Internal Medicine | Admitting: Internal Medicine

## 2020-08-15 ENCOUNTER — Other Ambulatory Visit: Payer: Self-pay

## 2020-08-15 VITALS — BP 112/82 | HR 59 | Ht 66.0 in | Wt 170.4 lb

## 2020-08-15 DIAGNOSIS — M349 Systemic sclerosis, unspecified: Secondary | ICD-10-CM | POA: Diagnosis not present

## 2020-08-15 DIAGNOSIS — I700 Atherosclerosis of aorta: Secondary | ICD-10-CM | POA: Diagnosis not present

## 2020-08-15 DIAGNOSIS — Z79899 Other long term (current) drug therapy: Secondary | ICD-10-CM | POA: Insufficient documentation

## 2020-08-15 DIAGNOSIS — I2510 Atherosclerotic heart disease of native coronary artery without angina pectoris: Secondary | ICD-10-CM | POA: Insufficient documentation

## 2020-08-15 DIAGNOSIS — E785 Hyperlipidemia, unspecified: Secondary | ICD-10-CM | POA: Diagnosis not present

## 2020-08-15 DIAGNOSIS — I10 Essential (primary) hypertension: Secondary | ICD-10-CM | POA: Insufficient documentation

## 2020-08-15 DIAGNOSIS — I350 Nonrheumatic aortic (valve) stenosis: Secondary | ICD-10-CM | POA: Diagnosis not present

## 2020-08-15 DIAGNOSIS — I352 Nonrheumatic aortic (valve) stenosis with insufficiency: Secondary | ICD-10-CM | POA: Insufficient documentation

## 2020-08-15 DIAGNOSIS — E7800 Pure hypercholesterolemia, unspecified: Secondary | ICD-10-CM | POA: Insufficient documentation

## 2020-08-15 DIAGNOSIS — I4510 Unspecified right bundle-branch block: Secondary | ICD-10-CM | POA: Insufficient documentation

## 2020-08-15 LAB — PULMONARY FUNCTION TEST
DL/VA % pred: 91 %
DL/VA: 3.68 ml/min/mmHg/L
DLCO cor % pred: 73 %
DLCO cor: 15.09 ml/min/mmHg
DLCO unc % pred: 74 %
DLCO unc: 15.13 ml/min/mmHg
FEF 25-75 Pre: 2.07 L/sec
FEF2575-%Pred-Pre: 125 %
FEV1-%Pred-Pre: 98 %
FEV1-Pre: 1.86 L
FEV1FVC-%Pred-Pre: 108 %
FEV6-%Pred-Pre: 96 %
FEV6-Pre: 2.25 L
FEV6FVC-%Pred-Pre: 104 %
FVC-%Pred-Pre: 92 %
FVC-Pre: 2.25 L
Pre FEV1/FVC ratio: 83 %
Pre FEV6/FVC Ratio: 100 %
RV % pred: 101 %
RV: 2.42 L
TLC % pred: 91 %
TLC: 4.88 L

## 2020-08-15 MED ORDER — ALBUTEROL SULFATE (2.5 MG/3ML) 0.083% IN NEBU
2.5000 mg | INHALATION_SOLUTION | Freq: Once | RESPIRATORY_TRACT | Status: DC
Start: 2020-08-15 — End: 2020-08-15

## 2020-08-15 NOTE — Patient Instructions (Signed)
Please call our office in October 2023 to schedule your pulmonary function test, echocardiogram, and follow up appointment  If you have any questions or concerns before your next appointment please send [place] a message through [place] or call our office at [phone].    TO LEAVE A MESSAGE FOR THE NURSE SELECT OPTION 2, PLEASE LEAVE A MESSAGE INCLUDING: . YOUR NAME . DATE OF BIRTH . CALL BACK NUMBER . REASON FOR CALL**this is important as we prioritize the call backs  YOU WILL RECEIVE A CALL BACK THE SAME DAY AS LONG AS YOU CALL BEFORE 4:00 PM

## 2020-08-15 NOTE — Progress Notes (Signed)
  Echocardiogram 2D Echocardiogram has been performed.  Rhonda [place] 08/15/2020, 2:50 PM

## 2020-08-15 NOTE — Addendum Note (Signed)
Encounter addended by: Scarlette Calico, RN on: 08/15/2020 3:54 PM  Actions taken: Clinical Note Signed

## 2020-08-18 LAB — ECHOCARDIOGRAM COMPLETE
Area-P 1/2: 2.16 cm2
Calc EF: 62.9 %
S' Lateral: 2.7 cm
Single Plane A2C EF: 69.8 %
Single Plane A4C EF: 53.9 %

## 2020-08-20 ENCOUNTER — Telehealth (HOSPITAL_COMMUNITY): Payer: Self-pay

## 2020-08-20 NOTE — Telephone Encounter (Signed)
Malena Edman, RN  08/20/2020 12:57 PM EDT Back to Top    Patient aware and verbalized understanding

## 2020-08-20 NOTE — Telephone Encounter (Signed)
-----   Message from Jolaine Artist, MD sent at 08/18/2020  9:47 PM EDT ----- Normal spirometry. Slightly decreased DLCO

## 2020-09-23 DIAGNOSIS — H47011 Ischemic optic neuropathy, right eye: Secondary | ICD-10-CM | POA: Diagnosis not present

## 2020-09-23 DIAGNOSIS — H40053 Ocular hypertension, bilateral: Secondary | ICD-10-CM | POA: Diagnosis not present

## 2020-09-23 DIAGNOSIS — H0102A Squamous blepharitis right eye, upper and lower eyelids: Secondary | ICD-10-CM | POA: Diagnosis not present

## 2020-09-23 DIAGNOSIS — H40023 Open angle with borderline findings, high risk, bilateral: Secondary | ICD-10-CM | POA: Diagnosis not present

## 2020-09-23 DIAGNOSIS — H532 Diplopia: Secondary | ICD-10-CM | POA: Diagnosis not present

## 2020-10-03 ENCOUNTER — Ambulatory Visit: Payer: Medicare Other | Attending: Internal Medicine

## 2020-10-03 DIAGNOSIS — Z23 Encounter for immunization: Secondary | ICD-10-CM

## 2020-10-03 NOTE — Progress Notes (Signed)
° °  Covid-19 Vaccination Clinic  Name:  Rhonda [place]    MRN: [id number] DOB: [birth year]  10/03/2020  Rhonda Buchanan was observed post Covid-19 immunization for 30 minutes based on pre-vaccination screening without incident. She was provided with Vaccine Information Sheet and instruction to access the V-Safe system.   Rhonda Buchanan was instructed to call 911 with any severe reactions post vaccine:  Difficulty breathing   Swelling of face and throat   A fast heartbeat   A bad rash all over body   Dizziness and weakness   Immunizations Administered    Name Date Dose VIS Date Route   Pfizer COVID-19 Vaccine 10/03/2020  3:21 PM 0.3 mL 08/06/2020 Intramuscular   Manufacturer: [place]   Lot: [id number]   [place]: [id number]

## 2020-10-06 NOTE — Progress Notes (Signed)
Office Visit Note  Patient: Rhonda Buchanan             Date of Birth: [birth year]           MRN: [id number]             PCP: Denita Lung, MD Referring: Denita Lung, MD Visit Date: 10/20/2020 Occupation: @GUAROCC @  Subjective:  Other (Right ankle pain )   History of Present Illness: Rhonda Buchanan is a 74 y.o. female with history of morphea and a started on.  She states she continues to have some Raynaud's symptoms which are manageable.  She denies any history of digital ulcers.  She has not noticed any increase in skin tightness.  She continues to take methotrexate through her dermatologist.  She states she has had some discomfort in her right shoulder which could be related to an activity.  Recently she has been waking up in the middle of the night with the right ankle joint pain.  She states the pain radiates from her ankle all the way to her shin.  Is last for few minutes.  The discomfort happens about 3 times a week.  She has not had any injury.  She does not have any discomfort when she is walking.  She denies any recurrence of iridocyclitis.  She has discomfort in her knee joints when she climbs stairs.  Activities of Daily Living:  Patient reports morning stiffness for a few  minutes.   Patient Reports nocturnal pain.  Difficulty dressing/grooming: Denies Difficulty climbing stairs: Reports Difficulty getting out of chair: Reports Difficulty using hands for taps, buttons, cutlery, and/or writing: Denies  Review of Systems  Constitutional: Negative for fatigue.  HENT: Positive for mouth dryness. Negative for mouth sores and nose dryness.   Eyes: Positive for itching. Negative for pain and dryness.  Respiratory: Negative for shortness of breath and difficulty breathing.   Cardiovascular: Negative for chest pain and palpitations.  Gastrointestinal: Negative for blood in stool, constipation and diarrhea.  Endocrine: Negative for increased urination.  Genitourinary:  Negative for difficulty urinating.  Musculoskeletal: Positive for arthralgias, joint pain and morning stiffness. Negative for joint swelling, myalgias, muscle tenderness and myalgias.  Skin: Negative for color change, rash and redness.  Allergic/Immunologic: Negative for susceptible to infections.  Neurological: Negative for dizziness, numbness, headaches, memory loss and weakness.  Hematological: Negative for bruising/bleeding tendency.  Psychiatric/Behavioral: Negative for confusion.    PMFS History:  Patient Active Problem List   Diagnosis Date Noted  . High risk medication use 04/09/2020  . 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 Atherosclerosis of coronary artery of native heart without angina pectoris 10/05/2016  . Aortic stenosis 05/21/2016  . Abnormal PFTs 05/21/2016  . Asthma due to environmental allergies 06/24/2015  . 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 Scleroderma ([place]) 07/12/2013  . Hypertension 03/04/2011  . Hyperlipidemia 03/04/2011  . Allergic rhinitis 03/04/2011  . Arthritis 03/04/2011  . Migraine variant 04/18/1995    Past Medical History:  Diagnosis Date  . Allergy   . Arthritis   . Hypercholesterolemia   . Hypertension   . PUD (peptic ulcer disease)   . Scleroderma ([place])     Family History  Problem Relation Age of Onset  . Ulcers Brother   . Heart disease Brother 1       MI  . Cancer Father   . Ulcers Sister   . Cancer Maternal Grandmother   .  Heart disease Maternal Grandfather    Past Surgical History:  Procedure Laterality Date  . arthroscopy    . CYST REMOVAL HAND    . EYE SURGERY Left 10/25/2017   laser surgery   . KNEE ARTHROPLASTY     Social History   Social History Narrative  . Not on file   Immunization History  Administered Date(s) Administered  . H1N1 08/22/2009  . Influenza, High Dose Seasonal PF 06/24/2015, 10/05/2016, 08/04/2018  . Influenza,inj,Quad PF,6+ Mos  07/12/2013, 01/15/2015  . Influenza-Unspecified 12/24/2017, 08/04/2018  . PFIZER SARS-COV-2 Vaccination 12/13/2019, 01/08/2020, 10/03/2020  . Pneumococcal Conjugate-13 05/20/2014  . Pneumococcal Polysaccharide-23 01/31/2007  . Tdap 01/31/2007  . Zoster 07/02/2008     Objective: Vital Signs: BP 118/74 (BP Location: Right Arm, Patient Position: Sitting, Cuff Size: Normal)   Pulse 73   Resp 14   Ht 5\' 6"  (1.676 m)   Wt 175 lb 6.4 oz (79.6 kg)   LMP 02/16/2003   BMI 28.31 kg/m    Physical Exam Vitals and nursing note reviewed.  Constitutional:      Appearance: She is well-developed and well-nourished.  HENT:     Head: Normocephalic and atraumatic.  Eyes:     Extraocular Movements: EOM normal.     Conjunctiva/sclera: Conjunctivae normal.     Comments: Left conjunctival injection was noted.  Cardiovascular:     Rate and Rhythm: Normal rate and regular rhythm.     Pulses: Intact distal pulses.     Heart sounds: Normal heart sounds.  Pulmonary:     Effort: Pulmonary effort is normal.     Breath sounds: Normal breath sounds.  Abdominal:     General: Bowel sounds are normal.     Palpations: Abdomen is soft.  Musculoskeletal:     Cervical back: Normal range of motion.  Lymphadenopathy:     Cervical: No cervical adenopathy.  Skin:    General: Skin is warm and dry.     Capillary Refill: Capillary refill takes less than 2 seconds.  Neurological:     Mental Status: She is alert and oriented to person, place, and time.  Psychiatric:        Mood and Affect: Mood and affect normal.        Behavior: Behavior normal.      Musculoskeletal Exam: C-spine thoracic and lumbar spine with good range of motion.  CDAI Exam: CDAI Score: -- Patient Global: --; Provider Global: -- Swollen: --; Tender: -- Joint Exam 10/20/2020   No joint exam has been documented for this visit   There is currently no information documented on the homunculus. Go to the Rheumatology activity and complete  the homunculus joint exam.  Investigation: No additional findings.  Imaging: XR Ankle 2 Views Right  Result Date: 10/20/2020 No tibiotalar or subtalar joint space narrowing was noted.  Posterior calcaneal spur was noted. Impression: Unremarkable x-ray of the ankle joint.   Recent Labs: Lab Results  Component Value Date   WBC 4.8 08/01/2020   HGB 13.5 08/01/2020   PLT 196 08/01/2020   NA 138 08/01/2020   K 3.5 08/01/2020   CL 99 08/01/2020   CO2 28 08/01/2020   GLUCOSE 93 08/01/2020   BUN 14 08/01/2020   CREATININE 1.02 (H) 08/01/2020   BILITOT 0.6 08/01/2020   ALKPHOS 83 08/01/2020   AST 26 08/01/2020   ALT 25 08/01/2020   PROT 7.0 08/01/2020   ALBUMIN 4.4 08/01/2020   CALCIUM 9.9 08/01/2020   GFRAA 62 08/01/2020  Speciality Comments: No specialty comments available.  Procedures:  No procedures performed Allergies: Ibuprofen   Assessment / Plan:     Visit Diagnoses: Scleroderma ([place]) - -RF, -CCP, -ANA, - PanANCA patient gives history of scleroderma.  She has no sclerodactyly, telangiectasias, gastroesophageal reflux or calcinosis.  She does have Raynaud's phenomenon.  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   High risk medication use - methotrexate 6 tablets by mouth once weekly and folic acid 2 mg po daily.  Prescribed by the dermatologist.  She may benefit by increasing the dose of methotrexate as she states she continues to have some iridocyclitis.  Pain in joint of right ankle -patient complains of episodic pain in her right ankle joint which occurs only in the middle of the night and severe which last for few minutes and then resolves.  She has no difficulty walking.  She has no warmth or swelling on palpation of right ankle joint.  Plan: XR Ankle 2 Views Right.  X-ray of the ankle joint was unremarkable.  Posterior calcaneal spur was noted.  X-ray findings were discussed with the patient.  I will check uric acid level today.   She is on thiazides.  Contact her when the uric acid results are available.  Recurrent acute iridocyclitis of both eyes-she had mild conjunctival injection.  She states she is followed by an ophthalmologist.  She was using prednisone eyedrops.  Raynaud's syndrome without gangrene-she does not have active Raynauds.  Primary osteoarthritis of both knees-she has some discomfort climbing stairs.  She had no warmth or swelling on examination.  Other medical problems are listed as follows:  Positive PPD, treated  Bilateral calcaneal spurs  History of hyperlipidemia  History of migraine  History of hypertension  Orders: Orders Placed This Encounter  Procedures  . XR Ankle 2 Views Right  . Uric acid   No orders of the defined types were placed in this encounter.    Follow-Up Instructions: Return in about 6 months (around 04/19/2021) for Scleroderma, Osteoarthritis.   Bo Merino, MD  Note - This record has been created using Editor, commissioning.  Chart creation errors have been sought, but may not always  have been located. Such creation errors do not reflect on  the standard of medical care.

## 2020-10-20 ENCOUNTER — Ambulatory Visit: Payer: Medicare Other | Admitting: Rheumatology

## 2020-10-20 ENCOUNTER — Ambulatory Visit: Payer: Self-pay

## 2020-10-20 ENCOUNTER — Other Ambulatory Visit: Payer: Self-pay

## 2020-10-20 ENCOUNTER — Encounter: Payer: Self-pay | Admitting: Rheumatology

## 2020-10-20 VITALS — BP 118/74 | HR 73 | Resp 14 | Ht 66.0 in | Wt 175.4 lb

## 2020-10-20 DIAGNOSIS — Z8669 Personal history of other diseases of the nervous system and sense organs: Secondary | ICD-10-CM

## 2020-10-20 DIAGNOSIS — M349 Systemic sclerosis, unspecified: Secondary | ICD-10-CM | POA: Diagnosis not present

## 2020-10-20 DIAGNOSIS — H20023 Recurrent acute iridocyclitis, bilateral: Secondary | ICD-10-CM

## 2020-10-20 DIAGNOSIS — I7300 Raynaud's syndrome without gangrene: Secondary | ICD-10-CM | POA: Diagnosis not present

## 2020-10-20 DIAGNOSIS — Z79899 Other long term (current) drug therapy: Secondary | ICD-10-CM | POA: Diagnosis not present

## 2020-10-20 DIAGNOSIS — Z8639 Personal history of other endocrine, nutritional and metabolic disease: Secondary | ICD-10-CM

## 2020-10-20 DIAGNOSIS — M7732 Calcaneal spur, left foot: Secondary | ICD-10-CM

## 2020-10-20 DIAGNOSIS — Z8679 Personal history of other diseases of the circulatory system: Secondary | ICD-10-CM | POA: Diagnosis not present

## 2020-10-20 DIAGNOSIS — M25571 Pain in right ankle and joints of right foot: Secondary | ICD-10-CM | POA: Diagnosis not present

## 2020-10-20 DIAGNOSIS — R7611 Nonspecific reaction to tuberculin skin test without active tuberculosis: Secondary | ICD-10-CM

## 2020-10-20 DIAGNOSIS — M170 Bilateral primary osteoarthritis of knee: Secondary | ICD-10-CM

## 2020-10-20 DIAGNOSIS — L940 Localized scleroderma [morphea]: Secondary | ICD-10-CM | POA: Diagnosis not present

## 2020-10-20 DIAGNOSIS — M7731 Calcaneal spur, right foot: Secondary | ICD-10-CM | POA: Diagnosis not present

## 2020-10-21 LAB — URIC ACID: Uric Acid, Serum: 4.5 mg/dL (ref 2.5–7.0)

## 2020-10-21 NOTE — Progress Notes (Signed)
Uric acid is normal and does not indicate gout.

## 2020-11-10 DIAGNOSIS — H209 Unspecified iridocyclitis: Secondary | ICD-10-CM | POA: Diagnosis not present

## 2020-11-10 DIAGNOSIS — Z79899 Other long term (current) drug therapy: Secondary | ICD-10-CM | POA: Diagnosis not present

## 2020-11-10 DIAGNOSIS — L940 Localized scleroderma [morphea]: Secondary | ICD-10-CM | POA: Diagnosis not present

## 2020-11-21 DIAGNOSIS — Z79899 Other long term (current) drug therapy: Secondary | ICD-10-CM | POA: Diagnosis not present

## 2020-12-04 ENCOUNTER — Telehealth: Payer: Self-pay

## 2020-12-04 NOTE — Telephone Encounter (Signed)
Pt. Called wanted to know which Podiatrist would you recommend for her.

## 2020-12-04 NOTE — Telephone Encounter (Signed)
Any of them on the list

## 2020-12-05 NOTE — Telephone Encounter (Signed)
Pt. Aware of podiatrist recommendations.

## 2020-12-17 ENCOUNTER — Other Ambulatory Visit: Payer: Self-pay

## 2020-12-17 ENCOUNTER — Ambulatory Visit: Payer: Medicare Other | Admitting: Podiatry

## 2020-12-17 ENCOUNTER — Encounter: Payer: Self-pay | Admitting: Podiatry

## 2020-12-17 DIAGNOSIS — L600 Ingrowing nail: Secondary | ICD-10-CM | POA: Diagnosis not present

## 2020-12-17 NOTE — Patient Instructions (Signed)

## 2020-12-22 NOTE — Progress Notes (Signed)
Subjective:   Patient ID: Rhonda Buchanan, female   DOB: 75 y.o.   MRN: [id number]   HPI Patient presents stating she has a chronically painful right big toenail and states that she has tried to trim it and soak it and use topicals without relief.  Patient states is been ongoing and getting more discomforting and does not smoke likes to be active   Review of Systems  All other systems reviewed and are negative.       Objective:  Physical Exam Vitals and nursing note reviewed.  Constitutional:      Appearance: She is well-developed and well-nourished.  Cardiovascular:     Pulses: Intact distal pulses.  Pulmonary:     Effort: Pulmonary effort is normal.  Musculoskeletal:        General: Normal range of motion.  Skin:    General: Skin is warm.  Neurological:     Mental Status: She is alert.     Neurovascular status found to be intact muscle strength was found to be adequate range of motion was adequate.  Good digital perfusion noted she is well oriented with an incurvated right hallux medial border painful when pressed making shoe gear difficult with no active drainage no redness noted associated with it but painful     Assessment:  Chronic ingrown toenail deformity right hallux with no current indications of infection     Plan:  H&P reviewed condition recommended removal of the border explained procedure risk and she signed consent form understanding risk.  Today I infiltrated 60 mg like Marcaine mixture sterile prep done and using sterile instrumentation remove border exposed matrix applied phenol 3 applications 30 seconds followed by alcohol lavage sterile dressing gave instructions on soaks and reappoint to recheck.  Encouraged to call with questions and leave dressing on 24 hours but take off earlier if needed

## 2021-04-02 DIAGNOSIS — H04123 Dry eye syndrome of bilateral lacrimal glands: Secondary | ICD-10-CM | POA: Diagnosis not present

## 2021-04-02 DIAGNOSIS — H40053 Ocular hypertension, bilateral: Secondary | ICD-10-CM | POA: Diagnosis not present

## 2021-04-02 DIAGNOSIS — H40023 Open angle with borderline findings, high risk, bilateral: Secondary | ICD-10-CM | POA: Diagnosis not present

## 2021-04-02 DIAGNOSIS — H2013 Chronic iridocyclitis, bilateral: Secondary | ICD-10-CM | POA: Diagnosis not present

## 2021-04-12 ENCOUNTER — Other Ambulatory Visit: Payer: Self-pay | Admitting: Family Medicine

## 2021-04-12 DIAGNOSIS — I2510 Atherosclerotic heart disease of native coronary artery without angina pectoris: Secondary | ICD-10-CM

## 2021-04-12 DIAGNOSIS — I10 Essential (primary) hypertension: Secondary | ICD-10-CM

## 2021-04-12 DIAGNOSIS — E785 Hyperlipidemia, unspecified: Secondary | ICD-10-CM

## 2021-04-15 ENCOUNTER — Ambulatory Visit (INDEPENDENT_AMBULATORY_CARE_PROVIDER_SITE_OTHER): Payer: Medicare Other | Admitting: Family Medicine

## 2021-04-15 ENCOUNTER — Other Ambulatory Visit: Payer: Self-pay

## 2021-04-15 VITALS — BP 110/68 | HR 68 | Temp 96.6°F | Ht 65.0 in | Wt 175.0 lb

## 2021-04-15 DIAGNOSIS — E785 Hyperlipidemia, unspecified: Secondary | ICD-10-CM

## 2021-04-15 DIAGNOSIS — I700 Atherosclerosis of aorta: Secondary | ICD-10-CM | POA: Insufficient documentation

## 2021-04-15 DIAGNOSIS — M1990 Unspecified osteoarthritis, unspecified site: Secondary | ICD-10-CM | POA: Diagnosis not present

## 2021-04-15 DIAGNOSIS — I10 Essential (primary) hypertension: Secondary | ICD-10-CM | POA: Diagnosis not present

## 2021-04-15 DIAGNOSIS — G43809 Other migraine, not intractable, without status migrainosus: Secondary | ICD-10-CM

## 2021-04-15 DIAGNOSIS — R7309 Other abnormal glucose: Secondary | ICD-10-CM | POA: Diagnosis not present

## 2021-04-15 DIAGNOSIS — J301 Allergic rhinitis due to pollen: Secondary | ICD-10-CM

## 2021-04-15 DIAGNOSIS — G4710 Hypersomnia, unspecified: Secondary | ICD-10-CM | POA: Diagnosis not present

## 2021-04-15 DIAGNOSIS — I350 Nonrheumatic aortic (valve) stenosis: Secondary | ICD-10-CM | POA: Diagnosis not present

## 2021-04-15 DIAGNOSIS — M349 Systemic sclerosis, unspecified: Secondary | ICD-10-CM

## 2021-04-15 DIAGNOSIS — Z79899 Other long term (current) drug therapy: Secondary | ICD-10-CM | POA: Diagnosis not present

## 2021-04-15 DIAGNOSIS — J45909 Unspecified asthma, uncomplicated: Secondary | ICD-10-CM

## 2021-04-15 DIAGNOSIS — Z0000 Encounter for general adult medical examination without abnormal findings: Secondary | ICD-10-CM | POA: Diagnosis not present

## 2021-04-15 DIAGNOSIS — I2510 Atherosclerotic heart disease of native coronary artery without angina pectoris: Secondary | ICD-10-CM | POA: Diagnosis not present

## 2021-04-15 DIAGNOSIS — Z13220 Encounter for screening for lipoid disorders: Secondary | ICD-10-CM | POA: Diagnosis not present

## 2021-04-15 MED ORDER — ROSUVASTATIN CALCIUM 40 MG PO TABS: 40.0000 mg | ORAL_TABLET | Freq: Every day | ORAL | 3 refills | Status: DC

## 2021-04-15 MED ORDER — LISINOPRIL-HYDROCHLOROTHIAZIDE 10-12.5 MG PO TABS: 1.0000 | ORAL_TABLET | Freq: Every day | ORAL | 3 refills | Status: DC

## 2021-04-15 MED ORDER — METOPROLOL SUCCINATE ER 50 MG PO TB24: ORAL_TABLET | ORAL | 3 refills | Status: DC

## 2021-04-15 NOTE — Progress Notes (Signed)
Office Visit Note  Patient: Rhonda Buchanan             Date of Birth: 28-Sep-1946           MRN: [id number]             PCP: Denita Lung, MD Referring: Denita Lung, MD Visit Date: 04/28/2021 Occupation: @GUAROCC @  Subjective:  Pain in both hands.   History of Present Illness: Rhonda Buchanan is a 75 y.o. female  with the history of scleroderma and iridocyclitis.  She denies any recent lesions of morphea.  She has been having increased pain and discomfort in her hands.  She states she continues to have frequent flares of iridocyclitis for which she continues to take the prednisone eyedrops.  She has not [place] increased risk and tightness.  She states Raynauds is currently not active.  Activities of Daily Living:  Patient reports morning stiffness for 1-5 minutes.   Patient Denies nocturnal pain.  Difficulty dressing/grooming: Denies Difficulty climbing stairs: Reports Difficulty getting out of chair: Reports Difficulty using hands for taps, buttons, cutlery, and/or writing: Reports  Review of Systems  Constitutional:  Positive for fatigue.  HENT:  Negative for mouth sores, mouth dryness and nose dryness.   Eyes:  Negative for pain, itching, visual disturbance and dryness.  Respiratory:  Negative for cough, hemoptysis, shortness of breath and difficulty breathing.   Cardiovascular:  Positive for swelling in legs/feet. Negative for chest pain and palpitations.  Gastrointestinal:  Negative for abdominal pain, blood in stool, constipation and diarrhea.  Endocrine: Negative for increased urination.  Genitourinary:  Negative for painful urination.  Musculoskeletal:  Positive for joint pain, joint pain, joint swelling and morning stiffness. Negative for myalgias, muscle weakness, muscle tenderness and myalgias.  Skin:  Negative for color change, rash and redness.  Allergic/Immunologic: Negative for susceptible to infections.  Neurological:  Positive for headaches. Negative  for dizziness, numbness, memory loss and weakness.  Hematological:  Negative for swollen glands.  Psychiatric/Behavioral:  Negative for confusion and sleep disturbance.    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/14/2014   Scleroderma ([place]) 07/12/2013   Hypertension 03/04/2011   Hyperlipidemia 03/04/2011   Allergic rhinitis 03/04/2011   Arthritis 03/04/2011   Migraine variant 04/18/1995    Past Medical History:  Diagnosis Date   Allergy    Arthritis    Hypercholesterolemia    Hypertension    PUD (peptic ulcer disease)    Scleroderma ([place])     Family History  Problem Relation Age of Onset   Ulcers Brother    Heart disease Brother 51       MI   Cancer Father    Ulcers Sister    Cancer Maternal Grandmother    Heart disease Maternal Grandfather    Past Surgical History:  Procedure Laterality Date   arthroscopy     CYST REMOVAL HAND     EYE SURGERY Left 10/25/2017   laser surgery    KNEE ARTHROPLASTY     Social History   Social History Narrative   Not on file  Immunization History  Administered Date(s) Administered   Fluad Quad(high Dose 65+) 12/19/2020   H1N1 08/22/2009   Influenza, High Dose Seasonal PF 06/24/2015, 10/05/2016, 08/04/2018   Influenza,inj,Quad PF,6+ Mos 07/12/2013, 01/15/2015   Influenza-Unspecified 06/24/2015, 10/05/2016, 12/24/2017, 08/04/2018   PFIZER([place])SARS-COV-2 Vaccination 12/13/2019, 01/08/2020, 10/03/2020   Pneumococcal Conjugate-13 05/20/2014   Pneumococcal Polysaccharide-23 01/31/2007   Tdap 01/31/2007,  12/19/2020   Zoster, Live 07/02/2008     Objective: Vital Signs: BP 120/73 (BP Location: Right Arm, Patient Position: Sitting, Cuff Size: Normal)   Pulse 71   Ht 5\' 5"  (1.651 m)   Wt 175 lb (79.4 kg)   LMP 02/16/2003   BMI 29.12 kg/m    Physical Exam Vitals and nursing note reviewed.  Constitutional:      Appearance: She is well-developed.  HENT:     Head: Normocephalic and atraumatic.  Eyes:     Conjunctiva/sclera: Conjunctivae normal.  Cardiovascular:     Rate and Rhythm: Normal rate and regular rhythm.     Heart sounds: Normal heart sounds.  Pulmonary:     Effort: Pulmonary effort is normal.     Breath sounds: Normal breath sounds.  Abdominal:     General: Bowel sounds are normal.     Palpations: Abdomen is soft.  Musculoskeletal:     Cervical back: Normal range of motion.  Lymphadenopathy:     Cervical: No cervical adenopathy.  Skin:    General: Skin is warm and dry.     Capillary Refill: Capillary refill takes less than 2 seconds.  Neurological:     Mental Status: She is alert and oriented to person, place, and time.  Psychiatric:        Behavior: Behavior normal.     Musculoskeletal Exam: C-spine was in good range of motion.  Shoulder joints, elbow joints, wrist joints with good range of motion.  She had bilateral CMC thickening.  PIP and DIP thickening was noted.  No synovitis was noted.  Hip joints and knee joints in good range of motion.  There was no tenderness over ankles MTPs or PIPs.  CDAI Exam: CDAI Score: -- Patient Global: --; Provider Global: -- Swollen: --; Tender: -- Joint Exam 04/28/2021   No joint exam has been documented for this visit   There is currently no information documented on the homunculus. Go to the Rheumatology activity and complete the homunculus joint exam.  Investigation: No additional findings.  Imaging: No results found.  Recent Labs: Lab Results  Component Value Date   WBC 3.2 (L) 04/15/2021   HGB 12.5 04/15/2021    PLT 232 04/15/2021   NA 138 04/15/2021   K 3.3 (L) 04/15/2021   CL 103 04/15/2021   CO2 24 04/15/2021   GLUCOSE 112 (H) 04/15/2021   BUN 14 04/15/2021   CREATININE 1.19 (H) 04/15/2021   BILITOT 0.6 04/15/2021   ALKPHOS 72 04/15/2021   AST 34 04/15/2021   ALT 33 (H) 04/15/2021   PROT 6.7 04/15/2021   ALBUMIN 4.3 04/15/2021   CALCIUM 9.9 04/15/2021   GFRAA 62 08/01/2020    Speciality Comments: No specialty comments available.  Procedures:  No procedures performed Allergies: Ibuprofen   Assessment / Plan:     Visit Diagnoses: Scleroderma ([place]) - -RF, -CCP, -ANA, - PanANCA patient gives history of Morphea, Raynauds and recurrent iridocyclitis.  She does not have any sclerodactyly, telangiectasias, nailbed capillary changes, esophagitis or dysphagia.  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.  She has been  prescribed methotrexate for morphea.  High risk medication use - methotrexate 6 tablets by mouth once weekly and folic acid 2 mg po daily.  Prescribed by the dermatologist.  Her creatinine is elevated and white cell count is low.  I advised patient to discuss with her dermatologist if her methotrexate dose could be reduced or if they could be any alternative treatment due to elevated creatinine and neutropenia.  Pain in joint of right ankle - X-ray of the ankle joint was unremarkable.  Posterior calcaneal spur was noted.  Recurrent acute iridocyclitis of both eyes-patient states she has been having recurrent flares and she is unable to get off the prednisone eyedrops.  Per her request I will refer her to Dr. Manuella Ghazi.  Raynaud's syndrome without gangrene-currently not active.  Primary osteoarthritis of both knees-she is off-and-on discomfort.  Positive PPD, treated  Bilateral calcaneal spurs  History of migraine  History of hypertension  History of hyperlipidemia  Orders: Orders Placed This Encounter  Procedures   Ambulatory referral  to Ophthalmology    No orders of the defined types were placed in this encounter.    Follow-Up Instructions: Return in about 3 months (around 07/29/2021) for Scleroderma.   Bo Merino, MD  Note - This record has been created using Editor, commissioning.  Chart creation errors have been sought, but may not always  have been located. Such creation errors do not reflect on  the standard of medical care.

## 2021-04-15 NOTE — Progress Notes (Signed)
Rhonda Buchanan is a 75 y.o. female who presents for annual wellness visit,CPE and follow-up on chronic medical conditions.  She continues on her blood pressure medications as well as lipid and having no difficulty.  Her allergies seem to be under good control.  Migraine is causing no difficulties.  She does have arthritis as well as scleroderma and does follow-up with rheumatology regularly.  Her asthma causes very little difficulty only when she is in an outside environment and rarely uses albuterol.  She follows up regularly with cardiology concerning her aortic stenosis.  She also has aortic atherosclerosis as well as coronary disease.  She does complain of hypersomnia and was told in the past that she needed a sleep study by cardiology but was unable to accomplish this due to [place].  Immunizations and Health Maintenance Immunization History  Administered Date(s) Administered   H1N1 08/22/2009   Influenza, High Dose Seasonal PF 06/24/2015, 10/05/2016, 08/04/2018   Influenza,inj,Quad PF,6+ Mos 07/12/2013, 01/15/2015   Influenza-Unspecified 12/24/2017, 08/04/2018   PFIZER(Purple Top)SARS-COV-2 Vaccination 12/13/2019, 01/08/2020, 10/03/2020   Pneumococcal Conjugate-13 05/20/2014   Pneumococcal Polysaccharide-23 01/31/2007   Tdap 01/31/2007   Zoster, Live 07/02/2008   Health Maintenance Due  Topic Date Due   Zoster Vaccines- Shingrix (1 of 2) Never done   COVID-19 Vaccine (4 - Booster for Pfizer series) 01/01/2021    Last Pap smear: aged put Last mammogram: 07/21/20 Last colonoscopy: 07/17/2008/ cologuard 05/13/20 Last DEXA: 04/04/2013 Dentist: over one year Ophtho: Q six months Exercise: N/A  Other doctors caring for patient include: Dr. Paulla Dolly podiatry, Dr. Estanislado Pandy rheumatology, Dr. Haroldine Laws cardiology  Advanced directives: Does Patient Have a Medical Advance Directive?: No Does patient want to make changes to medical advance directive?: Yes (ED - Information included in  AVS) Would patient like information on creating a medical advance directive?: Yes (ED - Information included in AVS)  Depression screen:  See questionnaire below.  Depression screen [facility] 2/9 04/15/2021 04/14/2020 12/21/2018 10/19/2017 10/05/2016  Decreased Interest 0 0 3 0 0  Down, Depressed, Hopeless 0 0 1 0 0  PHQ - 2 Score 0 0 4 0 0  Altered sleeping - - 3 - -  Tired, decreased energy - - 3 - -  Change in appetite - - 0 - -  Feeling bad or failure about yourself  - - 0 - -  Trouble concentrating - - 3 - -  Moving slowly or fidgety/restless - - 0 - -  Suicidal thoughts - - 0 - -  PHQ-9 Score - - 13 - -  Difficult doing work/chores - - Somewhat difficult - -    Fall Risk Screen: see questionnaire below. Fall Risk  04/15/2021 04/14/2020 12/21/2018 10/19/2017 10/05/2016  Falls in the past year? 0 0 0 No Yes  Number falls in past yr: 0 - - - 1  Injury with Fall? 0 - - - No  Risk for fall due to : No Fall Risks - - - Other (Comment)  Risk for fall due to: Comment - - - - she had vertigo  Follow up Falls evaluation completed - - - -    ADL screen:  See questionnaire below Functional Status Survey: Is the patient deaf or have difficulty hearing?: No Does the patient have difficulty seeing, even when wearing glasses/contacts?: No Does the patient have difficulty concentrating, remembering, or making decisions?: No Does the patient have difficulty walking or climbing stairs?: Yes Does the patient have difficulty dressing or bathing?: No Does the patient  have difficulty doing errands alone such as visiting a doctor's office or shopping?: No   Review of Systems Constitutional: -, -unexpected weight change, -anorexia, -fatigue Allergy: -sneezing, -itching, -congestion Dermatology: denies changing moles, rash, lumps ENT: -runny nose, -ear pain, -sore throat,  Cardiology:  -chest pain, -palpitations, -orthopnea, Respiratory: -cough, -shortness of breath, -dyspnea on exertion, -wheezing,   Gastroenterology: -abdominal pain, -nausea, -vomiting, -diarrhea, -constipation, -dysphagia Hematology: -bleeding or bruising problems Musculoskeletal: -arthralgias, -myalgias, -joint swelling, -back pain, - Ophthalmology: -vision changes,  Urology: -dysuria, -difficulty urinating,  -urinary frequency, -urgency, incontinence Neurology: -, -numbness, , -memory loss, -falls, -dizziness    PHYSICAL EXAM:  BP 110/68   Pulse 68   Temp (!) 96.6 F (35.9 C)   Ht 5\' 5"  (1.651 m)   Wt 175 lb (79.4 kg)   LMP 02/16/2003   SpO2 98%   BMI 29.12 kg/m   General Appearance: Alert, cooperative, no distress, appears stated age Head: Normocephalic, without obvious abnormality, atraumatic Eyes: PERRL, conjunctiva/corneas clear, EOM's intact,  Ears: Normal TM's and external ear canals Nose: Nares normal, mucosa normal, no drainage or sinus tenderness Throat: Lips, mucosa, and tongue normal; teeth and gums normal Neck: Supple, no lymphadenopathy;  thyroid:  no enlargement/tenderness/nodules; no carotid bruit or JVD Lungs: Clear to auscultation bilaterally without wheezes, rales or ronchi; respirations unlabored Heart: Regular rate and rhythm, S1 and S2 normal, no murmur, rubor gallop Abdomen: Soft, non-tender, nondistended, normoactive bowel sounds,  no masses, no hepatosplenomegaly Extremities: No clubbing, cyanosis or edema Pulses: 2+ and symmetric all extremities Skin:  Skin color, texture, turgor normal, no rashes or lesions Lymph nodes: Cervical, supraclavicular, and axillary nodes normal Neurologic:  CNII-XII intact, normal strength, sensation and gait; reflexes 2+ and symmetric throughout Psych: Normal mood, affect, hygiene and grooming.  ASSESSMENT/PLAN: Routine general medical examination at a health care facility - Plan: CBC with Differential/Platelet, Comprehensive metabolic panel, Lipid panel  Scleroderma ([place]) - Plan: CBC with Differential/Platelet, Comprehensive metabolic panel,  Lipid panel  High risk medication use  Arthritis  Allergic rhinitis due to pollen, unspecified seasonality  Nonrheumatic aortic valve stenosis  Asthma due to environmental allergies  Migraine variant  Hyperlipidemia, unspecified hyperlipidemia type - Plan: rosuvastatin (CRESTOR) 40 MG tablet  Aortic atherosclerosis (HCC)  Atherosclerosis of coronary artery of native heart without angina pectoris, unspecified vessel or lesion type - Plan: rosuvastatin (CRESTOR) 40 MG tablet  Essential hypertension - Plan: metoprolol succinate (TOPROL-XL) 50 MG 24 hr tablet, lisinopril-hydrochlorothiazide (ZESTORETIC) 10-12.5 MG tablet  Hypersomnia - Plan: Home sleep test She will continue on the present medication regimens.  No other further evaluation for the migraine needed.  Allergies seem to be under good control.  We will definitely follow-up on the possibility of sleep apnea in regard to some of her problems.    Immunization recommendations discussed.  Colonoscopy recommendations reviewed   Medicare Attestation I have personally reviewed: The patient's medical and social history Their use of alcohol, tobacco or illicit drugs Their current medications and supplements The patient's functional ability including ADLs,fall risks, home safety risks, cognitive, and hearing and visual impairment Diet and physical activities Evidence for depression or mood disorders  The patient's weight, height, and BMI have been recorded in the chart.  I have made referrals, counseling, and provided education to the patient based on review of the above and I have provided the patient with a written personalized care plan for preventive services.     Jill Alexanders, MD   04/15/2021

## 2021-04-16 LAB — COMPREHENSIVE METABOLIC PANEL
ALT: 33 IU/L — ABNORMAL HIGH (ref 0–32)
AST: 34 IU/L (ref 0–40)
Albumin/Globulin Ratio: 1.8 (ref 1.2–2.2)
Albumin: 4.3 g/dL (ref 3.7–4.7)
Alkaline Phosphatase: 72 IU/L (ref 44–121)
BUN/Creatinine Ratio: 12 (ref 12–28)
BUN: 14 mg/dL (ref 8–27)
Bilirubin Total: 0.6 mg/dL (ref 0.0–1.2)
CO2: 24 mmol/L (ref 20–29)
Calcium: 9.9 mg/dL (ref 8.7–10.3)
Chloride: 103 mmol/L (ref 96–106)
Creatinine, Ser: 1.19 mg/dL — ABNORMAL HIGH (ref 0.57–1.00)
Globulin, Total: 2.4 g/dL (ref 1.5–4.5)
Glucose: 112 mg/dL — ABNORMAL HIGH (ref 65–99)
Potassium: 3.3 mmol/L — ABNORMAL LOW (ref 3.5–5.2)
Sodium: 138 mmol/L (ref 134–144)
Total Protein: 6.7 g/dL (ref 6.0–8.5)
eGFR: 47 mL/min/{1.73_m2} — ABNORMAL LOW (ref >59)

## 2021-04-16 LAB — CBC WITH DIFFERENTIAL/PLATELET
Basophils Absolute: 0 10*3/uL (ref 0.0–0.2)
Basos: 1 %
EOS (ABSOLUTE): 0.1 10*3/uL (ref 0.0–0.4)
Eos: 3 %
Hematocrit: 36.6 % (ref 34.0–46.6)
Hemoglobin: 12.5 g/dL (ref 11.1–15.9)
Immature Grans (Abs): 0 10*3/uL (ref 0.0–0.1)
Immature Granulocytes: 0 %
Lymphocytes Absolute: 1.6 10*3/uL (ref 0.7–3.1)
Lymphs: 51 %
MCH: 31.6 pg (ref 26.6–33.0)
MCHC: 34.2 g/dL (ref 31.5–35.7)
MCV: 93 fL (ref 79–97)
Monocytes Absolute: 0.2 10*3/uL (ref 0.1–0.9)
Monocytes: 6 %
Neutrophils Absolute: 1.3 10*3/uL — ABNORMAL LOW (ref 1.4–7.0)
Neutrophils: 39 %
Platelets: 232 10*3/uL (ref 150–450)
RBC: 3.95 x10E6/uL (ref 3.77–5.28)
RDW: 14.1 % (ref 11.7–15.4)
WBC: 3.2 10*3/uL — ABNORMAL LOW (ref 3.4–10.8)

## 2021-04-16 LAB — LIPID PANEL
Chol/HDL Ratio: 3.5 ratio (ref 0.0–4.4)
Cholesterol, Total: 201 mg/dL — ABNORMAL HIGH (ref 100–199)
HDL: 58 mg/dL (ref >39)
LDL Chol Calc (NIH): 125 mg/dL — ABNORMAL HIGH (ref 0–99)
Triglycerides: 100 mg/dL (ref 0–149)
VLDL Cholesterol Cal: 18 mg/dL (ref 5–40)

## 2021-04-22 LAB — HGB A1C W/O EAG: Hgb A1c MFr Bld: 5.4 % (ref 4.8–5.6)

## 2021-04-22 LAB — SPECIMEN STATUS REPORT

## 2021-04-28 ENCOUNTER — Ambulatory Visit: Payer: Medicare Other | Admitting: Rheumatology

## 2021-04-28 ENCOUNTER — Encounter: Payer: Self-pay | Admitting: Rheumatology

## 2021-04-28 ENCOUNTER — Other Ambulatory Visit: Payer: Self-pay

## 2021-04-28 VITALS — BP 120/73 | HR 71 | Ht 65.0 in | Wt 175.0 lb

## 2021-04-28 DIAGNOSIS — M7731 Calcaneal spur, right foot: Secondary | ICD-10-CM | POA: Diagnosis not present

## 2021-04-28 DIAGNOSIS — M25571 Pain in right ankle and joints of right foot: Secondary | ICD-10-CM | POA: Diagnosis not present

## 2021-04-28 DIAGNOSIS — M170 Bilateral primary osteoarthritis of knee: Secondary | ICD-10-CM

## 2021-04-28 DIAGNOSIS — M349 Systemic sclerosis, unspecified: Secondary | ICD-10-CM | POA: Diagnosis not present

## 2021-04-28 DIAGNOSIS — H20023 Recurrent acute iridocyclitis, bilateral: Secondary | ICD-10-CM

## 2021-04-28 DIAGNOSIS — M7732 Calcaneal spur, left foot: Secondary | ICD-10-CM

## 2021-04-28 DIAGNOSIS — Z8679 Personal history of other diseases of the circulatory system: Secondary | ICD-10-CM | POA: Diagnosis not present

## 2021-04-28 DIAGNOSIS — Z8639 Personal history of other endocrine, nutritional and metabolic disease: Secondary | ICD-10-CM

## 2021-04-28 DIAGNOSIS — Z79899 Other long term (current) drug therapy: Secondary | ICD-10-CM

## 2021-04-28 DIAGNOSIS — I7300 Raynaud's syndrome without gangrene: Secondary | ICD-10-CM | POA: Diagnosis not present

## 2021-04-28 DIAGNOSIS — L940 Localized scleroderma [morphea]: Secondary | ICD-10-CM

## 2021-04-28 DIAGNOSIS — M19041 Primary osteoarthritis, right hand: Secondary | ICD-10-CM

## 2021-04-28 DIAGNOSIS — M19042 Primary osteoarthritis, left hand: Secondary | ICD-10-CM

## 2021-04-28 DIAGNOSIS — Z8669 Personal history of other diseases of the nervous system and sense organs: Secondary | ICD-10-CM

## 2021-04-28 DIAGNOSIS — R7611 Nonspecific reaction to tuberculin skin test without active tuberculosis: Secondary | ICD-10-CM

## 2021-04-28 NOTE — Patient Instructions (Signed)
Hand Exercises Hand exercises can be helpful for almost anyone. These exercises can strengthen the hands, improve flexibility and movement, and increase blood flow to the hands. These results can make work and daily tasks easier. Hand exercises can be especially helpful for people who have joint pain from arthritis or have nerve damage from overuse (carpal tunnel syndrome). These exercises can also help people who have injured a hand. Exercises Most of these hand exercises are gentle stretching and motion exercises. It is usually safe to do them often throughout the day. Warming up your hands before exercise may help to reduce stiffness. You can do this with gentle massage orby placing your hands in warm water for 10-15 minutes. It is normal to feel some stretching, pulling, tightness, or mild discomfort as you begin new exercises. This will gradually improve. Stop an exercise right away if you feel sudden, severe pain or your pain gets worse. Ask your healthcare provider which exercises are best for you. Knuckle bend or "claw" fist Stand or sit with your arm, hand, and all five fingers pointed straight up. Make sure to keep your wrist straight during the exercise. Gently bend your fingers down toward your palm until the tips of your fingers are touching the top of your palm. Keep your big knuckle straight and just bend the small knuckles in your fingers. Hold this position for __________ seconds. Straighten (extend) your fingers back to the starting position. Repeat this exercise 5-10 times with each hand. Full finger fist Stand or sit with your arm, hand, and all five fingers pointed straight up. Make sure to keep your wrist straight during the exercise. Gently bend your fingers into your palm until the tips of your fingers are touching the middle of your palm. Hold this position for __________ seconds. Extend your fingers back to the starting position, stretching every joint fully. Repeat this  exercise 5-10 times with each hand. Straight fist Stand or sit with your arm, hand, and all five fingers pointed straight up. Make sure to keep your wrist straight during the exercise. Gently bend your fingers at the big knuckle, where your fingers meet your hand, and the middle knuckle. Keep the knuckle at the tips of your fingers straight and try to touch the bottom of your palm. Hold this position for __________ seconds. Extend your fingers back to the starting position, stretching every joint fully. Repeat this exercise 5-10 times with each hand. Tabletop Stand or sit with your arm, hand, and all five fingers pointed straight up. Make sure to keep your wrist straight during the exercise. Gently bend your fingers at the big knuckle, where your fingers meet your hand, as far down as you can while keeping the small knuckles in your fingers straight. Think of forming a tabletop with your fingers. Hold this position for __________ seconds. Extend your fingers back to the starting position, stretching every joint fully. Repeat this exercise 5-10 times with each hand. Finger spread Place your hand flat on a table with your palm facing down. Make sure your wrist stays straight as you do this exercise. Spread your fingers and thumb apart from each other as far as you can until you feel a gentle stretch. Hold this position for __________ seconds. Bring your fingers and thumb tight together again. Hold this position for __________ seconds. Repeat this exercise 5-10 times with each hand. Making circles Stand or sit with your arm, hand, and all five fingers pointed straight up. Make sure to keep your   wrist straight during the exercise. Make a circle by touching the tip of your thumb to the tip of your index finger. Hold for __________ seconds. Then open your hand wide. Repeat this motion with your thumb and each finger on your hand. Repeat this exercise 5-10 times with each hand. Thumb motion Sit  with your forearm resting on a table and your wrist straight. Your thumb should be facing up toward the ceiling. Keep your fingers relaxed as you move your thumb. Lift your thumb up as high as you can toward the ceiling. Hold for __________ seconds. Bend your thumb across your palm as far as you can, reaching the tip of your thumb for the small finger (pinkie) side of your palm. Hold for __________ seconds. Repeat this exercise 5-10 times with each hand. Grip strengthening  Hold a stress ball or other soft ball in the middle of your hand. Slowly increase the pressure, squeezing the ball as much as you can without causing pain. Think of bringing the tips of your fingers into the middle of your palm. All of your finger joints should bend when doing this exercise. Hold your squeeze for __________ seconds, then relax. Repeat this exercise 5-10 times with each hand. Contact a health care provider if: Your hand pain or discomfort gets much worse when you do an exercise. Your hand pain or discomfort does not improve within 2 hours after you exercise. If you have any of these problems, stop doing these exercises right away. Do not do them again unless your health care provider says that you can. Get help right away if: You develop sudden, severe hand pain or swelling. If this happens, stop doing these exercises right away. Do not do them again unless your health care provider says that you can. This information is not intended to replace advice given to you by your health care provider. Make sure you discuss any questions you have with your healthcare provider. Document Revised: 01/25/2019 Document Reviewed: 10/05/2018 Elsevier Patient Education  2022 Elsevier Inc.  

## 2021-06-01 DIAGNOSIS — H209 Unspecified iridocyclitis: Secondary | ICD-10-CM | POA: Diagnosis not present

## 2021-06-01 DIAGNOSIS — L940 Localized scleroderma [morphea]: Secondary | ICD-10-CM | POA: Diagnosis not present

## 2021-06-01 DIAGNOSIS — Z79899 Other long term (current) drug therapy: Secondary | ICD-10-CM | POA: Diagnosis not present

## 2021-06-03 ENCOUNTER — Ambulatory Visit (INDEPENDENT_AMBULATORY_CARE_PROVIDER_SITE_OTHER): Payer: Medicare Other | Admitting: Family Medicine

## 2021-06-03 ENCOUNTER — Other Ambulatory Visit: Payer: Self-pay

## 2021-06-03 VITALS — BP 110/70 | HR 64 | Temp 96.3°F | Wt 170.6 lb

## 2021-06-03 DIAGNOSIS — R519 Headache, unspecified: Secondary | ICD-10-CM | POA: Diagnosis not present

## 2021-06-03 DIAGNOSIS — R0989 Other specified symptoms and signs involving the circulatory and respiratory systems: Secondary | ICD-10-CM | POA: Diagnosis not present

## 2021-06-03 NOTE — Progress Notes (Signed)
   Subjective:    Patient ID: Rhonda Buchanan, female    DOB: 13-Apr-1946, 75 y.o.   MRN: [id number]  HPI She complains of a 2-week history of headache that can occur in any spot on her head.  She has some nausea associated with that and some slight abdominal discomfort also notes loose stool mainly in the evening.  She is under no stress.  Is had no fever, chills, sore throat, earache or trouble eating.   Review of Systems     Objective:   Physical Exam Alert and in no distress. Tympanic membranes and canals are normal. Pharyngeal area is normal. Neck is supple without adenopathy or thyromegaly. Cardiac exam shows a regular sinus rhythm without murmurs or gallops. Lungs are clear to auscultation. Abdominal exam shows decreased bowel sounds and a bruit was heard.  Femoral pulses difficult to feel.       Assessment & Plan:  Nonintractable headache, unspecified chronicity pattern, unspecified headache type - Plan: CBC with Differential/Platelet, Comprehensive metabolic panel  Abdominal bruit - Plan: US AORTA I explained that I am unsure of exactly what is going on and recommend 2 Tylenol 4 times per day.  We will do routine blood work on her and also order an ultrasound to evaluate the murmur.  Follow-up pending blood work results.

## 2021-06-04 LAB — CBC WITH DIFFERENTIAL/PLATELET
Basophils Absolute: 0.1 10*3/uL (ref 0.0–0.2)
Basos: 1 %
EOS (ABSOLUTE): 0.1 10*3/uL (ref 0.0–0.4)
Eos: 2 %
Hematocrit: 40.2 % (ref 34.0–46.6)
Hemoglobin: 13.8 g/dL (ref 11.1–15.9)
Immature Grans (Abs): 0 10*3/uL (ref 0.0–0.1)
Immature Granulocytes: 0 %
Lymphocytes Absolute: 1.8 10*3/uL (ref 0.7–3.1)
Lymphs: 40 %
MCH: 31.2 pg (ref 26.6–33.0)
MCHC: 34.3 g/dL (ref 31.5–35.7)
MCV: 91 fL (ref 79–97)
Monocytes Absolute: 0.4 10*3/uL (ref 0.1–0.9)
Monocytes: 8 %
Neutrophils Absolute: 2.2 10*3/uL (ref 1.4–7.0)
Neutrophils: 49 %
Platelets: 232 10*3/uL (ref 150–450)
RBC: 4.42 x10E6/uL (ref 3.77–5.28)
RDW: 13.2 % (ref 11.7–15.4)
WBC: 4.5 10*3/uL (ref 3.4–10.8)

## 2021-06-04 LAB — COMPREHENSIVE METABOLIC PANEL
ALT: 23 IU/L (ref 0–32)
AST: 23 IU/L (ref 0–40)
Albumin/Globulin Ratio: 1.6 (ref 1.2–2.2)
Albumin: 4.4 g/dL (ref 3.7–4.7)
Alkaline Phosphatase: 77 IU/L (ref 44–121)
BUN/Creatinine Ratio: 14 (ref 12–28)
BUN: 18 mg/dL (ref 8–27)
Bilirubin Total: 0.8 mg/dL (ref 0.0–1.2)
CO2: 26 mmol/L (ref 20–29)
Calcium: 10.3 mg/dL (ref 8.7–10.3)
Chloride: 97 mmol/L (ref 96–106)
Creatinine, Ser: 1.3 mg/dL — ABNORMAL HIGH (ref 0.57–1.00)
Globulin, Total: 2.7 g/dL (ref 1.5–4.5)
Glucose: 96 mg/dL (ref 65–99)
Potassium: 3.2 mmol/L — ABNORMAL LOW (ref 3.5–5.2)
Sodium: 139 mmol/L (ref 134–144)
Total Protein: 7.1 g/dL (ref 6.0–8.5)
eGFR: 43 mL/min/{1.73_m2} — ABNORMAL LOW (ref >59)

## 2021-06-05 ENCOUNTER — Other Ambulatory Visit: Payer: Self-pay

## 2021-06-05 ENCOUNTER — Emergency Department (HOSPITAL_COMMUNITY): Payer: Medicare Other

## 2021-06-05 ENCOUNTER — Encounter (HOSPITAL_COMMUNITY): Payer: Self-pay | Admitting: Emergency Medicine

## 2021-06-05 ENCOUNTER — Emergency Department (HOSPITAL_COMMUNITY)
Admission: EM | Admit: 2021-06-05 | Discharge: 2021-06-05 | Disposition: A | Discharge status: Home / Self Care | Payer: Medicare Other | Attending: Emergency Medicine | Admitting: Emergency Medicine

## 2021-06-05 DIAGNOSIS — J181 Lobar pneumonia, unspecified organism: Secondary | ICD-10-CM | POA: Insufficient documentation

## 2021-06-05 DIAGNOSIS — M79622 Pain in left upper arm: Secondary | ICD-10-CM | POA: Diagnosis not present

## 2021-06-05 DIAGNOSIS — M549 Dorsalgia, unspecified: Secondary | ICD-10-CM | POA: Diagnosis not present

## 2021-06-05 DIAGNOSIS — J189 Pneumonia, unspecified organism: Secondary | ICD-10-CM

## 2021-06-05 DIAGNOSIS — J45998 Other asthma: Secondary | ICD-10-CM | POA: Insufficient documentation

## 2021-06-05 DIAGNOSIS — Z96659 Presence of unspecified artificial knee joint: Secondary | ICD-10-CM | POA: Insufficient documentation

## 2021-06-05 DIAGNOSIS — R079 Chest pain, unspecified: Secondary | ICD-10-CM

## 2021-06-05 DIAGNOSIS — I10 Essential (primary) hypertension: Secondary | ICD-10-CM | POA: Insufficient documentation

## 2021-06-05 DIAGNOSIS — Z79899 Other long term (current) drug therapy: Secondary | ICD-10-CM | POA: Diagnosis not present

## 2021-06-05 DIAGNOSIS — N179 Acute kidney failure, unspecified: Secondary | ICD-10-CM

## 2021-06-05 DIAGNOSIS — I700 Atherosclerosis of aorta: Secondary | ICD-10-CM | POA: Diagnosis not present

## 2021-06-05 DIAGNOSIS — R52 Pain, unspecified: Secondary | ICD-10-CM

## 2021-06-05 LAB — CBC WITH DIFFERENTIAL/PLATELET
Abs Immature Granulocytes: 0.02 10*3/uL (ref 0.00–0.07)
Basophils Absolute: 0 10*3/uL (ref 0.0–0.1)
Basophils Relative: 1 %
Eosinophils Absolute: 0.1 10*3/uL (ref 0.0–0.5)
Eosinophils Relative: 3 %
HCT: 37.2 % (ref 36.0–46.0)
Hemoglobin: 12.9 g/dL (ref 12.0–15.0)
Immature Granulocytes: 1 %
Lymphocytes Relative: 31 %
Lymphs Abs: 1.4 10*3/uL (ref 0.7–4.0)
MCH: 32.1 pg (ref 26.0–34.0)
MCHC: 34.7 g/dL (ref 30.0–36.0)
MCV: 92.5 fL (ref 80.0–100.0)
Monocytes Absolute: 0.4 10*3/uL (ref 0.1–1.0)
Monocytes Relative: 9 %
Neutro Abs: 2.4 10*3/uL (ref 1.7–7.7)
Neutrophils Relative %: 55 %
Platelets: 231 10*3/uL (ref 150–400)
RBC: 4.02 MIL/uL (ref 3.87–5.11)
RDW: 13.6 % (ref 11.5–15.5)
WBC: 4.3 10*3/uL (ref 4.0–10.5)
nRBC: 0 % (ref 0.0–0.2)

## 2021-06-05 LAB — COMPREHENSIVE METABOLIC PANEL
ALT: 23 U/L (ref 0–44)
AST: 24 U/L (ref 15–41)
Albumin: 3.8 g/dL (ref 3.5–5.0)
Alkaline Phosphatase: 57 U/L (ref 38–126)
Anion gap: 9 (ref 5–15)
BUN: 17 mg/dL (ref 8–23)
CO2: 26 mmol/L (ref 22–32)
Calcium: 9.2 mg/dL (ref 8.9–10.3)
Chloride: 93 mmol/L — ABNORMAL LOW (ref 98–111)
Creatinine, Ser: 1.41 mg/dL — ABNORMAL HIGH (ref 0.44–1.00)
GFR, Estimated: 39 mL/min — ABNORMAL LOW (ref >60)
Glucose, Bld: 115 mg/dL — ABNORMAL HIGH (ref 70–99)
Potassium: 2.9 mmol/L — ABNORMAL LOW (ref 3.5–5.1)
Sodium: 128 mmol/L — ABNORMAL LOW (ref 135–145)
Total Bilirubin: 0.9 mg/dL (ref 0.3–1.2)
Total Protein: 6.9 g/dL (ref 6.5–8.1)

## 2021-06-05 LAB — TROPONIN I (HIGH SENSITIVITY): Troponin I (High Sensitivity): 7 ng/L (ref <18)

## 2021-06-05 MED ORDER — DOXYCYCLINE HYCLATE 100 MG PO TABS
100.0000 mg | ORAL_TABLET | Freq: Once | ORAL | Status: AC
  Administered 2021-06-05: 100 mg via ORAL
  Filled 2021-06-05: qty 1

## 2021-06-05 MED ORDER — DOXYCYCLINE HYCLATE 100 MG PO CAPS: 100.0000 mg | ORAL_CAPSULE | Freq: Two times a day (BID) | ORAL | 0 refills | Status: DC

## 2021-06-05 MED ORDER — POTASSIUM CHLORIDE CRYS ER 20 MEQ PO TBCR
20.0000 meq | EXTENDED_RELEASE_TABLET | Freq: Once | ORAL | Status: AC
  Administered 2021-06-05: 20 meq via ORAL
  Filled 2021-06-05: qty 1

## 2021-06-05 NOTE — ED Triage Notes (Signed)
Pt here for L shoulder and arm pain that started today along w/ tingling in L face and L hand. Pt denies injury/trauma. No unilateral weakness or drift.

## 2021-06-05 NOTE — ED Provider Notes (Signed)
[facility] EMERGENCY DEPARTMENT Provider Note   CSN: [id number] Arrival date & time: 06/05/21  1817     History Chief Complaint  Patient presents with   Arm Pain    [place] M Chhim is a 75 y.o. female.  Patient is a 75 yo female with pmh of hyperlipidemia and htn presenting for complaints of chest pain. Patient admits to left chest pain that began yesterday described as "feeling funny", intermittent, occurring at rest, associated with left upper extremity pain. Denies nausea, sweating, or radiation to neck/back.   The history is provided by the patient. No language interpreter was used.  Arm Pain This is a new problem. The current episode started 12 to 24 hours ago. The problem has not changed since onset.Associated symptoms include chest pain. Pertinent negatives include no abdominal pain and no shortness of breath.      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     Family History  Problem Relation Age of  Onset   Ulcers Brother    Heart disease Brother 18       MI   Cancer Father    Ulcers Sister    Cancer Maternal Grandmother    Heart disease Maternal Grandfather     Social History   Tobacco Use   Smoking status: Never   Smokeless tobacco: Never  Vaping Use   Vaping Use: Never used  Substance Use Topics   Alcohol use: No   Drug use: No    Home Medications Prior to Admission medications   Medication Sig Start Date End Date Taking? Authorizing Provider  Brinzolamide-Brimonidine 1-0.2 % SUSP Apply 1 drop to eye 3 (three) times daily.     [provider]  calcium carbonate (OS-CAL) 600 MG TABS Take 600 mg by mouth daily.    [provider]  fluocinonide ointment (LIDEX) 0.05 % Apply twice a day to affected areas 04/11/17   [provider]  folic acid (FOLVITE) 1 MG tablet Take 3 mg by mouth daily.     [provider]  lisinopril-hydrochlorothiazide (ZESTORETIC) 10-12.5 MG tablet Take 1 tablet by mouth daily. 04/15/21   Denita Lung, MD  loratadine (CLARITIN) 10 MG tablet Take 10 mg by mouth as needed.     [provider]  methotrexate (RHEUMATREX) 2.5 MG tablet TAKE( 6 )'15MG'$  ONCE A WEEK Caution:Chemotherapy. Protect from light.    [provider]  metoprolol succinate (  TOPROL-XL) 50 MG 24 hr tablet TAKE 1/2 TABLET BY MOUTH DAILY. TAKE WITH OR IMMEDIATELY FOLLOWING A MEAL. 04/15/21   Denita Lung, MD  multivitamin [facility]) per tablet Take 1 tablet by mouth daily.    [provider]  nystatin (MYCOSTATIN/NYSTOP) powder Apply to moist areas as needed 06/05/18   [provider]  prednisoLONE acetate (PRED FORTE) 1 % ophthalmic suspension Place 1 drop into both eyes daily.     [provider]  rosuvastatin (CRESTOR) 40 MG tablet Take 1 tablet (40 mg total) by mouth daily. 04/15/21   Denita Lung, MD  timolol (BETIMOL) 0.5 % ophthalmic solution Place 1 drop into both eyes daily.    [provider]    Allergies    Ibuprofen  Review of Systems   Review of Systems  Constitutional:  Negative for chills and fever.  HENT:  Negative for ear pain and sore throat.   Eyes:  Negative for pain and visual disturbance.  Respiratory:  Negative for cough and shortness of breath.   Cardiovascular:  Positive for chest pain. Negative for palpitations.  Gastrointestinal:  Negative for abdominal pain and vomiting.  Genitourinary:  Negative for dysuria and hematuria.  Musculoskeletal:  Negative for arthralgias and back pain.  Skin:  Negative for color change and rash.  Neurological:  Negative for seizures and syncope.  All other systems reviewed and are negative.  Physical Exam Updated Vital Signs BP (!) 131/53 (BP Location: Right Arm)   Pulse 67   Temp 98.9 F (37.2 C) (Oral)   Resp 16   Ht '5\' 6"'$  (1.676 m)   Wt 77.1 kg   LMP 02/16/2003   SpO2 100%   BMI 27.44 kg/m   Physical Exam Vitals and nursing note reviewed.  Constitutional:      General: She is not in acute distress.    Appearance: She is well-developed.  HENT:     Head: Normocephalic and atraumatic.  Eyes:     Conjunctiva/sclera: Conjunctivae normal.  Cardiovascular:     Rate and Rhythm: Normal rate and regular rhythm.     Heart sounds: No murmur heard. Pulmonary:     Effort: Pulmonary effort is normal. No respiratory distress.     Breath sounds: Normal breath sounds.  Abdominal:     Palpations: Abdomen is soft.     Tenderness: There is no abdominal tenderness.  Musculoskeletal:     Cervical back: Neck supple.  Skin:    General: Skin is warm and dry.  Neurological:     Mental Status: She is alert.    ED Results / Procedures / Treatments   Labs (all labs ordered are listed, but only abnormal results are displayed) Labs Reviewed  CBC WITH DIFFERENTIAL/PLATELET  COMPREHENSIVE METABOLIC PANEL  TROPONIN I (HIGH SENSITIVITY)    EKG None  Radiology No results found.  Procedures Procedures    Medications Ordered in ED Medications - No data to display  ED Course  I have reviewed the triage vital signs and the nursing notes.  Pertinent labs & imaging results that were available during my care of the patient were reviewed by me and considered in my medical decision making (see chart for details).    MDM Rules/Calculators/A&P                           8:38 PM 75 yo female with pmh of hyperlipidemia and htn presenting for complaints of chest pain. Patient  is Aox3, no acute distress, afebrile, with stable vitals. Physical exam demonstrates equal bilateral breath sounds with no adventitious lung sounds. EKG stable with no ST segment elevation or depression. Stable intervals.  Troponin stable. CXR concerning for developing pneumonia. Doxycycline provided with close follow up instruction with pcp.   Patient in no distress and overall condition improved here in the ED. Detailed discussions were had with the patient regarding current findings, and need for close f/u with PCP or on call doctor. The patient has been instructed to return immediately if the symptoms worsen in any way for re-evaluation. Patient verbalized understanding and is in agreement with current care plan. All questions answered prior to discharge.      Final Clinical Impression(s) / ED Diagnoses Final diagnoses:  Chest pain, unspecified type  Community acquired pneumonia of left lung, unspecified part of lung  AKI (acute kidney injury) [facility])    Rx / DC Orders ED Discharge Orders     None        Lianne Cure, DO [id number]

## 2021-06-05 NOTE — ED Provider Notes (Signed)
Emergency Medicine Provider Triage Evaluation Note  [place] M Schlie , a 75 y.o. female  was evaluated in triage.  Pt complains of left arm pain today.  Onset was between noon and 3 PM.  She states she denies any trauma or heavy lifting. She denies any chest pain currently states she has some chest discomfort 2 days ago.  No shortness of breath nausea or vomiting.  She states that the pain began radiating more from her arm into her neck an hour or 2 ago.   Review of Systems  Positive: Left arm pain Negative: Fever, chest pain  Physical Exam  LMP 02/16/2003  Gen:   Awake, no distress   Resp:  Normal effort  MSK:   Moves extremities without difficulty  Other:  No palpable tenderness of the left upper extremity or chest or cervical spine.  Medical Decision Making  Medically screening exam initiated at 6:24 PM.  Appropriate orders placed.  [place] LOLETHA TEATE was informed that the remainder of the evaluation will be completed by another provider, this initial triage assessment does not replace that evaluation, and the importance of remaining in the ED until their evaluation is complete.  Very low suspicion for this being a stroke or heart attack however given that patient is having some radiating atraumatic left arm pain will obtain EKG, single troponin given her symptoms started shortly by 2 PM and basic labs.  No trauma which makes fracture much less likely.    Tedd Sias, [place] 06/05/21 1843    Arnaldo Natal, MD 06/05/21 [phone]

## 2021-06-05 NOTE — ED Notes (Signed)
Patient transported to X-ray 

## 2021-06-08 ENCOUNTER — Telehealth: Payer: Self-pay

## 2021-06-08 NOTE — Telephone Encounter (Signed)
I called pt. Per pt. Ping she was recently in the hospital for arm pain and chest pain on 06/05/21 ER visit only. I let her know she could have an ER f/u here if needed per ED note in the next five days. She stated she was doing good at this time and did not need a f/u apt.

## 2021-06-09 ENCOUNTER — Other Ambulatory Visit: Payer: Self-pay

## 2021-06-09 ENCOUNTER — Ambulatory Visit (HOSPITAL_BASED_OUTPATIENT_CLINIC_OR_DEPARTMENT_OTHER): Payer: Medicare Other | Attending: Family Medicine | Admitting: Internal Medicine

## 2021-06-09 DIAGNOSIS — G4710 Hypersomnia, unspecified: Secondary | ICD-10-CM | POA: Diagnosis not present

## 2021-06-12 ENCOUNTER — Ambulatory Visit
Admission: RE | Admit: 2021-06-12 | Discharge: 2021-06-12 | Disposition: A | Discharge status: Home / Self Care | Payer: Medicare Other | Source: Ambulatory Visit | Attending: Family Medicine | Admitting: Family Medicine

## 2021-06-12 DIAGNOSIS — R0989 Other specified symptoms and signs involving the circulatory and respiratory systems: Secondary | ICD-10-CM | POA: Diagnosis not present

## 2021-06-12 DIAGNOSIS — I700 Atherosclerosis of aorta: Secondary | ICD-10-CM | POA: Diagnosis not present

## 2021-06-13 DIAGNOSIS — G4710 Hypersomnia, unspecified: Secondary | ICD-10-CM | POA: Diagnosis not present

## 2021-06-13 NOTE — Procedures (Signed)
    Patient Name: Rhonda Buchanan, [place] Study Date: 06/10/2021 Gender: Female D.O.B: [birth year] Age (years): 23 Referring Provider: Denita Lung Height (inches): 53 Interpreting Physician: Baird Lyons MD, ABSM Weight (lbs): 175 RPSGT: Jacolyn Reedy BMI: 29 MRN: [id number]  CLINICAL INFORMATION Sleep Study Type: HST Indication for sleep study: OSA Epworth Sleepiness Score: 3  SLEEP STUDY TECHNIQUE A multi-channel overnight portable sleep study was performed. The channels recorded were: nasal airflow, thoracic respiratory movement, and oxygen saturation with a pulse oximetry. Snoring was also monitored.  MEDICATIONS Patient self administered medications include: none reported.  SLEEP ARCHITECTURE Patient was studied for 408.8 minutes. The sleep efficiency was 100.0 % and the patient was supine for 0%. The arousal index was 0.0 per hour.  RESPIRATORY PARAMETERS The overall AHI was 1.0 per hour, with a central apnea index of 0 per hour. The oxygen nadir was 94% during sleep.  CARDIAC DATA Mean heart rate during sleep was 57.3 bpm.  IMPRESSIONS - No significant obstructive sleep apnea occurred during this study (AHI = 1.0/h). - The patient had minimal or no oxygen desaturation during the study (Min O2 = 94%) - No snoring was audible during this study.  DIAGNOSIS - Normal study  RECOMMENDATIONS - Manage for symptoms based on clinial judgment. - Sleep hygiene should be reviewed to assess factors that may improve sleep quality. - Weight management and regular exercise should be initiated or continued.  [Electronically signed] 06/13/2021 12:07 PM  Baird Lyons MD, [place], American Board of Sleep Medicine   NPI: [id number]                         [place], [facility]  ELECTRONICALLY SIGNED ON:  06/13/2021, 12:05 PM [place] PH: [phone]   FX: [phone]  [phone] [place]

## 2021-06-23 ENCOUNTER — Other Ambulatory Visit: Payer: Self-pay

## 2021-06-23 DIAGNOSIS — I2510 Atherosclerotic heart disease of native coronary artery without angina pectoris: Secondary | ICD-10-CM

## 2021-06-24 ENCOUNTER — Telehealth: Payer: Self-pay

## 2021-06-24 NOTE — Telephone Encounter (Signed)
Pt was called to schedule her for follow up on sleep study. [place]

## 2021-06-25 ENCOUNTER — Other Ambulatory Visit: Payer: Self-pay

## 2021-06-25 ENCOUNTER — Telehealth (INDEPENDENT_AMBULATORY_CARE_PROVIDER_SITE_OTHER): Payer: Medicare Other | Admitting: Family Medicine

## 2021-06-25 VITALS — Ht 66.0 in | Wt 170.0 lb

## 2021-06-25 DIAGNOSIS — G479 Sleep disorder, unspecified: Secondary | ICD-10-CM

## 2021-06-25 DIAGNOSIS — R109 Unspecified abdominal pain: Secondary | ICD-10-CM

## 2021-06-25 DIAGNOSIS — R110 Nausea: Secondary | ICD-10-CM | POA: Diagnosis not present

## 2021-06-25 MED ORDER — HYOSCYAMINE SULFATE ER 0.375 MG PO TB12: 0.3750 mg | ORAL_TABLET | Freq: Two times a day (BID) | ORAL | 0 refills | Status: DC

## 2021-06-25 NOTE — Progress Notes (Signed)
   Subjective:    Patient ID: Rhonda Buchanan, female    DOB: Nov 23, 1945, 75 y.o.   MRN: [id number]  HPI Documentation for virtual audio and video telecommunications through [place] encounter: The patient was located at home. 2 patient identifiers used.  The provider was located in the office. The patient did consent to this visit and is aware of possible charges through their insurance for this visit. The other persons participating in this telemedicine service were none. Time spent on call was 10 minutes and in review of previous records >15 minutes total for counseling and coordination of care. This virtual service is not related to other E/M service within previous 7 days.  She is here for consult concerning a recent sleep study.  She apparently wakes up frequently and has trouble falling back to sleep and has very altered sleep pattern.  The sleep study came out negative. Her other concern is a several month history of difficulty with waking up in the morning feeling nauseated, eating anything followed with some abdominal cramping and then diarrhea within a short period of time after that  Review of Systems     Objective:   Physical Exam  Alert and in no distress otherwise not examined  Recent emergency room visit including blood work was evaluated.  It did show electrolyte abnormalities that will need to be followed up on. Recent sleep study was negative.    Assessment & Plan:  Sleep disturbance  Nausea  Abdominal cramping - Plan: hyoscyamine (LEVBID) 0.375 MG 12 hr tablet I will have her come in next week for blood work to look over the electrolyte abnormalities.  We will also discuss sleep and sleep hygiene with her as she does not have evidence of OSA. Also reevaluate the nausea and diarrhea to see if we can improve that.

## 2021-06-26 ENCOUNTER — Other Ambulatory Visit: Payer: Self-pay

## 2021-06-26 DIAGNOSIS — I739 Peripheral vascular disease, unspecified: Secondary | ICD-10-CM

## 2021-07-02 ENCOUNTER — Telehealth: Payer: Self-pay

## 2021-07-02 NOTE — Telephone Encounter (Signed)
Pt. Called stating that had not heard back form anyone regarding her blood work from the ER that you did not like. She thought you wanted her to f/u on the labs here. I can call her back and schedule her just let me know when you want her to come to recheck labs.

## 2021-07-03 ENCOUNTER — Other Ambulatory Visit: Payer: Medicare Other

## 2021-07-03 ENCOUNTER — Other Ambulatory Visit: Payer: Self-pay

## 2021-07-03 ENCOUNTER — Encounter: Payer: Self-pay | Admitting: Vascular Surgery

## 2021-07-03 ENCOUNTER — Ambulatory Visit (INDEPENDENT_AMBULATORY_CARE_PROVIDER_SITE_OTHER): Payer: Medicare Other | Admitting: Vascular Surgery

## 2021-07-03 ENCOUNTER — Ambulatory Visit (HOSPITAL_COMMUNITY)
Admission: RE | Admit: 2021-07-03 | Discharge: 2021-07-03 | Disposition: A | Discharge status: Home / Self Care | Payer: Medicare Other | Source: Ambulatory Visit | Attending: Vascular Surgery | Admitting: Vascular Surgery

## 2021-07-03 VITALS — BP 95/59 | HR 73 | Temp 98.2°F | Resp 18 | Ht 66.0 in | Wt 170.0 lb

## 2021-07-03 DIAGNOSIS — I739 Peripheral vascular disease, unspecified: Secondary | ICD-10-CM | POA: Diagnosis not present

## 2021-07-03 DIAGNOSIS — E871 Hypo-osmolality and hyponatremia: Secondary | ICD-10-CM | POA: Diagnosis not present

## 2021-07-03 NOTE — Progress Notes (Signed)
Office Note     CC: Incidental finding of severe atherosclerosis of the aorta Requesting Provider:  Denita Lung, MD  HPI: Rhonda Buchanan is a 75 y.o. [place] native (Jun 24, 1946) female who presents at the request of her PCP Dr. Redmond School following a work-up for nocturnal abdominal cramping.  At her visit physical exam demonstrated an abdominal bruit, concerning for possible aneurysm and therefore an ultrasound of the aorta was ordered. Since that time, Rhonda Buchanan's abdominal pain has subsided the pain was described as nocturnal, and associated with nausea, no vomiting.  When probed further, Rhonda stated she has never had postprandial abdominal pain or recent weight loss.  She denies symptoms of claudication, rest pain or tissue loss.   She has had previous bouts of abdominal pain, with history of peptic ulcer disease he is unable to take aspirin after this also been a nidus for pain.  The pt is on a statin for cholesterol management.  The pt is not on a daily aspirin.   Other AC:  - The pt is on medication for hypertension.   The pt is not diabetic.   Tobacco hx:  never  Past Medical History:  Diagnosis Date   Allergy    Arthritis    Hypercholesterolemia    Hypertension    PUD (peptic ulcer disease)    Scleroderma ([place])     Past Surgical History:  Procedure Laterality Date   arthroscopy     CYST REMOVAL HAND     EYE SURGERY Left 10/25/2017   laser surgery    KNEE ARTHROPLASTY      Social History   Socioeconomic History   Marital status: Divorced    Spouse name: Not on file   Number of children: Not on file   Years of education: Not on file   Highest education level: Not on file  Occupational History   Not on file  Tobacco Use   Smoking status: Never   Smokeless tobacco: Never  Vaping Use   Vaping Use: Never used  Substance and Sexual Activity   Alcohol use: No   Drug use: No   Sexual activity: Not Currently  Other Topics Concern   Not on file  Social  History Narrative   Not on file   Social Determinants of Health   Financial Resource Strain: Not on file  Food Insecurity: Not on file  Transportation Needs: Not on file  Physical Activity: Not on file  Stress: Not on file  Social Connections: Not on file  Intimate Partner Violence: Not on file    Family History  Problem Relation Age of Onset   Ulcers Brother    Heart disease Brother 14       MI   Cancer Father    Ulcers Sister    Cancer Maternal Grandmother    Heart disease Maternal Grandfather     Current Outpatient Medications  Medication Sig Dispense Refill   Brinzolamide-Brimonidine 1-0.2 % SUSP Apply 1 drop to eye 3 (three) times daily.      calcium carbonate (OS-CAL) 600 MG TABS Take 600 mg by mouth daily.     fluocinonide ointment (LIDEX) 0.05 % Apply twice a day to affected areas     folic acid (FOLVITE) 1 MG tablet Take 3 mg by mouth daily.      hyoscyamine (LEVBID) 0.375 MG 12 hr tablet Take 1 tablet (0.375 mg total) by mouth 2 (two) times daily. 20 tablet 0   lisinopril-hydrochlorothiazide (ZESTORETIC) 10-12.5 MG tablet Take  1 tablet by mouth daily. 90 tablet 3   loratadine (CLARITIN) 10 MG tablet Take 10 mg by mouth as needed.      methotrexate (RHEUMATREX) 2.5 MG tablet TAKE( 6 )'15MG'$  ONCE A WEEK Caution:Chemotherapy. Protect from light.     metoprolol succinate (TOPROL-XL) 50 MG 24 hr tablet TAKE 1/2 TABLET BY MOUTH DAILY. TAKE WITH OR IMMEDIATELY FOLLOWING A MEAL. 45 tablet 3   multivitamin (THERAGRAN) per tablet Take 1 tablet by mouth daily.     nystatin (MYCOSTATIN/NYSTOP) powder Apply to moist areas as needed     prednisoLONE acetate (PRED FORTE) 1 % ophthalmic suspension Place 1 drop into both eyes daily.      rosuvastatin (CRESTOR) 40 MG tablet Take 1 tablet (40 mg total) by mouth daily. 90 tablet 3   timolol (BETIMOL) 0.5 % ophthalmic solution Place 1 drop into both eyes daily.     No current facility-administered medications for this visit.     Allergies  Allergen Reactions   Ibuprofen     GI upset     REVIEW OF SYSTEMS:   '[X]'$  denotes positive finding, '[ ]'$  denotes negative finding Cardiac  Comments:  Chest pain or chest pressure:    Shortness of breath upon exertion:    Short of breath when lying flat:    Irregular heart rhythm:        Vascular    Pain in calf, thigh, or hip brought on by ambulation:    Pain in feet at night that wakes you up from your sleep:     Blood clot in your veins:    Leg swelling:         Pulmonary    Oxygen at home:    Productive cough:     Wheezing:         Neurologic    Sudden weakness in arms or legs:     Sudden numbness in arms or legs:     Sudden onset of difficulty speaking or slurred speech:    Temporary loss of vision in one eye:     Problems with dizziness:         Gastrointestinal    Blood in stool:     Vomited blood:         Genitourinary    Burning when urinating:     Blood in urine:        Psychiatric    Major depression:         Hematologic    Bleeding problems:    Problems with blood clotting too easily:        Skin    Rashes or ulcers:        Constitutional    Fever or chills:      PHYSICAL EXAMINATION:  Vitals:   07/03/21 1132  BP: (!) 95/59  Pulse: 73  Resp: 18  Temp: 98.2 F (36.8 C)  SpO2: 98%  Weight: 170 lb (77.1 kg)  Height: '5\' 6"'$  (1.676 m)    General:  WDWN in NAD; vital signs documented above Gait: Not observed HENT: WNL, normocephalic Pulmonary: normal non-labored breathing , without Rales, rhonchi,  wheezing Cardiac: regular HR,  Abdomen: soft, NT, no masses Skin: without rashes Vascular Exam/Pulses:  Right Left  Radial 2+ (normal) 2+ (normal)  Ulnar 2+ (normal) 2+ (normal)  Femoral - -  Popliteal - -  DP 1+ (weak) 1+ (weak)  PT nonpalpable nonpalpable   Extremities: without ischemic changes, without Gangrene , without cellulitis; without open  wounds;  Musculoskeletal: no muscle wasting or atrophy  Neurologic:  A&O X 3;  No focal weakness or paresthesias are detected Psychiatric:  The pt has Normal affect.   Non-Invasive Vascular Imaging:   The patient's previous abdominal ultrasound was independently reviewed demonstrating diffuse atherosclerosis of the aorta with no aneurysmal degeneration of the aorta or iliac arteries.  The patient's ABI studies demonstrates mild peripheral arterial disease disease in bilateral lower extremities.    ASSESSMENT/PLAN:: 75 y.o. female presenting with incidental finding of diffuse atherosclerosis within the abdominal aorta and mild peripheral arterial disease.  The patient is currently on best medical therapy -being high intensity statin.  Unable to take aspirin.  I discussed the ultrasound findings with Rhonda and was encouraged that her abdominal pain resolved.  In highly diseased aorta's, mesenteric vasculature can be affected.  Rhonda does not have signs or symptoms concerning for chronic mesenteric ischemia or other end-organ perfusion issues.  She has mild peripheral arterial disease in her legs, but this is asymptomatic.  I discussed the above findings with Rhonda and asked her to call our office if any questions or concerns arose  We will continue to see Rhonda Buchanan in our office yearly with follow-up ABI.    Broadus John, MD [facility] [phone]

## 2021-07-04 LAB — COMPREHENSIVE METABOLIC PANEL
ALT: 22 IU/L (ref 0–32)
AST: 25 IU/L (ref 0–40)
Albumin/Globulin Ratio: 1.9 (ref 1.2–2.2)
Albumin: 4.3 g/dL (ref 3.7–4.7)
Alkaline Phosphatase: 73 IU/L (ref 44–121)
BUN/Creatinine Ratio: 10 — ABNORMAL LOW (ref 12–28)
BUN: 16 mg/dL (ref 8–27)
Bilirubin Total: 0.5 mg/dL (ref 0.0–1.2)
CO2: 27 mmol/L (ref 20–29)
Calcium: 10.3 mg/dL (ref 8.7–10.3)
Chloride: 97 mmol/L (ref 96–106)
Creatinine, Ser: 1.54 mg/dL — ABNORMAL HIGH (ref 0.57–1.00)
Globulin, Total: 2.3 g/dL (ref 1.5–4.5)
Glucose: 103 mg/dL — ABNORMAL HIGH (ref 65–99)
Potassium: 3.1 mmol/L — ABNORMAL LOW (ref 3.5–5.2)
Sodium: 138 mmol/L (ref 134–144)
Total Protein: 6.6 g/dL (ref 6.0–8.5)
eGFR: 35 mL/min/{1.73_m2} — ABNORMAL LOW (ref >59)

## 2021-07-07 DIAGNOSIS — H04129 Dry eye syndrome of unspecified lacrimal gland: Secondary | ICD-10-CM | POA: Diagnosis not present

## 2021-07-07 DIAGNOSIS — Z961 Presence of intraocular lens: Secondary | ICD-10-CM | POA: Diagnosis not present

## 2021-07-07 DIAGNOSIS — H209 Unspecified iridocyclitis: Secondary | ICD-10-CM | POA: Diagnosis not present

## 2021-07-07 DIAGNOSIS — H43823 Vitreomacular adhesion, bilateral: Secondary | ICD-10-CM | POA: Diagnosis not present

## 2021-07-14 NOTE — Progress Notes (Signed)
Office Visit Note  Patient: Rhonda Buchanan             Date of Birth: [birth year]           MRN: [id number]             PCP: Denita Lung, MD Referring: Denita Lung, MD Visit Date: 07/28/2021 Occupation: @GUAROCC @  Subjective:  Medication monitoring   History of Present Illness: Rhonda Buchanan is a 75 y.o. female with history of scleroderma.  She is taking methotrexate 5 tablets once weekly and folic acid 2 mg daily prescribed by her dermatologist.  She reduced the dose of methotrexate from 6 tablets to 5 tablets weekly for the past 3 weeks due to the concern for elevated creatinine and low white blood cell count.  She has been tolerating the reduced dose of methotrexate and has not experienced any signs or symptoms of a flare.  She denies any active morphea lesions at this time.  She denies any facial rashes.  She continues to follow-up with her dermatologist closely.  She has not noticed any skin tightness or skin thickening.  She denies any symptoms of Raynaud's recently.  She denies any shortness of breath or pleuritic chest pain.  She states that she needs to call to schedule appointment with her cardiologist for yearly echocardiogram and PFTs.  She denies any dysphagia or constipation.  She denies any eye pain or inflammation recently.  She was evaluated by Dr. Manuella Ghazi on 07/07/2021 and did not have any inflammation in her eyes at that time.  She avoids the use of NSAIDs. She denies any recent infections.  She is planning on receiving the COVID-19 booster as well as the annual influenza vaccination.    Activities of Daily Living:  Patient reports morning stiffness for 0 minutes.   Patient Denies nocturnal pain.  Difficulty dressing/grooming: Denies Difficulty climbing stairs: Reports Difficulty getting out of chair: Reports Difficulty using hands for taps, buttons, cutlery, and/or writing: Denies  Review of Systems  Constitutional:  Positive for fatigue.  HENT:  Negative  for mouth sores, mouth dryness and nose dryness.   Eyes:  Negative for pain, itching and dryness.  Respiratory:  Negative for shortness of breath and difficulty breathing.   Cardiovascular:  Positive for palpitations. Negative for chest pain.  Gastrointestinal:  Negative for blood in stool, constipation and diarrhea.  Endocrine: Negative for increased urination.  Genitourinary:  Negative for difficulty urinating.  Musculoskeletal:  Negative for joint swelling, myalgias, morning stiffness, muscle tenderness and myalgias.  Skin:  Negative for color change, rash and redness.  Allergic/Immunologic: Negative for susceptible to infections.  Neurological:  Positive for headaches. Negative for dizziness, numbness, memory loss and weakness.  Hematological:  Negative for bruising/bleeding tendency.  Psychiatric/Behavioral:  Negative for confusion.    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/14/2014   Scleroderma ([place]) 07/12/2013   Hypertension 03/04/2011   Hyperlipidemia 03/04/2011   Allergic rhinitis 03/04/2011   Arthritis 03/04/2011   Migraine variant 04/18/1995    Past Medical History:  Diagnosis Date   Allergy  Arthritis    Hypercholesterolemia    Hypertension    PUD (peptic ulcer disease)    Scleroderma ([place])     Family History  Problem Relation Age of Onset   Ulcers Brother    Heart disease Brother 67       MI   Cancer Father    Ulcers Sister    Cancer Maternal Grandmother    Heart disease Maternal Grandfather    Past Surgical History:  Procedure Laterality  Date   arthroscopy     CYST REMOVAL HAND     EYE SURGERY Left 10/25/2017   laser surgery    KNEE ARTHROPLASTY     Social History   Social History Narrative   Not on file   Immunization History  Administered Date(s) Administered   Fluad Quad(high Dose 65+) 12/19/2020   H1N1 08/22/2009   Influenza, High Dose Seasonal PF 06/24/2015, 10/05/2016, 08/04/2018   Influenza,inj,Quad PF,6+ Mos 07/12/2013, 01/15/2015   Influenza-Unspecified 06/24/2015, 10/05/2016, 12/24/2017, 08/04/2018   PFIZER(Purple Top)SARS-COV-2 Vaccination 12/13/2019, 01/08/2020, 10/03/2020   Pneumococcal Conjugate-13 05/20/2014   Pneumococcal Polysaccharide-23 01/31/2007   Tdap 01/31/2007, 12/19/2020   Zoster, Live 07/02/2008     Objective: Vital Signs: BP 96/64 (BP Location: Left Arm, Patient Position: Sitting, Cuff Size: Normal)   Pulse 78   Ht 5\' 6"  (1.676 m)   Wt 171 lb 9.6 oz (77.8 kg)   LMP 02/16/2003   BMI 27.70 kg/m    Physical Exam Vitals and nursing note reviewed.  Constitutional:      Appearance: She is well-developed.  HENT:     Head: Normocephalic and atraumatic.  Eyes:     Conjunctiva/sclera: Conjunctivae normal.  Cardiovascular:     Rate and Rhythm: Normal rate and regular rhythm.     Heart sounds: Normal heart sounds.  Pulmonary:     Effort: Pulmonary effort is normal.     Breath sounds: Normal breath sounds.  Abdominal:     General: Bowel sounds are normal.     Palpations: Abdomen is soft.  Musculoskeletal:     Cervical back: Normal range of motion.  Skin:    General: Skin is warm and dry.     Capillary Refill: Capillary refill takes less than 2 seconds.     Comments: No digital ulcerations or signs of sclerodactyly   Neurological:     Mental Status: She is alert and oriented to person, place, and time.  Psychiatric:        Behavior: Behavior normal.     Musculoskeletal Exam: C-spine has good ROM with no discomfort.  Shoulder joints, elbow joints, wrist joints, MCPs, PIPs,  and DIPs good ROM with no synovitis.  Complete fist formation bilaterally.  CMC, PIP, and DIP thickening consistent with OA of both hands. Hip joints have good ROM with no discomfort.  Knee joints have good ROM with no warmth or effusion.  Ankle joints have good ROM with no tenderness or joint swelling.   CDAI Exam: CDAI Score: -- Patient Global: --; Provider Global: -- Swollen: --; Tender: -- Joint Exam 07/28/2021   No joint exam has been documented for this visit   There is currently no information documented on the homunculus. Go to the Rheumatology activity and complete the homunculus joint exam.  Investigation: No additional findings.  Imaging: VAS [place] ABI WITH/WO TBI  Result Date: 07/03/2021  LOWER EXTREMITY DOPPLER STUDY Patient Name:  Rhonda M Manganelli  Date of Exam:   07/03/2021 Medical Rec #: [id number]  Accession #:    [id number] Date of Birth: 06-29-1946          Patient Gender: F Patient Age:   43 years Exam Location:  [facility] Procedure:      VAS [place] ABI WITH/WO TBI Referring Phys: JOSHUA ROBINS --------------------------------------------------------------------------------  High Risk Factors: Hypertension, hyperlipidemia, no history of smoking. Other Factors: Abdominal bruit.  Performing Technologist: Delorise Shiner RVT  Examination Guidelines: A complete evaluation includes at minimum, Doppler waveform signals and systolic blood pressure reading at the level of bilateral brachial, anterior tibial, and posterior tibial arteries, when vessel segments are accessible. Bilateral testing is considered an integral part of a complete examination. Photoelectric Plethysmograph (PPG) waveforms and toe systolic pressure readings are included as required and additional duplex testing as needed. Limited examinations for reoccurring indications may be performed as noted.  ABI Findings: +---------+------------------+-----+---------+--------+ Right    Rt Pressure  (mmHg)IndexWaveform Comment  +---------+------------------+-----+---------+--------+ Brachial 113                                      +---------+------------------+-----+---------+--------+ PTA      95                0.84 triphasic         +---------+------------------+-----+---------+--------+ DP       85                0.75 triphasic         +---------+------------------+-----+---------+--------+ Great Toe74                0.65                   +---------+------------------+-----+---------+--------+ +---------+------------------+-----+---------+-------+ Left     Lt Pressure (mmHg)IndexWaveform Comment +---------+------------------+-----+---------+-------+ Brachial 111                                     +---------+------------------+-----+---------+-------+ PTA      94                0.83 triphasic        +---------+------------------+-----+---------+-------+ DP       74                0.65 triphasic        +---------+------------------+-----+---------+-------+ Great Toe57                0.50                  +---------+------------------+-----+---------+-------+ +-------+-----------+-----------+------------+------------+ ABI/TBIToday's ABIToday's TBIPrevious ABIPrevious TBI +-------+-----------+-----------+------------+------------+ Right  0.84       0.65                                +-------+-----------+-----------+------------+------------+ Left   0.83       0.50                                +-------+-----------+-----------+------------+------------+  Summary: Right: Resting right ankle-brachial index indicates mild right lower extremity arterial disease. The right toe-brachial index is abnormal. Left: Resting left ankle-brachial index indicates mild left lower extremity arterial disease. The left toe-brachial index is abnormal.  *See table(s) above for measurements and observations.  Electronically signed by Orlie Pollen on  07/03/2021 at 12:45:40 PM.    Final     Recent Labs: Lab Results  Component Value Date   WBC 4.3 06/05/2021   HGB 12.9 06/05/2021   PLT 231 06/05/2021   NA 138 07/03/2021   K 3.1 (L) 07/03/2021   CL 97 07/03/2021   CO2 27 07/03/2021   GLUCOSE 103 (H) 07/03/2021   BUN 16 07/03/2021   CREATININE 1.54 (H) 07/03/2021   BILITOT 0.5 07/03/2021   ALKPHOS 73 07/03/2021   AST 25 07/03/2021   ALT 22 07/03/2021   PROT 6.6 07/03/2021   ALBUMIN 4.3 07/03/2021   CALCIUM 10.3 07/03/2021   GFRAA 62 08/01/2020    Speciality Comments: No specialty comments available.  Procedures:  No procedures performed Allergies: Ibuprofen   Assessment / Plan:     Visit Diagnoses: Scleroderma ([place]) - RF-, Anti-CCP-, ANA negative, - PanANCA, Hx of morphea, raynaud's, recurrent iridocyclitis:  There does not appear to be any progression in her disease.  No signs of sclerodactyly were noted.  She has not had any symptoms of Raynaud's recently.  She had good capillary refill and no digital ulcerations or signs of gangrene were noted on examination today.  No active morphea lesions recently.  She continues to follow-up with her dermatologist in [facility] closely.  She has been taking methotrexate 5 tablets by mouth once weekly for the past 3 weeks which she reduced due to elevated creatinine and low white blood cell count.  She has not noticed any new or worsening symptoms on the reduced dose of methotrexate.  She avoids all NSAID use.  She has not had any symptoms of esophagitis, dysphagia, or constipation.  She has not had any shortness of breath or pleuritic chest pain.  PFTs and echocardiogram updated on 08/15/2020: Dr. Haroldine Laws reported normal spirometry with slightly decreased DLCO on 08/18/20.  High-resolution chest CT on 05/25/2016 did not reveal any evidence of ILD.  No crackles noted on examination today. She was advised to call to schedule yearly echocardiogram and updated PFTs.  She is advised to notify us if  she develops any new or worsening symptoms.  She will remain on methotrexate 5 tablets by mouth once weekly as prescribed by her dermatologist.  She will follow-up in the office in 3 months.  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 Scarring noted but no active lesions.  She is currently taking methotrexate 5 tablets by mouth once weekly as prescribed by her dermatologist.  She reduced the dose from 6 tablets to 5 tablets weekly 3 weeks ago due to elevated creatinine.  She will continue to require frequent lab monitoring.  She was advised to notify us if she develops any progression in her symptoms.  High risk medication use - Methotrexate 5 tablets by mouth once weekly and folic acid 2 mg po daily.  Prescribed by the dermatologist.  CMP updated on 07/03/2021 and results were reviewed with the patient.  CBC was drawn on 06/05/2021.  Discussed the importance of avoiding NSAID use. She will be due to update lab work in November and every 3 months to monitor for drug toxicity.  Standing orders for CBC and CMP remain in place.- Plan: CBC with Differential/Platelet, COMPLETE METABOLIC PANEL WITH GFR She is planning on receiving the COVID-19 booster and influenza vaccination.  She was advised to hold methotrexate for 1 week after the vaccine. She voiced understanding.   Pain in joint of right ankle - X-ray of the ankle joint  was unremarkable.  Posterior calcaneal spur was noted. Resolved. No tenderness or inflammation noted on exam today.   Recurrent acute iridocyclitis of both eyes: No recent flares. No conjunctival injection noted.  She was evaluated by Dr. Manuella Ghazi on 07/07/2021 and did not have any active inflammation at that time.  She was advised to schedule a follow-up visit in 6 months and remain on methotrexate as prescribed.   Raynaud's syndrome without gangrene: Not currently active. N digital ulcerations or signs of gangrene.  Good capillary refill noted on examination  today.  Discussed the importance of avoiding triggers especially keeping her lower body temperature warm, wearing gloves, and thick socks during winter.  Primary osteoarthritis of both knees: She has good ROM of both knee joints with no discomfort.  No warmth or effusion noted.   Other medical conditions are listed as follows:   Positive PPD, treated  History of migraine  Bilateral calcaneal spurs  History of hyperlipidemia  History of hypertension: BP was 96/64.    Orders: Orders Placed This Encounter  Procedures   CBC with Differential/Platelet   COMPLETE METABOLIC PANEL WITH GFR    No orders of the defined types were placed in this encounter.     Follow-Up Instructions: Return in about 3 months (around 10/28/2021) for Scleroderma .   Ofilia Neas, PA-C  Note - This record has been created using Dragon software.  Chart creation errors have been sought, but may not always  have been located. Such creation errors do not reflect on  the standard of medical care.

## 2021-07-27 DIAGNOSIS — Z1231 Encounter for screening mammogram for malignant neoplasm of breast: Secondary | ICD-10-CM | POA: Diagnosis not present

## 2021-07-27 LAB — HM MAMMOGRAPHY

## 2021-07-28 ENCOUNTER — Other Ambulatory Visit: Payer: Self-pay

## 2021-07-28 ENCOUNTER — Ambulatory Visit: Payer: Medicare Other | Admitting: Physician Assistant

## 2021-07-28 ENCOUNTER — Encounter: Payer: Self-pay | Admitting: Physician Assistant

## 2021-07-28 VITALS — BP 96/64 | HR 78 | Ht 66.0 in | Wt 171.6 lb

## 2021-07-28 DIAGNOSIS — Z79899 Other long term (current) drug therapy: Secondary | ICD-10-CM | POA: Diagnosis not present

## 2021-07-28 DIAGNOSIS — L940 Localized scleroderma [morphea]: Secondary | ICD-10-CM

## 2021-07-28 DIAGNOSIS — M349 Systemic sclerosis, unspecified: Secondary | ICD-10-CM | POA: Diagnosis not present

## 2021-07-28 DIAGNOSIS — M7731 Calcaneal spur, right foot: Secondary | ICD-10-CM

## 2021-07-28 DIAGNOSIS — R7611 Nonspecific reaction to tuberculin skin test without active tuberculosis: Secondary | ICD-10-CM

## 2021-07-28 DIAGNOSIS — M25571 Pain in right ankle and joints of right foot: Secondary | ICD-10-CM

## 2021-07-28 DIAGNOSIS — M170 Bilateral primary osteoarthritis of knee: Secondary | ICD-10-CM

## 2021-07-28 DIAGNOSIS — Z8679 Personal history of other diseases of the circulatory system: Secondary | ICD-10-CM

## 2021-07-28 DIAGNOSIS — I7300 Raynaud's syndrome without gangrene: Secondary | ICD-10-CM | POA: Diagnosis not present

## 2021-07-28 DIAGNOSIS — Z8639 Personal history of other endocrine, nutritional and metabolic disease: Secondary | ICD-10-CM

## 2021-07-28 DIAGNOSIS — H20023 Recurrent acute iridocyclitis, bilateral: Secondary | ICD-10-CM | POA: Diagnosis not present

## 2021-07-28 DIAGNOSIS — Z8669 Personal history of other diseases of the nervous system and sense organs: Secondary | ICD-10-CM | POA: Diagnosis not present

## 2021-07-28 DIAGNOSIS — M7732 Calcaneal spur, left foot: Secondary | ICD-10-CM

## 2021-07-28 NOTE — Patient Instructions (Signed)
Standing Labs We placed an order today for your standing lab work.   Please have your standing labs drawn in November and every 3 months   If possible, please have your labs drawn 2 weeks prior to your appointment so that the provider can discuss your results at your appointment.  Please note that you may see your imaging and lab results in [place] before we have reviewed them. We may be awaiting multiple results to interpret others before contacting you. Please allow our office up to 72 hours to thoroughly review all of the results before contacting the office for clarification of your results.  We have open lab daily: Monday through Thursday from 1:30-4:30 PM and Friday from 1:30-4:00 PM at the office of Dr. Bo Merino, [facility].   Please be advised, all patients with office appointments requiring lab work will take precedent over walk-in lab work.  If possible, please come for your lab work on Monday and Friday afternoons, as you may experience shorter wait times. The office is located at [address] [place], [place], [place] [address] No appointment is necessary.   Labs are drawn by [facility]. Please bring your co-pay at the time of your lab draw.  You may receive a bill from [facility] for your lab work.  If you wish to have your labs drawn at another location, please call the office 24 hours in advance to send orders.  If you have any questions regarding directions or hours of operation,  please call [phone].   As a reminder, please drink plenty of water prior to coming for your lab work. Thanks!

## 2021-08-24 DIAGNOSIS — R922 Inconclusive mammogram: Secondary | ICD-10-CM | POA: Diagnosis not present

## 2021-08-24 DIAGNOSIS — N6489 Other specified disorders of breast: Secondary | ICD-10-CM | POA: Diagnosis not present

## 2021-08-24 DIAGNOSIS — R928 Other abnormal and inconclusive findings on diagnostic imaging of breast: Secondary | ICD-10-CM | POA: Diagnosis not present

## 2021-08-24 LAB — HM MAMMOGRAPHY

## 2021-10-02 DIAGNOSIS — H2013 Chronic iridocyclitis, bilateral: Secondary | ICD-10-CM | POA: Diagnosis not present

## 2021-10-02 DIAGNOSIS — H40023 Open angle with borderline findings, high risk, bilateral: Secondary | ICD-10-CM | POA: Diagnosis not present

## 2021-10-02 DIAGNOSIS — H0102A Squamous blepharitis right eye, upper and lower eyelids: Secondary | ICD-10-CM | POA: Diagnosis not present

## 2021-10-02 DIAGNOSIS — H47011 Ischemic optic neuropathy, right eye: Secondary | ICD-10-CM | POA: Diagnosis not present

## 2021-10-02 DIAGNOSIS — H04123 Dry eye syndrome of bilateral lacrimal glands: Secondary | ICD-10-CM | POA: Diagnosis not present

## 2021-10-02 DIAGNOSIS — H40053 Ocular hypertension, bilateral: Secondary | ICD-10-CM | POA: Diagnosis not present

## 2021-10-02 DIAGNOSIS — H0102B Squamous blepharitis left eye, upper and lower eyelids: Secondary | ICD-10-CM | POA: Diagnosis not present

## 2021-10-02 DIAGNOSIS — H40043 Steroid responder, bilateral: Secondary | ICD-10-CM | POA: Diagnosis not present

## 2021-10-21 NOTE — Progress Notes (Signed)
Office Visit Note  Patient: Rhonda Buchanan             Date of Birth: 18-Nov-1945           MRN: [id number]             PCP: Denita Lung, MD Referring: Denita Lung, MD Visit Date: 11/03/2021 Occupation: @GUAROCC @  Subjective:  Pain in joints   History of Present Illness: Rhonda Buchanan is a 76 y.o. female with a history of scleroderma.  She states she had a flare of her skin disease recently after stopping month methotrexate for 2 weeks for COVID-19 vaccination.  She states the rash improved gradually.  She has been taking methotrexate 5 tablets p.o. weekly.  Only 1 week she took methotrexate 6 tablets p.o. weekly.  She has been getting labs followed by her dermatologist.  Raynauds is currently not active.  She has not had a recurrence of iridocyclitis.  She continues to have some stiffness at her knee joints.  Right ankle joint distal bothers her.  She was seen by Dr. Haroldine Laws in October 2021.  At that time she had echocardiogram which did not show any pulmonary hypertension.  She also had PFTs done.  DLCO was 70%.  She continued to have pain and discomfort in her right ankle.  Activities of Daily Living:  Patient reports morning stiffness for 5 minutes.   Patient Reports nocturnal pain.  Difficulty dressing/grooming: Denies Difficulty climbing stairs: Reports Difficulty getting out of chair: Reports Difficulty using hands for taps, buttons, cutlery, and/or writing: Reports  Review of Systems  Constitutional:  Positive for fatigue.  HENT:  Positive for mouth dryness. Negative for mouth sores and nose dryness.   Eyes:  Positive for dryness. Negative for pain and itching.  Respiratory:  Negative for shortness of breath and difficulty breathing.   Cardiovascular:  Negative for chest pain and palpitations.  Gastrointestinal:  Negative for blood in stool, constipation and diarrhea.  Endocrine: Negative for increased urination.  Genitourinary:  Negative for difficulty  urinating.  Musculoskeletal:  Positive for joint pain, joint pain and morning stiffness. Negative for joint swelling, myalgias, muscle tenderness and myalgias.  Skin:  Positive for color change. Negative for rash, redness and sensitivity to sunlight.  Allergic/Immunologic: Negative for susceptible to infections.  Neurological:  Positive for headaches. Negative for dizziness, numbness, memory loss and weakness.  Hematological:  Negative for bruising/bleeding tendency and swollen glands.  Psychiatric/Behavioral:  Negative for depressed mood, confusion and sleep disturbance. The patient is not nervous/anxious.    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   Atherosclerosis of coronary artery of native heart without angina pectoris 10/05/2016   Aortic stenosis 05/21/2016   Abnormal PFTs 05/21/2016   Asthma due to environmental allergies 06/24/2015   Positive PPD, treated 06/05/2015   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/14/2014   Scleroderma ([place]) 07/12/2013   Hypertension 03/04/2011   Hyperlipidemia 03/04/2011   Allergic rhinitis 03/04/2011   Arthritis 03/04/2011   Migraine variant 04/18/1995    Past Medical History:  Diagnosis Date   Allergy    Arthritis    Hypercholesterolemia    Hypertension    PUD (peptic ulcer disease)    Scleroderma ([place])     Family History  Problem Relation Age of Onset   Cancer Father    Ulcers Sister  High Cholesterol Sister    Ulcers Brother    Heart disease Brother 56       MI   Cancer Maternal Grandmother    Heart disease Maternal Grandfather    Past Surgical History:  Procedure Laterality Date   arthroscopy     CYST REMOVAL HAND     EYE SURGERY Left 10/25/2017   laser surgery    KNEE ARTHROPLASTY     Social History   Social History Narrative   Not on file   Immunization History   Administered Date(s) Administered   Fluad Quad(high Dose 65+) 12/19/2020, 09/29/2021   H1N1 08/22/2009   Influenza, High Dose Seasonal PF 06/24/2015, 10/05/2016, 08/04/2018   Influenza,inj,Quad PF,6+ Mos 07/12/2013, 01/15/2015   Influenza-Unspecified 06/24/2015, 10/05/2016, 12/24/2017, 08/04/2018   PFIZER(Purple Top)SARS-COV-2 Vaccination 12/13/2019, 01/08/2020, 10/03/2020   Pfizer Covid-19 Vaccine Bivalent Booster 50yrs & up 09/29/2021   Pneumococcal Conjugate-13 05/20/2014   Pneumococcal Polysaccharide-23 01/31/2007   Tdap 01/31/2007, 12/19/2020   Zoster, Live 07/02/2008     Objective: Vital Signs: BP 110/62 (BP Location: Left Arm, Patient Position: Sitting, Cuff Size: Normal)    Pulse 64    Ht 5\' 5"  (1.651 m)    Wt 167 lb 12.8 oz (76.1 kg)    LMP 02/16/2003    BMI 27.92 kg/m    Physical Exam Vitals and nursing note reviewed.  Constitutional:      Appearance: She is well-developed.  HENT:     Head: Normocephalic and atraumatic.  Eyes:     Conjunctiva/sclera: Conjunctivae normal.  Cardiovascular:     Rate and Rhythm: Normal rate and regular rhythm.     Heart sounds: Normal heart sounds.  Pulmonary:     Effort: Pulmonary effort is normal.     Breath sounds: Normal breath sounds.  Abdominal:     General: Bowel sounds are normal.     Palpations: Abdomen is soft.  Musculoskeletal:     Cervical back: Normal range of motion.  Lymphadenopathy:     Cervical: No cervical adenopathy.  Skin:    General: Skin is warm and dry.     Capillary Refill: Capillary refill takes less than 2 seconds.     Comments: No sclerodactyly, telangiectasias or nailbed capillary changes were noted.  Poikiloderma was noted.  Neurological:     Mental Status: She is alert and oriented to person, place, and time.  Psychiatric:        Behavior: Behavior normal.     Musculoskeletal Exam: C-spine was in good range of motion.  Shoulder joints, elbow joints, wrist joints, MCPs PIPs and DIPs with good range  of motion.  She had bilateral PIP and DIP thickening.  Hip joints were in good range of motion.  She had discomfort range of motion of bilateral knee joints.  No warmth swelling or effusion was noted.  She had no tenderness over ankles or MTPs.  CDAI Exam: CDAI Score: -- Patient Global: --; Provider Global: -- Swollen: --; Tender: -- Joint Exam 11/03/2021   No joint exam has been documented for this visit   There is currently no information documented on the homunculus. Go to the Rheumatology activity and complete the homunculus joint exam.  Investigation: No additional findings.  Imaging: No results found.  Recent Labs: Lab Results  Component Value Date   WBC 4.3 06/05/2021   HGB 12.9 06/05/2021   PLT 231 06/05/2021   NA 138 07/03/2021   K 3.1 (L) 07/03/2021   CL 97 07/03/2021   CO2  27 07/03/2021   GLUCOSE 103 (H) 07/03/2021   BUN 16 07/03/2021   CREATININE 1.54 (H) 07/03/2021   BILITOT 0.5 07/03/2021   ALKPHOS 73 07/03/2021   AST 25 07/03/2021   ALT 22 07/03/2021   PROT 6.6 07/03/2021   ALBUMIN 4.3 07/03/2021   CALCIUM 10.3 07/03/2021   GFRAA 62 08/01/2020      Speciality Comments: No specialty comments available.  Procedures:  No procedures performed Allergies: Ibuprofen   Assessment / Plan:     Visit Diagnoses: Scleroderma ([place]) - RF-, Anti-CCP-, ANA negative, - PanANCA, Hx of morphea, raynaud's, recurrent iridocyclitis: She had no sclerodactyly, nailbed capillary changes or telangiectasias.  Patient states that she held methotrexate for 2 weeks for COVID-19 vaccination.  She felt that she had flare of her morphea rash which improved after resuming methotrexate.  Morphea scleroderma - Followed closely by her dermatologist in [facility].  High risk medication use - Methotrexate 5 tablets by mouth once weekly and folic acid 2 mg po daily.  Prescribed by the dermatologist.  I reviewed her records and found following labs: [facility] health labs October 26, 2021 CBC WBC  4.8, hemoglobin 12.7, platelets 191, creatinine 1.26, GFR 44, ALT 23.  Patient gets labs with her dermatologist.  Pain in joint of right ankle - X-ray of the ankle joint was unremarkable.  Posterior calcaneal spur was noted.  She continues to have intermittent discomfort in her right ankle joint.  Recurrent acute iridocyclitis of both eyes - She was evaluated by Dr. Manuella Ghazi on 07/07/2021 and did not have any active inflammation at that time.  She denies any recurrence of iridocyclitis.  Raynaud's syndrome without gangrene-her symptoms are mild.  No nailbed capillary changes were noted.  Warm clothing and keeping core temperature warm was discussed.  Primary osteoarthritis of both knees-she continues to have some discomfort in her knee joints.  No warmth swelling or effusion was noted.  Lower extremity muscle strengthening exercises were discussed.  Positive PPD, treated  History of migraine  History of hyperlipidemia  Bilateral calcaneal spurs  History of hypertension-blood pressure was normal today.  She saw Dr. Haroldine Laws 2 years ago.  At that time some aortic atherosclerosis was noted.  She said she had no evidence of pulmonary hypertension.  DLCO was decreased.  Orders: No orders of the defined types were placed in this encounter.  No orders of the defined types were placed in this encounter.    Follow-Up Instructions: Return in about 6 months (around 05/03/2022) for Scleroderma, Osteoarthritis.   Bo Merino, MD  Note - This record has been created using Editor, commissioning.  Chart creation errors have been sought, but may not always  have been located. Such creation errors do not reflect on  the standard of medical care.

## 2021-10-26 DIAGNOSIS — Z79631 Long term (current) use of antimetabolite agent: Secondary | ICD-10-CM | POA: Diagnosis not present

## 2021-10-26 DIAGNOSIS — L940 Localized scleroderma [morphea]: Secondary | ICD-10-CM | POA: Diagnosis not present

## 2021-10-26 DIAGNOSIS — Z79899 Other long term (current) drug therapy: Secondary | ICD-10-CM | POA: Diagnosis not present

## 2021-11-03 ENCOUNTER — Encounter: Payer: Self-pay | Admitting: Rheumatology

## 2021-11-03 ENCOUNTER — Other Ambulatory Visit: Payer: Self-pay

## 2021-11-03 ENCOUNTER — Ambulatory Visit: Payer: Medicare Other | Admitting: Rheumatology

## 2021-11-03 VITALS — BP 110/62 | HR 64 | Ht 65.0 in | Wt 167.8 lb

## 2021-11-03 DIAGNOSIS — M349 Systemic sclerosis, unspecified: Secondary | ICD-10-CM | POA: Diagnosis not present

## 2021-11-03 DIAGNOSIS — M25571 Pain in right ankle and joints of right foot: Secondary | ICD-10-CM | POA: Diagnosis not present

## 2021-11-03 DIAGNOSIS — M7731 Calcaneal spur, right foot: Secondary | ICD-10-CM | POA: Diagnosis not present

## 2021-11-03 DIAGNOSIS — L940 Localized scleroderma [morphea]: Secondary | ICD-10-CM | POA: Diagnosis not present

## 2021-11-03 DIAGNOSIS — M7732 Calcaneal spur, left foot: Secondary | ICD-10-CM

## 2021-11-03 DIAGNOSIS — Z79899 Other long term (current) drug therapy: Secondary | ICD-10-CM | POA: Diagnosis not present

## 2021-11-03 DIAGNOSIS — M170 Bilateral primary osteoarthritis of knee: Secondary | ICD-10-CM | POA: Diagnosis not present

## 2021-11-03 DIAGNOSIS — Z8669 Personal history of other diseases of the nervous system and sense organs: Secondary | ICD-10-CM | POA: Diagnosis not present

## 2021-11-03 DIAGNOSIS — H20023 Recurrent acute iridocyclitis, bilateral: Secondary | ICD-10-CM

## 2021-11-03 DIAGNOSIS — Z8679 Personal history of other diseases of the circulatory system: Secondary | ICD-10-CM | POA: Diagnosis not present

## 2021-11-03 DIAGNOSIS — Z8639 Personal history of other endocrine, nutritional and metabolic disease: Secondary | ICD-10-CM | POA: Diagnosis not present

## 2021-11-03 DIAGNOSIS — I7300 Raynaud's syndrome without gangrene: Secondary | ICD-10-CM

## 2021-11-03 DIAGNOSIS — R7611 Nonspecific reaction to tuberculin skin test without active tuberculosis: Secondary | ICD-10-CM

## 2021-11-03 NOTE — Patient Instructions (Signed)
Please schedule a follow-up appointment with Dr. Haroldine Laws.

## 2021-11-09 ENCOUNTER — Telehealth (HOSPITAL_COMMUNITY): Payer: Self-pay | Admitting: *Deleted

## 2021-11-09 NOTE — Telephone Encounter (Signed)
Pt left vm asking if she can have her 2 year follow up and pfts before October. Pt said she feels like she may be having some pulmonary issues and asked to be scheduled for next available appt for both pfts and office visit.   Routed to [place]  ([place] to schedule?)

## 2021-11-10 ENCOUNTER — Other Ambulatory Visit (HOSPITAL_COMMUNITY): Payer: Self-pay | Admitting: *Deleted

## 2021-11-10 DIAGNOSIS — M349 Systemic sclerosis, unspecified: Secondary | ICD-10-CM

## 2022-01-28 ENCOUNTER — Ambulatory Visit (INDEPENDENT_AMBULATORY_CARE_PROVIDER_SITE_OTHER): Payer: Medicare Other | Admitting: Family Medicine

## 2022-01-28 ENCOUNTER — Encounter: Payer: Self-pay | Admitting: Family Medicine

## 2022-01-28 VITALS — BP 128/70 | HR 66 | Temp 97.0°F | Wt 165.6 lb

## 2022-01-28 DIAGNOSIS — I700 Atherosclerosis of aorta: Secondary | ICD-10-CM

## 2022-01-28 DIAGNOSIS — M25473 Effusion, unspecified ankle: Secondary | ICD-10-CM | POA: Diagnosis not present

## 2022-01-28 DIAGNOSIS — M349 Systemic sclerosis, unspecified: Secondary | ICD-10-CM | POA: Diagnosis not present

## 2022-01-28 NOTE — Progress Notes (Signed)
? ?  Subjective:  ? ? 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   MRN: [id number] ? ?HPI ?She complains of a 14-monthhistory of swelling in both ankles but not in the feet.  No DOE, PND, other joints involved.  No joint pain or tenderness.  She does have a history of scleroderma.  She is also recently been evaluated by peripheral vascular and does have evidence of aortic atherosclerosis.  She is on a statin drug. ? ? ?Review of Systems ? ?   ?Objective:  ? Physical Exam ?Exam of both feet does show no pitting edema but definitely swelling in the area of the ankles both medial and lateral.  Pulses are normal.  Exam of her toes, wrists, elbows and knees was all normal with no evidence of swelling. ? ? ? ?   ?Assessment & Plan:  ?Ankle swelling, unspecified laterality ? ?[place]a ([place] ? ?Aortic atherosclerosis ([place] ?I explained that since she is really not having any difficulty with that I would not pursue any further intervention or treatment.  It does seem to be localized to that area but no true evidence of arthritis other than the swelling. ? ?

## 2022-02-25 IMAGING — US US AORTA
1 series · 14 of 25 positions shown · non-contrast
Comparison: 08/11/2020

CLINICAL DATA: Abdominal bruit

EXAM:
ULTRASOUND OF ABDOMINAL AORTA
TECHNIQUE: Ultrasound examination of the abdominal aorta and proximal common
iliac arteries was performed to evaluate for aneurysm. Additional
color and Doppler images of the distal aorta were obtained to
document patency.

[Series 1: us aorta · 0.22mm/px · 14 of 31 slices shown]
[im 1/31]
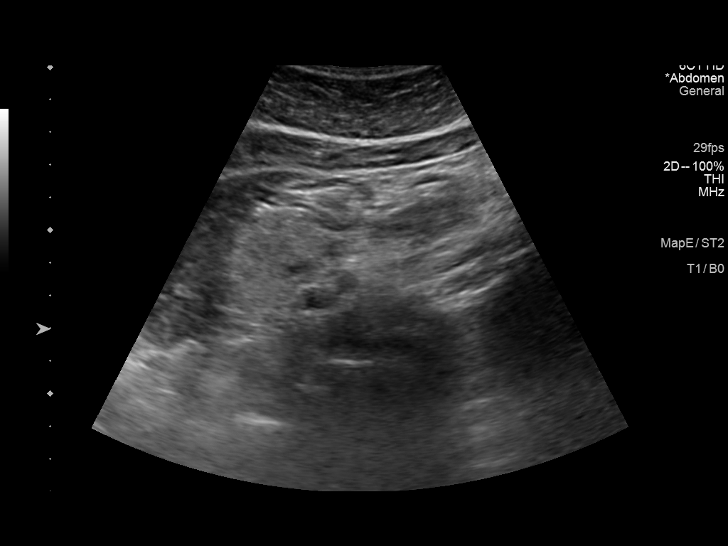
[im 3/31]
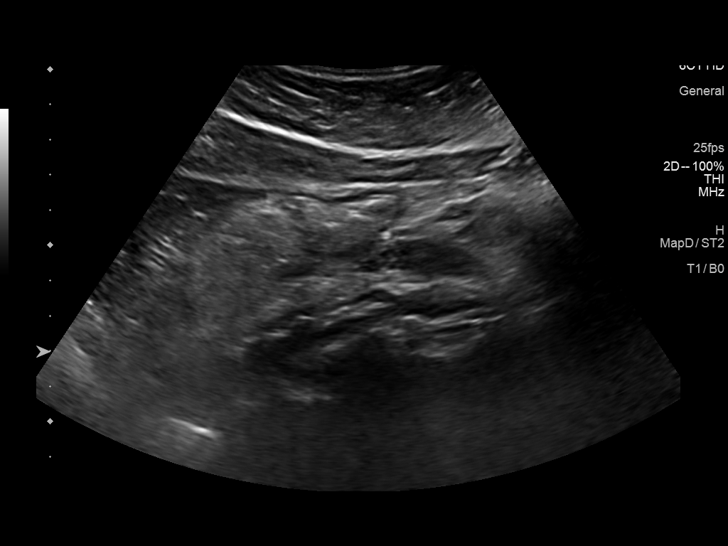
[im 6/31]
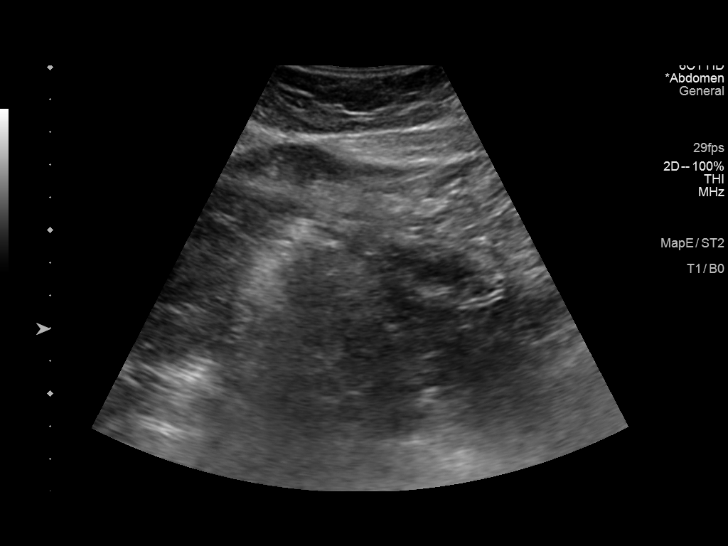
[im 8/31]
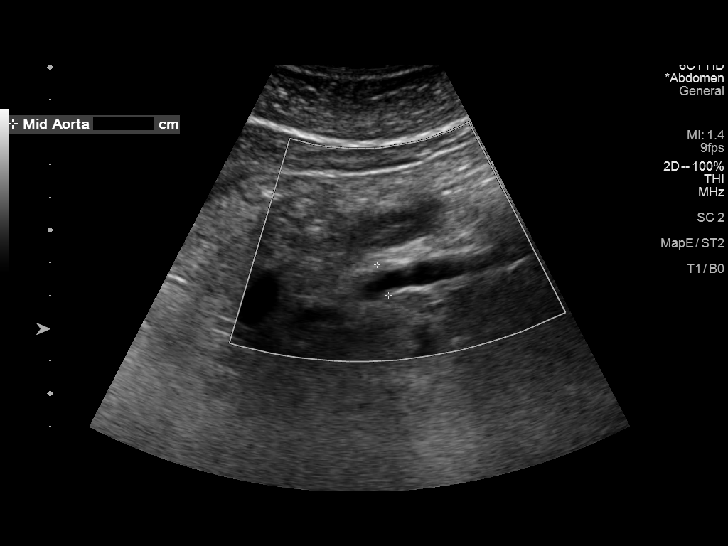
[im 11/31]
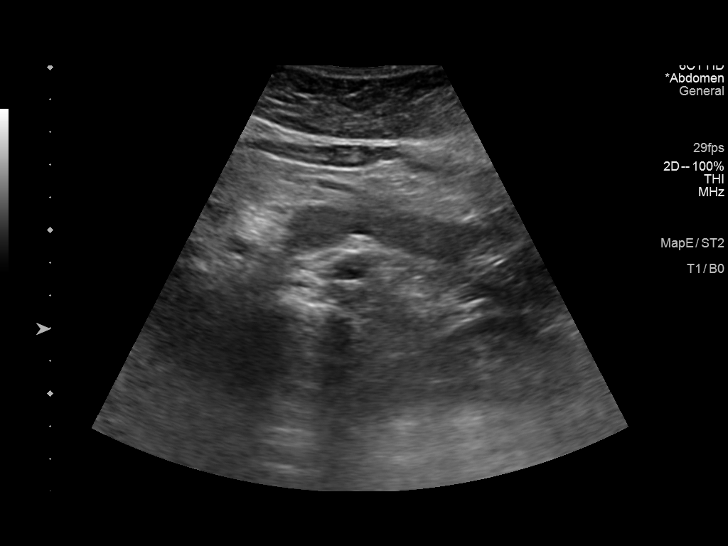
[im 12/31]
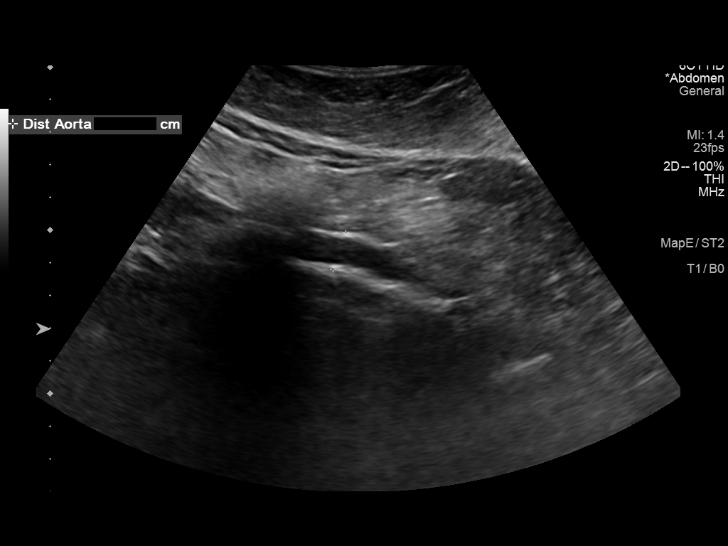
[im 14/31]
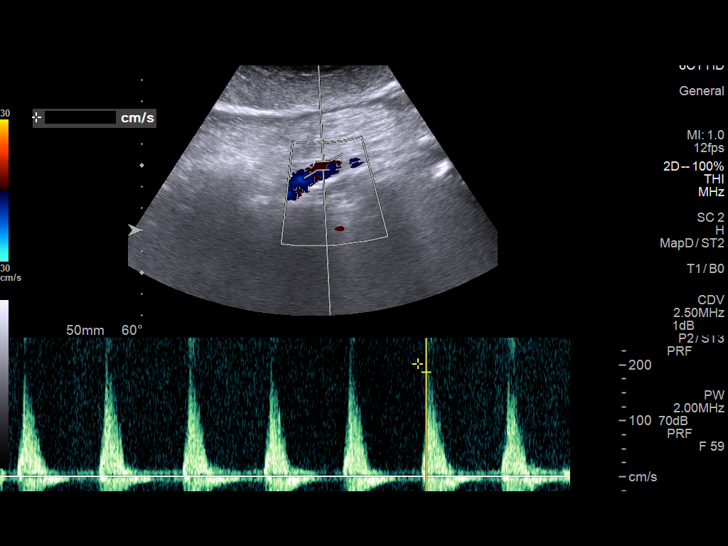
[im 17/31]
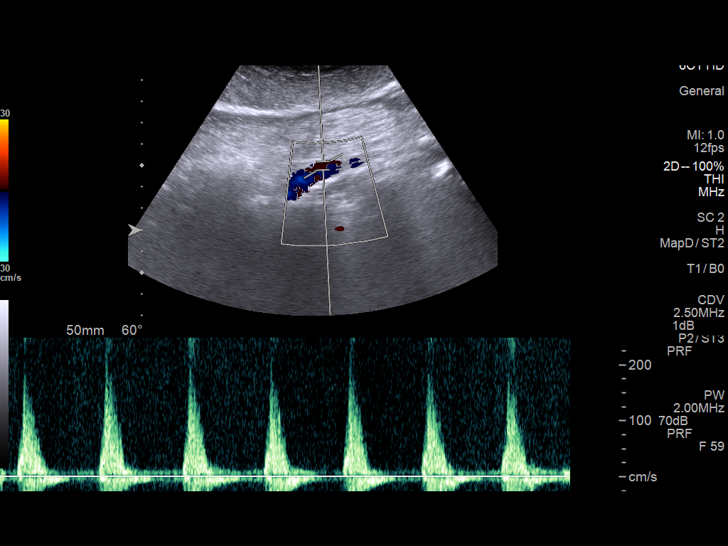
[im 19/31]
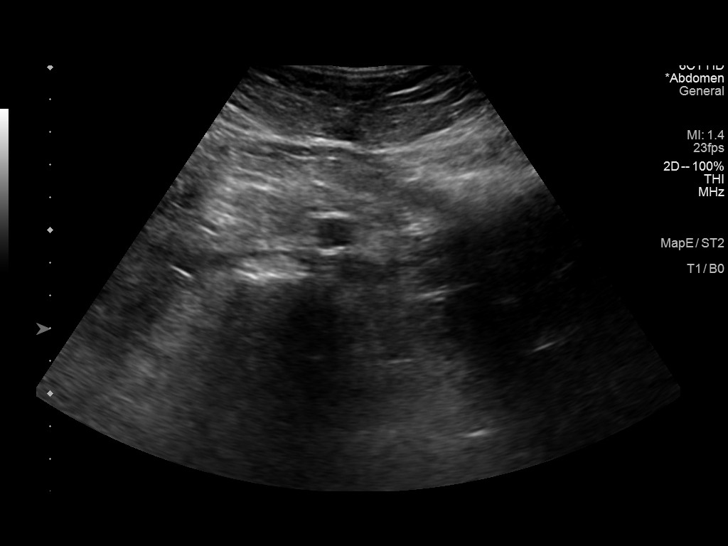
[im 21/31]
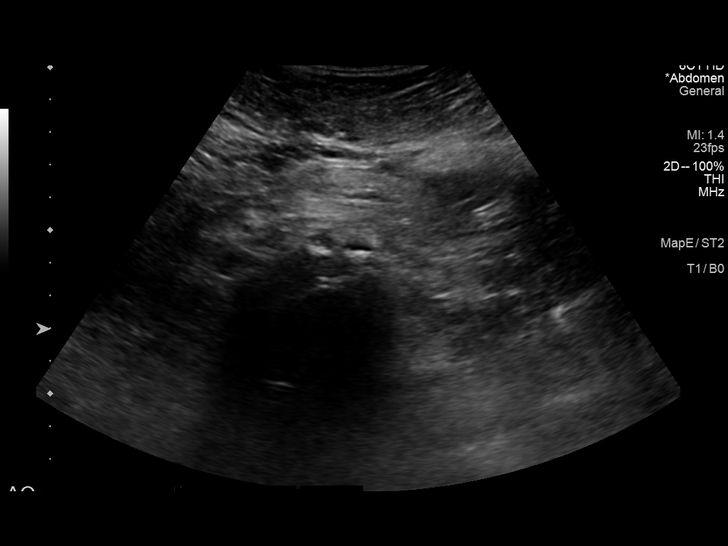
[im 23/31]
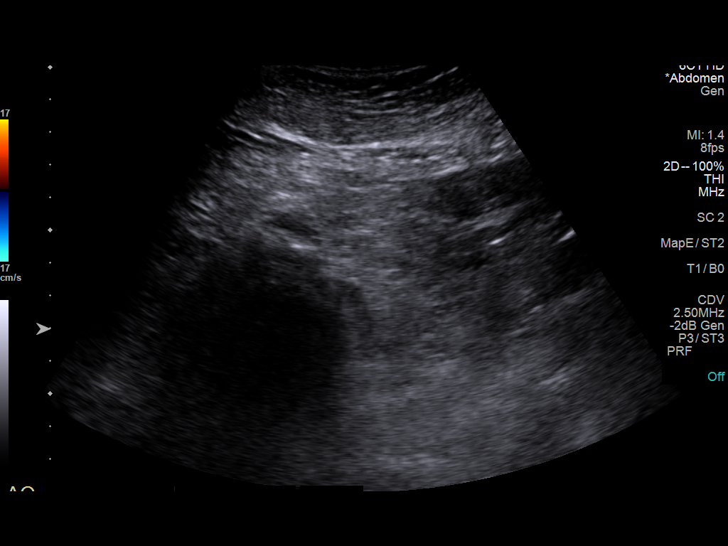
[im 26/31]
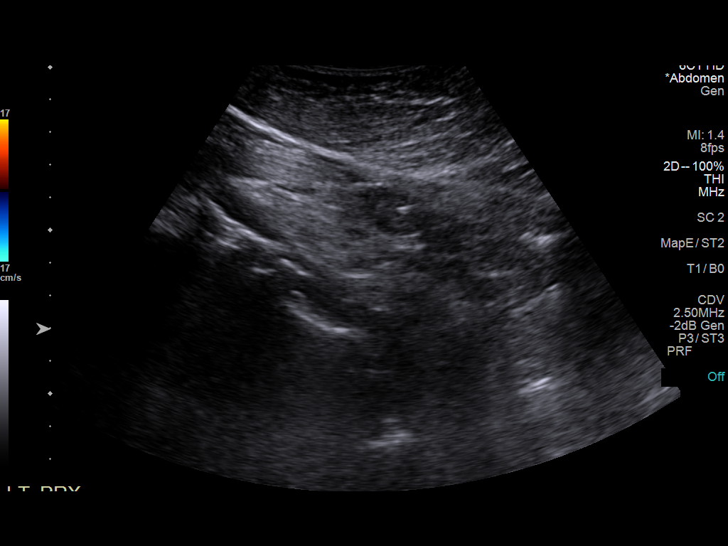
[im 28/31]
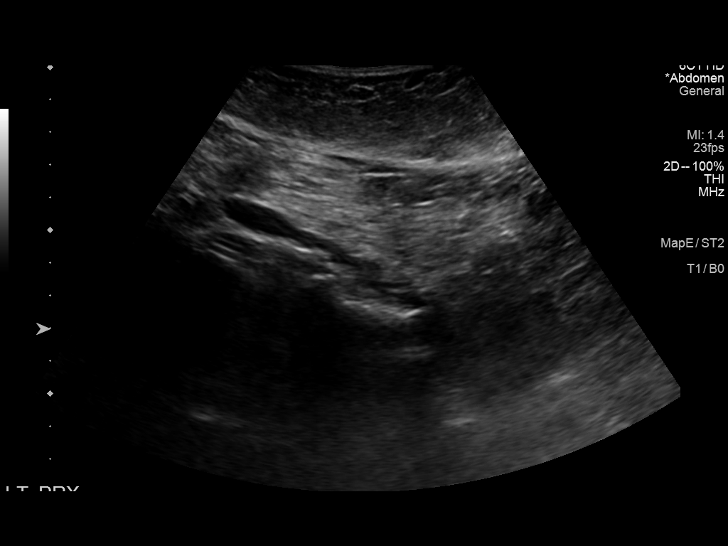
[im 31/31]
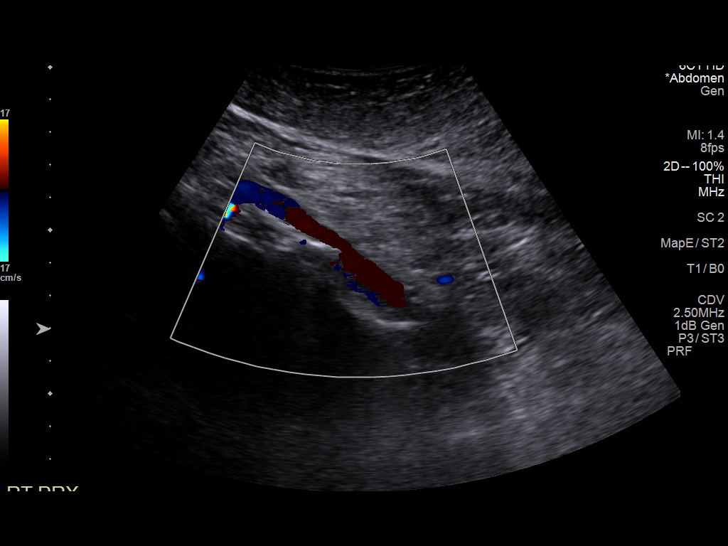

[14 of 25 positions shown; findings below may reference images not displayed]

FINDINGS: There is diffuse atherosclerosis throughout the aorta. Abdominal
aortic measurements as follows:

Proximal:  1.1 cm cm

Mid:  1.0 cm cm

Distal:  1.2 cm cm
Patent: Yes, peak systolic velocity is 187 cm/s

Right common iliac artery: 0.6 cm

Left common iliac artery: 0.7 cm
IMPRESSION: 1. No evidence of abdominal aortic aneurysm.
2.  Aortic Atherosclerosis ([id number]).

## 2022-03-01 DIAGNOSIS — Z79899 Other long term (current) drug therapy: Secondary | ICD-10-CM | POA: Diagnosis not present

## 2022-03-01 DIAGNOSIS — L84 Corns and callosities: Secondary | ICD-10-CM | POA: Diagnosis not present

## 2022-03-01 DIAGNOSIS — H209 Unspecified iridocyclitis: Secondary | ICD-10-CM | POA: Diagnosis not present

## 2022-03-01 DIAGNOSIS — L940 Localized scleroderma [morphea]: Secondary | ICD-10-CM | POA: Diagnosis not present

## 2022-04-07 ENCOUNTER — Other Ambulatory Visit: Payer: Self-pay | Admitting: Family Medicine

## 2022-04-07 DIAGNOSIS — I10 Essential (primary) hypertension: Secondary | ICD-10-CM

## 2022-04-13 DIAGNOSIS — H43823 Vitreomacular adhesion, bilateral: Secondary | ICD-10-CM | POA: Diagnosis not present

## 2022-04-13 DIAGNOSIS — H209 Unspecified iridocyclitis: Secondary | ICD-10-CM | POA: Diagnosis not present

## 2022-04-13 DIAGNOSIS — Z961 Presence of intraocular lens: Secondary | ICD-10-CM | POA: Diagnosis not present

## 2022-04-13 DIAGNOSIS — H04129 Dry eye syndrome of unspecified lacrimal gland: Secondary | ICD-10-CM | POA: Diagnosis not present

## 2022-04-16 DIAGNOSIS — H2013 Chronic iridocyclitis, bilateral: Secondary | ICD-10-CM | POA: Diagnosis not present

## 2022-04-16 DIAGNOSIS — H04123 Dry eye syndrome of bilateral lacrimal glands: Secondary | ICD-10-CM | POA: Diagnosis not present

## 2022-04-16 DIAGNOSIS — H47011 Ischemic optic neuropathy, right eye: Secondary | ICD-10-CM | POA: Diagnosis not present

## 2022-04-16 DIAGNOSIS — H40023 Open angle with borderline findings, high risk, bilateral: Secondary | ICD-10-CM | POA: Diagnosis not present

## 2022-04-16 DIAGNOSIS — H40043 Steroid responder, bilateral: Secondary | ICD-10-CM | POA: Diagnosis not present

## 2022-04-16 DIAGNOSIS — H40053 Ocular hypertension, bilateral: Secondary | ICD-10-CM | POA: Diagnosis not present

## 2022-04-22 ENCOUNTER — Ambulatory Visit (INDEPENDENT_AMBULATORY_CARE_PROVIDER_SITE_OTHER): Payer: Medicare Other | Admitting: Family Medicine

## 2022-04-22 ENCOUNTER — Encounter: Payer: Self-pay | Admitting: Family Medicine

## 2022-04-22 VITALS — BP 138/70 | HR 70 | Temp 97.2°F | Ht 65.0 in | Wt 167.0 lb

## 2022-04-22 DIAGNOSIS — Z0000 Encounter for general adult medical examination without abnormal findings: Secondary | ICD-10-CM

## 2022-04-22 DIAGNOSIS — R32 Unspecified urinary incontinence: Secondary | ICD-10-CM

## 2022-04-22 DIAGNOSIS — I2510 Atherosclerotic heart disease of native coronary artery without angina pectoris: Secondary | ICD-10-CM

## 2022-04-22 DIAGNOSIS — I739 Peripheral vascular disease, unspecified: Secondary | ICD-10-CM

## 2022-04-22 DIAGNOSIS — M1990 Unspecified osteoarthritis, unspecified site: Secondary | ICD-10-CM | POA: Diagnosis not present

## 2022-04-22 DIAGNOSIS — I10 Essential (primary) hypertension: Secondary | ICD-10-CM | POA: Diagnosis not present

## 2022-04-22 DIAGNOSIS — J301 Allergic rhinitis due to pollen: Secondary | ICD-10-CM | POA: Diagnosis not present

## 2022-04-22 DIAGNOSIS — I700 Atherosclerosis of aorta: Secondary | ICD-10-CM

## 2022-04-22 DIAGNOSIS — E785 Hyperlipidemia, unspecified: Secondary | ICD-10-CM

## 2022-04-22 DIAGNOSIS — L940 Localized scleroderma [morphea]: Secondary | ICD-10-CM | POA: Diagnosis not present

## 2022-04-22 DIAGNOSIS — I350 Nonrheumatic aortic (valve) stenosis: Secondary | ICD-10-CM

## 2022-04-22 LAB — POCT URINALYSIS DIPSTICK
Bilirubin, UA: NEGATIVE
Glucose, UA: NEGATIVE
Ketones, UA: NEGATIVE
Nitrite, UA: NEGATIVE
Protein, UA: POSITIVE — AB
Spec Grav, UA: 1.01 (ref 1.010–1.025)
Urobilinogen, UA: 1 E.U./dL
pH, UA: 7 (ref 5.0–8.0)

## 2022-04-22 MED ORDER — LISINOPRIL-HYDROCHLOROTHIAZIDE 10-12.5 MG PO TABS: 1.0000 | ORAL_TABLET | Freq: Every day | ORAL | 3 refills | Status: DC

## 2022-04-22 MED ORDER — ROSUVASTATIN CALCIUM 40 MG PO TABS: 40.0000 mg | ORAL_TABLET | Freq: Every day | ORAL | 3 refills | Status: DC

## 2022-04-22 NOTE — Progress Notes (Addendum)
Rhonda Buchanan is a 76 y.o. female who presents for annual wellness visit,CPE and follow-up on chronic medical conditions.  She has had intermittent difficulty with incontinence and urgency but no fever, chills, dysuria.  Review of the record indicates she does need shingles vaccine.  She continues to be followed by cardiology for her underlying aortic atherosclerosis, coronary artery disease and aortic stenosis.  She continues on Crestor and is having no difficulty with that.  She does have underlying PAD but at this point is not having any difficulty with that.  She sees dermatology for her scleroderma and continues on methotrexate as well as folic acid for that.  Her allergies seem to be under good control.  She does complain of generalized arthritis but blames this mainly on the scleroderma.  Otherwise she has no particular concerns or complaints.  Immunizations and Health Maintenance Immunization History  Administered Date(s) Administered   Fluad Quad(high Dose 65+) 12/19/2020, 09/29/2021   H1N1 08/22/2009   Influenza, High Dose Seasonal PF 06/24/2015, 10/05/2016, 08/04/2018   Influenza,inj,Quad PF,6+ Mos 07/12/2013, 01/15/2015   Influenza-Unspecified 06/24/2015, 10/05/2016, 12/24/2017, 08/04/2018   PFIZER(Purple Top)SARS-COV-2 Vaccination 12/13/2019, 01/08/2020, 10/03/2020   Pfizer Covid-19 Vaccine Bivalent Booster 3yr & up 09/29/2021   Pneumococcal Conjugate-13 05/20/2014   Pneumococcal Polysaccharide-23 01/31/2007   Tdap 01/31/2007, 12/19/2020   Zoster, Live 07/02/2008   Health Maintenance Due  Topic Date Due   Zoster Vaccines- Shingrix (1 of 2) Never done    Last Pap smear: aged out  Last mammogram: 08/24/21 Last colonoscopy: 05/13/20 cologuard  Last DEXA: 10/16/2013 Dentist: Q six months  Ophtho: Q six months Exercise: N/A  Other doctors caring for patient include: Dr. SManuella Ghazi Dr. GLorna Few PCoolidgerheumatology            Dr. BHaroldine Lawscardiology              Dr. SLondon Sheerdermatology  Advanced directives: Does Patient Have a Medical Advance Directive?: Yes Type of Advance Directive: Living will Does patient want to make changes to medical advance directive?: No - Patient declined Would patient like information on creating a medical advance directive?: No - Patient declined  Depression screen:  See questionnaire below.     04/22/2022   10:06 AM 04/15/2021   10:05 AM 04/14/2020    9:55 AM 12/21/2018   10:52 AM 10/19/2017   11:11 AM  Depression screen PHQ 2/9  Decreased Interest 0 0 0 3 0  Down, Depressed, Hopeless 0 0 0 1 0  PHQ - 2 Score 0 0 0 4 0  Altered sleeping    3   Tired, decreased energy    3   Change in appetite    0   Feeling bad or failure about yourself     0   Trouble concentrating    3   Moving slowly or fidgety/restless    0   Suicidal thoughts    0   PHQ-9 Score    13   Difficult doing work/chores    Somewhat difficult     Fall Risk Screen: see questionnaire below.    04/22/2022   10:05 AM 04/15/2021   10:04 AM 04/14/2020    9:55 AM 12/21/2018   10:51 AM 10/19/2017   11:11 AM  Fall Risk   Falls in the past year? 0 0 0 0 No  Number falls in past yr: 0 0     Injury with  Fall? 0 0     Risk for fall due to : No Fall Risks No Fall Risks     Follow up Falls evaluation completed Falls evaluation completed       ADL screen:  See questionnaire below Functional Status Survey: Is the patient deaf or have difficulty hearing?: No Does the patient have difficulty seeing, even when wearing glasses/contacts?: No Does the patient have difficulty concentrating, remembering, or making decisions?: No Does the patient have difficulty walking or climbing stairs?: Yes Does the patient have difficulty dressing or bathing?: No Does the patient have difficulty doing errands alone such as visiting a doctor's office or shopping?: No   Review of Systems Constitutional: -, -unexpected weight change, -anorexia, -fatigue Allergy: -sneezing,  -itching, -congestion Dermatology: denies changing moles, rash, lumps ENT: -runny nose, -ear pain, -sore throat,  Cardiology:  -chest pain, -palpitations, -orthopnea, Respiratory: -cough, -shortness of breath, -dyspnea on exertion, -wheezing,  Gastroenterology: -abdominal pain, -nausea, -vomiting, -diarrhea, -constipation, -dysphagia Hematology: -bleeding or bruising problems Musculoskeletal: -arthralgias, -myalgias, -joint swelling, -back pain, - Ophthalmology: -vision changes,  Urology: -dysuria, -difficulty urinating,  -urinary frequency, -urgency, incontinence Neurology: -, -numbness, , -memory loss, -falls, -dizziness  The 10-year ASCVD risk score (Arnett DK, et al., 2019) is: 19%   Values used to calculate the score:     Age: 19 years     Sex: Female     Is Non-Hispanic African American: Yes     Diabetic: No     Tobacco smoker: No     Systolic Blood Pressure: 756 mmHg     Is BP treated: Yes     HDL Cholesterol: 58 mg/dL     Total Cholesterol: 201 mg/dL   PHYSICAL EXAM:  BP 138/70   Pulse 70   Temp (!) 97.2 F (36.2 C)   Ht '5\' 5"'$  (1.651 m)   Wt 167 lb (75.8 kg)   LMP 02/16/2003   SpO2 97%   BMI 27.79 kg/m   General Appearance: Alert, cooperative, no distress, appears stated age Head: Normocephalic, without obvious abnormality, atraumatic Eyes: PERRL, conjunctiva/corneas clear, EOM's intact, Ears: Normal TM's and external ear canals Nose: Nares normal, mucosa normal, no drainage or sinus tenderness Throat: Lips, mucosa, and tongue normal; teeth and gums normal Neck: Supple, no lymphadenopathy;  thyroid:  no enlargement/tenderness/nodules; no carotid bruit or JVD Lungs: Clear to auscultation bilaterally without wheezes, rales or ronchi; respirations unlabored Heart: Regular rate and rhythm, S1 and S2 normal, 2/6 murmur, no rub or gallop Abdomen: Soft, non-tender, nondistended, normoactive bowel sounds,  no masses, no hepatosplenomegaly Lymph nodes: Cervical,  supraclavicular, and axillary nodes normal Neurologic:  CNII-XII intact, normal strength, sensation and gait; reflexes 2+ and symmetric throughout Psych: Normal mood, affect, hygiene and grooming. Urine microscopic was contaminated with very few red cells noted. ASSESSMENT/PLAN: Routine general medical examination at a health care facility  Aortic atherosclerosis ([place])  Nonrheumatic aortic valve stenosis  Atherosclerosis of coronary artery of native heart without angina pectoris, unspecified vessel or lesion type - Plan: rosuvastatin (CRESTOR) 40 MG tablet  Primary hypertension  Allergic rhinitis due to pollen, unspecified seasonality  Hyperlipidemia, unspecified hyperlipidemia type - Plan: Lipid panel, rosuvastatin (CRESTOR) 40 MG tablet  Arthritis  Generalized morphea  PAD (peripheral artery disease) ([place])  Essential hypertension - Plan: lisinopril-hydrochlorothiazide (ZESTORETIC) 10-12.5 MG tablet  Urinary incontinence, unspecified type - Plan: Urinalysis Dipstick She will continue to be followed by her other subspecialists.  Discussed  at least 30 minutes of aerobic activity at least 5  days/week and weight-bearing exercise 2x/week; reviewed;    Immunization recommendations discussed.  Colonoscopy recommendations reviewed   Medicare Attestation I have personally reviewed: The patient's medical and social history Their use of alcohol, tobacco or illicit drugs Their current medications and supplements The patient's functional ability including ADLs,fall risks, home safety risks, cognitive, and hearing and visual impairment Diet and physical activities Evidence for depression or mood disorders  The patient's weight, height, and BMI have been recorded in the chart.  I have made referrals, counseling, and provided education to the patient based on review of the above and I have provided the patient with a written personalized care plan for preventive services.     Jill Alexanders, MD   04/22/2022

## 2022-04-22 NOTE — Progress Notes (Signed)
Office Visit Note  Patient: Rhonda Buchanan             Date of Birth: 1945-11-07           MRN: [id number]             PCP: Ronnald Nian, MD Referring: Ronnald Nian, MD Visit Date: 05/04/2022 Occupation: @GUAROCC @  Subjective:  Medication monitoring  History of Present Illness: Rhonda M Mcclatchy is a 76 y.o. female with history of morphea scleroderma and osteoarthritis. She is taking methotrexate 5 tablets by mouth once weekly and folic acid 1 mg daily prescribed by her dermatologist, Dr. Janyth Contes.  She was last seen by her dermatologist on 03/01/2022 and was encouraged to remain on the current dose of methotrexate.  She previously had tried reducing the dose of methotrexate to 4 tablets weekly but developed a morphea flare.  She denies any new skin lesions and has consistently been taking methotrexate 5 tablets weekly since her office visit on 03/01/2022.  She denies any recent or recurrent infections.  Her symptoms of Raynaud's have been less frequent with warmer weather temperatures.  She denies any digital ulcerations.  Patient was previously evaluated by Dr. Gala Romney and had no evidence of PAH.  She has noticed ongoing swelling in both ankles.  She states at night she has had intermittent pain on the top of her right foot.  She denies any other joint pain or joint swelling. She is planning on receiving the shingrix vaccine. She denies any recent or recurrent infections.    Activities of Daily Living:  Patient reports morning stiffness for a few minutes.   Patient Reports nocturnal pain.  Difficulty dressing/grooming: Denies Difficulty climbing stairs: Reports Difficulty getting out of chair: Reports Difficulty using hands for taps, buttons, cutlery, and/or writing: Denies  Review of Systems  Constitutional:  Positive for fatigue.  HENT:  Negative for mouth sores and mouth dryness.   Eyes:  Positive for dryness.  Respiratory:  Negative for shortness of breath.    Cardiovascular:  Negative for chest pain and palpitations.  Gastrointestinal:  Negative for blood in stool, constipation and diarrhea.  Endocrine: Negative for increased urination.  Genitourinary:  Negative for involuntary urination.  Musculoskeletal:  Positive for joint pain, joint pain, joint swelling and morning stiffness. Negative for myalgias, muscle weakness, muscle tenderness and myalgias.  Skin:  Positive for color change. Negative for rash, hair loss and sensitivity to sunlight.  Allergic/Immunologic: Negative for susceptible to infections.  Neurological:  Positive for headaches. Negative for dizziness.  Hematological:  Negative for swollen glands.  Psychiatric/Behavioral:  Negative for depressed mood and sleep disturbance. The patient is not nervous/anxious.     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   Scleroderma (HCC) 07/12/2013   Hypertension 03/04/2011   Hyperlipidemia 03/04/2011   Allergic rhinitis 03/04/2011   Arthritis 03/04/2011   Migraine variant 04/18/1995    Past Medical History:  Diagnosis Date   Allergy    Arthritis    Hypercholesterolemia    Hypertension  PUD (peptic ulcer disease)    Scleroderma (HCC)     Family History  Problem Relation Age of Onset   Cancer Father    Ulcers Sister    High Cholesterol Sister    Ulcers Brother    Heart disease Brother 69       MI   Cancer Maternal Grandmother    Heart disease Maternal Grandfather    Past Surgical History:  Procedure  Laterality Date   arthroscopy     CYST REMOVAL HAND     EYE SURGERY Left 10/25/2017   laser surgery    KNEE ARTHROPLASTY     Social History   Social History Narrative   Not on file   Immunization History  Administered Date(s) Administered   Fluad Quad(high Dose 65+) 12/19/2020, 09/29/2021   H1N1 08/22/2009   Influenza, High Dose Seasonal PF 06/24/2015, 10/05/2016, 08/04/2018   Influenza,inj,Quad PF,6+ Mos 07/12/2013, 01/15/2015   Influenza-Unspecified 06/24/2015, 10/05/2016, 12/24/2017, 08/04/2018   PFIZER(Purple Top)SARS-COV-2 Vaccination 12/13/2019, 01/08/2020, 10/03/2020   Pfizer Covid-19 Vaccine Bivalent Booster 66yrs & up 09/29/2021   Pneumococcal Conjugate-13 05/20/2014   Pneumococcal Polysaccharide-23 01/31/2007   Tdap 01/31/2007, 12/19/2020   Zoster, Live 07/02/2008     Objective: Vital Signs: BP 100/62 (BP Location: Right Arm, Patient Position: Sitting, Cuff Size: Normal)   Pulse 65   Ht 5\' 5"  (1.651 m)   Wt 170 lb (77.1 kg)   LMP 02/16/2003   BMI 28.29 kg/m    Physical Exam Vitals and nursing note reviewed.  Constitutional:      Appearance: She is well-developed.  HENT:     Head: Normocephalic and atraumatic.  Eyes:     Conjunctiva/sclera: Conjunctivae normal.  Cardiovascular:     Rate and Rhythm: Normal rate and regular rhythm.     Heart sounds: Normal heart sounds.  Pulmonary:     Effort: Pulmonary effort is normal.     Breath sounds: Normal breath sounds.  Abdominal:     General: Bowel sounds are normal.     Palpations: Abdomen is soft.  Musculoskeletal:     Cervical back: Normal range of motion.  Skin:    General: Skin is warm and dry.     Capillary Refill: Capillary refill takes less than 2 seconds.     Comments: No sclerodactyly, telangiectasias or nailbed capillary changes were noted.  Poikiloderma was noted.   Neurological:     Mental Status: She is alert and oriented to person, place, and time.  Psychiatric:        Behavior: Behavior  normal.      Musculoskeletal Exam: C-spine, thoracic spine, and lumbar spine good ROM.  Shoulder joints, elbow joints, wrist joints, MCPs, PIPs, and DIPs good ROM with no synovitis.  Complete fist formation bilaterally.  PIP and DIP thickening noted. Hip joints, knee joints, and ankle joints have good ROM with no discomfort.  No tenderness or synovitis of ankle joints.  No tenderness over MTP joints.  Hammertoes and overcrowding of toes noted.   CDAI Exam: CDAI Score: -- Patient Global: --; Provider Global: -- Swollen: --; Tender: -- Joint Exam 05/04/2022   No joint exam has been documented for this visit   There is currently no information documented on the homunculus. Go to the Rheumatology activity and complete the homunculus joint exam.  Investigation: No additional findings.  Imaging: No results found.  Recent Labs: Lab Results  Component Value Date   WBC 4.3 06/05/2021   HGB 12.9 06/05/2021   PLT 231  06/05/2021   NA 138 07/03/2021   K 3.1 (L) 07/03/2021   CL 97 07/03/2021   CO2 27 07/03/2021   GLUCOSE 103 (H) 07/03/2021   BUN 16 07/03/2021   CREATININE 1.54 (H) 07/03/2021   BILITOT 0.5 07/03/2021   ALKPHOS 73 07/03/2021   AST 25 07/03/2021   ALT 22 07/03/2021   PROT 6.6 07/03/2021   ALBUMIN 4.3 07/03/2021   CALCIUM 10.3 07/03/2021   GFRAA 62 08/01/2020    Speciality Comments: No specialty comments available.  Procedures:  No procedures performed Allergies: Aspirin and Ibuprofen       Assessment / Plan:     Visit Diagnoses: Scleroderma (HCC) - RF-, Anti-CCP-, ANA negative, - PanANCA, Hx of morphea, raynaud's, recurrent iridocyclitis: No signs of sclerodactyly, nailbed capillary changes, or telemetry sensation is noted.  She remains on methotrexate 5 tablets by mouth once weekly and folic acid 1 mg daily prescribed by her dermatologist for morphea scleroderma.  She had a flare of morphea when she tried reducing the dose of methotrexate to 4 tablets weekly.   She has been taking methotrexate 5 tablets weekly consistently since her follow-up visit with her dermatologist on 03/01/22 and has not noticed any new or worsening skin lesions. No signs of inflammatory arthritis were noted on examination today. She is not experiencing new or worsening pulmonary symptoms.  Her lungs were clear to auscultation today.  Patient was evaluated by Dr. Gala Romney on 08/15/20: Echo and PFTs stable.  No evidence of PAH.  Recommended follow up in 2 years. Encouraged patient to continue to follow up with Dr. Gala Romney as recommended.  She will remain on methotrexate as prescribed by Dr. Janyth Contes.  She was advised to notify us if she develops any new or worsening symptoms.  She will follow up in 5 months or sooner if needed.   Morphea scleroderma - Followed closely by her dermatologist in [facility]. Janyth Contes.  Reviewed office visit note from 03/01/22.  Recommendation was to continue methotrexate 5 tablets once weekly with lab monitoring and follow-up visits every 4 months.  High risk medication use -  Methotrexate 5 tablets by mouth once weekly and folic acid 2 mg po daily. Chronic elevation of creatinine and low GFR.  CBC and CMP updated on 03/01/22.  She has not had any recent or recurrent infections. Discussed the importance of holding methotrexate if she develops signs or symptoms of an infection and to resume once the infection has completely cleared.   Raynaud's syndrome without gangrene: Not currently active.  She has less severe and less frequent symptoms with warmer weather temperatures.  No digital ulcerations were noted.  Good capillary refill noted.  No signs of sclerodactyly noted.  Recurrent acute iridocyclitis of both eyes:  She is not experiencing any symptoms of a flare. Patient was evaluated by Dr. Sherryll Burger on 04/13/2022 and had no signs of active inflammation on ocular exam.  Recommended follow up in 10 months.   Primary osteoarthritis of both hands: She has PIP and  DIP thickening consistent with osteoarthritis of both hands.  No tenderness or inflammation was noted on examination today.  She was able to make a complete fist bilaterally.  Primary osteoarthritis of both knees: She has good range of motion of both knee joints on examination today.  No warmth or effusion was noted.  Bilateral calcaneal spurs: X-rays of the right ankle were obtained on 10/20/2020 which were unremarkable.  She has noticed ongoing swelling in both ankle joints.  On examination  today no tenderness, warmth, or synovitis was noted.  Other medical conditions are listed as follows:   Positive PPD, treated  History of migraine  History of hyperlipidemia  History of hypertension: BP was 100/62 today in the office.  She was encouraged to monitor her BP closely at home.   Orders: No orders of the defined types were placed in this encounter.  No orders of the defined types were placed in this encounter.     Follow-Up Instructions: Return in about 5 months (around 10/04/2022) for Morphea scleroderma, Osteoarthritis.   Gearldine Bienenstock, PA-C  Note - This record has been created using Dragon software.  Chart creation errors have been sought, but may not always  have been located. Such creation errors do not reflect on  the standard of medical care.

## 2022-04-22 NOTE — Patient Instructions (Signed)

## 2022-04-23 LAB — LIPID PANEL
Chol/HDL Ratio: 3.1 ratio (ref 0.0–4.4)
Cholesterol, Total: 213 mg/dL — ABNORMAL HIGH (ref 100–199)
HDL: 69 mg/dL (ref >39)
LDL Chol Calc (NIH): 125 mg/dL — ABNORMAL HIGH (ref 0–99)
Triglycerides: 111 mg/dL (ref 0–149)
VLDL Cholesterol Cal: 19 mg/dL (ref 5–40)

## 2022-05-04 ENCOUNTER — Encounter: Payer: Self-pay | Admitting: Physician Assistant

## 2022-05-04 ENCOUNTER — Ambulatory Visit (INDEPENDENT_AMBULATORY_CARE_PROVIDER_SITE_OTHER): Payer: Medicare Other | Admitting: Physician Assistant

## 2022-05-04 VITALS — BP 100/62 | HR 65 | Ht 65.0 in | Wt 170.0 lb

## 2022-05-04 DIAGNOSIS — H20023 Recurrent acute iridocyclitis, bilateral: Secondary | ICD-10-CM

## 2022-05-04 DIAGNOSIS — M7731 Calcaneal spur, right foot: Secondary | ICD-10-CM

## 2022-05-04 DIAGNOSIS — Z8669 Personal history of other diseases of the nervous system and sense organs: Secondary | ICD-10-CM

## 2022-05-04 DIAGNOSIS — Z8639 Personal history of other endocrine, nutritional and metabolic disease: Secondary | ICD-10-CM | POA: Diagnosis not present

## 2022-05-04 DIAGNOSIS — I7300 Raynaud's syndrome without gangrene: Secondary | ICD-10-CM

## 2022-05-04 DIAGNOSIS — L940 Localized scleroderma [morphea]: Secondary | ICD-10-CM

## 2022-05-04 DIAGNOSIS — R7611 Nonspecific reaction to tuberculin skin test without active tuberculosis: Secondary | ICD-10-CM

## 2022-05-04 DIAGNOSIS — M7732 Calcaneal spur, left foot: Secondary | ICD-10-CM

## 2022-05-04 DIAGNOSIS — M19042 Primary osteoarthritis, left hand: Secondary | ICD-10-CM

## 2022-05-04 DIAGNOSIS — Z79899 Other long term (current) drug therapy: Secondary | ICD-10-CM

## 2022-05-04 DIAGNOSIS — M19041 Primary osteoarthritis, right hand: Secondary | ICD-10-CM

## 2022-05-04 DIAGNOSIS — M349 Systemic sclerosis, unspecified: Secondary | ICD-10-CM

## 2022-05-04 DIAGNOSIS — M170 Bilateral primary osteoarthritis of knee: Secondary | ICD-10-CM | POA: Diagnosis not present

## 2022-05-04 DIAGNOSIS — Z8679 Personal history of other diseases of the circulatory system: Secondary | ICD-10-CM

## 2022-06-23 ENCOUNTER — Encounter: Payer: Self-pay | Admitting: Internal Medicine

## 2022-06-24 NOTE — Progress Notes (Unsigned)
Office Visit Note  Patient: Rhonda Buchanan             Date of Birth: November 06, 1945           MRN: [id number]             PCP: Denita Lung, MD Referring: Denita Lung, MD Visit Date: 06/30/2022 Occupation: '@GUAROCC'$ @  Subjective:  Pain in knee joints  History of Present Illness: Rhonda M Lynk is a 76 y.o. female with history of morphea, Raynaud's, osteoarthritis.  She states she has noticed some new lesions of morphea.  Her dose of methotrexate was increased to 6 tablets p.o. weekly by Dr. Dossie Der.  She denies any sclerodactyly or Telangiectasia.  Raynauds is currently not active during the warmer weather.  She states she has been experiencing some popping sensation in her knees and  discomfort in her feet..  She has not noticed any joint swelling.  Activities of Daily Living:  Patient reports morning stiffness for a few minutes.   Patient Reports nocturnal pain.  Difficulty dressing/grooming: Denies Difficulty climbing stairs: Denies Difficulty getting out of chair: Denies Difficulty using hands for taps, buttons, cutlery, and/or writing: Denies  Review of Systems  Constitutional:  Positive for fatigue.  HENT:  Positive for mouth dryness. Negative for mouth sores.   Eyes:  Negative for dryness.  Respiratory:  Negative for shortness of breath.   Cardiovascular:  Negative for palpitations.  Gastrointestinal:  Negative for blood in stool, constipation and diarrhea.  Endocrine: Negative for increased urination.  Genitourinary:  Negative for involuntary urination.  Musculoskeletal:  Positive for joint pain, gait problem, joint pain and morning stiffness. Negative for joint swelling, myalgias, muscle weakness, muscle tenderness and myalgias.  Skin:  Positive for color change. Negative for rash, hair loss and sensitivity to sunlight.  Allergic/Immunologic: Negative for susceptible to infections.  Neurological:  Negative for dizziness and headaches.  Hematological:  Negative for  swollen glands.  Psychiatric/Behavioral:  Negative for depressed mood and sleep disturbance. The patient is not nervous/anxious.     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   Scleroderma ([place]) 07/12/2013   Hypertension 03/04/2011   Hyperlipidemia 03/04/2011   Allergic rhinitis 03/04/2011   Arthritis 03/04/2011   Migraine variant 04/18/1995    Past Medical History:  Diagnosis Date   Allergy    Arthritis    Hypercholesterolemia    Hypertension    PUD (peptic ulcer disease)    Scleroderma ([place])     Family History  Problem Relation Age of Onset   Cancer Father    Ulcers Sister    High Cholesterol Sister    Ulcers Brother    Heart disease Brother 9       MI   Cancer Maternal Grandmother    Heart disease Maternal Grandfather    Past Surgical History:  Procedure Laterality Date   arthroscopy     CYST REMOVAL HAND     EYE SURGERY Left 10/25/2017   laser surgery    KNEE ARTHROPLASTY  Social History   Social History Narrative   Not on file   Immunization History  Administered Date(s) Administered   Fluad Quad(high Dose 65+) 12/19/2020, 09/29/2021   H1N1 08/22/2009   Influenza, High Dose Seasonal PF 06/24/2015, 10/05/2016, 08/04/2018   Influenza,inj,Quad PF,6+ Mos 07/12/2013, 01/15/2015   Influenza-Unspecified 06/24/2015, 10/05/2016, 12/24/2017, 08/04/2018   PFIZER(Purple Top)SARS-COV-2 Vaccination 12/13/2019, 01/08/2020, 10/03/2020   Pfizer Covid-19 Vaccine Bivalent Booster 9yr & up  09/29/2021   Pneumococcal Conjugate-13 05/20/2014   Pneumococcal Polysaccharide-23 01/31/2007   Tdap 01/31/2007, 12/19/2020   Zoster, Live 07/02/2008     Objective: Vital Signs: BP 118/70 (BP Location: Left Arm, Patient Position: Sitting, Cuff Size: Normal)   Pulse 61   Resp 16   Ht '5\' 5"'$  (1.651 m)   Wt 167 lb 9.6 oz (76 kg)   LMP 02/16/2003   BMI 27.89 kg/m    Physical Exam Vitals and nursing note reviewed.  Constitutional:      Appearance: She is well-developed.  HENT:     Head: Normocephalic and atraumatic.  Eyes:     Conjunctiva/sclera: Conjunctivae normal.  Cardiovascular:     Rate and Rhythm: Normal rate and regular rhythm.     Heart sounds: Normal heart sounds.  Pulmonary:     Effort: Pulmonary effort is normal.     Breath sounds: Normal breath sounds.  Abdominal:     General: Bowel sounds are normal.     Palpations: Abdomen is soft.  Musculoskeletal:     Cervical back: Normal range of motion.  Lymphadenopathy:     Cervical: No cervical adenopathy.  Skin:    General: Skin is warm and dry.     Capillary Refill: Capillary refill takes less than 2 seconds.     Comments: She had good capillary refill without any Telengectesia or nailbed capillary changes.  No sclerodactyly was noted.  Neurological:     Mental Status: She is alert and oriented to person, place, and time.  Psychiatric:        Behavior: Behavior normal.      Musculoskeletal Exam: Cervical, thoracic and lumbar spine were in good range of motion.  Shoulder joints, elbow joints, wrist joints, MCPs PIPs and DIPs been good range of motion with no synovitis.  Hip joints and knee joints with good range of motion.  No warmth swelling or effusion was noted.  She has crepitus in bilateral knee joints and discomfort with range of motion.  There was no tenderness over ankles.  She had bilateral hammertoes and overcrowding of the toes.  CDAI Exam: CDAI Score: -- Patient Global: --; Provider Global:  -- Swollen: --; Tender: -- Joint Exam 06/30/2022   No joint exam has been documented for this visit   There is currently no information documented on the homunculus. Go to the Rheumatology activity and complete the homunculus joint exam.  Investigation: No additional findings.  Imaging: No results found.  Recent Labs: Lab Results  Component Value Date   WBC 4.3 06/05/2021   HGB 12.9 06/05/2021   PLT 231 06/05/2021   NA 138 07/03/2021   K 3.1 (L) 07/03/2021   CL 97 07/03/2021   CO2 27 07/03/2021   GLUCOSE 103 (H) 07/03/2021   BUN 16 07/03/2021   CREATININE 1.54 (H) 07/03/2021   BILITOT 0.5 07/03/2021   ALKPHOS 73 07/03/2021   AST 25 07/03/2021   ALT 22 07/03/2021   PROT 6.6 07/03/2021   ALBUMIN 4.3 07/03/2021   CALCIUM 10.3 07/03/2021   GFRAA 62 08/01/2020  Speciality Comments: No specialty comments available.  Procedures:  No procedures performed Allergies: Aspirin and Ibuprofen   Assessment / Plan:     Visit Diagnoses: Scleroderma ([place]) - History of morphea, Raynauds, recurrent iridocyclitis.  No sclerodactyly or inflammatory arthritis.  She is on methotrexate for morphea by her dermatologist.  Morphea scleroderma - Followed at [facility] dermatology by Dr. Otho Ket, who recommended staying on methotrexate 5 tablets p.o. weekly.  Patient gets her labs monitored by her dermatologist.  High risk medication use - Methotrexate 6 tablets p.o. weekly folic acid 2 mg p.o. daily.  Patient has low GFR.Mar 01, 2022 CBC with differential normal, CMP potassium 3.1, creatinine 1.22, GFR 46, calcium 10.5.  Advised patient to discuss low potassium and elevated creatinine with her PCP and dermatologist.  Positive PPD, treated  Raynaud's syndrome without gangrene-currently not active.  She had good capillary refill without any sclerodactyly.  Recurrent acute iridocyclitis of both eyes - Evaluated by Dr. Manuella Ghazi April 13, 2022.  No activity was noted.  Primary osteoarthritis of both  hands - She has bilateral PIP and DIP thickening.  Joint protection muscle strengthening was discussed.  Primary osteoarthritis of both knees-she complains of popping sensation in her knees and discomfort.  She had crepitus with range of motion of bilateral knee joints.  X-rays of bilateral knee joints were obtained today.  X-ray findings were discussed with the patient.  X-rays were consistent with severe lateral compartment narrowing bilaterally and severe chondromalacia patella.  She will eventually require total knee replacement.  I advised her to contact us when she is ready for a referral to an orthopedic surgeon.  I will refer her to physical therapy.  Primary osteoarthritis of both feet-she has bilateral hammertoes and overcrowding of the toes.  She has been having discomfort in her feet.  I will refer her to podiatrist.  Bilateral calcaneal spurs -she complains of discomfort in her feet off and on..  History of hypertension-blood pressure was normal today.  History of hyperlipidemia  History of migraine  Orders: No orders of the defined types were placed in this encounter.  No orders of the defined types were placed in this encounter.    Follow-Up Instructions: No follow-ups on file.   Bo Merino, MD  Note - This record has been created using Editor, commissioning.  Chart creation errors have been sought, but may not always  have been located. Such creation errors do not reflect on  the standard of medical care.

## 2022-06-30 ENCOUNTER — Ambulatory Visit (INDEPENDENT_AMBULATORY_CARE_PROVIDER_SITE_OTHER): Payer: Medicare Other

## 2022-06-30 ENCOUNTER — Ambulatory Visit: Payer: Medicare Other | Attending: Rheumatology | Admitting: Rheumatology

## 2022-06-30 ENCOUNTER — Encounter: Payer: Self-pay | Admitting: Rheumatology

## 2022-06-30 VITALS — BP 118/70 | HR 61 | Resp 16 | Ht 65.0 in | Wt 167.6 lb

## 2022-06-30 DIAGNOSIS — M19041 Primary osteoarthritis, right hand: Secondary | ICD-10-CM

## 2022-06-30 DIAGNOSIS — M349 Systemic sclerosis, unspecified: Secondary | ICD-10-CM

## 2022-06-30 DIAGNOSIS — M7731 Calcaneal spur, right foot: Secondary | ICD-10-CM | POA: Diagnosis not present

## 2022-06-30 DIAGNOSIS — M25562 Pain in left knee: Secondary | ICD-10-CM | POA: Diagnosis not present

## 2022-06-30 DIAGNOSIS — M25561 Pain in right knee: Secondary | ICD-10-CM | POA: Diagnosis not present

## 2022-06-30 DIAGNOSIS — M170 Bilateral primary osteoarthritis of knee: Secondary | ICD-10-CM

## 2022-06-30 DIAGNOSIS — G8929 Other chronic pain: Secondary | ICD-10-CM

## 2022-06-30 DIAGNOSIS — M19042 Primary osteoarthritis, left hand: Secondary | ICD-10-CM

## 2022-06-30 DIAGNOSIS — M19072 Primary osteoarthritis, left ankle and foot: Secondary | ICD-10-CM

## 2022-06-30 DIAGNOSIS — Z8639 Personal history of other endocrine, nutritional and metabolic disease: Secondary | ICD-10-CM

## 2022-06-30 DIAGNOSIS — Z8679 Personal history of other diseases of the circulatory system: Secondary | ICD-10-CM

## 2022-06-30 DIAGNOSIS — H20023 Recurrent acute iridocyclitis, bilateral: Secondary | ICD-10-CM | POA: Diagnosis not present

## 2022-06-30 DIAGNOSIS — I7300 Raynaud's syndrome without gangrene: Secondary | ICD-10-CM | POA: Diagnosis not present

## 2022-06-30 DIAGNOSIS — Z8669 Personal history of other diseases of the nervous system and sense organs: Secondary | ICD-10-CM

## 2022-06-30 DIAGNOSIS — M7732 Calcaneal spur, left foot: Secondary | ICD-10-CM

## 2022-06-30 DIAGNOSIS — R7611 Nonspecific reaction to tuberculin skin test without active tuberculosis: Secondary | ICD-10-CM | POA: Diagnosis not present

## 2022-06-30 DIAGNOSIS — M19071 Primary osteoarthritis, right ankle and foot: Secondary | ICD-10-CM

## 2022-06-30 DIAGNOSIS — L940 Localized scleroderma [morphea]: Secondary | ICD-10-CM | POA: Diagnosis not present

## 2022-06-30 DIAGNOSIS — Z79899 Other long term (current) drug therapy: Secondary | ICD-10-CM | POA: Diagnosis not present

## 2022-06-30 NOTE — Addendum Note (Signed)
Addended by: Earnestine Mealing on: 06/30/2022 02:31 PM   Modules accepted: Orders

## 2022-07-13 ENCOUNTER — Ambulatory Visit (INDEPENDENT_AMBULATORY_CARE_PROVIDER_SITE_OTHER): Payer: Medicare Other

## 2022-07-13 ENCOUNTER — Ambulatory Visit: Payer: Medicare Other | Admitting: Podiatry

## 2022-07-13 DIAGNOSIS — M19071 Primary osteoarthritis, right ankle and foot: Secondary | ICD-10-CM

## 2022-07-13 DIAGNOSIS — M2041 Other hammer toe(s) (acquired), right foot: Secondary | ICD-10-CM | POA: Diagnosis not present

## 2022-07-13 DIAGNOSIS — M19072 Primary osteoarthritis, left ankle and foot: Secondary | ICD-10-CM

## 2022-07-13 DIAGNOSIS — M2042 Other hammer toe(s) (acquired), left foot: Secondary | ICD-10-CM

## 2022-07-13 DIAGNOSIS — G609 Hereditary and idiopathic neuropathy, unspecified: Secondary | ICD-10-CM

## 2022-07-13 DIAGNOSIS — M19079 Primary osteoarthritis, unspecified ankle and foot: Secondary | ICD-10-CM

## 2022-07-13 MED ORDER — GABAPENTIN 100 MG PO CAPS: 100.0000 mg | ORAL_CAPSULE | Freq: Every day | ORAL | 0 refills | Status: DC

## 2022-07-16 NOTE — Progress Notes (Signed)
  Subjective:  Patient ID: Rhonda Buchanan, female    DOB: 11-12-1945,  MRN: [id number]  Chief Complaint  Patient presents with   Arthritis    Osteoarthritis of both feet - patient's rheumatologist referred her to our office - she has hammertoes and calluses. They did xrays of her feet recently    76 y.o. female presents with the above complaint. History confirmed with patient.  Overall the toes do not particularly hurt her.  She does have calluses and thickened elongated nails.  She does get sharp shooting tingling pain sometimes especially at night  Objective:  Physical Exam: warm, good capillary refill, no trophic changes or ulcerative lesions, normal DP and PT pulses, and subjective paresthesias she has semireducible hammertoe deformities.  Multiple pedal calluses and thickened elongated nails.   Assessment:   1. Idiopathic peripheral neuropathy   2. Hammertoe of left foot   3. Hammertoe of right foot      Plan:  Patient was evaluated and treated and all questions answered.  Suspect most of the symptoms she is having are likely related to peripheral neuropathy.  I recommended gabapentin 100 mg to be able to use as needed.  She will be scheduled for routine regular foot care.  We discussed that she does have hammertoes and likely arthritis in her feet.  We discussed surgical and nonsurgical treatment of this.  Currently these are relatively asymptomatic on exam and I recommend she continue to monitor these for now.  She will return as needed if these worsens to see me  Return if symptoms worsen or fail to improve.

## 2022-07-19 DIAGNOSIS — Z79899 Other long term (current) drug therapy: Secondary | ICD-10-CM | POA: Diagnosis not present

## 2022-07-19 DIAGNOSIS — H209 Unspecified iridocyclitis: Secondary | ICD-10-CM | POA: Diagnosis not present

## 2022-07-19 DIAGNOSIS — L940 Localized scleroderma [morphea]: Secondary | ICD-10-CM | POA: Diagnosis not present

## 2022-07-19 DIAGNOSIS — R5383 Other fatigue: Secondary | ICD-10-CM | POA: Diagnosis not present

## 2022-07-27 ENCOUNTER — Encounter: Payer: Self-pay | Admitting: Internal Medicine

## 2022-07-29 DIAGNOSIS — Z1231 Encounter for screening mammogram for malignant neoplasm of breast: Secondary | ICD-10-CM | POA: Diagnosis not present

## 2022-07-29 LAB — HM MAMMOGRAPHY

## 2022-08-03 ENCOUNTER — Encounter: Payer: Self-pay | Admitting: Family Medicine

## 2022-08-09 ENCOUNTER — Encounter: Payer: Self-pay | Admitting: Internal Medicine

## 2022-08-12 ENCOUNTER — Ambulatory Visit (HOSPITAL_BASED_OUTPATIENT_CLINIC_OR_DEPARTMENT_OTHER)
Admission: RE | Admit: 2022-08-12 | Discharge: 2022-08-12 | Disposition: A | Discharge status: Home / Self Care | Payer: Medicare Other | Source: Ambulatory Visit | Attending: Internal Medicine | Admitting: Internal Medicine

## 2022-08-12 ENCOUNTER — Ambulatory Visit (HOSPITAL_COMMUNITY)
Admission: RE | Admit: 2022-08-12 | Discharge: 2022-08-12 | Disposition: A | Discharge status: Home / Self Care | Payer: Medicare Other | Source: Ambulatory Visit | Attending: Internal Medicine | Admitting: Internal Medicine

## 2022-08-12 VITALS — BP 130/72 | HR 63 | Wt 166.4 lb

## 2022-08-12 DIAGNOSIS — I700 Atherosclerosis of aorta: Secondary | ICD-10-CM | POA: Insufficient documentation

## 2022-08-12 DIAGNOSIS — I110 Hypertensive heart disease with heart failure: Secondary | ICD-10-CM | POA: Diagnosis not present

## 2022-08-12 DIAGNOSIS — M349 Systemic sclerosis, unspecified: Secondary | ICD-10-CM | POA: Diagnosis not present

## 2022-08-12 DIAGNOSIS — M3489 Other systemic sclerosis: Secondary | ICD-10-CM | POA: Diagnosis not present

## 2022-08-12 DIAGNOSIS — I350 Nonrheumatic aortic (valve) stenosis: Secondary | ICD-10-CM

## 2022-08-12 DIAGNOSIS — R600 Localized edema: Secondary | ICD-10-CM | POA: Diagnosis not present

## 2022-08-12 DIAGNOSIS — I2510 Atherosclerotic heart disease of native coronary artery without angina pectoris: Secondary | ICD-10-CM

## 2022-08-12 DIAGNOSIS — I5022 Chronic systolic (congestive) heart failure: Secondary | ICD-10-CM | POA: Insufficient documentation

## 2022-08-12 DIAGNOSIS — I10 Essential (primary) hypertension: Secondary | ICD-10-CM | POA: Diagnosis not present

## 2022-08-12 LAB — PULMONARY FUNCTION TEST
DL/VA % pred: 92 %
DL/VA: 3.73 ml/min/mmHg/L
DLCO unc % pred: 72 %
DLCO unc: 14.79 ml/min/mmHg
FEF 25-75 Pre: 1.91 L/sec
FEF2575-%Pred-Pre: 114 %
FEV1-%Pred-Pre: 93 %
FEV1-Pre: 2.08 L
FEV1FVC-%Pred-Pre: 106 %
FEV6-%Pred-Pre: 91 %
FEV6-Pre: 2.58 L
FEV6FVC-%Pred-Pre: 102 %
FVC-%Pred-Pre: 88 %
FVC-Pre: 2.64 L
Pre FEV1/FVC ratio: 79 %
Pre FEV6/FVC Ratio: 98 %
RV % pred: 106 %
RV: 2.58 L
TLC % pred: 97 %
TLC: 5.2 L

## 2022-08-12 LAB — ECHOCARDIOGRAM COMPLETE
AR max vel: 1.51 cm2
AV Area VTI: 1.49 cm2
AV Area mean vel: 1.49 cm2
AV Mean grad: 8 mmHg
AV Peak grad: 14.4 mmHg
Ao pk vel: 1.9 m/s
Area-P 1/2: 2.68 cm2
S' Lateral: 2.4 cm

## 2022-08-12 NOTE — Progress Notes (Signed)
Patient ID: Rhonda Buchanan, female   DOB: Jul 01, 1946, 76 y.o.   MRN: [id number]   [facility]  NOTE  Referring Physician: Dr. Kirke Corin Primary Care: Dr. Glo Herring   HPI: Rhonda Buchanan is a 76 y.o. 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 Patrecia Pour for screening for [facility].  She presents today for yearly follow up. Doing well. Remains active. No SOB. Mild LE edema. No dizziness or presyncope.    Echo today 08/12/22 EF 60-65% G1DD RV normal. Mild TR RVSP 30-24mHG  PFTS 08/12/22;  FEV1  2.08 (93%) FVC    2.64L (88%) DLCO 72%  Previous studies:  Echo 08/15/20 : EF 60-65% RV normal. No evidence of PAH. Aov calcified no significant AS Personally reviewed  PFTs 10/21 FEV1  1.86 (98%) FVC    2.25 L (92%) DLCO 73%   PFTs 2/20 FEV1  2.0L (111%) FVC    2.4L (103% DLCO 70%   PFTs 2017  FEV1 2.07 (109%) FVC  2.54 (104%) DLCO 64%  PFTs 08/12/17-  FEV1 2.03 (109%) FVC  2.43 (101%) DLCO 60%  09/2017 Stress Echo EF 65-70%, Grade 1 DD. No chest pain. + Hypertensive BP response. No stress induced WMAs.   08/12/2017 ECHO 60-65%. No evidence PAH. Mild AS Normal RV   Hi-res CT of chest done 8/17 for abnormal DLCO 1. No evidence of interstitial lung disease. 2. No evidence of achalasia. 3. No acute findings in the thorax. 4. Aortic atherosclerosis, in addition to left main and 3 vessel coronary artery disease. Please note that although the presence of coronary artery calcium documents the presence of coronary artery disease, the severity of this disease and any potential stenosis cannot be assessed on this non-gated CT examination. Assessment for potential risk factor modification, dietary therapy or pharmacologic therapy may be warranted, if clinically indicated. 5. There are calcifications of the aortic valve. Echocardiographic correlation for evaluation of potential valvular dysfunction may be warranted if clinically  indicated.    Past Medical History:  Diagnosis Date   Allergy    Arthritis    Hypercholesterolemia    Hypertension    PUD (peptic ulcer disease)    Scleroderma ([place]     Current Outpatient Medications  Medication Sig Dispense Refill   Brinzolamide-Brimonidine 1-0.2 % SUSP Apply 1 drop to eye 3 (three) times daily.      calcium carbonate (OS-CAL) 600 MG TABS Take 600 mg by mouth daily.     fluocinonide ointment (LIDEX) 0.05 % as needed.     folic acid (FOLVITE) 1 MG tablet Take 3 mg by mouth daily.      gabapentin (NEURONTIN) 100 MG capsule Take 1 capsule (100 mg total) by mouth at bedtime. 90 capsule 0   lisinopril-hydrochlorothiazide (ZESTORETIC) 10-12.5 MG tablet Take 1 tablet by mouth daily. 90 tablet 3   loratadine (CLARITIN) 10 MG tablet Take 10 mg by mouth as needed.     methotrexate (RHEUMATREX) 2.5 MG tablet TAKE( 6) '15MG'$  ONCE A WEEK Caution:Chemotherapy. Protect from light.     metoprolol succinate (TOPROL-XL) 50 MG 24 hr tablet TAKE 1/2 TABLET BY MOUTH DAILY. TAKE WITH OR IMMEDIATELY FOLLOWING A MEAL. 45 tablet 1   multivitamin (THERAGRAN) per tablet Take 1 tablet by mouth daily.     nystatin (MYCOSTATIN/NYSTOP) powder Apply to moist areas as needed     prednisoLONE acetate (PRED FORTE) 1 % ophthalmic suspension Place 1 drop into both eyes daily.      rosuvastatin (  CRESTOR) 40 MG tablet Take 1 tablet (40 mg total) by mouth daily. 90 tablet 3   SYSTANE ULTRA 0.4-0.3 % SOLN SMARTSIG:1 Drop(s) In Eye(s) PRN     timolol (BETIMOL) 0.5 % ophthalmic solution Place 1 drop into both eyes daily.     No current facility-administered medications for this encounter.   Allergies  Allergen Reactions   Aspirin Other (See Comments)   Ibuprofen     GI upset    Social History   Socioeconomic History   Marital status: Divorced    Spouse name: Not on file   Number of children: Not on file   Years of education: Not on file   Highest education level: Not on file  Occupational  History   Not on file  Tobacco Use   Smoking status: Never    Passive exposure: Never   Smokeless tobacco: Never  Vaping Use   Vaping Use: Never used  Substance and Sexual Activity   Alcohol use: No   Drug use: No   Sexual activity: Not Currently  Other Topics Concern   Not on file  Social History Narrative   Not on file   Social Determinants of Health   Financial Resource Strain: Not on file  Food Insecurity: Not on file  Transportation Needs: Not on file  Physical Activity: Not on file  Stress: Not on file  Social Connections: Not on file  Intimate Partner Violence: Not on file      Family History  Problem Relation Age of Onset   Cancer Father    Ulcers Sister    High Cholesterol Sister    Ulcers Brother    Heart disease Brother 30       MI   Cancer Maternal Grandmother    Heart disease Maternal Grandfather     Vitals:   08/12/22 1413  BP: 130/72  Pulse: 63  SpO2: 100%  Weight: 75.5 kg (166 lb 6.4 oz)    Wt Readings from Last 3 Encounters:  08/12/22 75.5 kg (166 lb 6.4 oz)  06/30/22 76 kg (167 lb 9.6 oz)  05/04/22 77.1 kg (170 lb)    PHYSICAL EXAM: General:  Well appearing. No resp difficulty HEENT: normal Neck: supple. no JVD. Carotids 2+ bilat; no bruits. No lymphadenopathy or thryomegaly appreciated. Cor: PMI nondisplaced. Regular rate & rhythm. 2/6 SEM RUSB s2 ok  Lungs: clear Abdomen: soft, nontender, nondistended. No hepatosplenomegaly. No bruits or masses. Good bowel sounds. Extremities: no cyanosis, clubbing, rash, edema Neuro: alert & orientedx3, cranial nerves grossly intact. moves all 4 extremities w/o difficulty. Affect pleasant   ECG: NSR 67 1AVB 206 ms No ST-T wave abnormalities.Personally reviewed   ASSESSMENT & PLAN: 1. Scleroderma - 09/2017 Stress Echo EF 65-70%, Grade 1 DD. No chest pain. + Hypertensive BP response. No stress induced WMAs - Echo 2/20 EF 55-60% Grade II DD. Thickened Aov without overt AS - Echo 08/15/20 : EF  60-65% RV normal. No evidence of PAH. Aov calcified no significant AS Personally reviewed - Echo today 08/12/22 EF 60-65% G1DD RV normal. Mild TR RVSP 30-17mHG - Echo and PFTs stable. PA pressures on high end of normal. No indication for RHC now.  [place] yearly screening 2. HTN - Blood pressure well controlled. Continue current regimen. 3. Very mild aortic stenosis - AoV quite calcified but no frank AS. Will need to follow - No AS on echo today 4. Coronary calcifications - By CT scan. - 09/2017 Stress Echo EF 65-70%, Grade 1 DD. No  chest pain. + Hypertensive BP response. No stress induced WMAs - No s/s of ischemia.    5. Fatigue -  Had home sleep study which was normal  Glori Bickers, MD  2:33 PM

## 2022-08-12 NOTE — Progress Notes (Signed)
*  PRELIMINARY RESULTS* Echocardiogram 2D Echocardiogram has been performed.  Rhonda Buchanan 08/12/2022, 2:04 PM

## 2022-08-12 NOTE — Addendum Note (Signed)
Encounter addended by: Stanford Scotland, RN on: 08/12/2022 2:46 PM  Actions taken: Visit diagnoses modified, Order list changed, Diagnosis association updated, Clinical Note Signed

## 2022-08-12 NOTE — Patient Instructions (Addendum)
Good to see you today!  No medication changes were made  Your physician has requested that you have an echocardiogram. Echocardiography is a painless test that uses sound waves to create images of your heart. It provides your doctor with information about the size and shape of your heart and how well your heart's chambers and valves are working. This procedure takes approximately one hour. There are no restrictions for this procedure. Please do NOT wear cologne, perfume, aftershave, or lotions (deodorant is allowed). Please arrive 15 minutes prior to your appointment time.  Your physician has recommended that you have a pulmonary function test. Pulmonary Function Tests are a group of tests that measure how well air moves in and out of your lungs.  Your physician recommends that you schedule a follow-up appointment in: 12 months with echocardiogram and  PFT(pulmonary function test)  If you have any questions or concerns before your next appointment please send [place] a message through [place] or call our office at [phone].    TO LEAVE A MESSAGE FOR THE NURSE SELECT OPTION 2, PLEASE LEAVE A MESSAGE INCLUDING: YOUR NAME DATE OF BIRTH CALL BACK NUMBER REASON FOR CALL**this is important as we prioritize the call backs  YOU WILL RECEIVE A CALL BACK THE SAME DAY AS LONG AS YOU CALL BEFORE 4:00 PM At the [facility], you and your health needs are our priority. As part of our continuing mission to provide you with exceptional heart care, we have created designated Provider Care Teams. These Care Teams include your primary Cardiologist (physician) and Advanced Practice Providers (APPs- Physician Assistants and Nurse Practitioners) who all work together to provide you with the care you need, when you need it.   You may see any of the following providers on your designated Care Team at your next follow up: Dr Glori Bickers Dr Loralie Champagne Dr. Roxana Hires,  NP Lyda Jester, [place] [facility] [place], [place] Forestine Na, NP Audry Riles, PharmD   Please be sure to bring in all your medications bottles to every appointment.

## 2022-08-24 ENCOUNTER — Ambulatory Visit: Payer: Medicare Other | Admitting: Podiatry

## 2022-08-24 ENCOUNTER — Encounter: Payer: Self-pay | Admitting: Podiatry

## 2022-08-24 DIAGNOSIS — B351 Tinea unguium: Secondary | ICD-10-CM

## 2022-08-24 DIAGNOSIS — I739 Peripheral vascular disease, unspecified: Secondary | ICD-10-CM

## 2022-08-24 DIAGNOSIS — M79675 Pain in left toe(s): Secondary | ICD-10-CM | POA: Diagnosis not present

## 2022-08-24 DIAGNOSIS — M79674 Pain in right toe(s): Secondary | ICD-10-CM

## 2022-08-24 DIAGNOSIS — L84 Corns and callosities: Secondary | ICD-10-CM

## 2022-08-25 ENCOUNTER — Encounter: Payer: Self-pay | Admitting: Podiatry

## 2022-08-25 NOTE — Progress Notes (Signed)
This patient returns to my office for at risk foot care.  This patient requires this care by a professional since this patient will be at risk due to having  PAD.  This patient is unable to cut nails herself since the patient cannot reach her nails.These nails are painful walking and wearing shoes.  This patient presents for at risk foot care today.  General Appearance  Alert, conversant and in no acute stress.  Vascular  Dorsalis pedis and posterior tibial  pulses are palpable  bilaterally.  Capillary return is within normal limits  bilaterally. Temperature is within normal limits  bilaterally.  Neurologic  Senn-Weinstein monofilament wire test within normal limits  bilaterally. Muscle power within normal limits bilaterally.  Nails Thick disfigured discolored nails with subungual debris  from hallux to fifth toes bilaterally. No evidence of bacterial infection or drainage bilaterally.  Orthopedic  No limitations of motion  feet .  No crepitus or effusions noted.  No bony pathology or digital deformities noted.  Skin  normotropic skin with no porokeratosis noted bilaterally.  No signs of infections or ulcers noted.   Callus sub 5th metabase  B/L.  Onychomycosis  Pain in right toes  Pain in left toes  Callus  B/L.  Consent was obtained for treatment procedures.   Mechanical debridement of nails 1-5  bilaterally performed with a nail nipper.  Filed with dremel without incident. Debride callus sub 5th met  B/L.   Return office visit  3 months                    Told patient to return for periodic foot care and evaluation due to potential at risk complications.   Gregory Mayer DPM   

## 2022-09-23 NOTE — Therapy (Signed)
OUTPATIENT PHYSICAL THERAPY LOWER EXTREMITY EVALUATION   Patient Name: Rhonda Buchanan MRN: [id number] DOB:November 30, 1945, 76 y.o., female Today's Date: 09/24/2022  END OF SESSION:  PT End of Session - 09/24/22 0935     Visit Number 1    Number of Visits 12    Date for PT Re-Evaluation 11/05/22             Past Medical History:  Diagnosis Date   Allergy    Arthritis    Hypercholesterolemia    Hypertension    PUD (peptic ulcer disease)    Scleroderma ([place])    Past Surgical History:  Procedure Laterality Date   arthroscopy     CYST REMOVAL HAND     EYE SURGERY Left 10/25/2017   laser surgery    KNEE ARTHROPLASTY     Patient Active Problem List   Diagnosis Date Noted   Pain due to onychomycosis of toenails of both feet 08/24/2022   Callus 08/24/2022   PAD (peripheral artery disease) ([place]) 04/22/2022   Aortic atherosclerosis ([place]) 04/15/2021   High risk medication use 04/09/2020   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   Atherosclerosis of coronary artery of native heart without angina pectoris 10/05/2016   Aortic stenosis 05/21/2016   Abnormal PFTs 05/21/2016   Asthma due to environmental allergies 06/24/2015   Positive PPD, treated 06/05/2015   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/14/2014   Scleroderma ([place]) 07/12/2013   Hypertension 03/04/2011   Hyperlipidemia 03/04/2011   Allergic rhinitis 03/04/2011   Arthritis 03/04/2011   Migraine variant 04/18/1995    PCP: Jill Alexanders MD   REFERRING PROVIDER: Deveshwar  REFERRING DIAG:  Diagnosis  M17.0 (ICD-10-CM) - Primary osteoarthritis of both knees    THERAPY DIAG:  Chronic pain of left knee  Chronic pain of right knee  Difficulty in walking, not elsewhere classified  Rationale for Evaluation and Treatment: Rehabilitation  ONSET DATE: chronic , 6 mos increase in pain  SUBJECTIVE:   SUBJECTIVE STATEMENT: Patient with 15 + yr history of knee pain . She noticed an increase in knee popping about  6 mos ago.  She has difficulty walking and with steps.  Hard to get up and stand up straight , walks more slowly.  It is difficult to do her housework, .  She walks without a device.  The doctor said I'll need a knee replacement.   PERTINENT HISTORY: Scleroderma, Morphea PAIN:  Are you having pain? Yes: NPRS scale: 4/10 Pain location: bilateral knees Pain description: aching pressure  Aggravating factors: walking, stairs Relieving factors: rest, does not use ice/heat    PRECAUTIONS: None  WEIGHT BEARING RESTRICTIONS: No  FALLS:  Has patient fallen in last 6 months?  Can occasionally lose her balance   LIVING ENVIRONMENT: Lives with: lives with their family her younger sister Lives at the middle of the nightwho is also physically  Lives in: House/apartment Stairs: Yes: External: 4-5 steps; on right going up Has following equipment at home: Single point cane  OCCUPATION: was a Government social research officer, retired   PLOF: Independent with basic ADLs, Independent with household mobility without device, and Independent with community mobility without device  PATIENT GOALS: I want to be able to get up better improve walking and posture  NEXT MD VISIT:   OBJECTIVE:   DIAGNOSTIC FINDINGS:   06/2022: Severe lateral compartment narrowing with lateral and intercondylar  osteophytes was noted.  Severe patellofemoral narrowing was noted.  No  chondrocalcinosis was noted.   Impression: These findings are consistent  with severe osteoarthritis and  severe chondromalacia patella.    PATIENT SURVEYS:  FOTO 60%  COGNITION: Overall cognitive status: Within functional limits for tasks assessed     SENSATION: WFL  Toes numb but not sure if she has neuropathy   EDEMA:  NT  MUSCLE LENGTH: NT  POSTURE: rounded shoulders, forward head, increased thoracic kyphosis, flexed trunk , and genu valgus  PALPATION: Pain medial compartment and TTP throughout peri-patellar  LOWER EXTREMITY  ROM:  Active ROM Right eval Left eval  Hip flexion    Hip extension    Hip abduction    Hip adduction    Hip internal rotation    Hip external rotation    Knee flexion 102 108  Knee extension 8 11  Ankle dorsiflexion    Ankle plantarflexion    Ankle inversion    Ankle eversion     (Blank rows = not tested)  LOWER EXTREMITY MMT:  MMT Right eval Left eval  Hip flexion 4 4  Hip extension    Hip abduction 3- 3-  Hip adduction    Hip internal rotation    Hip external rotation    Knee flexion 4+ 4+  Knee extension 4+ 4+  Ankle dorsiflexion 5 5  Ankle plantarflexion    Ankle inversion    Ankle eversion     (Blank rows = not tested)  LOWER EXTREMITY SPECIAL TESTS:  NT   FUNCTIONAL TESTS:  5 times sit to stand: 43 sec  30 seconds chair stand test Timed up and go (TUG): NT  2 minute walk test: 288 feet   GAIT: Distance walked: 150 Assistive device utilized: None Level of assistance: Modified independence Comments: trunk flexed, genu valgus    TODAY'S TREATMENT:                                                                                                                              DATE: 09/24/22    PATIENT EDUCATION:  Education details: PT/POC ,HEP  Person educated: Patient Education method: Consulting civil engineer, Media planner, and Handouts Education comprehension: verbalized understanding, returned demonstration, and needs further education  HOME EXERCISE PROGRAM: Access Code: [email or web address] URL: [email or web address] Date: 09/24/2022 Prepared by: Raeford Razor  Exercises - Seated Hamstring Stretch  - 1 x daily - 7 x weekly - 1 sets - 5 reps - 30 hold - Gastroc Stretch on Wall  - 1 x daily - 7 x weekly - 1 sets - 5 reps - 30 hold - Standing Hip Flexor Stretch  - 1 x daily - 7 x weekly - 1 sets - 5 reps - 30 hold  ASSESSMENT:  CLINICAL IMPRESSION: Patient is a 76 y.o. female who was seen today for physical therapy evaluation and treatment for  bilateral knee pain.   OBJECTIVE IMPAIRMENTS: Abnormal gait, decreased activity tolerance, decreased mobility, difficulty walking, decreased ROM, decreased strength, hypomobility, increased fascial restrictions, impaired flexibility, improper body mechanics, postural dysfunction, and pain.   ACTIVITY  LIMITATIONS: carrying, lifting, bending, standing, squatting, stairs, transfers, locomotion level, and caring for others  PARTICIPATION LIMITATIONS: cleaning, laundry, shopping, and community activity  PERSONAL FACTORS: Age, Time since onset of injury/illness/exacerbation, and 1-2 comorbidities: scleroderma, previous knee scopes  are also affecting patient's functional outcome.   REHAB POTENTIAL: Excellent  CLINICAL DECISION MAKING: Stable/uncomplicated  EVALUATION COMPLEXITY: Low   GOALS: Goals reviewed with patient? Yes  SHORT TERM GOALS: Target date: 11/05/2022   Pt will be I with HEP for LE strengthening  Baseline: Goal status: INITIAL  2.  Pt will note greater ease of sit to stand transfers due to improved knee mobility Baseline:  Goal status: INITIAL  3.  Pt will be able to increase steps per day to 4000 Baseline: 2500-3000 steps  Goal status: INITIAL   LONG TERM GOALS: Target date: 11/19/2022    Pt will be I with final HEP  Baseline:  Goal status: INITIAL  2.  FOTO score will increase to 70% or better  Baseline: 60% Goal status: INITIAL  3.  Pt will be able to perform sit to stand x 5 (no UEs in < 30 sec)  Baseline: 43 sec with UE assist  Goal status: INITIAL  4.  Pt will be able to complete 2 min walk test to 350 feet or more Baseline: 288 feet Goal status: INITIAL  5.  Pt will increase hip strength to 4/5 to improve gait stability and support knees  Baseline: 3-/5 Goal status: INITIAL   PLAN:  PT FREQUENCY: 2x/week  PT DURATION: 8 weeks  PLANNED INTERVENTIONS: Therapeutic exercises, Therapeutic activity, Neuromuscular re-education, Balance training,  Gait training, Patient/Family education, Self Care, Joint mobilization, Stair training, Aquatic Therapy, Cryotherapy, Moist heat, Taping, Manual therapy, and Re-evaluation  PLAN FOR NEXT SESSION: NuStep, check HEP, progress ROM and strength . Consider aquatics after about 2-3 weeks    Jaymere Alen, PT 09/24/2022, 10:22 AM

## 2022-09-24 ENCOUNTER — Encounter: Payer: Self-pay | Admitting: Physical Therapy

## 2022-09-24 ENCOUNTER — Ambulatory Visit: Payer: Medicare Other | Attending: Rheumatology | Admitting: Physical Therapy

## 2022-09-24 DIAGNOSIS — M25561 Pain in right knee: Secondary | ICD-10-CM | POA: Diagnosis not present

## 2022-09-24 DIAGNOSIS — M170 Bilateral primary osteoarthritis of knee: Secondary | ICD-10-CM | POA: Diagnosis not present

## 2022-09-24 DIAGNOSIS — M25562 Pain in left knee: Secondary | ICD-10-CM | POA: Insufficient documentation

## 2022-09-24 DIAGNOSIS — G8929 Other chronic pain: Secondary | ICD-10-CM | POA: Diagnosis not present

## 2022-09-24 DIAGNOSIS — R262 Difficulty in walking, not elsewhere classified: Secondary | ICD-10-CM | POA: Diagnosis not present

## 2022-10-01 ENCOUNTER — Other Ambulatory Visit: Payer: Self-pay | Admitting: Family Medicine

## 2022-10-01 DIAGNOSIS — I10 Essential (primary) hypertension: Secondary | ICD-10-CM

## 2022-10-05 NOTE — Therapy (Signed)
OUTPATIENT PHYSICAL THERAPY TREATMENT NOTE   Patient Name: Rhonda Buchanan MRN: [id number] DOB:04-26-1946, 76 y.o., female Today's Date: 10/06/2022  PCP: Denita Lung, MD  REFERRING PROVIDER: Estanislado Pandy, MD  END OF SESSION:   PT End of Session - 10/06/22 0934     Visit Number 2    Number of Visits 16    Date for PT Re-Evaluation 11/19/22    Authorization Type [employer]    PT Start Time 0933    PT Stop Time 3557    PT Time Calculation (min) 42 min    Activity Tolerance Patient tolerated treatment well    Behavior During Therapy [facility] for tasks assessed/performed             Past Medical History:  Diagnosis Date   Allergy    Arthritis    Hypercholesterolemia    Hypertension    PUD (peptic ulcer disease)    Scleroderma ([place])    Past Surgical History:  Procedure Laterality Date   arthroscopy     CYST REMOVAL HAND     EYE SURGERY Left 10/25/2017   laser surgery    KNEE ARTHROPLASTY     Patient Active Problem List   Diagnosis Date Noted   Pain due to onychomycosis of toenails of both feet 08/24/2022   Callus 08/24/2022   PAD (peripheral artery disease) ([place]) 04/22/2022   Aortic atherosclerosis ([place]) 04/15/2021   High risk medication use 04/09/2020   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   Atherosclerosis of coronary artery of native heart without angina pectoris 10/05/2016   Aortic stenosis 05/21/2016   Abnormal PFTs 05/21/2016   Asthma due to environmental allergies 06/24/2015   Positive PPD, treated 06/05/2015   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/14/2014   Scleroderma ([place]) 07/12/2013   Hypertension 03/04/2011   Hyperlipidemia 03/04/2011   Allergic rhinitis 03/04/2011   Arthritis 03/04/2011   Migraine variant 04/18/1995    REFERRING DIAG: M17.0 (ICD-10-CM) - Primary osteoarthritis of both knees   THERAPY DIAG:  Chronic pain of left knee  Chronic pain of right knee  Difficulty in walking, not elsewhere classified  Rationale for Evaluation and  Treatment Rehabilitation  SUBJECTIVE:    SUBJECTIVE STATEMENT: Pt reports she has been completing her stretches and and walking 5000 steps a day, and her knees have been bothering her more.   PAIN:  Are you having pain? Yes: NPRS scale: 6/10 Pain location: bilateral knees Pain description: aching pressure  Aggravating factors: walking, stairs Relieving factors: rest, does not use ice/heat    PERTINENT HISTORY: Scleroderma, Morphea   PRECAUTIONS: None   WEIGHT BEARING RESTRICTIONS: No   FALLS:  Has patient fallen in last 6 months?  Can occasionally lose her balance   PATIENT GOALS: I want to be able to get up better improve walking and posture   OBJECTIVE: (objective measures completed at initial evaluation unless otherwise dated)   DIAGNOSTIC FINDINGS:    06/2022: Severe lateral compartment narrowing with lateral and intercondylar  osteophytes was noted.  Severe patellofemoral narrowing was noted.  No  chondrocalcinosis was noted.   Impression: These findings are consistent with severe osteoarthritis and  severe chondromalacia patella.      PATIENT SURVEYS:  FOTO 60%   COGNITION: Overall cognitive status: Within functional limits for tasks assessed                         SENSATION: WFL  Toes numb but not sure if she has neuropathy  EDEMA:  NT   MUSCLE LENGTH: NT   POSTURE: rounded shoulders, forward head, increased thoracic kyphosis, flexed trunk , and genu valgus   PALPATION: Pain medial compartment and TTP throughout peri-patellar   LOWER EXTREMITY ROM:   Active ROM Right eval Left eval  Hip flexion      Hip extension      Hip abduction      Hip adduction      Hip internal rotation      Hip external rotation      Knee flexion 102 108  Knee extension 8 11  Ankle dorsiflexion      Ankle plantarflexion      Ankle inversion      Ankle eversion       (Blank rows = not tested)   LOWER EXTREMITY MMT:   MMT Right eval Left eval  Hip  flexion 4 4  Hip extension      Hip abduction 3- 3-  Hip adduction      Hip internal rotation      Hip external rotation      Knee flexion 4+ 4+  Knee extension 4+ 4+  Ankle dorsiflexion 5 5  Ankle plantarflexion      Ankle inversion      Ankle eversion       (Blank rows = not tested)   LOWER EXTREMITY SPECIAL TESTS:  NT    FUNCTIONAL TESTS:  5 times sit to stand: 43 sec  30 seconds chair stand test Timed up and go (TUG): NT  2 minute walk test: 288 feet    GAIT: Distance walked: 150 Assistive device utilized: None Level of assistance: Modified independence Comments: trunk flexed, genu valgus      TODAY'S TREATMENT:  [facility] Adult PT Treatment:                                                DATE: 10/06/22 Therapeutic Exercise: Seated hamstring stretch x2 30" each Standing gastroc stretch x1 30" each Standing hip flexor stretch x1 30" each Supine SLR c quad set x10 each Bridging c hip abd set c RTB x10 Supine clam x10 c RTB STS x5 from mat table with use of hands and for cueing to minimize knee medial collapse NuStep 39mn L3 UE/LE Updated HEP                                                                                                                         DATE: 09/24/22   PATIENT EDUCATION:  Education details: PT/POC ,HEP  Person educated: Patient Education method: EConsulting civil engineer DMedia planner and Handouts Education comprehension: verbalized understanding, returned demonstration, and needs further education   HOME EXERCISE PROGRAM: Access Code: [email or web address]: [email or web address] Date: 10/06/2022 Prepared by: AGar Ponto Exercises - Seated Hamstring Stretch  - 1 x daily - 7 x weekly -  1 sets - 5 reps - 30 hold - Gastroc Stretch on Wall  - 1 x daily - 7 x weekly - 1 sets - 5 reps - 30 hold - Standing Hip Flexor Stretch  - 1 x daily - 7 x weekly - 1 sets - 5 reps - 30 hold - Active Straight Leg Raise with Quad Set  - 1 x daily - 7 x weekly - 1  sets - 10 reps - 3 hold - Bridge with Hip Abduction and Resistance  - 1 x daily - 7 x weekly - 1 sets - 10 reps - 3 hold - Hooklying Clamshell with Resistance  - 1 x daily - 7 x weekly - 1 sets - 10 reps - 3 hold   ASSESSMENT:   CLINICAL IMPRESSION: PT was completed for review of HEP and therex for LE flexibility and strengthening. Strengthening exs were added to her HEP. Pt returned proper demonstration of therex. Advised pt to decreased her steps per day to the level recommended of 4000. Ith verbal cueing, pt as able to come from sitting to standing with less medial knee collapse. Pt tolerated PT today without adverse effects. Pt will continue to benefit from skilled PT to address impairments for improved function.  OBJECTIVE IMPAIRMENTS: Abnormal gait, decreased activity tolerance, decreased mobility, difficulty walking, decreased ROM, decreased strength, hypomobility, increased fascial restrictions, impaired flexibility, improper body mechanics, postural dysfunction, and pain.    ACTIVITY LIMITATIONS: carrying, lifting, bending, standing, squatting, stairs, transfers, locomotion level, and caring for others   PARTICIPATION LIMITATIONS: cleaning, laundry, shopping, and community activity   PERSONAL FACTORS: Age, Time since onset of injury/illness/exacerbation, and 1-2 comorbidities: scleroderma, previous knee scopes  are also affecting patient's functional outcome.     GOALS: Goals reviewed with patient? Yes   SHORT TERM GOALS: Target date: 11/05/2022     Pt will be I with HEP for LE strengthening  Baseline: Goal status: INITIAL   2.  Pt will note greater ease of sit to stand transfers due to improved knee mobility Baseline:  Goal status: INITIAL   3.  Pt will be able to increase steps per day to 4000 Baseline: 2500-3000 steps  Goal status: INITIAL     LONG TERM GOALS: Target date: 11/19/2022     Pt will be I with final HEP  Baseline:  Goal status: INITIAL   2.  FOTO score  will increase to 70% or better  Baseline: 60% Goal status: INITIAL   3.  Pt will be able to perform sit to stand x 5 (no UEs in < 30 sec)  Baseline: 43 sec with UE assist  Goal status: INITIAL   4.  Pt will be able to complete 2 min walk test to 350 feet or more Baseline: 288 feet Goal status: INITIAL   5.  Pt will increase hip strength to 4/5 to improve gait stability and support knees  Baseline: 3-/5 Goal status: INITIAL     PLAN:   PT FREQUENCY: 2x/week   PT DURATION: 8 weeks   PLANNED INTERVENTIONS: Therapeutic exercises, Therapeutic activity, Neuromuscular re-education, Balance training, Gait training, Patient/Family education, Self Care, Joint mobilization, Stair training, Aquatic Therapy, Cryotherapy, Moist heat, Taping, Manual therapy, and Re-evaluation   PLAN FOR NEXT SESSION: NuStep, check HEP, progress ROM and strength . Consider aquatics after about 2-3 weeks     Gar Ponto, PT 10/06/2022, 2:16 PM

## 2022-10-06 ENCOUNTER — Ambulatory Visit: Payer: Medicare Other

## 2022-10-06 DIAGNOSIS — M25561 Pain in right knee: Secondary | ICD-10-CM | POA: Diagnosis not present

## 2022-10-06 DIAGNOSIS — G8929 Other chronic pain: Secondary | ICD-10-CM

## 2022-10-06 DIAGNOSIS — R262 Difficulty in walking, not elsewhere classified: Secondary | ICD-10-CM

## 2022-10-06 DIAGNOSIS — M170 Bilateral primary osteoarthritis of knee: Secondary | ICD-10-CM | POA: Diagnosis not present

## 2022-10-06 DIAGNOSIS — M25562 Pain in left knee: Secondary | ICD-10-CM | POA: Diagnosis not present

## 2022-10-06 NOTE — Progress Notes (Signed)
Office Visit Note  Patient: Rhonda Buchanan             Date of Birth: [birth year]           MRN: [id number]             PCP: Denita Lung, MD Referring: Denita Lung, MD Visit Date: 10/19/2022 Occupation: '@GUAROCC'$ @  Subjective:  Right knee joint pain   History of Present Illness: Rhonda Buchanan is a 76 y.o. female with history of scleroderma and osteoarthritis.  Patient remains on methotrexate 6 tablets by mouth once weekly and folic acid as prescribed.  She is tolerating methotrexate without any side effects and has not missed any doses recently.  She denies any signs or symptoms of progression of morphea.  She has not noticed any increase skin tightness or thickening.  She was evaluated by her dermatologist Dr. Otho Ket on 07/19/2022.  She denies any signs or symptoms of an iritis flare.  She was evaluated by Dr. Brigitte Pulse on 04/13/2022.  She states that she had an echocardiogram and updated PFTs ordered by Dr. Haroldine Laws on 08/12/2022.  She denies any new or worsening pulmonary symptoms.  She denies any increased dysphagia, GERD, or constipation.  She has not had any symptoms of Raynaud's phenomenon recently.  She continues to have persistent pain in the right knee joint and is also had some increased discomfort in her right ankle.  She has started physical therapy but has not yet noticed any improvement in her symptoms.  She denies any other joint pain or joint swelling at this time.  She denies any other new medical conditions.  She denies any recent or recurrent infections.   Activities of Daily Living:  Patient reports morning stiffness for a few minutes.   Patient Reports nocturnal pain.  Difficulty dressing/grooming: Denies Difficulty climbing stairs: Reports Difficulty getting out of chair: Reports Difficulty using hands for taps, buttons, cutlery, and/or writing: Denies  Review of Systems  Constitutional:  Negative for fatigue.  HENT:  Positive for mouth dryness. Negative for  mouth sores.   Eyes:  Positive for dryness.  Respiratory:  Negative for shortness of breath.   Cardiovascular:  Negative for chest pain and palpitations.  Gastrointestinal:  Negative for blood in stool, constipation and diarrhea.  Endocrine: Negative for increased urination.  Genitourinary:  Negative for involuntary urination.  Musculoskeletal:  Positive for joint pain, gait problem, joint pain and morning stiffness. Negative for joint swelling, myalgias, muscle weakness, muscle tenderness and myalgias.  Skin:  Negative for color change, rash, hair loss and sensitivity to sunlight.  Allergic/Immunologic: Negative for susceptible to infections.  Neurological:  Negative for dizziness and headaches.  Hematological:  Negative for swollen glands.  Psychiatric/Behavioral:  Negative for depressed mood and sleep disturbance. The patient is not nervous/anxious.     PMFS History:  Patient Active Problem List   Diagnosis Date Noted   Pain due to onychomycosis of toenails of both feet 08/24/2022   Callus 08/24/2022   PAD (peripheral artery disease) ([place]) 04/22/2022   Aortic atherosclerosis ([place]) 04/15/2021   High risk medication use 04/09/2020   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   Atherosclerosis of coronary artery of native heart without angina pectoris 10/05/2016   Aortic stenosis 05/21/2016   Abnormal PFTs 05/21/2016   Asthma due to environmental allergies 06/24/2015   Positive PPD, treated 06/05/2015   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/14/2014   Scleroderma ([place]) 07/12/2013   Hypertension 03/04/2011   Hyperlipidemia 03/04/2011  Allergic rhinitis 03/04/2011   Arthritis 03/04/2011   Migraine variant 04/18/1995    Past Medical History:  Diagnosis Date   Allergy    Arthritis    Hypercholesterolemia    Hypertension    PUD (peptic ulcer disease)    Scleroderma ([place])     Family History  Problem Relation Age of Onset   Cancer Father    Ulcers Sister    High Cholesterol  Sister    Ulcers Brother    Heart disease Brother 18       MI   Cancer Maternal Grandmother    Heart disease Maternal Grandfather    Past Surgical History:  Procedure Laterality Date   arthroscopy     CYST REMOVAL HAND     EYE SURGERY Left 10/25/2017   laser surgery    KNEE ARTHROPLASTY     Social History   Social History Narrative   Not on file   Immunization History  Administered Date(s) Administered   COVID-19, mRNA, vaccine(Comirnaty)12 years and older 08/15/2022   Fluad Quad(high Dose 65+) 12/19/2020, 09/29/2021, 08/15/2022   H1N1 08/22/2009   Influenza, High Dose Seasonal PF 06/24/2015, 10/05/2016, 08/04/2018   Influenza,inj,Quad PF,6+ Mos 07/12/2013, 01/15/2015   Influenza-Unspecified 06/24/2015, 10/05/2016, 12/24/2017, 08/04/2018   PFIZER(Purple Top)SARS-COV-2 Vaccination 12/13/2019, 01/08/2020, 10/03/2020   Pfizer Covid-19 Vaccine Bivalent Booster 53yr & up 09/29/2021   Pneumococcal Conjugate-13 05/20/2014   Pneumococcal Polysaccharide-23 01/31/2007   Tdap 01/31/2007, 12/19/2020   Zoster, Live 07/02/2008     Objective: Vital Signs: BP (!) 157/79 (BP Location: Left Arm, Patient Position: Sitting, Cuff Size: Normal)   Pulse 62   Resp 14   Ht '5\' 6"'$  (1.676 m)   Wt 172 lb 9.6 oz (78.3 kg)   LMP 02/16/2003   BMI 27.86 kg/m    Physical Exam Vitals and nursing note reviewed.  Constitutional:      Appearance: She is well-developed.  HENT:     Head: Normocephalic and atraumatic.  Eyes:     Conjunctiva/sclera: Conjunctivae normal.  Cardiovascular:     Rate and Rhythm: Normal rate and regular rhythm.     Heart sounds: Normal heart sounds.  Pulmonary:     Effort: Pulmonary effort is normal.     Breath sounds: Normal breath sounds.  Abdominal:     General: Bowel sounds are normal.     Palpations: Abdomen is soft.  Musculoskeletal:     Cervical back: Normal range of motion.  Skin:    General: Skin is warm and dry.     Capillary Refill: Capillary refill  takes less than 2 seconds.     Comments: No signs of sclerodactyly.  No digital ulcerations or signs of gangrene were noted.  Good capillary refill.  No telangiectasias noted.  Neurological:     Mental Status: She is alert and oriented to person, place, and time.  Psychiatric:        Behavior: Behavior normal.      Musculoskeletal Exam: C-spine, thoracic spine, lumbar spine have good range of motion.  Shoulder joints, elbow joints, and wrist joints have good range of motion with no discomfort.  Complete fist formation bilaterally.  No tenderness or synovitis over MCP or PIP joints.  PIP and DIP thickening consistent with osteoarthritis of both hands. Hip joints have good range of motion with no groin pain.  Right knee has pain with range of motion and mild warmth.  Left knee has good range of motion with no warmth or effusion.  Bilateral knee  crepitus noted.  Ankle joints have good range of motion with some tenderness over the right ankle.  CDAI Exam: CDAI Score: -- Patient Global: --; Provider Global: -- Swollen: --; Tender: -- Joint Exam 10/19/2022   No joint exam has been documented for this visit   There is currently no information documented on the homunculus. Go to the Rheumatology activity and complete the homunculus joint exam.  Investigation: No additional findings.  Imaging: No results found.  Recent Labs: Lab Results  Component Value Date   WBC 4.3 06/05/2021   HGB 12.9 06/05/2021   PLT 231 06/05/2021   NA 138 07/03/2021   K 3.1 (L) 07/03/2021   CL 97 07/03/2021   CO2 27 07/03/2021   GLUCOSE 103 (H) 07/03/2021   BUN 16 07/03/2021   CREATININE 1.54 (H) 07/03/2021   BILITOT 0.5 07/03/2021   ALKPHOS 73 07/03/2021   AST 25 07/03/2021   ALT 22 07/03/2021   PROT 6.6 07/03/2021   ALBUMIN 4.3 07/03/2021   CALCIUM 10.3 07/03/2021   GFRAA 62 08/01/2020    Speciality Comments: No specialty comments available.  Procedures:  No procedures performed Allergies:  Aspirin and Ibuprofen    Assessment / Plan:     Visit Diagnoses: Scleroderma ([place]) - History of morphea, Raynauds, recurrent iridocyclitis.  No sclerodactyly or inflammatory arthritis.  She has not developed any new or worsening symptoms since her last office visit.  She remains on methotrexate and folic acid as prescribed by her dermatologist-Dr. Otho Ket for management of morphea. No signs of sclerodactyly were noted.  She has not had any symptoms of Raynaud's phenomenon recently. No dysphagia, GERD, or constipation. She has not had any new or worsening pulmonary symptoms.  Updated echocardiogram and PFTs on 08/12/2022-ordered by Dr. Haroldine Laws. No synovitis or joint contractures noted on examination today. She will remain on methotrexate as prescribed.  She was advised to notify us if she develops any new or worsening symptoms.  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 Glenice Laine progression.  Patient remains on methotrexate 6 tablets once weekly prescribed by Dr. Robie Ridge was last seen in the office on 07/19/2022 and has an upcoming appointment scheduled on 12/20/2022.  High risk medication use - Methotrexate 6 tablets by mouth once weekly and folic acid 2 mg p.o. daily.  CBC and CMP updated on 07/19/22.  Orders for CBC and CMP were released today. - Plan: CBC with Differential/Platelet, COMPLETE METABOLIC PANEL WITH GFR  Positive PPD, treated  Raynaud's syndrome without gangrene: Not currently active.  No digital ulcerations or signs of gangrene were noted.  No signs of sclerodactyly noted.  Good capillary refill noted on examination today.  She was advised to notify us if she develops any new or worsening symptoms.  Recurrent acute iridocyclitis of both eyes - Evaluated by Dr. Evalee Jefferson office visit note from 04/13/2022.  No recent or recurrent flares.  Patient remains on methotrexate as prescribed.  Primary osteoarthritis of both hands: She has PIP and DIP thickening  consistent with osteoarthritis of both hands.  No tenderness or synovitis noted.  Complete fist formation bilaterally.  No contractures noted.  Primary osteoarthritis of both knees - X-rays were consistent with severe lateral compartment narrowing bilaterally and severe chondromalacia patella.  She continues to have chronic pain in the right knee joint.  She has started physical therapy but continues to have persistent discomfort. She has warmth and painful range of motion of the right knee joint on examination today.  Crepitus in both  knees noted.  Primary osteoarthritis of both feet: She has been experiencing some increased discomfort in the right ankle joint which she attributes to gait changes favoring her right knee.  She is currently going to physical therapy for lower extremity muscle strengthening.  Bilateral calcaneal spurs  Other medical conditions are listed as follows:  History of hyperlipidemia  History of hypertension: Blood pressure is 157/79 today in the office.  History of migraine  Orders: Orders Placed This Encounter  Procedures   CBC with Differential/Platelet   COMPLETE METABOLIC PANEL WITH GFR   No orders of the defined types were placed in this encounter.   Follow-Up Instructions: Return in about 5 months (around 03/20/2023) for Scleroderma, Osteoarthritis.   Ofilia Neas, PA-C  Note - This record has been created using Dragon software.  Chart creation errors have been sought, but may not always  have been located. Such creation errors do not reflect on  the standard of medical care.

## 2022-10-15 ENCOUNTER — Ambulatory Visit: Payer: Medicare Other

## 2022-10-15 DIAGNOSIS — G8929 Other chronic pain: Secondary | ICD-10-CM

## 2022-10-15 DIAGNOSIS — M25562 Pain in left knee: Secondary | ICD-10-CM | POA: Diagnosis not present

## 2022-10-15 DIAGNOSIS — M170 Bilateral primary osteoarthritis of knee: Secondary | ICD-10-CM | POA: Diagnosis not present

## 2022-10-15 DIAGNOSIS — R262 Difficulty in walking, not elsewhere classified: Secondary | ICD-10-CM | POA: Diagnosis not present

## 2022-10-15 DIAGNOSIS — M25561 Pain in right knee: Secondary | ICD-10-CM | POA: Diagnosis not present

## 2022-10-15 NOTE — Therapy (Signed)
OUTPATIENT PHYSICAL THERAPY TREATMENT NOTE   Patient Name: Rhonda Buchanan MRN: [id number] DOB:02-18-1946, 76 y.o., female Today's Date: 10/15/2022  PCP: Denita Lung, MD  REFERRING PROVIDER: Estanislado Pandy, MD  END OF SESSION:   PT End of Session - 10/15/22 1022     Visit Number 3    Number of Visits 16    Date for PT Re-Evaluation 11/19/22    Authorization Type [employer]    PT Start Time 1022    PT Stop Time 1105    PT Time Calculation (min) 43 min    Activity Tolerance Patient tolerated treatment well    Behavior During Therapy WFL for tasks assessed/performed             Past Medical History:  Diagnosis Date   Allergy    Arthritis    Hypercholesterolemia    Hypertension    PUD (peptic ulcer disease)    Scleroderma ([place])    Past Surgical History:  Procedure Laterality Date   arthroscopy     CYST REMOVAL HAND     EYE SURGERY Left 10/25/2017   laser surgery    KNEE ARTHROPLASTY     Patient Active Problem List   Diagnosis Date Noted   Pain due to onychomycosis of toenails of both feet 08/24/2022   Callus 08/24/2022   PAD (peripheral artery disease) ([place]) 04/22/2022   Aortic atherosclerosis ([place]) 04/15/2021   High risk medication use 04/09/2020   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   Atherosclerosis of coronary artery of native heart without angina pectoris 10/05/2016   Aortic stenosis 05/21/2016   Abnormal PFTs 05/21/2016   Asthma due to environmental allergies 06/24/2015   Positive PPD, treated 06/05/2015   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/14/2014   Scleroderma ([place]) 07/12/2013   Hypertension 03/04/2011   Hyperlipidemia 03/04/2011   Allergic rhinitis 03/04/2011   Arthritis 03/04/2011   Migraine variant 04/18/1995    REFERRING DIAG: M17.0 (ICD-10-CM) - Primary osteoarthritis of both knees   THERAPY DIAG:  Chronic pain of left knee  Chronic pain of right knee  Difficulty in walking, not elsewhere classified  Rationale for Evaluation and  Treatment Rehabilitation  SUBJECTIVE:    SUBJECTIVE STATEMENT: Pt reports being very sore after her last PT session. Today, she reports her pain is at a low level for her.   PAIN:  Are you having pain? Yes: NPRS scale: 5/10 Pain location: bilateral knees Pain description: aching pressure  Aggravating factors: walking, stairs Relieving factors: rest, does not use ice/heat    PERTINENT HISTORY: Scleroderma, Morphea   PRECAUTIONS: None   WEIGHT BEARING RESTRICTIONS: No   FALLS:  Has patient fallen in last 6 months?  Can occasionally lose her balance   PATIENT GOALS: I want to be able to get up better improve walking and posture   OBJECTIVE: (objective measures completed at initial evaluation unless otherwise dated)   DIAGNOSTIC FINDINGS:    06/2022: Severe lateral compartment narrowing with lateral and intercondylar  osteophytes was noted.  Severe patellofemoral narrowing was noted.  No  chondrocalcinosis was noted.   Impression: These findings are consistent with severe osteoarthritis and  severe chondromalacia patella.      PATIENT SURVEYS:  FOTO 60%   COGNITION: Overall cognitive status: Within functional limits for tasks assessed                         SENSATION: WFL  Toes numb but not sure if she has neuropathy  EDEMA:  NT   MUSCLE LENGTH: NT   POSTURE: rounded shoulders, forward head, increased thoracic kyphosis, flexed trunk , and genu valgus   PALPATION: Pain medial compartment and TTP throughout peri-patellar   LOWER EXTREMITY ROM:   Active ROM Right eval Left eval  Hip flexion      Hip extension      Hip abduction      Hip adduction      Hip internal rotation      Hip external rotation      Knee flexion 102 108  Knee extension 8 11  Ankle dorsiflexion      Ankle plantarflexion      Ankle inversion      Ankle eversion       (Blank rows = not tested)   LOWER EXTREMITY MMT:   MMT Right eval Left eval  Hip flexion 4 4  Hip  extension      Hip abduction 3- 3-  Hip adduction      Hip internal rotation      Hip external rotation      Knee flexion 4+ 4+  Knee extension 4+ 4+  Ankle dorsiflexion 5 5  Ankle plantarflexion      Ankle inversion      Ankle eversion       (Blank rows = not tested)   LOWER EXTREMITY SPECIAL TESTS:  NT    FUNCTIONAL TESTS:  5 times sit to stand: 43 sec  30 seconds chair stand test Timed up and go (TUG): NT  2 minute walk test: 288 feet    GAIT: Distance walked: 150 Assistive device utilized: None Level of assistance: Modified independence Comments: trunk flexed, genu valgus      TODAY'S TREATMENT:  [facility] Adult PT Treatment:                                                DATE: 10/15/22 Therapeutic Exercise: Seated hamstring stretch x2 30" each Standing gastroc stretch x2 30" each Standing hip flexor stretch x2 30" each Supine hip/knee flex/ext x5 each Supine SLR c quad set x5 each Bridging c hip abd set c RTB x10 Supine clam x10 c RTB NuStep 19mn L3 UE/LE  [facility] Adult PT Treatment:                                                DATE: 10/06/22 Therapeutic Exercise: Seated hamstring stretch x2 30" each Standing gastroc stretch x1 30" each Standing hip flexor stretch x1 30" each Supine SLR c quad set x10 each Bridging c hip abd set c RTB x10 Supine clam x10 c RTB STS x5 from mat table with use of hands and for cueing to minimize knee medial collapse NuStep 57m L3 UE/LE Updated HEP  DATE: 09/24/22   PATIENT EDUCATION:  Education details: PT/POC/HEP  Person educated: Patient Education method: Explanation, Demonstration, and Handouts Education comprehension: verbalized understanding, returned demonstration, and needs further education   HOME EXERCISE PROGRAM: Access Code: [email or web address] URL: [email or web address] Date:  10/15/2022 Prepared by: Gar Ponto  Exercises - Seated Hamstring Stretch  - 1 x daily - 7 x weekly - 1 sets - 3 reps - 30 hold - Gastroc Stretch on Wall  - 1 x daily - 7 x weekly - 1 sets - 3 reps - 30 hold - Hip Flexion  - 1 x daily - 7 x weekly - 1 sets - 5 reps - 3 hold - Standing Hip Flexor Stretch  - 1 x daily - 7 x weekly - 1 sets - 3 reps - 30 hold - Active Straight Leg Raise with Quad Set  - 1 x daily - 7 x weekly - 1 sets - 5-10 reps - 3 hold - Bridge with Hip Abduction and Resistance  - 1 x daily - 7 x weekly - 1 sets - 5-10 reps - 3 hold - Hooklying Clamshell with Resistance  - 1 x daily - 7 x weekly - 1 sets - 10 reps - 3 hold   ASSESSMENT:   CLINICAL IMPRESSION: Pt reports being very sore after the last PT session. PT was completed today for LE strengthening and flexibility with trying to manage to pt's tolerance. Pt experiences her most significant pain at the end range of R knee flexion. Pt demonstrates proper completion of her therex and reports completing consistently. Pt tolerated PT today without adverse effects. Pt will continue to benefit from skilled PT to address impairments for improved function.  OBJECTIVE IMPAIRMENTS: Abnormal gait, decreased activity tolerance, decreased mobility, difficulty walking, decreased ROM, decreased strength, hypomobility, increased fascial restrictions, impaired flexibility, improper body mechanics, postural dysfunction, and pain.    ACTIVITY LIMITATIONS: carrying, lifting, bending, standing, squatting, stairs, transfers, locomotion level, and caring for others   PARTICIPATION LIMITATIONS: cleaning, laundry, shopping, and community activity   PERSONAL FACTORS: Age, Time since onset of injury/illness/exacerbation, and 1-2 comorbidities: scleroderma, previous knee scopes  are also affecting patient's functional outcome.     GOALS: Goals reviewed with patient? Yes   SHORT TERM GOALS: Target date: 11/05/2022     Pt will be I with HEP  for LE strengthening  Baseline: Goal status: Ongoing   2.  Pt will note greater ease of sit to stand transfers due to improved knee mobility Baseline:  Goal status: INITIAL   3.  Pt will be able to increase steps per day to 4000 Baseline: 2500-3000 steps  Goal status: INITIAL     LONG TERM GOALS: Target date: 11/19/2022     Pt will be I with final HEP  Baseline:  Goal status: INITIAL   2.  FOTO score will increase to 70% or better  Baseline: 60% Goal status: INITIAL   3.  Pt will be able to perform sit to stand x 5 (no UEs in < 30 sec)  Baseline: 43 sec with UE assist  Goal status: INITIAL   4.  Pt will be able to complete 2 min walk test to 350 feet or more Baseline: 288 feet Goal status: INITIAL   5.  Pt will increase hip strength to 4/5 to improve gait stability and support knees  Baseline: 3-/5 Goal status: INITIAL     PLAN:   PT FREQUENCY: 2x/week   PT DURATION: 8 weeks   PLANNED  INTERVENTIONS: Therapeutic exercises, Therapeutic activity, Neuromuscular re-education, Balance training, Gait training, Patient/Family education, Self Care, Joint mobilization, Stair training, Aquatic Therapy, Cryotherapy, Moist heat, Taping, Manual therapy, and Re-evaluation   PLAN FOR NEXT SESSION: NuStep, check HEP, progress ROM and strength . Consider aquatics after about 2-3 weeks    Gar Ponto MS, PT 10/15/22 11:39 AM

## 2022-10-19 ENCOUNTER — Ambulatory Visit: Payer: Medicare Other | Attending: Rheumatology | Admitting: Physician Assistant

## 2022-10-19 ENCOUNTER — Encounter: Payer: Self-pay | Admitting: Physician Assistant

## 2022-10-19 VITALS — BP 157/79 | HR 62 | Resp 14 | Ht 66.0 in | Wt 172.6 lb

## 2022-10-19 DIAGNOSIS — M170 Bilateral primary osteoarthritis of knee: Secondary | ICD-10-CM

## 2022-10-19 DIAGNOSIS — Z8679 Personal history of other diseases of the circulatory system: Secondary | ICD-10-CM

## 2022-10-19 DIAGNOSIS — M19071 Primary osteoarthritis, right ankle and foot: Secondary | ICD-10-CM | POA: Diagnosis not present

## 2022-10-19 DIAGNOSIS — L940 Localized scleroderma [morphea]: Secondary | ICD-10-CM

## 2022-10-19 DIAGNOSIS — M19072 Primary osteoarthritis, left ankle and foot: Secondary | ICD-10-CM

## 2022-10-19 DIAGNOSIS — Z79899 Other long term (current) drug therapy: Secondary | ICD-10-CM | POA: Diagnosis not present

## 2022-10-19 DIAGNOSIS — H40023 Open angle with borderline findings, high risk, bilateral: Secondary | ICD-10-CM | POA: Diagnosis not present

## 2022-10-19 DIAGNOSIS — H04123 Dry eye syndrome of bilateral lacrimal glands: Secondary | ICD-10-CM | POA: Diagnosis not present

## 2022-10-19 DIAGNOSIS — M19041 Primary osteoarthritis, right hand: Secondary | ICD-10-CM | POA: Diagnosis not present

## 2022-10-19 DIAGNOSIS — Z8669 Personal history of other diseases of the nervous system and sense organs: Secondary | ICD-10-CM

## 2022-10-19 DIAGNOSIS — I7300 Raynaud's syndrome without gangrene: Secondary | ICD-10-CM | POA: Diagnosis not present

## 2022-10-19 DIAGNOSIS — M7731 Calcaneal spur, right foot: Secondary | ICD-10-CM | POA: Diagnosis not present

## 2022-10-19 DIAGNOSIS — Z8639 Personal history of other endocrine, nutritional and metabolic disease: Secondary | ICD-10-CM | POA: Diagnosis not present

## 2022-10-19 DIAGNOSIS — M19042 Primary osteoarthritis, left hand: Secondary | ICD-10-CM

## 2022-10-19 DIAGNOSIS — H20023 Recurrent acute iridocyclitis, bilateral: Secondary | ICD-10-CM

## 2022-10-19 DIAGNOSIS — M349 Systemic sclerosis, unspecified: Secondary | ICD-10-CM | POA: Diagnosis not present

## 2022-10-19 DIAGNOSIS — H40053 Ocular hypertension, bilateral: Secondary | ICD-10-CM | POA: Diagnosis not present

## 2022-10-19 DIAGNOSIS — R7611 Nonspecific reaction to tuberculin skin test without active tuberculosis: Secondary | ICD-10-CM

## 2022-10-19 DIAGNOSIS — H2013 Chronic iridocyclitis, bilateral: Secondary | ICD-10-CM | POA: Diagnosis not present

## 2022-10-19 DIAGNOSIS — H47011 Ischemic optic neuropathy, right eye: Secondary | ICD-10-CM | POA: Diagnosis not present

## 2022-10-19 DIAGNOSIS — H40043 Steroid responder, bilateral: Secondary | ICD-10-CM | POA: Diagnosis not present

## 2022-10-19 DIAGNOSIS — M7732 Calcaneal spur, left foot: Secondary | ICD-10-CM

## 2022-10-20 LAB — COMPLETE METABOLIC PANEL WITH GFR
AG Ratio: 1.6 (calc) (ref 1.0–2.5)
ALT: 25 U/L (ref 6–29)
AST: 29 U/L (ref 10–35)
Albumin: 4.5 g/dL (ref 3.6–5.1)
Alkaline phosphatase (APISO): 73 U/L (ref 37–153)
BUN/Creatinine Ratio: 17 (calc) (ref 6–22)
BUN: 19 mg/dL (ref 7–25)
CO2: 32 mmol/L (ref 20–32)
Calcium: 10.7 mg/dL — ABNORMAL HIGH (ref 8.6–10.4)
Chloride: 103 mmol/L (ref 98–110)
Creat: 1.12 mg/dL — ABNORMAL HIGH (ref 0.60–1.00)
Globulin: 2.8 g/dL (calc) (ref 1.9–3.7)
Glucose, Bld: 77 mg/dL (ref 65–99)
Potassium: 3.5 mmol/L (ref 3.5–5.3)
Sodium: 143 mmol/L (ref 135–146)
Total Bilirubin: 0.6 mg/dL (ref 0.2–1.2)
Total Protein: 7.3 g/dL (ref 6.1–8.1)
eGFR: 51 mL/min/{1.73_m2} — ABNORMAL LOW (ref >=60)

## 2022-10-20 LAB — CBC WITH DIFFERENTIAL/PLATELET
Absolute Monocytes: 594 cells/uL (ref 200–950)
Basophils Absolute: 39 cells/uL (ref 0–200)
Basophils Relative: 0.7 %
Eosinophils Absolute: 202 cells/uL (ref 15–500)
Eosinophils Relative: 3.6 %
HCT: 38.9 % (ref 35.0–45.0)
Hemoglobin: 13.4 g/dL (ref 11.7–15.5)
Lymphs Abs: 1697 cells/uL (ref 850–3900)
MCH: 32.7 pg (ref 27.0–33.0)
MCHC: 34.4 g/dL (ref 32.0–36.0)
MCV: 94.9 fL (ref 80.0–100.0)
MPV: 11.4 fL (ref 7.5–12.5)
Monocytes Relative: 10.6 %
Neutro Abs: 3069 cells/uL (ref 1500–7800)
Neutrophils Relative %: 54.8 %
Platelets: 192 10*3/uL (ref 140–400)
RBC: 4.1 10*6/uL (ref 3.80–5.10)
RDW: 13.9 % (ref 11.0–15.0)
Total Lymphocyte: 30.3 %
WBC: 5.6 10*3/uL (ref 3.8–10.8)

## 2022-10-20 NOTE — Progress Notes (Signed)
CBC WNL.  Creatinine is slightly elevated but has improved-1.12 and GFR is low-51.   Calcium is slightly elevated-10.7. avoid calcium supplement. rest of CMP WNL.

## 2022-10-21 ENCOUNTER — Ambulatory Visit: Payer: Medicare Other | Attending: Rheumatology

## 2022-10-21 DIAGNOSIS — G8929 Other chronic pain: Secondary | ICD-10-CM | POA: Insufficient documentation

## 2022-10-21 DIAGNOSIS — R262 Difficulty in walking, not elsewhere classified: Secondary | ICD-10-CM | POA: Diagnosis not present

## 2022-10-21 DIAGNOSIS — M25561 Pain in right knee: Secondary | ICD-10-CM | POA: Diagnosis not present

## 2022-10-21 DIAGNOSIS — M25562 Pain in left knee: Secondary | ICD-10-CM | POA: Diagnosis not present

## 2022-10-21 NOTE — Therapy (Signed)
OUTPATIENT PHYSICAL THERAPY TREATMENT NOTE   Patient Name: Rhonda Buchanan MRN: [id number] DOB:11-14-1945, 77 y.o., female Today's Date: 10/21/2022  PCP: Denita Lung, MD  REFERRING PROVIDER: Estanislado Pandy, MD  END OF SESSION:   PT End of Session - 10/21/22 1437     Visit Number 4    Number of Visits 16    Date for PT Re-Evaluation 11/19/22    Authorization Type [employer]    PT Start Time 1418    PT Stop Time 1500    PT Time Calculation (min) 42 min    Activity Tolerance Patient tolerated treatment well    Behavior During Therapy WFL for tasks assessed/performed              Past Medical History:  Diagnosis Date   Allergy    Arthritis    Hypercholesterolemia    Hypertension    PUD (peptic ulcer disease)    Scleroderma ([place])    Past Surgical History:  Procedure Laterality Date   arthroscopy     CYST REMOVAL HAND     EYE SURGERY Left 10/25/2017   laser surgery    KNEE ARTHROPLASTY     Patient Active Problem List   Diagnosis Date Noted   Pain due to onychomycosis of toenails of both feet 08/24/2022   Callus 08/24/2022   PAD (peripheral artery disease) (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   Atherosclerosis of coronary artery of native heart without angina pectoris 10/05/2016   Aortic stenosis 05/21/2016   Abnormal PFTs 05/21/2016   Asthma due to environmental allergies 06/24/2015   Positive PPD, treated 06/05/2015   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/14/2014   Scleroderma ([place]) 07/12/2013   Hypertension 03/04/2011   Hyperlipidemia 03/04/2011   Allergic rhinitis 03/04/2011   Arthritis 03/04/2011   Migraine variant 04/18/1995    REFERRING DIAG: M17.0 (ICD-10-CM) - Primary osteoarthritis of both knees   THERAPY DIAG:  Chronic pain of left knee  Chronic pain of right knee  Difficulty in walking, not elsewhere classified  Rationale for Evaluation and  Treatment Rehabilitation  SUBJECTIVE:    SUBJECTIVE STATEMENT: Pt reports increased soreness after PT, but it decreases. Pt notes her R ankle hurts as much as her knees.   PAIN:  Are you having pain? Yes: NPRS scale: 7/10 Pain location: bilateral knees Pain description: aching pressure  Aggravating factors: walking, stairs Relieving factors: rest, does not use ice/heat    PERTINENT HISTORY: Scleroderma, Morphea   PRECAUTIONS: None   WEIGHT BEARING RESTRICTIONS: No   FALLS:  Has patient fallen in last 6 months?  Can occasionally lose her balance   PATIENT GOALS: I want to be able to get up better improve walking and posture   OBJECTIVE: (objective measures completed at initial evaluation unless otherwise dated)   DIAGNOSTIC FINDINGS:    06/2022: Severe lateral compartment narrowing with lateral and intercondylar  osteophytes was noted.  Severe patellofemoral narrowing was noted.  No  chondrocalcinosis was noted.   Impression: These findings are consistent with severe osteoarthritis and  severe chondromalacia patella.      PATIENT SURVEYS:  FOTO 60%   COGNITION: Overall cognitive status: Within functional limits for tasks assessed                         SENSATION: WFL  Toes numb but not sure if she has neuropathy  EDEMA:  NT   MUSCLE LENGTH: NT   POSTURE: rounded shoulders, forward head, increased thoracic kyphosis, flexed trunk , and genu valgus   PALPATION: Pain medial compartment and TTP throughout peri-patellar   LOWER EXTREMITY ROM:   Active ROM Right eval Left eval  Hip flexion      Hip extension      Hip abduction      Hip adduction      Hip internal rotation      Hip external rotation      Knee flexion 102 108  Knee extension 8 11  Ankle dorsiflexion      Ankle plantarflexion      Ankle inversion      Ankle eversion       (Blank rows = not tested)   LOWER EXTREMITY MMT:   MMT Right eval Left eval  Hip flexion 4 4  Hip  extension      Hip abduction 3- 3-  Hip adduction      Hip internal rotation      Hip external rotation      Knee flexion 4+ 4+  Knee extension 4+ 4+  Ankle dorsiflexion 5 5  Ankle plantarflexion      Ankle inversion      Ankle eversion       (Blank rows = not tested)   LOWER EXTREMITY SPECIAL TESTS:  NT    FUNCTIONAL TESTS:  5 times sit to stand: 43 sec  30 seconds chair stand test Timed up and go (TUG): NT  2 minute walk test: 288 feet    GAIT: Distance walked: 150 Assistive device utilized: None Level of assistance: Modified independence Comments: trunk flexed, genu valgus      TODAY'S TREATMENT:  [facility] Adult PT Treatment:                                                DATE: 10/21/22 Therapeutic Exercise: NuStep 41mn L3 UE/LE Seated hamstring stretch x2 30" each Standing gastroc/hip flexor stretches x2 30" each Supine hip/knee flex/ext x5 each Supine SLR c quad set 2x5 each Supine clam x10 c RTB STS from bari-mat table 2x5  [facility]                                                [facility] Therapeutic Exercise: Seated hamstring stretch x2 30" each Standing gastroc stretch x2 30" each Standing hip flexor stretch x2 30" each Supine hip/knee flex/ext x5 each Supine SLR c quad set x5 each Bridging c hip abd set c RTB x10 Supine clam x10 c RTB NuStep 580m L3 UE/LE  [facility] Adult PT Treatment:                                                DATE: 10/06/22 Therapeutic Exercise: Seated hamstring stretch x2 30" each Standing gastroc stretch x1 30" each Standing hip flexor stretch x1 30" each Supine SLR c quad set x10 each Bridging c hip abd set c RTB x10 Supine clam x10 c RTB STS x5 from mat table with  use of hands and for cueing to minimize knee medial collapse NuStep 53mn L3 UE/LE Updated HEP                                                                                                                         PATIENT EDUCATION:  Education  details: PT/POC/HEP  Person educated: Patient Education method: Explanation, Demonstration, and Handouts Education comprehension: verbalized understanding, returned demonstration, and needs further education   HOME EXERCISE PROGRAM: Access Code: [email or web address]: [email or web address] Date: 10/21/2022 Prepared by: AGar Ponto Exercises - Seated Hamstring Stretch  - 1 x daily - 7 x weekly - 1 sets - 3 reps - 30 hold - Gastroc Stretch on Wall  - 1 x daily - 7 x weekly - 1 sets - 3 reps - 30 hold - Hip Flexion  - 1 x daily - 7 x weekly - 1 sets - 5 reps - 3 hold - Standing Hip Flexor Stretch  - 1 x daily - 7 x weekly - 1 sets - 3 reps - 30 hold - Active Straight Leg Raise with Quad Set  - 1 x daily - 7 x weekly - 1 sets - 5-10 reps - 3 hold - Bridge with Hip Abduction and Resistance  - 1 x daily - 7 x weekly - 1 sets - 5-10 reps - 3 hold - Hooklying Clamshell with Resistance  - 1 x daily - 7 x weekly - 1 sets - 10 reps - 3 hold - Sit to Stand with Counter Support  - 1 x daily - 7 x weekly - 2 sets - 5 reps - Seated Long Arc Quad  - 1 x daily - 7 x weekly - 2 sets - 10 reps - 3 hold   ASSESSMENT:   CLINICAL IMPRESSION: PT was continued for bilat Knee/LE flexibility and strengthening therex. Pt demonstrates proper understanding of her HEP. Pt's daily walking steps has increased, while the level of difficulty with coming from sitting to stand has not improved. Pt's increase in knee pain with the initiation of PT to improve ROM, strength and function is an anticipated response. Pt is participating with good effort. Pt will continue to benefit from skilled PT to address impairments for improved functional mobility.  OBJECTIVE IMPAIRMENTS: Abnormal gait, decreased activity tolerance, decreased mobility, difficulty walking, decreased ROM, decreased strength, hypomobility, increased fascial restrictions, impaired flexibility, improper body mechanics, postural dysfunction, and pain.     ACTIVITY LIMITATIONS: carrying, lifting, bending, standing, squatting, stairs, transfers, locomotion level, and caring for others   PARTICIPATION LIMITATIONS: cleaning, laundry, shopping, and community activity   PERSONAL FACTORS: Age, Time since onset of injury/illness/exacerbation, and 1-2 comorbidities: scleroderma, previous knee scopes  are also affecting patient's functional outcome.     GOALS: Goals reviewed with patient? Yes   SHORT TERM GOALS: Target date: 11/05/2022     Pt will be I with HEP for LE strengthening  Baseline: Status: Ind Goal status: MET   2.  Pt  will note greater ease of sit to stand transfers due to improved knee mobility Baseline: Status: 10/21/22= Pt reports effort level for STS is the same Goal status: Not MET   3.  Pt will be able to increase steps per day to 4000 Baseline: 2 [phone] steps  Status: 10/21/22= 4000-5000 per pt report Goal status: MET     LONG TERM GOALS: Target date: 11/19/2022     Pt will be I with final HEP  Baseline:  Goal status: INITIAL   2.  FOTO score will increase to 70% or better  Baseline: 60% Goal status: INITIAL   3.  Pt will be able to perform sit to stand x 5 (no UEs in < 30 sec)  Baseline: 43 sec with UE assist  Goal status: INITIAL   4.  Pt will be able to complete 2 min walk test to 350 feet or more Baseline: 288 feet Goal status: INITIAL   5.  Pt will increase hip strength to 4/5 to improve gait stability and support knees  Baseline: 3-/5 Goal status: INITIAL     PLAN:   PT FREQUENCY: 2x/week   PT DURATION: 8 weeks   PLANNED INTERVENTIONS: Therapeutic exercises, Therapeutic activity, Neuromuscular re-education, Balance training, Gait training, Patient/Family education, Self Care, Joint mobilization, Stair training, Aquatic Therapy, Cryotherapy, Moist heat, Taping, Manual therapy, and Re-evaluation   PLAN FOR NEXT SESSION: NuStep, check HEP, progress ROM and strength . Consider aquatics after about  2-3 weeks    Gar Ponto MS, PT 10/21/22 9:41 PM

## 2022-10-22 ENCOUNTER — Ambulatory Visit: Payer: Medicare Other | Admitting: Physician Assistant

## 2022-10-22 ENCOUNTER — Ambulatory Visit (HOSPITAL_COMMUNITY)
Admission: RE | Admit: 2022-10-22 | Discharge: 2022-10-22 | Disposition: A | Discharge status: Home / Self Care | Payer: Medicare Other | Source: Ambulatory Visit | Attending: Vascular Surgery | Admitting: Vascular Surgery

## 2022-10-22 ENCOUNTER — Other Ambulatory Visit: Payer: Self-pay | Admitting: *Deleted

## 2022-10-22 VITALS — BP 155/78 | HR 65 | Temp 98.0°F | Resp 20 | Ht 66.0 in | Wt 168.3 lb

## 2022-10-22 DIAGNOSIS — I739 Peripheral vascular disease, unspecified: Secondary | ICD-10-CM

## 2022-10-22 NOTE — Progress Notes (Signed)
Established PAD   History of Present Illness   Rhonda Buchanan is a 77 y.o. (07/14/1946) female who presents for surveillance of PAD. She was first seen by our office in 2022 with incidental findings of severe aortic atherosclerosis.  She had a history of nocturnal abdominal cramping with abdominal bruit, concerning for possible aneurysm.  No aneurysm was found on imaging, but aortic atherosclerosis was found.  She has never had any issues with postprandial abdominal pain or weight loss.  She also denies any symptoms of claudication, rest pain, or tissue loss.  She does have a history of bilateral knee pain due to osteoarthritis.  She is also followed by a rheumatologist for scleroderma.   At follow-up today, she states she is doing okay.  She was recently started on gabapentin for bilateral occasional toe numbness.  Her rheumatologist also referred her for physical therapy in the past year, in anticipation of needing future knee replacements.  Previous x-rays demonstrate severe osteoarthritis in the knees bilaterally.  The patient states that she has had bilateral anterior shin and calf pain for the past 3 months.  This pain is burning/cramping and hot in nature.  This pain extends from her knees to her ankles.  It worsens with walking but is also present at rest and at night.  The right leg is worse than the left and will occasionally wake her from her sleep.  She does not think that her gabapentin helps with this pain.  She occasionally will massage her right leg, which partly alleviates her pain.  She denies any rest pain in her feet or wounds of the lower extremities.  Current Outpatient Medications  Medication Sig Dispense Refill   Brinzolamide-Brimonidine 1-0.2 % SUSP Apply 1 drop to eye 3 (three) times daily.      calcium carbonate (OS-CAL) 600 MG TABS Take 600 mg by mouth daily.     fluocinonide ointment (LIDEX) 0.05 % as needed.     folic acid (FOLVITE) 1 MG tablet Take 3 mg by mouth  daily.      gabapentin (NEURONTIN) 100 MG capsule Take 1 capsule (100 mg total) by mouth at bedtime. 90 capsule 0   lisinopril-hydrochlorothiazide (ZESTORETIC) 10-12.5 MG tablet Take 1 tablet by mouth daily. 90 tablet 3   loratadine (CLARITIN) 10 MG tablet Take 10 mg by mouth as needed.     methotrexate (RHEUMATREX) 2.5 MG tablet TAKE( 6) '15MG'$  ONCE A WEEK Caution:Chemotherapy. Protect from light.     metoprolol succinate (TOPROL-XL) 50 MG 24 hr tablet TAKE 1/2 TABLET BY MOUTH DAILY. TAKE WITH OR IMMEDIATELY FOLLOWING A MEAL. 45 tablet 1   multivitamin (THERAGRAN) per tablet Take 1 tablet by mouth daily.     nystatin (MYCOSTATIN/NYSTOP) powder Apply to moist areas as needed     prednisoLONE acetate (PRED FORTE) 1 % ophthalmic suspension Place 1 drop into both eyes daily.      rosuvastatin (CRESTOR) 40 MG tablet Take 1 tablet (40 mg total) by mouth daily. 90 tablet 3   SYSTANE ULTRA 0.4-0.3 % SOLN SMARTSIG:1 Drop(s) In Eye(s) PRN     timolol (BETIMOL) 0.5 % ophthalmic solution Place 1 drop into both eyes daily.     No current facility-administered medications for this visit.    REVIEW OF SYSTEMS (negative unless checked):   Cardiac:  '[]'$  Chest pain or chest pressure? '[]'$  Shortness of breath upon activity? '[]'$  Shortness of breath when lying flat? '[]'$  Irregular heart rhythm?  Vascular:  '[x]'$  Pain in  calf, thigh, or hip brought on by walking? '[]'$  Pain in feet at night that wakes you up from your sleep? '[]'$  Blood clot in your veins? '[]'$  Leg swelling?  Pulmonary:  '[]'$  Oxygen at home? '[]'$  Productive cough? '[]'$  Wheezing?  Neurologic:  '[]'$  Sudden weakness in arms or legs? '[]'$  Sudden numbness in arms or legs? '[]'$  Sudden onset of difficult speaking or slurred speech? '[]'$  Temporary loss of vision in one eye? '[]'$  Problems with dizziness?  Gastrointestinal:  '[]'$  Blood in stool? '[]'$  Vomited blood?  Genitourinary:  '[]'$  Burning when urinating? '[]'$  Blood in urine?  Psychiatric:  '[]'$  Major  depression  Hematologic:  '[]'$  Bleeding problems? '[]'$  Problems with blood clotting?  Dermatologic:  '[]'$  Rashes or ulcers?  Constitutional:  '[]'$  Fever or chills?  Ear/Nose/Throat:  '[]'$  Change in hearing? '[]'$  Nose bleeds? '[]'$  Sore throat?  Musculoskeletal:  '[]'$  Back pain? '[x]'$  Joint pain? '[]'$  Muscle pain?   Physical Examination   Vitals:   10/22/22 1026  BP: (!) 155/78  Pulse: 65  Resp: 20  Temp: 98 F (36.7 C)  TempSrc: Temporal  SpO2: 100%  Weight: 168 lb 4.8 oz (76.3 kg)  Height: '5\' 6"'$  (1.676 m)   Body mass index is 27.16 kg/m.  General:  WDWN in NAD; vital signs documented above Gait: Not observed HENT: WNL, normocephalic Pulmonary: normal non-labored breathing , Cardiac: regular rate and rhythm, no carotid bruit Abdomen: soft, NT, no masses Skin: without rashes Vascular Exam/Pulses: 1+ DP pulses bilaterally Extremities: without ischemic changes, without gangrene , without cellulitis; without open wounds Musculoskeletal: no muscle wasting or atrophy  Neurologic: A&O X 3;  No focal weakness or paresthesias are detected Psychiatric:  The pt has Normal affect.  Non-Invasive Vascular imaging   ABI (10/22/2022) +---------+------------------+-----+---------+--------+  Right   Rt Pressure (mmHg)IndexWaveform Comment   +---------+------------------+-----+---------+--------+  Brachial 132                                       +---------+------------------+-----+---------+--------+  PTA     141               1.07 triphasic          +---------+------------------+-----+---------+--------+  DP      136               1.03 biphasic           +---------+------------------+-----+---------+--------+  Great Toe91                0.69 Normal             +---------+------------------+-----+---------+--------+   +---------+------------------+-----+---------+-------+  Left    Lt Pressure (mmHg)IndexWaveform Comment   +---------+------------------+-----+---------+-------+  Brachial 130                                      +---------+------------------+-----+---------+-------+  PTA     140               1.06 triphasic         +---------+------------------+-----+---------+-------+  DP      130               0.98 biphasic          +---------+------------------+-----+---------+-------+  Ellen Henri               0.85 Normal            +---------+------------------+-----+---------+-------+   +-------+-----------+-----------+------------+------------+  ABI/TBIToday's ABIToday's TBIPrevious ABIPrevious TBI  +-------+-----------+-----------+------------+------------+  Right 1.07       0.69       0.84        0.65          +-------+-----------+-----------+------------+------------+  Left  1.06       0.85       0.83        0.50          +-------+-----------+-----------+------------+------------+    Medical Decision Making   Rhonda Buchanan is a 77 y.o. female who presents for surveillance of PAD  Based on the patient's vascular studies, her ABIs bilaterally appear increased since 1 year ago. R ABI was 0.84 and is now 1.07. L ABI was 0.83 and is now 1.06. TBIs are also increased. There is triphasic PT and biphasic DP flow bilaterally On exam the patient has no wounds. Her feet are warm and well perfused with palpable DP 1+ pulses The patient describes a 3-4 month history of bilateral calf and shin burning/aching pains. These pains extend from her knees to ankles and are present at rest and with exercise. The right is worse than the left and wakes her up from sleep. She has a history of severe knee osteoarthritis and has been previously told she will likely need knee replacements. She is currently going through PT. Based of the location and burning description of her pain, and given that she has palpable pulses distally I do not believe that this pain is arterial in  nature. I have offered the patient a referral to orthopedics for workup of her pain, likely orthopedic and/or neuropathic in nature. She will follow up with our office in 1 yr with repeat ABIs. She will continue taking a statin. She cannot tolerate ASA due to GI upset   Vicente Serene PA-C [facility] Office: [facility] MD: Virl Cagey

## 2022-10-25 ENCOUNTER — Ambulatory Visit: Payer: Medicare Other | Admitting: Physical Therapy

## 2022-10-25 DIAGNOSIS — G8929 Other chronic pain: Secondary | ICD-10-CM | POA: Diagnosis not present

## 2022-10-25 DIAGNOSIS — M25562 Pain in left knee: Secondary | ICD-10-CM | POA: Diagnosis not present

## 2022-10-25 DIAGNOSIS — M25561 Pain in right knee: Secondary | ICD-10-CM | POA: Diagnosis not present

## 2022-10-25 DIAGNOSIS — R262 Difficulty in walking, not elsewhere classified: Secondary | ICD-10-CM | POA: Diagnosis not present

## 2022-10-25 NOTE — Therapy (Signed)
OUTPATIENT PHYSICAL THERAPY TREATMENT NOTE   Patient Name: Rhonda Buchanan MRN: [id number] DOB:11-25-1945, 77 y.o., female Today's Date: 10/25/2022  PCP: Denita Lung, MD  REFERRING PROVIDER: Estanislado Pandy, MD  END OF SESSION:   PT End of Session - 10/25/22 1453     Visit Number 5    Number of Visits 16    Date for PT Re-Evaluation 11/19/22    Authorization Type [employer]    PT Start Time 1418    PT Stop Time 1505    PT Time Calculation (min) 47 min    Activity Tolerance Patient tolerated treatment well;Patient limited by pain    Behavior During Therapy [facility] for tasks assessed/performed               Past Medical History:  Diagnosis Date   Allergy    Arthritis    Hypercholesterolemia    Hypertension    PUD (peptic ulcer disease)    Scleroderma ([place])    Past Surgical History:  Procedure Laterality Date   arthroscopy     CYST REMOVAL HAND     EYE SURGERY Left 10/25/2017   laser surgery    KNEE ARTHROPLASTY     Patient Active Problem List   Diagnosis Date Noted   Pain due to onychomycosis of toenails of both feet 08/24/2022   Callus 08/24/2022   PAD (peripheral artery disease) ([place]) 04/22/2022   Aortic atherosclerosis ([place]) 04/15/2021   High risk medication use 04/09/2020   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   Atherosclerosis of coronary artery of native heart without angina pectoris 10/05/2016   Aortic stenosis 05/21/2016   Abnormal PFTs 05/21/2016   Asthma due to environmental allergies 06/24/2015   Positive PPD, treated 06/05/2015   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   Right now the knees are hurting 7/10 and it is worse bending to get in and out of the car.  She will be seeing an orthopedic MD and has had injections in the past from Rheumatologist.    PAIN:  Are you having pain? Yes: NPRS scale: 7/10 Pain location: bilateral knees Pain description: aching pressure  Aggravating factors: walking, stairs Relieving factors: rest, does not use ice/heat    PERTINENT HISTORY: Scleroderma, Morphea Bilateral arthroscopic knee surgery 13 yrs approx    PRECAUTIONS: None   WEIGHT BEARING RESTRICTIONS: No   FALLS:  Has patient fallen in last 6 months?  Can occasionally lose her balance   PATIENT GOALS: I want to be able to get up better improve walking and posture   OBJECTIVE: (objective measures completed at initial evaluation unless otherwise dated)   DIAGNOSTIC FINDINGS:    06/2022: Severe lateral compartment narrowing with lateral and intercondylar  osteophytes was noted.  Severe patellofemoral narrowing was noted.  No  chondrocalcinosis was noted.   Impression: These findings are consistent with severe osteoarthritis and  severe chondromalacia patella.      PATIENT SURVEYS:  FOTO  60%   COGNITION: Overall cognitive status: Within functional limits for tasks assessed                         SENSATION: WFL  Toes numb but not sure if she has neuropathy     EDEMA:  NT   MUSCLE LENGTH: NT   POSTURE: rounded shoulders, forward head, increased thoracic kyphosis, flexed trunk , and genu valgus   PALPATION: Pain medial compartment and TTP throughout peri-patellar   LOWER EXTREMITY ROM:   Active ROM Right eval Left eval  Hip flexion      Hip extension      Hip abduction      Hip adduction      Hip internal rotation      Hip external  rotation      Knee flexion 102 108  Knee extension 8 11  Ankle dorsiflexion      Ankle plantarflexion      Ankle inversion      Ankle eversion       (Blank rows = not tested)   LOWER EXTREMITY MMT:   MMT Right eval Left eval  Hip flexion 4 4  Hip extension      Hip abduction 3- 3-  Hip adduction      Hip internal rotation      Hip external rotation      Knee flexion 4+ 4+  Knee extension 4+ 4+  Ankle dorsiflexion 5 5  Ankle plantarflexion      Ankle inversion      Ankle eversion       (Blank rows = not tested)   LOWER EXTREMITY SPECIAL TESTS:  NT    FUNCTIONAL TESTS:  5 times sit to stand: 43 sec  30 seconds chair stand test Timed up and go (TUG): NT  2 minute walk test: 288 feet    GAIT: Distance walked: 150 Assistive device utilized: None Level of assistance: Modified independence Comments: trunk flexed, genu valgus      TODAY'S TREATMENT:   [facility] Adult PT Treatment:                                                DATE: 10/25/22 Therapeutic Exercise: Nustep LE and UE for 6 min level 6 Knee AAROM heel slides x 10 each side with slider and green strap Short arc quads x 20 each LE (used black single bolster) Bridge legs on ball x 10  Hamstring curl x 10 with ball  Sidelying clam red band  x 10 decr ROM    [facility] Adult PT Treatment:                                                DATE: 10/21/22 Therapeutic Exercise: NuStep 36mn L3 UE/LE Seated hamstring stretch x2 30" each Standing gastroc/hip flexor stretches x2 30" each Supine hip/knee flex/ext x5 each Supine SLR c quad set 2x5 each Supine clam x10 c RTB STS from bari-mat table 2x5  PATIENT EDUCATION:  Education details: PT/POC/HEP , orthopedics  Person educated: Patient Education method: Explanation, Demonstration, and Handouts Education comprehension: verbalized understanding, returned  demonstration, and needs further education   HOME EXERCISE PROGRAM: Access Code: [email or web address] URL: [email or web address] Date: 10/21/2022 Prepared by: Gar Ponto  Exercises - Seated Hamstring Stretch  - 1 x daily - 7 x weekly - 1 sets - 3 reps - 30 hold - Gastroc Stretch on Wall  - 1 x daily - 7 x weekly - 1 sets - 3 reps - 30 hold - Hip Flexion  - 1 x daily - 7 x weekly - 1 sets - 5 reps - 3 hold - Standing Hip Flexor Stretch  - 1 x daily - 7 x weekly - 1 sets - 3 reps - 30 hold - Active Straight Leg Raise with Quad Set  - 1 x daily - 7 x weekly - 1 sets - 5-10 reps - 3 hold - Bridge with Hip Abduction and Resistance  - 1 x daily - 7 x weekly - 1 sets - 5-10 reps - 3 hold - Hooklying Clamshell with Resistance  - 1 x daily - 7 x weekly - 1 sets - 10 reps - 3 hold - Sit to Stand with Counter Support  - 1 x daily - 7 x weekly - 2 sets - 5 reps - Seated Long Arc Quad  - 1 x daily - 7 x weekly - 2 sets - 10 reps - 3 hold   ASSESSMENT:   CLINICAL IMPRESSION:  Patient continues to have knee pain, severe at times when transferring, bending Rt knee especially. She makes time to do her HEP as best as she can.  She will benefit from a orthopedic consult.  Discussed an off loader brace, injections and surgical options a bit as well. Focused on hip and knee strength for mat level exercises. Bridging on ball worked better than standard due to reduced knee flexion rt LE.   OBJECTIVE IMPAIRMENTS: Abnormal gait, decreased activity tolerance, decreased mobility, difficulty walking, decreased ROM, decreased strength, hypomobility, increased fascial restrictions, impaired flexibility, improper body mechanics, postural dysfunction, and pain.    ACTIVITY LIMITATIONS: carrying, lifting, bending, standing, squatting, stairs, transfers, locomotion level, and caring for others   PARTICIPATION LIMITATIONS: cleaning, laundry, shopping, and community activity   PERSONAL FACTORS: Age, Time since onset  of injury/illness/exacerbation, and 1-2 comorbidities: scleroderma, previous knee scopes  are also affecting patient's functional outcome.     GOALS: Goals reviewed with patient? Yes   SHORT TERM GOALS: Target date: 11/05/2022     Pt will be I with HEP for LE strengthening  Baseline: Status: Ind Goal status: MET   2.  Pt will note greater ease of sit to stand transfers due to improved knee mobility Baseline: Status: 10/21/22= Pt reports effort level for STS is the same Goal status: ongoing    3.  Pt will be able to increase steps per day to 4000 Baseline: 2 641 241 2783 steps  Status: 10/21/22= 4000-5000 per pt report Goal status: MET     LONG TERM GOALS: Target date: 11/19/2022     Pt will be I with final HEP  Baseline:  Goal status: INITIAL   2.  FOTO score will increase to 70% or better  Baseline: 60% Goal status: INITIAL   3.  Pt will be able to perform sit to stand x 5 (no UEs in < 30 sec)  Baseline: 43 sec with UE assist  Goal status: INITIAL  4.  Pt will be able to complete 2 min walk test to 350 feet or more Baseline: 288 feet Goal status: INITIAL   5.  Pt will increase hip strength to 4/5 to improve gait stability and support knees  Baseline: 3-/5 Goal status: INITIAL     PLAN:   PT FREQUENCY: 2x/week   PT DURATION: 8 weeks   PLANNED INTERVENTIONS: Therapeutic exercises, Therapeutic activity, Neuromuscular re-education, Balance training, Gait training, Patient/Family education, Self Care, Joint mobilization, Stair training, Aquatic Therapy, Cryotherapy, Moist heat, Taping, Manual therapy, and Re-evaluation   PLAN FOR NEXT SESSION: NuStep, hip strength as able, tolerated. progress ROM and strength .Aquatics , schedule 1 x water 1 x land per week       Raeford Razor, PT 10/25/22 3:07 PM Phone: [phone] Fax: [phone]

## 2022-10-25 NOTE — Patient Instructions (Signed)
Aquatic Therapy at [facility]-  What to Expect!  Where:   [facility] [address] [facility] [place], [place] [address] Rehab phone [phone]  NOTE:  You will receive an automated phone message reminding you of your appt and it will say the appointment is at the [address] [facility] [address] [facility] clinic.          How to Prepare: Please make sure you drink 8 ounces of water about one hour prior to your pool session A caregiver may attend if needed with the patient to help assist as needed. A caregiver can sit in the pool room on chair. Please arrive IN YOUR SUIT and 15 minutes prior to your appointment - this helps to avoid delays in starting your session. Please make sure to attend to any toileting needs prior to entering the pool Locker rooms for changing are provided.   There is direct access to the pool deck form the locker room.  You can lock your belongings in a locker with lock provided. Once on the pool deck your therapist will ask if you have signed the Patient  Consent and Assignment of Benefits form before beginning treatment Your therapist may take your blood pressure prior to, during and after your session if indicated We usually try and create a home exercise program based on activities we do in the pool.  Please be thinking about who might be able to assist you in the pool should you need to participate in an aquatic home exercise program at the time of discharge if you need assistance.  Some patients do not want to or do not have the ability to participate in an aquatic home program - this is not a barrier in any way to you participating in aquatic therapy as part of your current therapy plan! After Discharge from PT, you can continue using home program at  the [facility]/, there is a drop-in fee for $5 ($45 a month)or for 60 years  or older $4.00 ($40 a month for seniors ) or any local [employer] pool.  Memberships for purchase are  available for gym/pool at [facility]  IT IS VERY IMPORTANT THAT YOUR LAST VISIT BE IN THE CLINIC AT [address] AFTER YOUR LAST AQUATIC VISIT.  PLEASE MAKE SURE THAT YOU HAVE A [address]  APPOINTMENT SCHEDULED.   About the pool: Pool is located approximately 500 FT from the entrance of the building.  Please bring a support person if you need assistance traveling this      distance.   Your therapist will assist you in entering the water; there are two ways to           enter: stairs with railings, and a mechanical lift. Your therapist will determine the most appropriate way for you.  Water temperature is usually between 88-90 degrees  There may be up to 2 other swimmers in the pool at the same time  The pool deck is tile, please wear shoes with good traction if you prefer not to be barefoot.    Contact Info:  For appointment scheduling and cancellations:         Please call the [facility]  PH:[phone]              Aquatic Therapy  [facility]patient [facility]       All sessions are 45 minutes                                                    

## 2022-10-26 NOTE — Therapy (Signed)
OUTPATIENT PHYSICAL THERAPY TREATMENT NOTE   Patient Name: Rhonda Buchanan MRN: [id number] DOB:[birth year], 77 y.o., female Today's Date: 10/25/2022  PCP: Denita Lung, MD  REFERRING PROVIDER: Estanislado Pandy, MD  END OF SESSION:   PT End of Session - 10/25/22 1453     Visit Number 5    Number of Visits 16    Date for PT Re-Evaluation 11/19/22    Authorization Type U[employer]    PT Start Time 1418    PT Stop Time 1505    PT Time Calculation (min) 47 min    Activity Tolerance Patient tolerated treatment well;Patient limited by pain    Behavior During Therapy [facility] for tasks assessed/performed               Past Medical History:  Diagnosis Date   Allergy    Arthritis    Hypercholesterolemia    Hypertension    PUD (peptic ulcer disease)    Scleroderma ([place])    Past Surgical History:  Procedure Laterality Date   arthroscopy     CYST REMOVAL HAND     EYE SURGERY Left 10/25/2017   laser surgery    KNEE ARTHROPLASTY     Patient Active Problem List   Diagnosis Date Noted   Pain due to onychomycosis of toenails of both feet 08/24/2022   Callus 08/24/2022   PAD (peripheral artery disease) ([place]) 04/22/2022   Aortic atherosclerosis ([place]) 04/15/2021   High risk medication use 04/09/2020   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   Atherosclerosis of coronary artery of native heart without angina pectoris 10/05/2016   Aortic stenosis 05/21/2016   Abnormal PFTs 05/21/2016   Asthma due to environmental allergies 06/24/2015   Positive PPD, treated 06/05/2015   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/14/2014   Scleroderma ([place]) 07/12/2013   Hypertension 03/04/2011   Hyperlipidemia 03/04/2011   Allergic rhinitis 03/04/2011   Arthritis 03/04/2011   Migraine variant 04/18/1995    REFERRING DIAG: M17.0 (ICD-10-CM) - Primary osteoarthritis of both knees   THERAPY DIAG:  Chronic pain of left knee  Difficulty in walking, not elsewhere classified  Chronic pain of right  knee  Rationale for Evaluation and Treatment Rehabilitation  SUBJECTIVE:    SUBJECTIVE STATEMENT: Patient saw the MD Friday and was feeling good in her knees, easier getting in and out of the car.   Right now the knees are hurting 7/10 and it is worse bending to get in and out of the car.  She will be seeing an orthopedic MD and has had injections in the past from Rheumatologist.    PAIN:  Are you having pain? Yes: NPRS scale: 7/10 Pain location: bilateral knees Pain description: aching pressure  Aggravating factors: walking, stairs Relieving factors: rest, does not use ice/heat    PERTINENT HISTORY: Scleroderma, Morphea Bilateral arthroscopic knee surgery 13 yrs approx    PRECAUTIONS: None   WEIGHT BEARING RESTRICTIONS: No   FALLS:  Has patient fallen in last 6 months?  Can occasionally lose her balance   PATIENT GOALS: I want to be able to get up better improve walking and posture   OBJECTIVE: (objective measures completed at initial evaluation unless otherwise dated)   DIAGNOSTIC FINDINGS:    06/2022: Severe lateral compartment narrowing with lateral and intercondylar  osteophytes was noted.  Severe patellofemoral narrowing was noted.  No  chondrocalcinosis was noted.   Impression: These findings are consistent with severe osteoarthritis and  severe chondromalacia patella.      PATIENT SURVEYS:  FOTO  60%   COGNITION: Overall cognitive status: Within functional limits for tasks assessed                         SENSATION: WFL  Toes numb but not sure if she has neuropathy     EDEMA:  NT   MUSCLE LENGTH: NT   POSTURE: rounded shoulders, forward head, increased thoracic kyphosis, flexed trunk , and genu valgus   PALPATION: Pain medial compartment and TTP throughout peri-patellar   LOWER EXTREMITY ROM:   Active ROM Right eval Left eval  Hip flexion      Hip extension      Hip abduction      Hip adduction      Hip internal rotation      Hip external  rotation      Knee flexion 102 108  Knee extension 8 11  Ankle dorsiflexion      Ankle plantarflexion      Ankle inversion      Ankle eversion       (Blank rows = not tested)   LOWER EXTREMITY MMT:   MMT Right eval Left eval  Hip flexion 4 4  Hip extension      Hip abduction 3- 3-  Hip adduction      Hip internal rotation      Hip external rotation      Knee flexion 4+ 4+  Knee extension 4+ 4+  Ankle dorsiflexion 5 5  Ankle plantarflexion      Ankle inversion      Ankle eversion       (Blank rows = not tested)   LOWER EXTREMITY SPECIAL TESTS:  NT    FUNCTIONAL TESTS:  5 times sit to stand: 43 sec  30 seconds chair stand test Timed up and go (TUG): NT  2 minute walk test: 288 feet    GAIT: Distance walked: 150 Assistive device utilized: None Level of assistance: Modified independence Comments: trunk flexed, genu valgus      TODAY'S TREATMENT:  [facility] Adult PT Treatment:                                                DATE: 10/27/22 Therapeutic Exercise: *** Manual Therapy: *** Neuromuscular re-ed: *** Therapeutic Activity: *** Modalities: *** Self Care: ***  [facility] Adult PT Treatment:                                                DATE: 10/25/22 Therapeutic Exercise: Nustep LE and UE for 6 min level 6 Knee AAROM heel slides x 10 each side with slider and green strap Short arc quads x 20 each LE (used black single bolster) Bridge legs on ball x 10  Hamstring curl x 10 with ball  Sidelying clam red band  x 10 decr ROM    [facility] Adult PT Treatment:                                                DATE: 10/21/22 Therapeutic Exercise: NuStep 19mn L3 UE/LE Seated hamstring  stretch x2 30" each Standing gastroc/hip flexor stretches x2 30" each Supine hip/knee flex/ext x5 each Supine SLR c quad set 2x5 each Supine clam x10 c RTB STS from bari-mat table 2x5                                                                                                                          PATIENT EDUCATION:  Education details: PT/POC/HEP , orthopedics  Person educated: Patient Education method: Explanation, Demonstration, and Handouts Education comprehension: verbalized understanding, returned demonstration, and needs further education   HOME EXERCISE PROGRAM: Access Code: [email or web address] URL: [email or web address] Date: 10/21/2022 Prepared by: Gar Ponto  Exercises - Seated Hamstring Stretch  - 1 x daily - 7 x weekly - 1 sets - 3 reps - 30 hold - Gastroc Stretch on Wall  - 1 x daily - 7 x weekly - 1 sets - 3 reps - 30 hold - Hip Flexion  - 1 x daily - 7 x weekly - 1 sets - 5 reps - 3 hold - Standing Hip Flexor Stretch  - 1 x daily - 7 x weekly - 1 sets - 3 reps - 30 hold - Active Straight Leg Raise with Quad Set  - 1 x daily - 7 x weekly - 1 sets - 5-10 reps - 3 hold - Bridge with Hip Abduction and Resistance  - 1 x daily - 7 x weekly - 1 sets - 5-10 reps - 3 hold - Hooklying Clamshell with Resistance  - 1 x daily - 7 x weekly - 1 sets - 10 reps - 3 hold - Sit to Stand with Counter Support  - 1 x daily - 7 x weekly - 2 sets - 5 reps - Seated Long Arc Quad  - 1 x daily - 7 x weekly - 2 sets - 10 reps - 3 hold   ASSESSMENT:   CLINICAL IMPRESSION:   Patient continues to have knee pain, severe at times when transferring, bending Rt knee especially. She makes time to do her HEP as best as she can.  She will benefit from a orthopedic consult.  Discussed an off loader brace, injections and surgical options a bit as well. Focused on hip and knee strength for mat level exercises. Bridging on ball worked better than standard due to reduced knee flexion rt LE.   OBJECTIVE IMPAIRMENTS: Abnormal gait, decreased activity tolerance, decreased mobility, difficulty walking, decreased ROM, decreased strength, hypomobility, increased fascial restrictions, impaired flexibility, improper body mechanics, postural dysfunction, and pain.    ACTIVITY LIMITATIONS: carrying,  lifting, bending, standing, squatting, stairs, transfers, locomotion level, and caring for others   PARTICIPATION LIMITATIONS: cleaning, laundry, shopping, and community activity   PERSONAL FACTORS: Age, Time since onset of injury/illness/exacerbation, and 1-2 comorbidities: scleroderma, previous knee scopes  are also affecting patient's functional outcome.     GOALS: Goals reviewed with patient? Yes   SHORT TERM GOALS: Target date: 11/05/2022     Pt will be I with  HEP for LE strengthening  Baseline: Status: Ind Goal status: MET   2.  Pt will note greater ease of sit to stand transfers due to improved knee mobility Baseline: Status: 10/21/22= Pt reports effort level for STS is the same Goal status: ongoing    3.  Pt will be able to increase steps per day to 4000 Baseline: 2 [phone] steps  Status: 10/21/22= 4000-5000 per pt report Goal status: MET     LONG TERM GOALS: Target date: 11/19/2022     Pt will be I with final HEP  Baseline:  Goal status: INITIAL   2.  FOTO score will increase to 70% or better  Baseline: 60% Goal status: INITIAL   3.  Pt will be able to perform sit to stand x 5 (no UEs in < 30 sec)  Baseline: 43 sec with UE assist  Goal status: INITIAL   4.  Pt will be able to complete 2 min walk test to 350 feet or more Baseline: 288 feet Goal status: INITIAL   5.  Pt will increase hip strength to 4/5 to improve gait stability and support knees  Baseline: 3-/5 Goal status: INITIAL     PLAN:   PT FREQUENCY: 2x/week   PT DURATION: 8 weeks   PLANNED INTERVENTIONS: Therapeutic exercises, Therapeutic activity, Neuromuscular re-education, Balance training, Gait training, Patient/Family education, Self Care, Joint mobilization, Stair training, Aquatic Therapy, Cryotherapy, Moist heat, Taping, Manual therapy, and Re-evaluation   PLAN FOR NEXT SESSION: NuStep, hip strength as able, tolerated. progress ROM and strength .Aquatics , schedule 1 x water 1 x land per  week     [employer] MS, PT 10/26/22 8:58 PM

## 2022-10-27 ENCOUNTER — Ambulatory Visit: Payer: Medicare Other

## 2022-10-27 DIAGNOSIS — R262 Difficulty in walking, not elsewhere classified: Secondary | ICD-10-CM

## 2022-10-27 DIAGNOSIS — G8929 Other chronic pain: Secondary | ICD-10-CM

## 2022-10-27 DIAGNOSIS — M25561 Pain in right knee: Secondary | ICD-10-CM | POA: Diagnosis not present

## 2022-10-27 DIAGNOSIS — M25562 Pain in left knee: Secondary | ICD-10-CM | POA: Diagnosis not present

## 2022-11-03 ENCOUNTER — Ambulatory Visit: Payer: Medicare Other | Admitting: Physical Therapy

## 2022-11-03 ENCOUNTER — Encounter: Payer: Self-pay | Admitting: Physical Therapy

## 2022-11-03 DIAGNOSIS — M25561 Pain in right knee: Secondary | ICD-10-CM | POA: Diagnosis not present

## 2022-11-03 DIAGNOSIS — G8929 Other chronic pain: Secondary | ICD-10-CM

## 2022-11-03 DIAGNOSIS — M25562 Pain in left knee: Secondary | ICD-10-CM | POA: Diagnosis not present

## 2022-11-03 DIAGNOSIS — R262 Difficulty in walking, not elsewhere classified: Secondary | ICD-10-CM | POA: Diagnosis not present

## 2022-11-03 NOTE — Therapy (Signed)
OUTPATIENT PHYSICAL THERAPY TREATMENT NOTE   Patient Name: Rhonda Buchanan MRN: [id number] DOB:[birth year], 77 y.o., female Today's Date: 11/03/2022  PCP: Denita Lung, MD  REFERRING PROVIDER: Estanislado Pandy, MD  END OF SESSION:   PT End of Session - 11/03/22 1452     Visit Number 7    Number of Visits 16    Date for PT Re-Evaluation 11/19/22    Authorization Type UHC    PT Start Time 1450    PT Stop Time 4259    PT Time Calculation (min) 38 min               Past Medical History:  Diagnosis Date   Allergy    Arthritis    Hypercholesterolemia    Hypertension    PUD (peptic ulcer disease)    Scleroderma ([place])    Past Surgical History:  Procedure Laterality Date   arthroscopy     CYST REMOVAL HAND     EYE SURGERY Left 10/25/2017   laser surgery    KNEE ARTHROPLASTY     Patient Active Problem List   Diagnosis Date Noted   Pain due to onychomycosis of toenails of both feet 08/24/2022   Callus 08/24/2022   PAD (peripheral artery disease) ([place]) 04/22/2022   Aortic atherosclerosis ([place]) 04/15/2021   High risk medication use 04/09/2020   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   Atherosclerosis of coronary artery of native heart without angina pectoris 10/05/2016   Aortic stenosis 05/21/2016   Abnormal PFTs 05/21/2016   Asthma due to environmental allergies 06/24/2015   Positive PPD, treated 06/05/2015   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/14/2014   Scleroderma ([place]) 07/12/2013   Hypertension 03/04/2011   Hyperlipidemia 03/04/2011   Allergic rhinitis 03/04/2011   Arthritis 03/04/2011   Migraine variant 04/18/1995    REFERRING DIAG: M17.0 (ICD-10-CM) - Primary osteoarthritis of both knees   THERAPY DIAG:  Chronic pain of left knee  Difficulty in walking, not elsewhere classified  Chronic pain of right knee  Rationale for Evaluation and Treatment Rehabilitation  SUBJECTIVE:    SUBJECTIVE STATEMENT: Pt reports 6/10 right knee pain on arrival.     PAIN:  Are you having pain? Yes: NPRS scale: 6/10 Pain location: right knee Pain description: aching pressure  Aggravating factors: walking, stairs Relieving factors: rest, does not use ice/heat    PERTINENT HISTORY: Scleroderma, Morphea Bilateral arthroscopic knee surgery 13 yrs approx    PRECAUTIONS: None   WEIGHT BEARING RESTRICTIONS: No   FALLS:  Has patient fallen in last 6 months?  Can occasionally lose her balance   PATIENT GOALS: I want to be able to get up better improve walking and posture   OBJECTIVE: (objective measures completed at initial evaluation unless otherwise dated)   DIAGNOSTIC FINDINGS:    06/2022: Severe lateral compartment narrowing with lateral and intercondylar  osteophytes was noted.  Severe patellofemoral narrowing was noted.  No  chondrocalcinosis was noted.   Impression: These findings are consistent with severe osteoarthritis and  severe chondromalacia patella.      PATIENT SURVEYS:  FOTO 60% 11/03/22 FOTO 57%   COGNITION: Overall cognitive status: Within functional limits for tasks assessed                         SENSATION: WFL  Toes numb but not sure if she has neuropathy     EDEMA:  NT   MUSCLE LENGTH: NT   POSTURE: rounded shoulders, forward head, increased thoracic  kyphosis, flexed trunk , and genu valgus   PALPATION: Pain medial compartment and TTP throughout peri-patellar   LOWER EXTREMITY ROM:   Active ROM Right eval Left eval 11/03/22 Right 11/03/22 Left  Hip flexion        Hip extension        Hip abduction        Hip adduction        Hip internal rotation        Hip external rotation        Knee flexion 102 108 92 P! 110  Knee extension 8 11    Ankle dorsiflexion        Ankle plantarflexion        Ankle inversion        Ankle eversion         (Blank rows = not tested)   LOWER EXTREMITY MMT:   MMT Right eval Left eval  Hip flexion 4 4  Hip extension      Hip abduction 3- 3-  Hip adduction       Hip internal rotation      Hip external rotation      Knee flexion 4+ 4+  Knee extension 4+ 4+  Ankle dorsiflexion 5 5  Ankle plantarflexion      Ankle inversion      Ankle eversion       (Blank rows = not tested)   LOWER EXTREMITY SPECIAL TESTS:  NT    FUNCTIONAL TESTS:  5 times sit to stand: 43 sec : 11/03/22: 30 sec  30 seconds chair stand test Timed up and go (TUG): NT  2 minute walk test: 288 feet    GAIT: Distance walked: 150 Assistive device utilized: None Level of assistance: Modified independence Comments: trunk flexed, genu valgus      TODAY'S TREATMENT:  [facility] Adult PT Treatment:                                                DATE: 11/03/22 Therapeutic Exercise: Nustep L3 x 5 minutes  Hamstring stretch 2 x 30 sec  LAQ x 25 each  Gastroc stretch at wall 2 x 20 sec SLR with QS x10 each  Heel slide AAROM.   Therapeutic Activity: 5 x STS FOTO 57 Squat at sink- -1/2 ROM to chair x 6 - too painful     [facility] Adult PT Treatment:                                                DATE: 10/27/22 Therapeutic Exercise: NuStep 82mn L3 UE/LE Knee AAROM heel slides x 10 each side with slider and green strap Hamstring curl x 10 with ball  Bridge legs on swiss ball 2x 10 SLR c quad set 10 each  Short arc quads 2x10 each LE (used black single bolster) Sidelying clam red band  x 10 decr ROM  Seated hamstring stretch x2 30" each Manual Therapy: Kinesiotaping to the R knee for support and pain. 3 l strips, 1 horizonal across the patella tendon 50%, then 1 medial and 1 lateral from the patella strip vertically around the patella 25%, to the distal quad. All with tag ends without tension.  [facility]                                                DATE: 10/25/22 Therapeutic Exercise: Nustep LE and UE for 6 min level 6 Knee AAROM heel slides x 10 each side with slider and green strap Short arc quads x 20 each LE (used black single bolster) Bridge legs on ball x 10   Hamstring curl x 10 with ball  Sidelying clam red band  x 10 decr ROM    [facility] Adult PT Treatment:                                                DATE: 10/21/22 Therapeutic Exercise: NuStep 68mn L3 UE/LE Seated hamstring stretch x2 30" each Standing gastroc/hip flexor stretches x2 30" each Supine hip/knee flex/ext x5 each Supine SLR c quad set 2x5 each Supine clam x10 c RTB STS from bari-mat table 2x5                                                                                                                         PATIENT EDUCATION:  Education details: PT/POC/HEP , orthopedics  Person educated: Patient Education method: Explanation, Demonstration, and Handouts Education comprehension: verbalized understanding, returned demonstration, and needs further education   HOME EXERCISE PROGRAM: Access Code: [email or web address]: [email or web address] Date: 10/21/2022 Prepared by: AGar Ponto Exercises - Seated Hamstring Stretch  - 1 x daily - 7 x weekly - 1 sets - 3 reps - 30 hold - Gastroc Stretch on Wall  - 1 x daily - 7 x weekly - 1 sets - 3 reps - 30 hold - Hip Flexion  - 1 x daily - 7 x weekly - 1 sets - 5 reps - 3 hold - Standing Hip Flexor Stretch  - 1 x daily - 7 x weekly - 1 sets - 3 reps - 30 hold - Active Straight Leg Raise with Quad Set  - 1 x daily - 7 x weekly - 1 sets - 5-10 reps - 3 hold - Bridge with Hip Abduction and Resistance  - 1 x daily - 7 x weekly - 1 sets - 5-10 reps - 3 hold - Hooklying Clamshell with Resistance  - 1 x daily - 7 x weekly - 1 sets - 10 reps - 3 hold - Sit to Stand with Counter Support  - 1 x daily - 7 x weekly - 2 sets - 5 reps - Seated Long Arc Quad  - 1 x daily - 7 x weekly - 2 sets - 10 reps - 3 hold   ASSESSMENT:   CLINICAL IMPRESSION: Pt reports some improvement in pain with KT tape. [facility]  [facility]. Her 5 x STS has improved. Continued with knee Rom and quad activation. Reviewed Newest additions to HEP with  counter squats causing increased pain so discontinued. She has not been performing these at home. Pt tolerated PT today without adverse effects. Pt will continue to benefit from skilled PT to address impairments for improved function. She will try aquatics next week.   OBJECTIVE IMPAIRMENTS: Abnormal gait, decreased activity tolerance, decreased mobility, difficulty walking, decreased ROM, decreased strength, hypomobility, increased fascial restrictions, impaired flexibility, improper body mechanics, postural dysfunction, and pain.    ACTIVITY LIMITATIONS: carrying, lifting, bending, standing, squatting, stairs, transfers, locomotion level, and caring for others   PARTICIPATION LIMITATIONS: cleaning, laundry, shopping, and community activity   PERSONAL FACTORS: Age, Time since onset of injury/illness/exacerbation, and 1-2 comorbidities: scleroderma, previous knee scopes  are also affecting patient's functional outcome.     GOALS: Goals reviewed with patient? Yes   SHORT TERM GOALS: Target date: 11/05/2022     Pt will be I with HEP for LE strengthening  Baseline: Status: Ind Goal status: MET   2.  Pt will note greater ease of sit to stand transfers due to improved knee mobility Baseline: Status: 10/21/22= Pt reports effort level for STS is the same Goal status: ongoing    3.  Pt will be able to increase steps per day to 4000 Baseline: 2 [phone] steps  Status: 10/21/22= 4000-5000 per pt report Goal status: MET     LONG TERM GOALS: Target date: 11/19/2022     Pt will be I with final HEP  Baseline:  Goal status: INITIAL   2.  FOTO score will increase to 70% or better  Baseline: 60% Goal status: INITIAL   3.  Pt will be able to perform sit to stand x 5 (no UEs in < 30 sec)  Baseline: 43 sec with UE assist  Goal status: INITIAL   4.  Pt will be able to complete 2 min walk test to 350 feet or more Baseline: 288 feet Goal status: INITIAL   5.  Pt will increase hip strength to 4/5  to improve gait stability and support knees  Baseline: 3-/5 Goal status: INITIAL     PLAN:   PT FREQUENCY: 2x/week   PT DURATION: 8 weeks   PLANNED INTERVENTIONS: Therapeutic exercises, Therapeutic activity, Neuromuscular re-education, Balance training, Gait training, Patient/Family education, Self Care, Joint mobilization, Stair training, Aquatic Therapy, Cryotherapy, Moist heat, Taping, Manual therapy, and Re-evaluation   PLAN FOR NEXT SESSION: NuStep, hip strength as able, tolerated. progress ROM and strength .Aquatics , schedule 1 x water 1 x land per week .     Hessie Diener, PTA 11/03/22 3:30 PM Phone: [phone] Fax: [phone]

## 2022-11-04 NOTE — Therapy (Incomplete)
OUTPATIENT PHYSICAL THERAPY TREATMENT NOTE   Patient Name: Rhonda Buchanan MRN: [id number] DOB:Apr 11, 1946, 77 y.o., female Today's Date: 11/04/2022  PCP: Denita Lung, MD  REFERRING PROVIDER: Estanislado Pandy, MD  END OF SESSION:       Past Medical History:  Diagnosis Date   Allergy    Arthritis    Hypercholesterolemia    Hypertension    PUD (peptic ulcer disease)    Scleroderma ([place])    Past Surgical History:  Procedure Laterality Date   arthroscopy     CYST REMOVAL HAND     EYE SURGERY Left 10/25/2017   laser surgery    KNEE ARTHROPLASTY     Patient Active Problem List   Diagnosis Date Noted   Pain due to onychomycosis of toenails of both feet 08/24/2022   Callus 08/24/2022   PAD (peripheral artery disease) ([place]) 04/22/2022   Aortic atherosclerosis ([place]) 04/15/2021   High risk medication use 04/09/2020   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  Rationale for Evaluation and Treatment Rehabilitation  SUBJECTIVE:    SUBJECTIVE STATEMENT: Pt reports 6/10 right knee pain on arrival.    PAIN:  Are you having pain? Yes: NPRS scale: 6/10 Pain location: right knee Pain description: aching pressure  Aggravating factors: walking, stairs Relieving factors: rest, does not use ice/heat    PERTINENT HISTORY: Scleroderma, Morphea Bilateral arthroscopic knee surgery 13 yrs approx    PRECAUTIONS: None    WEIGHT BEARING RESTRICTIONS: No   FALLS:  Has patient fallen in last 6 months?  Can occasionally lose her balance   PATIENT GOALS: I want to be able to get up better improve walking and posture   OBJECTIVE: (objective measures completed at initial evaluation unless otherwise dated)   DIAGNOSTIC FINDINGS:    06/2022: Severe lateral compartment narrowing with lateral and intercondylar  osteophytes was noted.  Severe patellofemoral narrowing was noted.  No  chondrocalcinosis was noted.   Impression: These findings are consistent with severe osteoarthritis and  severe chondromalacia patella.      PATIENT SURVEYS:  FOTO 60% 11/03/22 FOTO 57%   COGNITION: Overall cognitive status: Within functional limits for tasks assessed                         SENSATION: WFL  Toes numb but not sure if she has neuropathy     EDEMA:  NT   MUSCLE LENGTH: NT   POSTURE: rounded shoulders, forward head, increased thoracic kyphosis, flexed trunk , and genu valgus   PALPATION: Pain medial compartment and TTP throughout peri-patellar   LOWER EXTREMITY ROM:   Active ROM Right eval Left eval 11/03/22 Right 11/03/22 Left  Hip flexion        Hip extension        Hip abduction        Hip adduction        Hip internal rotation        Hip external  rotation        Knee flexion 102 108 92 P! 110  Knee extension 8 11    Ankle dorsiflexion        Ankle plantarflexion        Ankle inversion        Ankle eversion         (Blank rows = not tested)   LOWER EXTREMITY MMT:   MMT Right eval Left eval  Hip flexion 4 4  Hip extension      Hip abduction 3- 3-  Hip adduction      Hip internal rotation      Hip external rotation      Knee flexion 4+ 4+  Knee extension 4+ 4+  Ankle dorsiflexion 5 5  Ankle plantarflexion      Ankle inversion      Ankle eversion       (Blank rows = not tested)   LOWER EXTREMITY SPECIAL TESTS:  NT    FUNCTIONAL TESTS:  5 times sit to stand: 43 sec :  11/03/22: 30 sec  30 seconds chair stand test Timed up and go (TUG): NT  2 minute walk test: 288 feet    GAIT: Distance walked: 150 Assistive device utilized: None Level of assistance: Modified independence Comments: trunk flexed, genu valgus      TODAY'S TREATMENT:  [facility] Adult PT Treatment:                                                DATE: 11/05/22 Therapeutic Exercise: *** Manual Therapy: *** Neuromuscular re-ed: *** Therapeutic Activity: *** Modalities: *** Self Care: ***  [facility] Adult PT Treatment:                                                DATE: 11/03/22 Therapeutic Exercise: Nustep L3 x 5 minutes  Hamstring stretch 2 x 30 sec  LAQ x 25 each  Gastroc stretch at wall 2 x 20 sec SLR with QS x10 each  Heel slide AAROM.   Therapeutic Activity: 5 x STS FOTO 57 Squat at sink- -1/2 ROM to chair x 6 - too painful     [facility] Adult PT Treatment:                                                DATE: 10/27/22 Therapeutic Exercise: NuStep 56mn L3 UE/LE Knee AAROM heel slides x 10 each side with slider and green strap Hamstring curl x 10 with ball  Bridge legs on swiss ball 2x 10 SLR c quad set 10 each  Short arc quads 2x10 each LE (used black single bolster) Sidelying clam red band  x 10 decr ROM  Seated hamstring stretch x2 30" each Manual Therapy: Kinesiotaping to the R knee for support and pain. 3 l strips, 1 horizonal across the patella tendon 50%, then 1 medial and 1 lateral from the patella strip vertically around the patella 25%, to the distal quad. All with tag ends without tension.  [facility]  [facility]: [facility] Therapeutic Exercise: Nustep LE and UE for 6 min level 6 Knee AAROM heel slides x 10 each side with slider and green strap Short arc quads x 20 each LE (used black single bolster) Bridge legs on ball x 10  Hamstring curl x 10 with ball  Sidelying clam red band  x 10 decr ROM    [facility] Adult  PT Treatment:                                                DATE: 10/21/22 Therapeutic Exercise: NuStep 54mn L3 UE/LE Seated hamstring stretch x2 30" each Standing gastroc/hip flexor stretches x2 30" each Supine hip/knee flex/ext x5 each Supine SLR c quad set 2x5 each Supine clam x10 c RTB STS from bari-mat table 2x5                                                                                                                         PATIENT EDUCATION:  Education details: PT/POC/HEP , orthopedics  Person educated: Patient Education method: Explanation, Demonstration, and Handouts Education comprehension: verbalized understanding, returned demonstration, and needs further education   HOME EXERCISE PROGRAM: Access Code: [email or web address]: [email or web address] Date: 10/21/2022 Prepared by: AGar Ponto Exercises - Seated Hamstring Stretch  - 1 x daily - 7 x weekly - 1 sets - 3 reps - 30 hold - Gastroc Stretch on Wall  - 1 x daily - 7 x weekly - 1 sets - 3 reps - 30 hold - Hip Flexion  - 1 x daily - 7 x weekly - 1 sets - 5 reps - 3 hold - Standing Hip Flexor Stretch  - 1 x daily - 7 x weekly - 1 sets - 3 reps - 30 hold - Active Straight Leg Raise with Quad Set  - 1 x daily - 7 x weekly - 1 sets - 5-10 reps - 3 hold - Bridge with Hip Abduction and Resistance  - 1 x daily - 7 x weekly - 1 sets - 5-10 reps - 3 hold - Hooklying Clamshell with Resistance  - 1 x daily - 7 x weekly - 1 sets - 10 reps - 3 hold - Sit to Stand with Counter Support  - 1 x daily - 7 x weekly - 2 sets - 5 reps - Seated Long Arc Quad  - 1 x daily - 7 x weekly - 2 sets - 10 reps - 3 hold   ASSESSMENT:   CLINICAL IMPRESSION: Pt reports some improvement in pain with KT tape. Today, captured FOTO which decreased. Her 5 x STS has improved. Continued with knee Rom and quad activation. Reviewed Newest additions to HEP with counter squats causing increased pain so discontinued. She has not been performing these  at home. Pt tolerated PT today without adverse effects. Pt will continue to benefit  from skilled PT to address impairments for improved function. She will try aquatics next week.   OBJECTIVE IMPAIRMENTS: Abnormal gait, decreased activity tolerance, decreased mobility, difficulty walking, decreased ROM, decreased strength, hypomobility, increased fascial restrictions, impaired flexibility, improper body mechanics, postural dysfunction, and pain.    ACTIVITY LIMITATIONS: carrying, lifting, bending, standing, squatting, stairs, transfers, locomotion level, and caring for others   PARTICIPATION LIMITATIONS: cleaning, laundry, shopping, and community activity   PERSONAL FACTORS: Age, Time since onset of injury/illness/exacerbation, and 1-2 comorbidities: scleroderma, previous knee scopes  are also affecting patient's functional outcome.     GOALS: Goals reviewed with patient? Yes   SHORT TERM GOALS: Target date: 11/05/2022     Pt will be I with HEP for LE strengthening  Baseline: Status: Ind Goal status: MET   2.  Pt will note greater ease of sit to stand transfers due to improved knee mobility Baseline: Status: 10/21/22= Pt reports effort level for STS is the same Goal status: ongoing    3.  Pt will be able to increase steps per day to 4000 Baseline: 2 [phone] steps  Status: 10/21/22= 4000-5000 per pt report Goal status: MET     LONG TERM GOALS: Target date: 11/19/2022     Pt will be I with final HEP  Baseline:  Goal status: INITIAL   2.  FOTO score will increase to 70% or better  Baseline: 60% Goal status: INITIAL   3.  Pt will be able to perform sit to stand x 5 (no UEs in < 30 sec)  Baseline: 43 sec with UE assist  Goal status: INITIAL   4.  Pt will be able to complete 2 min walk test to 350 feet or more Baseline: 288 feet Goal status: INITIAL   5.  Pt will increase hip strength to 4/5 to improve gait stability and support knees  Baseline: 3-/5 Goal status: INITIAL      PLAN:   PT FREQUENCY: 2x/week   PT DURATION: 8 weeks   PLANNED INTERVENTIONS: Therapeutic exercises, Therapeutic activity, Neuromuscular re-education, Balance training, Gait training, Patient/Family education, Self Care, Joint mobilization, Stair training, Aquatic Therapy, Cryotherapy, Moist heat, Taping, Manual therapy, and Re-evaluation   PLAN FOR NEXT SESSION: NuStep, hip strength as able, tolerated. progress ROM and strength .Aquatics , schedule 1 x water 1 x land per week .     Hessie Diener, PTA 11/04/22 9:59 PM Phone: [phone] Fax: [phone]

## 2022-11-05 ENCOUNTER — Ambulatory Visit: Payer: Medicare Other

## 2022-11-05 ENCOUNTER — Ambulatory Visit: Payer: Self-pay

## 2022-11-05 DIAGNOSIS — G8929 Other chronic pain: Secondary | ICD-10-CM | POA: Diagnosis not present

## 2022-11-05 DIAGNOSIS — R262 Difficulty in walking, not elsewhere classified: Secondary | ICD-10-CM

## 2022-11-05 DIAGNOSIS — M25561 Pain in right knee: Secondary | ICD-10-CM | POA: Diagnosis not present

## 2022-11-05 DIAGNOSIS — M25562 Pain in left knee: Secondary | ICD-10-CM | POA: Diagnosis not present

## 2022-11-05 NOTE — Therapy (Signed)
OUTPATIENT PHYSICAL THERAPY TREATMENT NOTE   Patient Name: Rhonda Buchanan MRN: [id number] DOB:1946-01-12, 77 y.o., female Today's Date: 11/05/2022  PCP: Denita Lung, MD  REFERRING PROVIDER: Estanislado Pandy, MD  END OF SESSION:   PT End of Session - 11/05/22 1422     Visit Number 8    Number of Visits 16    Date for PT Re-Evaluation 11/19/22    Authorization Type [employer]    PT Start Time 3825    PT Stop Time 1505    PT Time Calculation (min) 45 min    Activity Tolerance Patient tolerated treatment well;Patient limited by pain    Behavior During Therapy [facility] for tasks assessed/performed                Past Medical History:  Diagnosis Date   Allergy    Arthritis    Hypercholesterolemia    Hypertension    PUD (peptic ulcer disease)    Scleroderma ([place])    Past Surgical History:  Procedure Laterality Date   arthroscopy     CYST REMOVAL HAND     EYE SURGERY Left 10/25/2017   laser surgery    KNEE ARTHROPLASTY     Patient Active Problem List   Diagnosis Date Noted   Pain due to onychomycosis of toenails of both feet 08/24/2022   Callus 08/24/2022   PAD (peripheral artery disease) ([place]) 04/22/2022   Aortic atherosclerosis ([place]) 04/15/2021   High risk medication use 04/09/2020   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   Atherosclerosis of coronary artery of native heart without angina pectoris 10/05/2016   Aortic stenosis 05/21/2016   Abnormal PFTs 05/21/2016   Asthma due to environmental allergies 06/24/2015   Positive PPD, treated 06/05/2015   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/14/2014   Scleroderma ([place]) 07/12/2013   Hypertension 03/04/2011   Hyperlipidemia 03/04/2011   Allergic rhinitis 03/04/2011   Arthritis 03/04/2011   Migraine variant 04/18/1995    REFERRING DIAG: M17.0 (ICD-10-CM) - Primary osteoarthritis of both knees   THERAPY DIAG:  Chronic pain of left knee  Difficulty in walking, not elsewhere classified  Chronic pain of right  knee  Rationale for Evaluation and Treatment Rehabilitation  SUBJECTIVE:    SUBJECTIVE STATEMENT:    PAIN:  Are you having pain? Yes: NPRS scale: 5/10 Pain location: right knee Pain description: aching pressure  Aggravating factors: walking, stairs Relieving factors: rest, does not use ice/heat    PERTINENT HISTORY: Scleroderma, Morphea Bilateral arthroscopic knee surgery 13 yrs approx    PRECAUTIONS: None   WEIGHT BEARING RESTRICTIONS: No   FALLS:  Has patient fallen in last 6 months?  Can occasionally lose her balance   PATIENT GOALS: I want to be able to get up better improve walking and posture   OBJECTIVE: (objective measures completed at initial evaluation unless otherwise dated)   DIAGNOSTIC FINDINGS:    06/2022: Severe lateral compartment narrowing with lateral and intercondylar  osteophytes was noted.  Severe patellofemoral narrowing was noted.  No  chondrocalcinosis was noted.   Impression: These findings are consistent with severe osteoarthritis and  severe chondromalacia patella.      PATIENT SURVEYS:  FOTO 60% 11/03/22 FOTO 57%   COGNITION: Overall cognitive status: Within functional limits for tasks assessed                         SENSATION: WFL  Toes numb but not sure if she has neuropathy     EDEMA:  NT   MUSCLE LENGTH: NT   POSTURE: rounded shoulders, forward head, increased thoracic kyphosis, flexed trunk , and genu valgus   PALPATION: Pain medial compartment and TTP throughout peri-patellar   LOWER EXTREMITY ROM:   Active ROM Right eval Left eval 11/03/22 Right 11/03/22 Left  Hip flexion        Hip extension        Hip abduction        Hip adduction        Hip internal rotation        Hip external rotation        Knee flexion 102 108 92 P! 110  Knee extension 8 11    Ankle dorsiflexion        Ankle plantarflexion        Ankle inversion        Ankle eversion         (Blank rows = not tested)   LOWER EXTREMITY MMT:    MMT Right eval Left eval  Hip flexion 4 4  Hip extension      Hip abduction 3- 3-  Hip adduction      Hip internal rotation      Hip external rotation      Knee flexion 4+ 4+  Knee extension 4+ 4+  Ankle dorsiflexion 5 5  Ankle plantarflexion      Ankle inversion      Ankle eversion       (Blank rows = not tested)   LOWER EXTREMITY SPECIAL TESTS:  NT    FUNCTIONAL TESTS:  5 times sit to stand: 43 sec : 11/03/22: 30 sec  30 seconds chair stand test Timed up and go (TUG): NT  2 minute walk test: 288 feet    GAIT: Distance walked: 150 Assistive device utilized: None Level of assistance: Modified independence Comments: trunk flexed, genu valgus      TODAY'S TREATMENT:  [facility] Adult PT Treatment:                                                DATE: 11/05/2022 Aquatic therapy at [address] - therapeutic pool temp 92 degrees Pt enters building ambulating independently with slowed gait pattern and valgus at knees.  Treatment took place in water 3.8 to  4 ft 8 in.feet deep depending upon activity.  Pt entered and exited the pool via stair and handrails with step to pattern and slow cadence. Patient entered water for aquatic therapy for first time and was introduced to principles and therapeutic effects of water as they ambulated and acclimated to pool.  Therapeutic Exercise: Walking forward/ holding white barbell/side stepping At edge of pool, pt performed LE exercise: Hip abd/add x20 BIL Hip ext/flex with knee straight x 20 BIL Hamstring curl x20 BIL Squats 2x20 Step ups on submerged step x10 Rt leading Heel raises x20  Pt requires the buoyancy of water for active assisted exercises with buoyancy supported for strengthening and AROM exercises. Hydrostatic pressure also supports joints by unweighting joint load by at least 50 % in 3-4 feet depth water. 80% in chest to neck deep water. Water will provide assistance with movement using the current and laminar  flow while the buoyancy reduces weight bearing. Pt requires the viscosity of the water for resistance with strengthening exercises.    [facility] Adult PT  Treatment:                                                DATE: 11/03/22 Therapeutic Exercise: Nustep L3 x 5 minutes  Hamstring stretch 2 x 30 sec  LAQ x 25 each  Gastroc stretch at wall 2 x 20 sec SLR with QS x10 each  Heel slide AAROM.   Therapeutic Activity: 5 x STS FOTO 57 Squat at sink- -1/2 ROM to chair x 6 - too painful     [facility] Adult PT Treatment:                                                DATE: 10/27/22 Therapeutic Exercise: NuStep 5mn L3 UE/LE Knee AAROM heel slides x 10 each side with slider and green strap Hamstring curl x 10 with ball  Bridge legs on swiss ball 2x 10 SLR c quad set 10 each  Short arc quads 2x10 each LE (used black single bolster) Sidelying clam red band  x 10 decr ROM  Seated hamstring stretch x2 30" each Manual Therapy: Kinesiotaping to the R knee for support and pain. 3 l strips, 1 horizonal across the patella tendon 50%, then 1 medial and 1 lateral from the patella strip vertically around the patella 25%, to the distal quad. All with tag ends without tension.                                                                                                                          PATIENT EDUCATION:  Education details: PT/POC/HEP , orthopedics  Person educated: Patient Education method: Explanation, Demonstration, and Handouts Education comprehension: verbalized understanding, returned demonstration, and needs further education   HOME EXERCISE PROGRAM: Access Code: [email or web address]: [email or web address] Date: 10/21/2022 Prepared by: AGar Ponto Exercises - Seated Hamstring Stretch  - 1 x daily - 7 x weekly - 1 sets - 3 reps - 30 hold - Gastroc Stretch on Wall  - 1 x daily - 7 x weekly - 1 sets - 3 reps - 30 hold - Hip Flexion  - 1 x daily - 7 x weekly - 1 sets - 5 reps - 3  hold - Standing Hip Flexor Stretch  - 1 x daily - 7 x weekly - 1 sets - 3 reps - 30 hold - Active Straight Leg Raise with Quad Set  - 1 x daily - 7 x weekly - 1 sets - 5-10 reps - 3 hold - Bridge with Hip Abduction and Resistance  - 1 x daily - 7 x weekly - 1 sets - 5-10 reps - 3 hold - Hooklying Clamshell with Resistance  - 1 x daily - 7 x weekly -  1 sets - 10 reps - 3 hold - Sit to Stand with Counter Support  - 1 x daily - 7 x weekly - 2 sets - 5 reps - Seated Long Arc Quad  - 1 x daily - 7 x weekly - 2 sets - 10 reps - 3 hold   ASSESSMENT:   CLINICAL IMPRESSION: Patient presents to first aquatic PT session reporting 5/10 pain in her Rt knee. She is initially apprehensive of the water, moving with very slow and guarded movements, becoming minimally comfortable by the end of the session. Session today focused on BIL LE strengthening in the aquatic environment for use of buoyancy to offload joints and the viscosity of water as resistance during therapeutic exercise.Patient was able to tolerate all prescribed exercises in the aquatic environment with no adverse effects. Patient continues to benefit from skilled PT services on land and aquatic based and should be progressed as able to improve functional independence.   OBJECTIVE IMPAIRMENTS: Abnormal gait, decreased activity tolerance, decreased mobility, difficulty walking, decreased ROM, decreased strength, hypomobility, increased fascial restrictions, impaired flexibility, improper body mechanics, postural dysfunction, and pain.    ACTIVITY LIMITATIONS: carrying, lifting, bending, standing, squatting, stairs, transfers, locomotion level, and caring for others   PARTICIPATION LIMITATIONS: cleaning, laundry, shopping, and community activity   PERSONAL FACTORS: Age, Time since onset of injury/illness/exacerbation, and 1-2 comorbidities: scleroderma, previous knee scopes  are also affecting patient's functional outcome.     GOALS: Goals reviewed  with patient? Yes   SHORT TERM GOALS: Target date: 11/05/2022     Pt will be I with HEP for LE strengthening  Baseline: Status: Ind Goal status: MET   2.  Pt will note greater ease of sit to stand transfers due to improved knee mobility Baseline: Status: 10/21/22= Pt reports effort level for STS is the same Goal status: ongoing    3.  Pt will be able to increase steps per day to 4000 Baseline: 2 [phone] steps  Status: 10/21/22= 4000-5000 per pt report Goal status: MET     LONG TERM GOALS: Target date: 11/19/2022     Pt will be I with final HEP  Baseline:  Goal status: INITIAL   2.  FOTO score will increase to 70% or better  Baseline: 60% Goal status: INITIAL   3.  Pt will be able to perform sit to stand x 5 (no UEs in < 30 sec)  Baseline: 43 sec with UE assist  Goal status: INITIAL   4.  Pt will be able to complete 2 min walk test to 350 feet or more Baseline: 288 feet Goal status: INITIAL   5.  Pt will increase hip strength to 4/5 to improve gait stability and support knees  Baseline: 3-/5 Goal status: INITIAL     PLAN:   PT FREQUENCY: 2x/week   PT DURATION: 8 weeks   PLANNED INTERVENTIONS: Therapeutic exercises, Therapeutic activity, Neuromuscular re-education, Balance training, Gait training, Patient/Family education, Self Care, Joint mobilization, Stair training, Aquatic Therapy, Cryotherapy, Moist heat, Taping, Manual therapy, and Re-evaluation   PLAN FOR NEXT SESSION: NuStep, hip strength as able, tolerated. progress ROM and strength .Aquatics , schedule 1 x water 1 x land per week .     Margarette Canada, PTA 11/05/22 3:12 PM

## 2022-11-07 NOTE — Therapy (Addendum)
OUTPATIENT PHYSICAL THERAPY TREATMENT NOTE   Patient Name: Rhonda Buchanan MRN: [id number] DOB:[birth year], 77 y.o., female Today's Date: 11/08/2022  PCP: Denita Lung, MD  REFERRING PROVIDER: Estanislado Pandy, MD    Progress Note Reporting Period 09/25/23 to 11/07/22  See note below for Objective Data and Assessment of Progress/Goals.     END OF SESSION:   PT End of Session - 11/08/22 1427     Visit Number 9    Number of Visits 16    Date for PT Re-Evaluation 11/19/22    Authorization Type [employer]    PT Start Time 4098    PT Stop Time 1500    PT Time Calculation (min) 40 min    Activity Tolerance Patient tolerated treatment well;Patient limited by pain    Behavior During Therapy WFL for tasks assessed/performed                 Past Medical History:  Diagnosis Date   Allergy    Arthritis    Hypercholesterolemia    Hypertension    PUD (peptic ulcer disease)    Scleroderma ([place])    Past Surgical History:  Procedure Laterality Date   arthroscopy     CYST REMOVAL HAND     EYE SURGERY Left 10/25/2017   laser surgery    KNEE ARTHROPLASTY     Patient Active Problem List   Diagnosis Date Noted   Pain due to onychomycosis of toenails of both feet 08/24/2022   Callus 08/24/2022   PAD (peripheral artery disease) ([place]) 04/22/2022   Aortic atherosclerosis ([place]) 04/15/2021   High risk medication use 04/09/2020   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   Atherosclerosis of coronary artery of native heart without angina pectoris 10/05/2016   Aortic stenosis 05/21/2016   Abnormal PFTs 05/21/2016   Asthma due to environmental allergies 06/24/2015   Positive PPD, treated 06/05/2015   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/14/2014   Scleroderma ([place]) 07/12/2013   Hypertension 03/04/2011   Hyperlipidemia 03/04/2011   Allergic rhinitis 03/04/2011   Arthritis 03/04/2011   Migraine variant 04/18/1995    REFERRING DIAG: M17.0 (ICD-10-CM) - Primary osteoarthritis of both  knees   THERAPY DIAG:  Chronic pain of left knee  Difficulty in walking, not elsewhere classified  Chronic pain of right knee  Rationale for Evaluation and Treatment Rehabilitation  SUBJECTIVE:    SUBJECTIVE STATEMENT: Its a 5/10 today , the L one is starting to hurt more.  I can feel the knocking in my Rt knee, its no longer a crunching.  Patient  also describes a heel pain that she gets in the AM.  So painful she can barely take steps.  She has not really addressed this with her providers.  This has been going on for months.     PAIN:  Are you having pain? Yes: NPRS scale: 5/10 Pain location: right knee, lt. Knee as well.   Pain description: aching pressure  Aggravating factors: walking, stairs Relieving factors: rest, does not use ice/heat    PERTINENT HISTORY: Scleroderma, Morphea Bilateral arthroscopic knee surgery 13 yrs approx    PRECAUTIONS: None   WEIGHT BEARING RESTRICTIONS: No   FALLS:  Has patient fallen in last 6 months?  Can occasionally lose her balance   PATIENT GOALS: I want to be able to get up better improve walking and posture   OBJECTIVE: (objective measures completed at initial evaluation unless otherwise dated)   DIAGNOSTIC FINDINGS:    06/2022: Severe lateral compartment narrowing with lateral  and intercondylar  osteophytes was noted.  Severe patellofemoral narrowing was noted.  No  chondrocalcinosis was noted.   Impression: These findings are consistent with severe osteoarthritis and  severe chondromalacia patella.      PATIENT SURVEYS:  FOTO 60% 11/03/22 FOTO 57%   COGNITION: Overall cognitive status: Within functional limits for tasks assessed                         SENSATION: WFL  Toes numb but not sure if she has neuropathy     EDEMA:  NT   MUSCLE LENGTH: NT   POSTURE: rounded shoulders, forward head, increased thoracic kyphosis, flexed trunk , and genu valgus   PALPATION: Pain medial compartment and TTP throughout  peri-patellar   LOWER EXTREMITY ROM:   Active ROM Right eval Left eval 11/03/22 Right 11/03/22 Left 11/08/22 R  Hip flexion         Hip extension         Hip abduction         Hip adduction         Hip internal rotation         Hip external rotation         Knee flexion 102 108 92 P! 110 98  Knee extension 8 11     Ankle dorsiflexion         Ankle plantarflexion         Ankle inversion         Ankle eversion          (Blank rows = not tested)   LOWER EXTREMITY MMT:   MMT Right eval Left eval  Hip flexion 4 4  Hip extension      Hip abduction 3- 3-  Hip adduction      Hip internal rotation      Hip external rotation      Knee flexion 4+ 4+  Knee extension 4+ 4+  Ankle dorsiflexion 5 5  Ankle plantarflexion      Ankle inversion      Ankle eversion       (Blank rows = not tested)   LOWER EXTREMITY SPECIAL TESTS:  NT    FUNCTIONAL TESTS:  5 times sit to stand: 43 sec : 11/03/22: 30 sec  30 seconds chair stand test Timed up and go (TUG): NT  2 minute walk test: 288 feet    GAIT: Distance walked: 150 Assistive device utilized: None Level of assistance: Modified independence Comments: trunk flexed, genu valgus      TODAY'S TREATMENT:    [facility] Adult PT Treatment:                                                DATE: 11/08/22 Therapeutic Exercise: Seated LAQ 3 lbs x 15  March x 10 each with 3 lbs  Wall for calf stretch x 3 each side  Supine hamstring stretch with strap, knee flexion AAROM with strap  Ball hamstring curls x 15  Ball bridge x 10   [facility] Adult PT Treatment:  DATE: 11/05/2022 Aquatic therapy at [address] - therapeutic pool temp 92 degrees Pt enters building ambulating independently with slowed gait pattern and valgus at knees.  Treatment took place in water 3.8 to  4 ft 8 in.feet deep depending upon activity.  Pt entered and exited the pool via stair and handrails with step to  pattern and slow cadence. Patient entered water for aquatic therapy for first time and was introduced to principles and therapeutic effects of water as they ambulated and acclimated to pool.  Therapeutic Exercise: Walking forward/ holding white barbell/side stepping At edge of pool, pt performed LE exercise: Hip abd/add x20 BIL Hip ext/flex with knee straight x 20 BIL Hamstring curl x20 BIL Squats 2x20 Step ups on submerged step x10 Rt leading Heel raises x20  Pt requires the buoyancy of water for active assisted exercises with buoyancy supported for strengthening and AROM exercises. Hydrostatic pressure also supports joints by unweighting joint load by at least 50 % in 3-4 feet depth water. 80% in chest to neck deep water. Water will provide assistance with movement using the current and laminar flow while the buoyancy reduces weight bearing. Pt requires the viscosity of the water for resistance with strengthening exercises.    [facility] Adult PT Treatment:                                                DATE: 11/03/22 Therapeutic Exercise: Nustep L3 x 5 minutes  Hamstring stretch 2 x 30 sec  LAQ x 25 each  Gastroc stretch at wall 2 x 20 sec SLR with QS x10 each  Heel slide AAROM.   Therapeutic Activity: 5 x STS FOTO 57 Squat at sink- -1/2 ROM to chair x 6 - too painful     [facility] Adult PT Treatment:                                                DATE: 10/27/22 Therapeutic Exercise: NuStep 74mn L3 UE/LE Knee AAROM heel slides x 10 each side with slider and green strap Hamstring curl x 10 with ball  Bridge legs on swiss ball 2x 10 SLR c quad set 10 each  Short arc quads 2x10 each LE (used black single bolster) Sidelying clam red band  x 10 decr ROM  Seated hamstring stretch x2 30" each Manual Therapy: Kinesiotaping to the R knee for support and pain. 3 l strips, 1 horizonal across the patella tendon 50%, then 1 medial and 1 lateral from the patella strip vertically around the  patella 25%, to the distal quad. All with tag ends without tension.  PATIENT EDUCATION:  Education details: PT/POC/HEP , orthopedics  Person educated: Patient Education method: Explanation, Demonstration, and Handouts Education comprehension: verbalized understanding, returned demonstration, and needs further education   HOME EXERCISE PROGRAM: Access Code: [email or web address] URL: [email or web address] Date: 10/21/2022 Prepared by: Gar Ponto  Exercises - Seated Hamstring Stretch  - 1 x daily - 7 x weekly - 1 sets - 3 reps - 30 hold - Gastroc Stretch on Wall  - 1 x daily - 7 x weekly - 1 sets - 3 reps - 30 hold - Hip Flexion  - 1 x daily - 7 x weekly - 1 sets - 5 reps - 3 hold - Standing Hip Flexor Stretch  - 1 x daily - 7 x weekly - 1 sets - 3 reps - 30 hold - Active Straight Leg Raise with Quad Set  - 1 x daily - 7 x weekly - 1 sets - 5-10 reps - 3 hold - Bridge with Hip Abduction and Resistance  - 1 x daily - 7 x weekly - 1 sets - 5-10 reps - 3 hold - Hooklying Clamshell with Resistance  - 1 x daily - 7 x weekly - 1 sets - 10 reps - 3 hold - Sit to Stand with Counter Support  - 1 x daily - 7 x weekly - 2 sets - 5 reps - Seated Long Arc Quad  - 1 x daily - 7 x weekly - 2 sets - 10 reps - 3 hold   ASSESSMENT:   CLINICAL IMPRESSION: Patient reports liking the aquatic session.  Discussed tightness in ankle and how it relates to the knee. Offered stretches to improve posterior knee and calf flexibility.  Needed min cues for technique.   OBJECTIVE IMPAIRMENTS: Abnormal gait, decreased activity tolerance, decreased mobility, difficulty walking, decreased ROM, decreased strength, hypomobility, increased fascial restrictions, impaired flexibility, improper body mechanics, postural dysfunction, and pain.    ACTIVITY LIMITATIONS: carrying, lifting, bending, standing,  squatting, stairs, transfers, locomotion level, and caring for others   PARTICIPATION LIMITATIONS: cleaning, laundry, shopping, and community activity   PERSONAL FACTORS: Age, Time since onset of injury/illness/exacerbation, and 1-2 comorbidities: scleroderma, previous knee scopes  are also affecting patient's functional outcome.     GOALS: Goals reviewed with patient? Yes   SHORT TERM GOALS: Target date: 11/05/2022     Pt will be I with HEP for LE strengthening  Baseline: Status: Ind Goal status: MET   2.  Pt will note greater ease of sit to stand transfers due to improved knee mobility Baseline: Status: 10/21/22= Pt reports effort level for STS is the same Goal status: ongoing    3.  Pt will be able to increase steps per day to 4000 Baseline: 2 [phone] steps  Status: 10/21/22= 4000-5000 per pt report Goal status: MET     LONG TERM GOALS: Target date: 11/19/2022     Pt will be I with final HEP  Baseline:  Goal status: INITIAL   2.  FOTO score will increase to 70% or better  Baseline: 60% Goal status: INITIAL   3.  Pt will be able to perform sit to stand x 5 (no UEs in < 30 sec)  Baseline: 43 sec with UE assist  Goal status: INITIAL   4.  Pt will be able to complete 2 min walk test to 350 feet or more Baseline: 288 feet Goal status: INITIAL   5.  Pt will increase hip strength to 4/5 to improve gait stability and support knees  Baseline: 3-/5 Goal status: INITIAL     PLAN:   PT FREQUENCY: 2x/week   PT DURATION: 8 weeks   PLANNED INTERVENTIONS: Therapeutic exercises, Therapeutic activity, Neuromuscular re-education, Balance training, Gait training, Patient/Family education, Self Care, Joint mobilization, Stair training, Aquatic Therapy, Cryotherapy, Moist heat, Taping, Manual therapy, and Re-evaluation   PLAN FOR NEXT SESSION: NuStep, hip strength as able, tolerated. progress ROM and strength .Aquatics , schedule 1 x water 1 x land per week .   Raeford Razor,  PT 11/08/22 3:08 PM Phone: [phone] Fax: [phone]

## 2022-11-08 ENCOUNTER — Ambulatory Visit: Payer: Medicare Other | Admitting: Physical Therapy

## 2022-11-08 ENCOUNTER — Encounter: Payer: Self-pay | Admitting: Physical Therapy

## 2022-11-08 DIAGNOSIS — M25562 Pain in left knee: Secondary | ICD-10-CM | POA: Diagnosis not present

## 2022-11-08 DIAGNOSIS — R262 Difficulty in walking, not elsewhere classified: Secondary | ICD-10-CM | POA: Diagnosis not present

## 2022-11-08 DIAGNOSIS — G8929 Other chronic pain: Secondary | ICD-10-CM

## 2022-11-08 DIAGNOSIS — M25561 Pain in right knee: Secondary | ICD-10-CM | POA: Diagnosis not present

## 2022-11-10 ENCOUNTER — Ambulatory Visit: Payer: Medicare Other | Admitting: Physical Therapy

## 2022-11-10 ENCOUNTER — Other Ambulatory Visit: Payer: Self-pay

## 2022-11-10 DIAGNOSIS — M25562 Pain in left knee: Secondary | ICD-10-CM | POA: Diagnosis not present

## 2022-11-10 DIAGNOSIS — R262 Difficulty in walking, not elsewhere classified: Secondary | ICD-10-CM | POA: Diagnosis not present

## 2022-11-10 DIAGNOSIS — M25561 Pain in right knee: Secondary | ICD-10-CM | POA: Diagnosis not present

## 2022-11-10 DIAGNOSIS — G8929 Other chronic pain: Secondary | ICD-10-CM | POA: Diagnosis not present

## 2022-11-10 NOTE — Therapy (Addendum)
OUTPATIENT PHYSICAL THERAPY TREATMENT NOTE/PROGRESS NOTE Progress Note Reporting Period 09-24-22 to 11-10-22  See note below for Objective Data and Assessment of Progress/Goals.       Patient Name: Rhonda Buchanan MRN: [id number] DOB:[birth year], 77 y.o., female 33 Date: 11/10/2022  PCP: Denita Lung, MD  REFERRING PROVIDER: Estanislado Pandy, MD  END OF SESSION:   PT End of Session - 11/10/22 1511     Visit Number 10    Number of Visits 16    Date for PT Re-Evaluation 11/19/22    Authorization Type [employer]    PT Start Time 9767    PT Stop Time 3419    PT Time Calculation (min) 40 min    Activity Tolerance Patient tolerated treatment well;Patient limited by pain                 Past Medical History:  Diagnosis Date   Allergy    Arthritis    Hypercholesterolemia    Hypertension    PUD (peptic ulcer disease)    Scleroderma ([place])    Past Surgical History:  Procedure Laterality Date   arthroscopy     CYST REMOVAL HAND     EYE SURGERY Left 10/25/2017   laser surgery    KNEE ARTHROPLASTY     Patient Active Problem List   Diagnosis Date Noted   Pain due to onychomycosis of toenails of both feet 08/24/2022   Callus 08/24/2022   PAD (peripheral artery disease) ([place]) 04/22/2022   Aortic atherosclerosis ([place]) 04/15/2021   High risk medication use 04/09/2020   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   Atherosclerosis of coronary artery of native heart without angina pectoris 10/05/2016   Aortic stenosis 05/21/2016   Abnormal PFTs 05/21/2016   Asthma due to environmental allergies 06/24/2015   Positive PPD, treated 06/05/2015   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/14/2014   Scleroderma ([place]) 07/12/2013   Hypertension 03/04/2011   Hyperlipidemia 03/04/2011   Allergic rhinitis 03/04/2011   Arthritis 03/04/2011   Migraine variant 04/18/1995    REFERRING DIAG: M17.0 (ICD-10-CM) - Primary osteoarthritis of both knees   THERAPY DIAG:  Chronic pain of left  knee  Difficulty in walking, not elsewhere classified  Chronic pain of right knee  Rationale for Evaluation and Treatment Rehabilitation  SUBJECTIVE:    SUBJECTIVE STATEMENT: Both my knees are 6/10 today (11-10-22)  RT> LT today    PAIN:  Are you having pain? Yes: NPRS scale: 5/10 Pain location: right knee, lt. Knee as well.   Pain description: aching pressure  Aggravating factors: walking, stairs Relieving factors: rest, does not use ice/heat    PERTINENT HISTORY: Scleroderma, Morphea Bilateral arthroscopic knee surgery 13 yrs approx    PRECAUTIONS: None   WEIGHT BEARING RESTRICTIONS: No   FALLS:  Has patient fallen in last 6 months?  Can occasionally lose her balance   PATIENT GOALS: I want to be able to get up better improve walking and posture   OBJECTIVE: (objective measures completed at initial evaluation unless otherwise dated)   DIAGNOSTIC FINDINGS:    06/2022: Severe lateral compartment narrowing with lateral and intercondylar  osteophytes was noted.  Severe patellofemoral narrowing was noted.  No  chondrocalcinosis was noted.   Impression: These findings are consistent with severe osteoarthritis and  severe chondromalacia patella.      PATIENT SURVEYS:  FOTO 60% 11/03/22 FOTO 57%   COGNITION: Overall cognitive status: Within functional limits for tasks assessed  SENSATION: WFL  Toes numb but not sure if she has neuropathy     EDEMA:  NT   MUSCLE LENGTH: NT   POSTURE: rounded shoulders, forward head, increased thoracic kyphosis, flexed trunk , and genu valgus   PALPATION: Pain medial compartment and TTP throughout peri-patellar   LOWER EXTREMITY ROM:   Active ROM Right eval Left eval 11/03/22 Right 11/03/22 Left 11/08/22 R  Hip flexion         Hip extension         Hip abduction         Hip adduction         Hip internal rotation         Hip external rotation         Knee flexion 102 108 92 P! 110 98  Knee  extension 8 11     Ankle dorsiflexion         Ankle plantarflexion         Ankle inversion         Ankle eversion          (Blank rows = not tested)   LOWER EXTREMITY MMT:   MMT Right eval Left eval  Hip flexion 4 4  Hip extension      Hip abduction 3- 3-  Hip adduction      Hip internal rotation      Hip external rotation      Knee flexion 4+ 4+  Knee extension 4+ 4+  Ankle dorsiflexion 5 5  Ankle plantarflexion      Ankle inversion      Ankle eversion       (Blank rows = not tested)   LOWER EXTREMITY SPECIAL TESTS:  NT    FUNCTIONAL TESTS:  5 times sit to stand: 43 sec : 11/03/22: 30 sec  30 seconds chair stand test Timed up and go (TUG): NT  2 minute walk test: 288 feet    GAIT: Distance walked: 150 Assistive device utilized: None Level of assistance: Modified independence Comments: trunk flexed, genu valgus      TODAY'S TREATMENT:   [facility] Adult PT Treatment:                                                DATE: 11-10-22 Aquatic therapy at [address] - therapeutic pool temp 91 degrees Pt enters building ambulating independently with slowed gait pattern and valgus at knees and no AD  Treatment took place in water 3.8 to  4 ft 8 in.feet deep depending upon activity.  Pt entered and exited the pool via stair and handrails with step to pattern and slow cadence.   Therapeutic Exercise: Walking forward/ holding white barbell/side stepping using yellow pool noodle and progressing to Rainbow DB At edge of pool, pt performed LE exercise: Hip abd/add x20 BIL Hip ext/flex with knee straight x 20 BIL Hamstring curl x20 BIL Squats 2x20 Step ups on submerged step x10 Rt leading Heel raises x20 Runners Stretch x 30" x 2 BIL Hamstring stretch x 30" x 2 BIL  Aqua Stretch to BIL quads, assisted quad stretch by PT  Pt requires the buoyancy of water for active assisted exercises with buoyancy supported for strengthening and AROM exercises. Hydrostatic  pressure also supports joints by unweighting joint load by at least 50 % in 3-4 feet  depth water. 80% in chest to neck deep water. Water will provide assistance with movement using the current and laminar flow while the buoyancy reduces weight bearing. Pt requires the viscosity of the water for resistance with strengthening exercises.   [facility] Adult PT Treatment:                                                DATE: 11/08/22 Therapeutic Exercise: Seated LAQ 3 lbs x 15  March x 10 each with 3 lbs  Wall for calf stretch x 3 each side  Supine hamstring stretch with strap, knee flexion AAROM with strap  Ball hamstring curls x 15  Ball bridge x 10   [facility] Adult PT Treatment:                                                DATE: 11/05/2022 Aquatic therapy at [address] - therapeutic pool temp 92 degrees Pt enters building ambulating independently with slowed gait pattern and valgus at knees.  Treatment took place in water 3.8 to  4 ft 8 in.feet deep depending upon activity.  Pt entered and exited the pool via stair and handrails with step to pattern and slow cadence. Patient entered water for aquatic therapy for first time and was introduced to principles and therapeutic effects of water as they ambulated and acclimated to pool.  Therapeutic Exercise: Walking forward/ holding white barbell/side stepping At edge of pool, pt performed LE exercise: Hip abd/add x20 BIL Hip ext/flex with knee straight x 20 BIL Hamstring curl x20 BIL Squats 2x20 Step ups on submerged step x10 Rt leading Heel raises x20  Pt requires the buoyancy of water for active assisted exercises with buoyancy supported for strengthening and AROM exercises. Hydrostatic pressure also supports joints by unweighting joint load by at least 50 % in 3-4 feet depth water. 80% in chest to neck deep water. Water will provide assistance with movement using the current and laminar flow while the buoyancy reduces weight bearing.  Pt requires the viscosity of the water for resistance with strengthening exercises.   [facility] Adult PT Treatment:                                                DATE: 11/03/22 Therapeutic Exercise: Nustep L3 x 5 minutes  Hamstring stretch 2 x 30 sec  LAQ x 25 each  Gastroc stretch at wall 2 x 20 sec SLR with QS x10 each  Heel slide AAROM.   Therapeutic Activity: 5 x STS FOTO 57 Squat at sink- -1/2 ROM to chair x 6 - too painful     [facility] Adult PT Treatment:                                                DATE: 10/27/22 Therapeutic Exercise: NuStep 11mn L3 UE/LE Knee AAROM heel slides x 10 each side with slider and green strap Hamstring curl x 10 with ball  Bridge legs on swiss ball 2x 10 SLR c quad set 10 each  Short arc quads 2x10 each LE (used black single bolster) Sidelying clam red band  x 10 decr ROM  Seated hamstring stretch x2 30" each Manual Therapy: Kinesiotaping to the R knee for support and pain. 3 l strips, 1 horizonal across the patella tendon 50%, then 1 medial and 1 lateral from the patella strip vertically around the patella 25%, to the distal quad. All with tag ends without tension.                                                                                                                          PATIENT EDUCATION:  Education details: PT/POC/HEP , orthopedics  Person educated: Patient Education method: Explanation, Demonstration, and Handouts Education comprehension: verbalized understanding, returned demonstration, and needs further education   HOME EXERCISE PROGRAM: Access Code: [email or web address] URL: [email or web address] Date: 10/21/2022 Prepared by: Gar Ponto  Exercises - Seated Hamstring Stretch  - 1 x daily - 7 x weekly - 1 sets - 3 reps - 30 hold - Gastroc Stretch on Wall  - 1 x daily - 7 x weekly - 1 sets - 3 reps - 30 hold - Hip Flexion  - 1 x daily - 7 x weekly - 1 sets - 5 reps - 3 hold - Standing Hip Flexor Stretch  - 1 x daily - 7 x  weekly - 1 sets - 3 reps - 30 hold - Active Straight Leg Raise with Quad Set  - 1 x daily - 7 x weekly - 1 sets - 5-10 reps - 3 hold - Bridge with Hip Abduction and Resistance  - 1 x daily - 7 x weekly - 1 sets - 5-10 reps - 3 hold - Hooklying Clamshell with Resistance  - 1 x daily - 7 x weekly - 1 sets - 10 reps - 3 hold - Sit to Stand with Counter Support  - 1 x daily - 7 x weekly - 2 sets - 5 reps - Seated Long Arc Quad  - 1 x daily - 7 x weekly - 2 sets - 10 reps - 3 hold   ASSESSMENT:   CLINICAL IMPRESSION: Ms Pigford enters aquatic pool room and is tentative about being in pool.  Pt takes extra time to ambulate and to perform exercises. Requiring yellow pool noodle and PT close by supervision. Session today focused on BIL LE strengthening and gait in water with increasing stride length as tolerated in the aquatic environment for use of buoyancy to offload joints and the viscosity of water as resistance during therapeutic exercise.Patient was able to tolerate all prescribed exercises in the aquatic environment with no adverse effects. Patient continues to benefit from skilled PT services on land and aquatic based and should be progressed as able to improve functional independence.    OBJECTIVE IMPAIRMENTS: Abnormal gait, decreased activity tolerance, decreased mobility, difficulty walking, decreased ROM, decreased strength, hypomobility, increased fascial restrictions,  impaired flexibility, improper body mechanics, postural dysfunction, and pain.    ACTIVITY LIMITATIONS: carrying, lifting, bending, standing, squatting, stairs, transfers, locomotion level, and caring for others   PARTICIPATION LIMITATIONS: cleaning, laundry, shopping, and community activity   PERSONAL FACTORS: Age, Time since onset of injury/illness/exacerbation, and 1-2 comorbidities: scleroderma, previous knee scopes  are also affecting patient's functional outcome.     GOALS: Goals reviewed with patient? Yes   SHORT  TERM GOALS: Target date: 11/05/2022     Pt will be I with HEP for LE strengthening  Baseline: Status: Ind Goal status: MET   2.  Pt will note greater ease of sit to stand transfers due to improved knee mobility Baseline: Status: 10/21/22= Pt reports effort level for STS is the same Goal status: ongoing    3.  Pt will be able to increase steps per day to 4000 Baseline: 2 [phone] steps  Status: 10/21/22= 4000-5000 per pt report Goal status: MET     LONG TERM GOALS: Target date: 11/19/2022     Pt will be I with final HEP  Baseline:  Goal status: INITIAL   2.  FOTO score will increase to 70% or better  Baseline: 60% Goal status: INITIAL   3.  Pt will be able to perform sit to stand x 5 (no UEs in < 30 sec)  Baseline: 43 sec with UE assist  Goal status: INITIAL   4.  Pt will be able to complete 2 min walk test to 350 feet or more Baseline: 288 feet Goal status: INITIAL   5.  Pt will increase hip strength to 4/5 to improve gait stability and support knees  Baseline: 3-/5 Goal status: INITIAL     PLAN:   PT FREQUENCY: 2x/week   PT DURATION: 8 weeks   PLANNED INTERVENTIONS: Therapeutic exercises, Therapeutic activity, Neuromuscular re-education, Balance training, Gait training, Patient/Family education, Self Care, Joint mobilization, Stair training, Aquatic Therapy, Cryotherapy, Moist heat, Taping, Manual therapy, and Re-evaluation   PLAN FOR NEXT SESSION: NuStep, hip strength as able, tolerated. progress ROM and strength .Aquatics , schedule 1 x water 1 x land per week .  Voncille Lo, PT, [place] Certified Exercise Expert for the Aging Adult  11/10/22 4:46 PM Phone: [phone] Fax: [phone]   Voncille Lo, Grandfather, [place] Certified Exercise Expert for the Aging Adult  11/17/22 3:14 PM Phone: [phone] Fax: [phone]

## 2022-11-16 NOTE — Therapy (Signed)
OUTPATIENT PHYSICAL THERAPY TREATMENT NOTE   Patient Name: Rhonda Buchanan MRN: [id number] DOB:[birth year], 77 y.o., female 33 Date: 11/17/2022  PCP: Denita Lung, MD  REFERRING PROVIDER: Estanislado Pandy, MD  END OF SESSION:   PT End of Session - 11/17/22 1509     Visit Number 11    Number of Visits 16    Date for PT Re-Evaluation 11/19/22    Authorization Type [employer]    PT Start Time 1509    PT Stop Time 7829    PT Time Calculation (min) 40 min    Activity Tolerance Patient tolerated treatment well;Patient limited by pain    Behavior During Therapy [facility] for tasks assessed/performed                 Past Medical History:  Diagnosis Date   Allergy    Arthritis    Hypercholesterolemia    Hypertension    PUD (peptic ulcer disease)    Scleroderma ([place])    Past Surgical History:  Procedure Laterality Date   arthroscopy     CYST REMOVAL HAND     EYE SURGERY Left 10/25/2017   laser surgery    KNEE ARTHROPLASTY     Patient Active Problem List   Diagnosis Date Noted   Pain due to onychomycosis of toenails of both feet 08/24/2022   Callus 08/24/2022   PAD (peripheral artery disease) ([place]) 04/22/2022   Aortic atherosclerosis ([place]) 04/15/2021   High risk medication use 04/09/2020   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   Atherosclerosis of coronary artery of native heart without angina pectoris 10/05/2016   Aortic stenosis 05/21/2016   Abnormal PFTs 05/21/2016   Asthma due to environmental allergies 06/24/2015   Positive PPD, treated 06/05/2015   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/14/2014   Scleroderma ([place]) 07/12/2013   Hypertension 03/04/2011   Hyperlipidemia 03/04/2011   Allergic rhinitis 03/04/2011   Arthritis 03/04/2011   Migraine variant 04/18/1995    REFERRING DIAG: M17.0 (ICD-10-CM) - Primary osteoarthritis of both knees   THERAPY DIAG:  Chronic pain of left knee  Difficulty in walking, not elsewhere classified  Chronic pain of right  knee  Rationale for Evaluation and Treatment Rehabilitation  SUBJECTIVE:    SUBJECTIVE STATEMENT: Both my knees are 4/10 today (11-17-22)  RT> LT today    PAIN:  Are you having pain? Yes: NPRS scale: 5/10 Pain location: right knee, lt. Knee as well.   Pain description: aching pressure  Aggravating factors: walking, stairs Relieving factors: rest, does not use ice/heat    PERTINENT HISTORY: Scleroderma, Morphea Bilateral arthroscopic knee surgery 13 yrs approx    PRECAUTIONS: None   WEIGHT BEARING RESTRICTIONS: No   FALLS:  Has patient fallen in last 6 months?  Can occasionally lose her balance   PATIENT GOALS: I want to be able to get up better improve walking and posture   OBJECTIVE: (objective measures completed at initial evaluation unless otherwise dated)   DIAGNOSTIC FINDINGS:    06/2022: Severe lateral compartment narrowing with lateral and intercondylar  osteophytes was noted.  Severe patellofemoral narrowing was noted.  No  chondrocalcinosis was noted.   Impression: These findings are consistent with severe osteoarthritis and  severe chondromalacia patella.      PATIENT SURVEYS:  FOTO 60% 11/03/22 FOTO 57%   COGNITION: Overall cognitive status: Within functional limits for tasks assessed  SENSATION: WFL  Toes numb but not sure if she has neuropathy     EDEMA:  NT   MUSCLE LENGTH: NT   POSTURE: rounded shoulders, forward head, increased thoracic kyphosis, flexed trunk , and genu valgus   PALPATION: Pain medial compartment and TTP throughout peri-patellar   LOWER EXTREMITY ROM:   Active ROM Right eval Left eval 11/03/22 Right 11/03/22 Left 11/08/22 R  Hip flexion         Hip extension         Hip abduction         Hip adduction         Hip internal rotation         Hip external rotation         Knee flexion 102 108 92 P! 110 98  Knee extension 8 11     Ankle dorsiflexion         Ankle plantarflexion         Ankle  inversion         Ankle eversion          (Blank rows = not tested)   LOWER EXTREMITY MMT:   MMT Right eval Left eval  Hip flexion 4 4  Hip extension      Hip abduction 3- 3-  Hip adduction      Hip internal rotation      Hip external rotation      Knee flexion 4+ 4+  Knee extension 4+ 4+  Ankle dorsiflexion 5 5  Ankle plantarflexion      Ankle inversion      Ankle eversion       (Blank rows = not tested)   LOWER EXTREMITY SPECIAL TESTS:  NT    FUNCTIONAL TESTS:  5 times sit to stand: 43 sec : 11/03/22: 30 sec  30 seconds chair stand test Timed up and go (TUG): NT  2 minute walk test: 288 feet    GAIT: Distance walked: 150 Assistive device utilized: None Level of assistance: Modified independence Comments: trunk flexed, genu valgus      TODAY'S TREATMENT:  [facility] Adult PT Treatment:                                                DATE: 1--31-24 Aquatic therapy at [address] - therapeutic pool temp 92 degrees Pt enters building ambulating independently with slowed gait pattern and valgus at knees and no AD  Treatment took place in water 3.8 to  4 ft 8 in.feet deep depending upon activity.  Pt entered and exited the pool via stair and handrails with step to pattern and slow cadence. Pt with 4/10 at beginning of session.  Pain at end of session bil knees 2-3/10  Aquatic Exercise: Walking forward/  backward holding yellow barbell At edge of pool, pt performed LE exercise: Squats 2x 20 VC for ER of hips to prevent knee valgus Hip abd/add x20 BIL Hip ext/flex with knee straight x 20 BIL Hamstring curl x20 BIL Step ups on submerged step x10 Rt leading Heel raises x20 Runners Stretch x 30" x 2 BIL Hamstring stretch x 30" x 2 BIL  Aqua Stretch to RT  quad, assisted quad stretch by PT  Pt requires the buoyancy of water for active assisted exercises with buoyancy supported for strengthening and AROM exercises. Hydrostatic  pressure also supports joints by  unweighting joint load by at least 50 % in 3-4 feet depth water. 80% in chest to neck deep water. Water will provide assistance with movement using the current and laminar flow while the buoyancy reduces weight bearing. Pt requires the viscosity of the water for resistance with strengthening exercises.    [facility] Adult PT Treatment:                                                DATE: 11-10-22 Aquatic therapy at [address] - therapeutic pool temp 91 degrees Pt enters building ambulating independently with slowed gait pattern and valgus at knees and no AD  Treatment took place in water 3.8 to  4 ft 8 in.feet deep depending upon activity.  Pt entered and exited the pool via stair and handrails with step to pattern and slow cadence.   Therapeutic Exercise: Walking forward/ holding white barbell/side stepping using yellow pool noodle and progressing to Rainbow DB At edge of pool, pt performed LE exercise: Hip abd/add x20 BIL Hip ext/flex with knee straight x 20 BIL Hamstring curl x20 BIL Squats 2x20 Step ups on submerged step x10 Rt leading Heel raises x20 Runners Stretch x 30" x 2 BIL Hamstring stretch x 30" x 2 BIL  Aqua Stretch to BIL quads, assisted quad stretch by PT  Pt requires the buoyancy of water for active assisted exercises with buoyancy supported for strengthening and AROM exercises. Hydrostatic pressure also supports joints by unweighting joint load by at least 50 % in 3-4 feet depth water. 80% in chest to neck deep water. Water will provide assistance with movement using the current and laminar flow while the buoyancy reduces weight bearing. Pt requires the viscosity of the water for resistance with strengthening exercises.   [facility] Adult PT Treatment:                                                DATE: 11/08/22 Therapeutic Exercise: Seated LAQ 3 lbs x 15  March x 10 each with 3 lbs  Wall for calf stretch x 3 each side  Supine hamstring stretch with strap, knee  flexion AAROM with strap  Ball hamstring curls x 15  Ball bridge x 10   [facility] Adult PT Treatment:                                                DATE: 11/05/2022 Aquatic therapy at [address] - therapeutic pool temp 92 degrees Pt enters building ambulating independently with slowed gait pattern and valgus at knees.  Treatment took place in water 3.8 to  4 ft 8 in.feet deep depending upon activity.  Pt entered and exited the pool via stair and handrails with step to pattern and slow cadence. Patient entered water for aquatic therapy for first time and was introduced to principles and therapeutic effects of water as they ambulated and acclimated to pool.  Therapeutic Exercise: Walking forward/ holding white barbell/side stepping At edge of pool, pt performed LE exercise: Hip abd/add x20 BIL Hip ext/flex  with knee straight x 20 BIL Hamstring curl x20 BIL Squats 2x20 Step ups on submerged step x10 Rt leading Heel raises x20  Pt requires the buoyancy of water for active assisted exercises with buoyancy supported for strengthening and AROM exercises. Hydrostatic pressure also supports joints by unweighting joint load by at least 50 % in 3-4 feet depth water. 80% in chest to neck deep water. Water will provide assistance with movement using the current and laminar flow while the buoyancy reduces weight bearing. Pt requires the viscosity of the water for resistance with strengthening exercises.   [facility] Adult PT Treatment:                                                DATE: 11/03/22 Therapeutic Exercise: Nustep L3 x 5 minutes  Hamstring stretch 2 x 30 sec  LAQ x 25 each  Gastroc stretch at wall 2 x 20 sec SLR with QS x10 each  Heel slide AAROM.   Therapeutic Activity: 5 x STS FOTO 57 Squat at sink- -1/2 ROM to chair x 6 - too painful     [facility] Adult PT Treatment:                                                DATE: 10/27/22 Therapeutic Exercise: NuStep 72mn L3  UE/LE Knee AAROM heel slides x 10 each side with slider and green strap Hamstring curl x 10 with ball  Bridge legs on swiss ball 2x 10 SLR c quad set 10 each  Short arc quads 2x10 each LE (used black single bolster) Sidelying clam red band  x 10 decr ROM  Seated hamstring stretch x2 30" each Manual Therapy: Kinesiotaping to the R knee for support and pain. 3 l strips, 1 horizonal across the patella tendon 50%, then 1 medial and 1 lateral from the patella strip vertically around the patella 25%, to the distal quad. All with tag ends without tension.                                                                                                                          PATIENT EDUCATION:  Education details: PT/POC/HEP , orthopedics  Person educated: Patient Education method: Explanation, Demonstration, and Handouts Education comprehension: verbalized understanding, returned demonstration, and needs further education   HOME EXERCISE PROGRAM: Access Code: [email or web address]: [email or web address] Date: 10/21/2022 Prepared by: AGar Ponto Exercises - Seated Hamstring Stretch  - 1 x daily - 7 x weekly - 1 sets - 3 reps - 30 hold - Gastroc Stretch on Wall  - 1 x daily - 7 x weekly - 1 sets - 3 reps - 30 hold - Hip Flexion  - 1 x  daily - 7 x weekly - 1 sets - 5 reps - 3 hold - Standing Hip Flexor Stretch  - 1 x daily - 7 x weekly - 1 sets - 3 reps - 30 hold - Active Straight Leg Raise with Quad Set  - 1 x daily - 7 x weekly - 1 sets - 5-10 reps - 3 hold - Bridge with Hip Abduction and Resistance  - 1 x daily - 7 x weekly - 1 sets - 5-10 reps - 3 hold - Hooklying Clamshell with Resistance  - 1 x daily - 7 x weekly - 1 sets - 10 reps - 3 hold - Sit to Stand with Counter Support  - 1 x daily - 7 x weekly - 2 sets - 5 reps - Seated Long Arc Quad  - 1 x daily - 7 x weekly - 2 sets - 10 reps - 3 hold   ASSESSMENT:   CLINICAL IMPRESSION: Ms Deprey enters aquatic pool room and enters  pool with greater confidence this week only using yellow DB for balance and ambulation in pool. about being in pool. Session today focused on BIL LE strengthening and gait in water with increasing stride length as tolerated in the aquatic environment for use of buoyancy to offload joints and the viscosity of water as resistance during therapeutic exercise.Patient was able to tolerate all prescribed exercises in the aquatic environment with no adverse effects.  Pt with 4/10 pain and reduced to 2-3/10 at end of session. Patient continues to benefit from skilled PT services on land and aquatic based and should be progressed as able to improve functional independence.    OBJECTIVE IMPAIRMENTS: Abnormal gait, decreased activity tolerance, decreased mobility, difficulty walking, decreased ROM, decreased strength, hypomobility, increased fascial restrictions, impaired flexibility, improper body mechanics, postural dysfunction, and pain.    ACTIVITY LIMITATIONS: carrying, lifting, bending, standing, squatting, stairs, transfers, locomotion level, and caring for others   PARTICIPATION LIMITATIONS: cleaning, laundry, shopping, and community activity   PERSONAL FACTORS: Age, Time since onset of injury/illness/exacerbation, and 1-2 comorbidities: scleroderma, previous knee scopes  are also affecting patient's functional outcome.     GOALS: Goals reviewed with patient? Yes   SHORT TERM GOALS: Target date: 11/05/2022     Pt will be I with HEP for LE strengthening  Baseline: Status: Ind Goal status: MET   2.  Pt will note greater ease of sit to stand transfers due to improved knee mobility Baseline: Status: 10/21/22= Pt reports effort level for STS is the same Goal status: ongoing    3.  Pt will be able to increase steps per day to 4000 Baseline: 2 [id number] steps  Status: 10/21/22= 4000-5000 per pt report Goal status: MET     LONG TERM GOALS: Target date: 11/19/2022     Pt will be I with final HEP   Baseline:  Goal status: INITIAL   2.  FOTO score will increase to 70% or better  Baseline: 60% Goal status: INITIAL   3.  Pt will be able to perform sit to stand x 5 (no UEs in < 30 sec)  Baseline: 43 sec with UE assist  Goal status: INITIAL   4.  Pt will be able to complete 2 min walk test to 350 feet or more Baseline: 288 feet Goal status: INITIAL   5.  Pt will increase hip strength to 4/5 to improve gait stability and support knees  Baseline: 3-/5 Goal status: INITIAL     PLAN:  PT FREQUENCY: 2x/week   PT DURATION: 8 weeks   PLANNED INTERVENTIONS: Therapeutic exercises, Therapeutic activity, Neuromuscular re-education, Balance training, Gait training, Patient/Family education, Self Care, Joint mobilization, Stair training, Aquatic Therapy, Cryotherapy, Moist heat, Taping, Manual therapy, and Re-evaluation   PLAN FOR NEXT SESSION: NuStep, hip strength as able, tolerated. progress ROM and strength .Aquatics , schedule 1 x water 1 x land per week .     Voncille Lo, PT, [place] Certified Exercise Expert for the Aging Adult  11/17/22 4:35 PM Phone: [phone] Fax: [phone]

## 2022-11-17 ENCOUNTER — Ambulatory Visit: Payer: Medicare Other | Admitting: Physical Therapy

## 2022-11-17 ENCOUNTER — Encounter: Payer: Self-pay | Admitting: Physical Therapy

## 2022-11-17 DIAGNOSIS — G8929 Other chronic pain: Secondary | ICD-10-CM

## 2022-11-17 DIAGNOSIS — R262 Difficulty in walking, not elsewhere classified: Secondary | ICD-10-CM | POA: Diagnosis not present

## 2022-11-17 DIAGNOSIS — M25561 Pain in right knee: Secondary | ICD-10-CM | POA: Diagnosis not present

## 2022-11-17 DIAGNOSIS — M25562 Pain in left knee: Secondary | ICD-10-CM | POA: Diagnosis not present

## 2022-11-19 ENCOUNTER — Encounter: Payer: Self-pay | Admitting: Physical Therapy

## 2022-11-19 ENCOUNTER — Ambulatory Visit: Payer: Medicare Other | Attending: Family Medicine | Admitting: Physical Therapy

## 2022-11-19 DIAGNOSIS — M25561 Pain in right knee: Secondary | ICD-10-CM | POA: Diagnosis not present

## 2022-11-19 DIAGNOSIS — G8929 Other chronic pain: Secondary | ICD-10-CM | POA: Insufficient documentation

## 2022-11-19 DIAGNOSIS — M25562 Pain in left knee: Secondary | ICD-10-CM | POA: Diagnosis not present

## 2022-11-19 DIAGNOSIS — R262 Difficulty in walking, not elsewhere classified: Secondary | ICD-10-CM | POA: Diagnosis not present

## 2022-11-19 NOTE — Therapy (Signed)
OUTPATIENT PHYSICAL THERAPY TREATMENT NOTE   Patient Name: Rhonda Buchanan MRN: [id number] DOB:[birth year], 77 y.o., female Today's Date: 11/19/2022  PCP: Denita Lung, MD  REFERRING PROVIDER: Estanislado Pandy, MD  END OF SESSION:   PT End of Session - 11/19/22 1230     Visit Number 12    Number of Visits 16    Date for PT Re-Evaluation 11/19/22    Authorization Type [employer] Medicare    PT Start Time 1232    PT Stop Time 0102    PT Time Calculation (min) 43 min                 Past Medical History:  Diagnosis Date   Allergy    Arthritis    Hypercholesterolemia    Hypertension    PUD (peptic ulcer disease)    Scleroderma ([place])    Past Surgical History:  Procedure Laterality Date   arthroscopy     CYST REMOVAL HAND     EYE SURGERY Left 10/25/2017   laser surgery    KNEE ARTHROPLASTY     Patient Active Problem List   Diagnosis Date Noted   Pain due to onychomycosis of toenails of both feet 08/24/2022   Callus 08/24/2022   PAD (peripheral artery disease) ([place]) 04/22/2022   Aortic atherosclerosis ([place]) 04/15/2021   High risk medication use 04/09/2020   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   Atherosclerosis of coronary artery of native heart without angina pectoris 10/05/2016   Aortic stenosis 05/21/2016   Abnormal PFTs 05/21/2016   Asthma due to environmental allergies 06/24/2015   Positive PPD, treated 06/05/2015   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/14/2014   Scleroderma ([place]) 07/12/2013   Hypertension 03/04/2011   Hyperlipidemia 03/04/2011   Allergic rhinitis 03/04/2011   Arthritis 03/04/2011   Migraine variant 04/18/1995    REFERRING DIAG: M17.0 (ICD-10-CM) - Primary osteoarthritis of both knees   THERAPY DIAG:  Chronic pain of left knee  Difficulty in walking, not elsewhere classified  Chronic pain of right knee  Rationale for Evaluation and Treatment Rehabilitation  SUBJECTIVE:    SUBJECTIVE STATEMENT: I need to extend PT. I can finally  get out of the car better.     PAIN:  Are you having pain? Yes: NPRS scale: 7/10 Pain location: right knee, lt. Knee as well 3/10 Pain description: aching pressure  Aggravating factors: walking, stairs Relieving factors: rest, does not use ice/heat    PERTINENT HISTORY: Scleroderma, Morphea Bilateral arthroscopic knee surgery 13 yrs approx    PRECAUTIONS: None   WEIGHT BEARING RESTRICTIONS: No   FALLS:  Has patient fallen in last 6 months?  Can occasionally lose her balance   PATIENT GOALS: I want to be able to get up better improve walking and posture   OBJECTIVE: (objective measures completed at initial evaluation unless otherwise dated)   DIAGNOSTIC FINDINGS:    06/2022: Severe lateral compartment narrowing with lateral and intercondylar  osteophytes was noted.  Severe patellofemoral narrowing was noted.  No  chondrocalcinosis was noted.   Impression: These findings are consistent with severe osteoarthritis and  severe chondromalacia patella.      PATIENT SURVEYS:  FOTO 60% 11/03/22 FOTO 57%   COGNITION: Overall cognitive status: Within functional limits for tasks assessed                         SENSATION: WFL  Toes numb but not sure if she has neuropathy     EDEMA:  NT   MUSCLE LENGTH: NT   POSTURE: rounded shoulders, forward head, increased thoracic kyphosis, flexed trunk , and genu valgus   PALPATION: Pain medial compartment and TTP throughout peri-patellar   LOWER EXTREMITY ROM:   Active ROM Right eval Left eval 11/03/22 Right 11/03/22 Left 11/08/22 R  Hip flexion         Hip extension         Hip abduction         Hip adduction         Hip internal rotation         Hip external rotation         Knee flexion 102 108 92 P! 110 98  Knee extension 8 11     Ankle dorsiflexion         Ankle plantarflexion         Ankle inversion         Ankle eversion          (Blank rows = not tested)   LOWER EXTREMITY MMT:   MMT Right eval Left eval   Hip flexion 4 4  Hip extension      Hip abduction 3- 3-  Hip adduction      Hip internal rotation      Hip external rotation      Knee flexion 4+ 4+  Knee extension 4+ 4+  Ankle dorsiflexion 5 5  Ankle plantarflexion      Ankle inversion      Ankle eversion       (Blank rows = not tested)   LOWER EXTREMITY SPECIAL TESTS:  NT    FUNCTIONAL TESTS:  5 times sit to stand: 43 sec : 11/03/22: 30 sec  30 seconds chair stand test Timed up and go (TUG): NT  2 minute walk test: 288 feet    GAIT: Distance walked: 150 Assistive device utilized: None Level of assistance: Modified independence Comments: trunk flexed, genu valgus      TODAY'S TREATMENT:  [facility] Adult PT Treatment:                                                DATE: 11/19/22 Therapeutic Exercise: Nustep L5 UE/LE x 5 minutes  Hamstring curl seated green 10 x 2 each Seated March 3# 10 x 2 each Seated LAQ 3# 10 x 2 each  Supine h/s curls, feet on ball 10 x 2  Green band clam x 10 - 90/90 feet on ball  Bridge with feet on ball  x 10    [facility] Adult PT Treatment:                                                DATE: 1--31-24 Aquatic therapy at [address] - therapeutic pool temp 92 degrees Pt enters building ambulating independently with slowed gait pattern and valgus at knees and no AD  Treatment took place in water 3.8 to  4 ft 8 in.feet deep depending upon activity.  Pt entered and exited the pool via stair and handrails with step to pattern and slow cadence. Pt with 4/10 at beginning of session.  Pain at end of session bil knees 2-3/10  Aquatic Exercise: Walking  forward/  backward holding yellow barbell At edge of pool, pt performed LE exercise: Squats 2x 20 VC for ER of hips to prevent knee valgus Hip abd/add x20 BIL Hip ext/flex with knee straight x 20 BIL Hamstring curl x20 BIL Step ups on submerged step x10 Rt leading Heel raises x20 Runners Stretch x 30" x 2 BIL Hamstring stretch x 30" x 2  BIL  Aqua Stretch to RT  quad, assisted quad stretch by PT  Pt requires the buoyancy of water for active assisted exercises with buoyancy supported for strengthening and AROM exercises. Hydrostatic pressure also supports joints by unweighting joint load by at least 50 % in 3-4 feet depth water. 80% in chest to neck deep water. Water will provide assistance with movement using the current and laminar flow while the buoyancy reduces weight bearing. Pt requires the viscosity of the water for resistance with strengthening exercises.    [place] Adult PT Treatment:                                                DATE: 11-10-22 Aquatic therapy at [address] - therapeutic pool temp 91 degrees Pt enters building ambulating independently with slowed gait pattern and valgus at knees and no AD  Treatment took place in water 3.8 to  4 ft 8 in.feet deep depending upon activity.  Pt entered and exited the pool via stair and handrails with step to pattern and slow cadence.   Therapeutic Exercise: Walking forward/ holding white barbell/side stepping using yellow pool noodle and progressing to Rainbow DB At edge of pool, pt performed LE exercise: Hip abd/add x20 BIL Hip ext/flex with knee straight x 20 BIL Hamstring curl x20 BIL Squats 2x20 Step ups on submerged step x10 Rt leading Heel raises x20 Runners Stretch x 30" x 2 BIL Hamstring stretch x 30" x 2 BIL  Aqua Stretch to BIL quads, assisted quad stretch by PT  Pt requires the buoyancy of water for active assisted exercises with buoyancy supported for strengthening and AROM exercises. Hydrostatic pressure also supports joints by unweighting joint load by at least 50 % in 3-4 feet depth water. 80% in chest to neck deep water. Water will provide assistance with movement using the current and laminar flow while the buoyancy reduces weight bearing. Pt requires the viscosity of the water for resistance with strengthening exercises.   [facility]  Adult PT Treatment:                                                DATE: 11/08/22 Therapeutic Exercise: Seated LAQ 3 lbs x 15  March x 10 each with 3 lbs  Wall for calf stretch x 3 each side  Supine hamstring stretch with strap, knee flexion AAROM with strap  Ball hamstring curls x 15  Ball bridge x 10   [facility] Adult PT Treatment:                                                DATE: 11/05/2022 Aquatic therapy at [address] - therapeutic pool temp  92 degrees Pt enters building ambulating independently with slowed gait pattern and valgus at knees.  Treatment took place in water 3.8 to  4 ft 8 in.feet deep depending upon activity.  Pt entered and exited the pool via stair and handrails with step to pattern and slow cadence. Patient entered water for aquatic therapy for first time and was introduced to principles and therapeutic effects of water as they ambulated and acclimated to pool.  Therapeutic Exercise: Walking forward/ holding white barbell/side stepping At edge of pool, pt performed LE exercise: Hip abd/add x20 BIL Hip ext/flex with knee straight x 20 BIL Hamstring curl x20 BIL Squats 2x20 Step ups on submerged step x10 Rt leading Heel raises x20  Pt requires the buoyancy of water for active assisted exercises with buoyancy supported for strengthening and AROM exercises. Hydrostatic pressure also supports joints by unweighting joint load by at least 50 % in 3-4 feet depth water. 80% in chest to neck deep water. Water will provide assistance with movement using the current and laminar flow while the buoyancy reduces weight bearing. Pt requires the viscosity of the water for resistance with strengthening exercises.   [facility] Adult PT Treatment:                                                DATE: 11/03/22 Therapeutic Exercise: Nustep L3 x 5 minutes  Hamstring stretch 2 x 30 sec  LAQ x 25 each  Gastroc stretch at wall 2 x 20 sec SLR with QS x10 each  Heel slide AAROM.    Therapeutic Activity: 5 x STS FOTO 57 Squat at sink- -1/2 ROM to chair x 6 - too painful     [facility] Adult PT Treatment:                                                DATE: 10/27/22 Therapeutic Exercise: NuStep 1mn L3 UE/LE Knee AAROM heel slides x 10 each side with slider and green strap Hamstring curl x 10 with ball  Bridge legs on swiss ball 2x 10 SLR c quad set 10 each  Short arc quads 2x10 each LE (used black single bolster) Sidelying clam red band  x 10 decr ROM  Seated hamstring stretch x2 30" each Manual Therapy: Kinesiotaping to the R knee for support and pain. 3 l strips, 1 horizonal across the patella tendon 50%, then 1 medial and 1 lateral from the patella strip vertically around the patella 25%, to the distal quad. All with tag ends without tension.  PATIENT EDUCATION:  Education details: PT/POC/HEP , orthopedics  Person educated: Patient Education method: Explanation, Demonstration, and Handouts Education comprehension: verbalized understanding, returned demonstration, and needs further education   HOME EXERCISE PROGRAM: Access Code: [email or web address] URL: [email or web address] Date: 10/21/2022 Prepared by: Gar Ponto  Exercises - Seated Hamstring Stretch  - 1 x daily - 7 x weekly - 1 sets - 3 reps - 30 hold - Gastroc Stretch on Wall  - 1 x daily - 7 x weekly - 1 sets - 3 reps - 30 hold - Hip Flexion  - 1 x daily - 7 x weekly - 1 sets - 5 reps - 3 hold - Standing Hip Flexor Stretch  - 1 x daily - 7 x weekly - 1 sets - 3 reps - 30 hold - Active Straight Leg Raise with Quad Set  - 1 x daily - 7 x weekly - 1 sets - 5-10 reps - 3 hold - Bridge with Hip Abduction and Resistance  - 1 x daily - 7 x weekly - 1 sets - 5-10 reps - 3 hold - Hooklying Clamshell with Resistance  - 1 x daily - 7 x weekly - 1 sets - 10 reps - 3 hold - Sit to Stand  with Counter Support  - 1 x daily - 7 x weekly - 2 sets - 5 reps - Seated Long Arc Quad  - 1 x daily - 7 x weekly - 2 sets - 10 reps - 3 hold   ASSESSMENT:   CLINICAL IMPRESSION: Ms Yakel arrives reporting bilateral knee pain right > left. Continued with mat based strengthening as she reports land based closed chain activities are what started her left knee hurting more. She voices concern with impending knee replacement and has follow up in June to discuss next options. She is scheduled with primary PT next session for a reevaluation and possible extension on current POC.  Patient continues to benefit from skilled PT services on land and aquatic based and should be progressed as able to improve functional independence.    OBJECTIVE IMPAIRMENTS: Abnormal gait, decreased activity tolerance, decreased mobility, difficulty walking, decreased ROM, decreased strength, hypomobility, increased fascial restrictions, impaired flexibility, improper body mechanics, postural dysfunction, and pain.    ACTIVITY LIMITATIONS: carrying, lifting, bending, standing, squatting, stairs, transfers, locomotion level, and caring for others   PARTICIPATION LIMITATIONS: cleaning, laundry, shopping, and community activity   PERSONAL FACTORS: Age, Time since onset of injury/illness/exacerbation, and 1-2 comorbidities: scleroderma, previous knee scopes  are also affecting patient's functional outcome.     GOALS: Goals reviewed with patient? Yes   SHORT TERM GOALS: Target date: 11/05/2022     Pt will be I with HEP for LE strengthening  Baseline: Status: Ind Goal status: MET   2.  Pt will note greater ease of sit to stand transfers due to improved knee mobility Baseline: Status: 10/21/22= Pt reports effort level for STS is the same Goal status: ongoing    3.  Pt will be able to increase steps per day to 4000 Baseline: 2 [phone] steps  Status: 10/21/22= 4000-5000 per pt report Goal status: MET     LONG TERM  GOALS: Target date: 11/19/2022     Pt will be I with final HEP  Baseline:  Goal status: INITIAL   2.  FOTO score will increase to 70% or better  Baseline: 60% Goal status: INITIAL   3.  Pt will be able to perform sit to stand x 5 (no UEs in <  30 sec)  Baseline: 43 sec with UE assist  Goal status: INITIAL   4.  Pt will be able to complete 2 min walk test to 350 feet or more Baseline: 288 feet Goal status: INITIAL   5.  Pt will increase hip strength to 4/5 to improve gait stability and support knees  Baseline: 3-/5 Goal status: INITIAL     PLAN:   PT FREQUENCY: 2x/week   PT DURATION: 8 weeks   PLANNED INTERVENTIONS: Therapeutic exercises, Therapeutic activity, Neuromuscular re-education, Balance training, Gait training, Patient/Family education, Self Care, Joint mobilization, Stair training, Aquatic Therapy, Cryotherapy, Moist heat, Taping, Manual therapy, and Re-evaluation   PLAN FOR NEXT SESSION: NuStep, hip strength as able, tolerated. progress ROM and strength .Aquatics , schedule 1 x water 1 x land per week .    Hessie Diener, PTA 11/19/22 2:15 PM Phone: [phone] Fax: [phone]

## 2022-11-22 ENCOUNTER — Ambulatory Visit: Payer: Medicare Other | Admitting: Physical Therapy

## 2022-11-22 ENCOUNTER — Encounter: Payer: Self-pay | Admitting: Physical Therapy

## 2022-11-22 DIAGNOSIS — G8929 Other chronic pain: Secondary | ICD-10-CM | POA: Diagnosis not present

## 2022-11-22 DIAGNOSIS — R262 Difficulty in walking, not elsewhere classified: Secondary | ICD-10-CM

## 2022-11-22 DIAGNOSIS — M25562 Pain in left knee: Secondary | ICD-10-CM | POA: Diagnosis not present

## 2022-11-22 DIAGNOSIS — M25561 Pain in right knee: Secondary | ICD-10-CM | POA: Diagnosis not present

## 2022-11-22 NOTE — Therapy (Signed)
OUTPATIENT PHYSICAL THERAPY TREATMENT NOTE   Patient Name: Rhonda Buchanan MRN: [id number] DOB:[birth year], 77 y.o., female Today's Date: 11/22/2022  PCP: Denita Lung, MD  REFERRING PROVIDER: Estanislado Pandy, MD  END OF SESSION:   PT End of Session - 11/22/22 1333     Visit Number 13    Date for PT Re-Evaluation 01/17/23    Authorization Type [employer] Medicare    PT Start Time 1332    PT Stop Time 9470    PT Time Calculation (min) 44 min    Activity Tolerance Patient tolerated treatment well;Patient limited by pain    Behavior During Therapy [facility] for tasks assessed/performed                  Past Medical History:  Diagnosis Date   Allergy    Arthritis    Hypercholesterolemia    Hypertension    PUD (peptic ulcer disease)    Scleroderma ([place])    Past Surgical History:  Procedure Laterality Date   arthroscopy     CYST REMOVAL HAND     EYE SURGERY Left 10/25/2017   laser surgery    KNEE ARTHROPLASTY     Patient Active Problem List   Diagnosis Date Noted   Pain due to onychomycosis of toenails of both feet 08/24/2022   Callus 08/24/2022   PAD (peripheral artery disease) ([place]) 04/22/2022   Aortic atherosclerosis ([place]) 04/15/2021   High risk medication use 04/09/2020   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   Atherosclerosis of coronary artery of native heart without angina pectoris 10/05/2016   Aortic stenosis 05/21/2016   Abnormal PFTs 05/21/2016   Asthma due to environmental allergies 06/24/2015   Positive PPD, treated 06/05/2015   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/14/2014   Scleroderma ([place]) 07/12/2013   Hypertension 03/04/2011   Hyperlipidemia 03/04/2011   Allergic rhinitis 03/04/2011   Arthritis 03/04/2011   Migraine variant 04/18/1995    REFERRING DIAG: M17.0 (ICD-10-CM) - Primary osteoarthritis of both knees   THERAPY DIAG:  Chronic pain of left knee  Difficulty in walking, not elsewhere classified  Chronic pain of right knee  Rationale  for Evaluation and Treatment Rehabilitation  SUBJECTIVE:    SUBJECTIVE STATEMENT: I would like to keep coming.  Dr. Estanislado Pandy is handling my care more than my primary.     PAIN:  Are you having pain? Yes: NPRS scale: 6 /10 Pain location:Rt  Knee as well 3/10 Pain description: aching pressure  Aggravating factors: walking, stairs Relieving factors: rest, does not use ice/heat    PERTINENT HISTORY: Scleroderma, Morphea Bilateral arthroscopic knee surgery 13 yrs approx    PRECAUTIONS: None   WEIGHT BEARING RESTRICTIONS: No   FALLS:  Has patient fallen in last 6 months?  Can occasionally lose her balance   PATIENT GOALS: I want to be able to get up better improve walking and posture   OBJECTIVE: (objective measures completed at initial evaluation unless otherwise dated)   DIAGNOSTIC FINDINGS:    06/2022: Severe lateral compartment narrowing with lateral and intercondylar  osteophytes was noted.  Severe patellofemoral narrowing was noted.  No  chondrocalcinosis was noted.   Impression: These findings are consistent with severe osteoarthritis and  severe chondromalacia patella.      PATIENT SURVEYS:  FOTO 60% 11/03/22 FOTO 57%   COGNITION: Overall cognitive status: Within functional limits for tasks assessed  SENSATION: WFL  Toes numb but not sure if she has neuropathy     EDEMA:  NT   MUSCLE LENGTH: NT   POSTURE: rounded shoulders, forward head, increased thoracic kyphosis, flexed trunk , and genu valgus   PALPATION: Pain medial compartment and TTP throughout peri-patellar   LOWER EXTREMITY ROM:   Active ROM Right eval Left eval 11/03/22 Right 11/03/22 Left 11/08/22 Rt  Hip flexion         Hip extension         Hip abduction         Hip adduction         Hip internal rotation         Hip external rotation         Knee flexion 102 108 92 P! 110 98  Knee extension 8 11     Ankle dorsiflexion         Ankle plantarflexion          Ankle inversion         Ankle eversion          (Blank rows = not tested)   LOWER EXTREMITY MMT:   MMT Right eval Left eval Rt/Lt. 11/22/22  Hip flexion 4 4 4+/5, 4+/5  Hip extension       Hip abduction 3- 3-   Hip adduction       Hip internal rotation       Hip external rotation       Knee flexion 4+ 4+ 4+/5 pain, 4+/5   Knee extension 4+ 4+ 4+/5 pain, 4+/5  Ankle dorsiflexion 5 5   Ankle plantarflexion       Ankle inversion       Ankle eversion        (Blank rows = not tested)   LOWER EXTREMITY SPECIAL TESTS:  NT    FUNCTIONAL TESTS:  5 times sit to stand: 43 sec : 11/03/22: 30 sec   11/22/22: lacks hip extension, uses hands on thighs : 33 sec pain  30 seconds chair stand test _NT  Timed up and go (TUG): NT  2 minute walk test: EVAL: 288 feet   11/22/22: 312 feet    GAIT: Distance walked: 150 Assistive device utilized: None Level of assistance: Modified independence Comments: trunk flexed, genu valgus      TODAY'S TREATMENT:    [facility] Adult PT Treatment:                                                DATE: 11/22/22 Therapeutic Exercise: Nustep L4 UE and LE cues for avoiding collapse in knees  MMT Sit to stand  x 5 Supine hip abd/ER green band x 15 (vs seated at home) Bridge green x 15  Quad set Rt x 10  SLR  x 10  AAROM Rt knee  LAQ Rt x 15 2 min walk 312 feet, pain still 6/10 Hip abduction standing  x10 each side    [facility] Adult PT Treatment:                                                DATE: 11/19/22 Therapeutic Exercise: Nustep L5 UE/LE x 5 minutes  Hamstring curl seated  green 10 x 2 each Seated March 3# 10 x 2 each Seated LAQ 3# 10 x 2 each  Supine h/s curls, feet on ball 10 x 2  Green band clam x 10 - 90/90 feet on ball  Bridge with feet on ball  x 10    [facility] Adult PT Treatment:                                                DATE: 1--31-24 Aquatic therapy at [address] - therapeutic pool temp 92 degrees Pt enters building  ambulating independently with slowed gait pattern and valgus at knees and no AD  Treatment took place in water 3.8 to  4 ft 8 in.feet deep depending upon activity.  Pt entered and exited the pool via stair and handrails with step to pattern and slow cadence. Pt with 4/10 at beginning of session.  Pain at end of session bil knees 2-3/10  Aquatic Exercise: Walking forward/  backward holding yellow barbell At edge of pool, pt performed LE exercise: Squats 2x 20 VC for ER of hips to prevent knee valgus Hip abd/add x20 BIL Hip ext/flex with knee straight x 20 BIL Hamstring curl x20 BIL Step ups on submerged step x10 Rt leading Heel raises x20 Runners Stretch x 30" x 2 BIL Hamstring stretch x 30" x 2 BIL  Aqua Stretch to RT  quad, assisted quad stretch by PT  Pt requires the buoyancy of water for active assisted exercises with buoyancy supported for strengthening and AROM exercises. Hydrostatic pressure also supports joints by unweighting joint load by at least 50 % in 3-4 feet depth water. 80% in chest to neck deep water. Water will provide assistance with movement using the current and laminar flow while the buoyancy reduces weight bearing. Pt requires the viscosity of the water for resistance with strengthening exercises.    [facility] Adult PT Treatment:                                                DATE: 11-10-22 Aquatic therapy at [address] - therapeutic pool temp 91 degrees Pt enters building ambulating independently with slowed gait pattern and valgus at knees and no AD  Treatment took place in water 3.8 to  4 ft 8 in.feet deep depending upon activity.  Pt entered and exited the pool via stair and handrails with step to pattern and slow cadence.   Therapeutic Exercise: Walking forward/ holding white barbell/side stepping using yellow pool noodle and progressing to Rainbow DB At edge of pool, pt performed LE exercise: Hip abd/add x20 BIL Hip ext/flex with knee straight x  20 BIL Hamstring curl x20 BIL Squats 2x20 Step ups on submerged step x10 Rt leading Heel raises x20 Runners Stretch x 30" x 2 BIL Hamstring stretch x 30" x 2 BIL  Aqua Stretch to BIL quads, assisted quad stretch by PT  Pt requires the buoyancy of water for active assisted exercises with buoyancy supported for strengthening and AROM exercises. Hydrostatic pressure also supports joints by unweighting joint load by at least 50 % in 3-4 feet depth water. 80% in chest to neck deep water. Water will provide assistance with movement using the current  and laminar flow while the buoyancy reduces weight bearing. Pt requires the viscosity of the water for resistance with strengthening exercises.   [facility] Adult PT Treatment:                                                DATE: 11/08/22 Therapeutic Exercise: Seated LAQ 3 lbs x 15  March x 10 each with 3 lbs  Wall for calf stretch x 3 each side  Supine hamstring stretch with strap, knee flexion AAROM with strap  Ball hamstring curls x 15  Ball bridge x 10   [facility] Adult PT Treatment:                                                DATE: 11/05/2022 Aquatic therapy at [address] - therapeutic pool temp 92 degrees Pt enters building ambulating independently with slowed gait pattern and valgus at knees.  Treatment took place in water 3.8 to  4 ft 8 in.feet deep depending upon activity.  Pt entered and exited the pool via stair and handrails with step to pattern and slow cadence. Patient entered water for aquatic therapy for first time and was introduced to principles and therapeutic effects of water as they ambulated and acclimated to pool.  Therapeutic Exercise: Walking forward/ holding white barbell/side stepping At edge of pool, pt performed LE exercise: Hip abd/add x20 BIL Hip ext/flex with knee straight x 20 BIL Hamstring curl x20 BIL Squats 2x20 Step ups on submerged step x10 Rt leading Heel raises x20  Pt requires the buoyancy  of water for active assisted exercises with buoyancy supported for strengthening and AROM exercises. Hydrostatic pressure also supports joints by unweighting joint load by at least 50 % in 3-4 feet depth water. 80% in chest to neck deep water. Water will provide assistance with movement using the current and laminar flow while the buoyancy reduces weight bearing. Pt requires the viscosity of the water for resistance with strengthening exercises.   [facility] Adult PT Treatment:                                                DATE: 11/03/22 Therapeutic Exercise: Nustep L3 x 5 minutes  Hamstring stretch 2 x 30 sec  LAQ x 25 each  Gastroc stretch at wall 2 x 20 sec SLR with QS x10 each  Heel slide AAROM.   Therapeutic Activity: 5 x STS FOTO 57 Squat at sink- -1/2 ROM to chair x 6 - too painful     [facility] Adult PT Treatment:                                                DATE: 10/27/22 Therapeutic Exercise: NuStep 35mn L3 UE/LE Knee AAROM heel slides x 10 each side with slider and green strap Hamstring curl x 10 with ball  Bridge legs on swiss ball 2x 10 SLR c quad set 10 each  Short arc quads  2x10 each LE (used black single bolster) Sidelying clam red band  x 10 decr ROM  Seated hamstring stretch x2 30" each Manual Therapy: Kinesiotaping to the R knee for support and pain. 3 l strips, 1 horizonal across the patella tendon 50%, then 1 medial and 1 lateral from the patella strip vertically around the patella 25%, to the distal quad. All with tag ends without tension.                                                                                                                          PATIENT EDUCATION:  Education details: PT/POC/HEP , orthopedics  Person educated: Patient Education method: Explanation, Demonstration, and Handouts Education comprehension: verbalized understanding, returned demonstration, and needs further education   HOME EXERCISE PROGRAM: Access Code: [email or web address] URL:  [email or web address] Date: 10/21/2022 Prepared by: Gar Ponto  Exercises - Seated Hamstring Stretch  - 1 x daily - 7 x weekly - 1 sets - 3 reps - 30 hold - Gastroc Stretch on Wall  - 1 x daily - 7 x weekly - 1 sets - 3 reps - 30 hold - Hip Flexion  - 1 x daily - 7 x weekly - 1 sets - 5 reps - 3 hold - Standing Hip Flexor Stretch  - 1 x daily - 7 x weekly - 1 sets - 3 reps - 30 hold - Active Straight Leg Raise with Quad Set  - 1 x daily - 7 x weekly - 1 sets - 5-10 reps - 3 hold - Bridge with Hip Abduction and Resistance  - 1 x daily - 7 x weekly - 1 sets - 5-10 reps - 3 hold - Hooklying Clamshell with Resistance  - 1 x daily - 7 x weekly - 1 sets - 10 reps - 3 hold - Sit to Stand with Counter Support  - 1 x daily - 7 x weekly - 2 sets - 5 reps - Seated Long Arc Quad  - 1 x daily - 7 x weekly - 2 sets - 10 reps - 3 hold   ASSESSMENT:   CLINICAL IMPRESSION:  Patient continues to have pain in Rt knee with activity, standing, walking and mostly limited in R knee flexion.  Today she had significant pain but she was able to walk from there and previous.  She plans to consult Dr. Estanislado Pandy for an ortho referral for total knee replacement if needed.   Patient continues to benefit from skilled PT services on land and aquatic based and should be progressed as able to improve functional independence.    OBJECTIVE IMPAIRMENTS: Abnormal gait, decreased activity tolerance, decreased mobility, difficulty walking, decreased ROM, decreased strength, hypomobility, increased fascial restrictions, impaired flexibility, improper body mechanics, postural dysfunction, and pain.    ACTIVITY LIMITATIONS: carrying, lifting, bending, standing, squatting, stairs, transfers, locomotion level, and caring for others   PARTICIPATION LIMITATIONS: cleaning, laundry, shopping, and community activity   PERSONAL FACTORS: Age, Time since onset of injury/illness/exacerbation, and  1-2 comorbidities:  scleroderma, previous knee scopes  are also affecting patient's functional outcome.     GOALS: Goals reviewed with patient? Yes   SHORT TERM GOALS: Target date: 11/05/2022     Pt will be I with HEP for LE strengthening  Baseline: Status: Ind Goal status: MET   2.  Pt will note greater ease of sit to stand transfers due to improved knee mobility Baseline: Status: 10/21/22= Pt reports effort level for STS is the same Goal status:  ongoing     3.  Pt will be able to increase steps per day to 4000 Baseline: 2 [phone] steps  Status: 10/21/22= 4000-5000 per pt report Goal status: MET     LONG TERM GOALS: Target date: 11/19/2022     Pt will be I with final HEP  Baseline:  Goal status:ongoing    2.  FOTO score will increase to 70% or better  Baseline: 60% Goal status: ongoing    3.  Pt will be able to perform sit to stand x 5 (no UEs in < 30 sec)  Baseline: 43 sec with UE assist  Goal status: ongoing    4.  Pt will be able to complete 2 min walk test to 350 feet or more Baseline: 288 feet, 312 feet  Goal status: ongoing    5.  Pt will increase hip strength to 4/5 to improve gait stability and support knees  Baseline: 3-/5 Goal status: ongoing    PLAN:   PT FREQUENCY: 2x/week   PT DURATION: 8 weeks   PLANNED INTERVENTIONS: Therapeutic exercises, Therapeutic activity, Neuromuscular re-education, Balance training, Gait training, Patient/Family education, Self Care, Joint mobilization, Stair training, Aquatic Therapy, Cryotherapy, Moist heat, Taping, Manual therapy, and Re-evaluation   PLAN FOR NEXT SESSION: NuStep, hip strength as able, tolerated. progress ROM and strength .Aquatics , schedule 1 x water 1 x land per week .    Raeford Razor, PT 11/22/22 3:14 PM Phone: [phone] Fax: [phone]

## 2022-11-24 ENCOUNTER — Encounter: Payer: Self-pay | Admitting: Podiatry

## 2022-11-24 ENCOUNTER — Ambulatory Visit: Payer: Medicare Other | Admitting: Podiatry

## 2022-11-24 DIAGNOSIS — B351 Tinea unguium: Secondary | ICD-10-CM

## 2022-11-24 DIAGNOSIS — M79675 Pain in left toe(s): Secondary | ICD-10-CM

## 2022-11-24 DIAGNOSIS — M79674 Pain in right toe(s): Secondary | ICD-10-CM

## 2022-11-24 DIAGNOSIS — Q828 Other specified congenital malformations of skin: Secondary | ICD-10-CM

## 2022-11-24 NOTE — Progress Notes (Signed)
This patient returns to my office for at risk foot care.  This patient requires this care by a professional since this patient will be at risk due to having  PAD.  This patient is unable to cut nails herself since the patient cannot reach her nails.These nails are painful walking and wearing shoes.  This patient presents for at risk foot care today.  General Appearance  Alert, conversant and in no acute stress.  Vascular  Dorsalis pedis and posterior tibial  pulses are palpable  bilaterally.  Capillary return is within normal limits  bilaterally. Temperature is within normal limits  bilaterally.  Neurologic  Senn-Weinstein monofilament wire test within normal limits  bilaterally. Muscle power within normal limits bilaterally.  Nails Thick disfigured discolored nails with subungual debris  from hallux to fifth toes bilaterally. No evidence of bacterial infection or drainage bilaterally.  Orthopedic  No limitations of motion  feet .  No crepitus or effusions noted.  No bony pathology or digital deformities noted.  Skin  normotropic skin with no porokeratosis noted bilaterally.  No signs of infections or ulcers noted.   Callus sub 5th  B/L.  Onychomycosis  Pain in right toes  Pain in left toes  Porokeratosis   B/L.  Consent was obtained for treatment procedures.   Mechanical debridement of nails 1-5  bilaterally performed with a nail nipper.  Filed with dremel without incident. Debride callus sub 5th met  B/L.   Return office visit  3 months                    Told patient to return for periodic foot care and evaluation due to potential at risk complications.   Gardiner Barefoot DPM

## 2022-11-24 NOTE — Therapy (Signed)
OUTPATIENT PHYSICAL THERAPY TREATMENT NOTE   Patient Name: Rhonda Buchanan MRN: [id number] DOB:[birth year], 77 y.o., female Today's Date: 11/25/2022  PCP: Denita Lung, MD  REFERRING PROVIDER: Estanislado Pandy, MD  END OF SESSION:   PT End of Session - 11/25/22 1125     Visit Number 14    Date for PT Re-Evaluation 01/17/23    Authorization Type [employer] Medicare    PT Start Time 1104    PT Stop Time 8119    PT Time Calculation (min) 41 min    Activity Tolerance Patient tolerated treatment well;Patient limited by pain    Behavior During Therapy [facility] for tasks assessed/performed                   Past Medical History:  Diagnosis Date   Allergy    Arthritis    Hypercholesterolemia    Hypertension    PUD (peptic ulcer disease)    Scleroderma ([place])    Past Surgical History:  Procedure Laterality Date   arthroscopy     CYST REMOVAL HAND     EYE SURGERY Left 10/25/2017   laser surgery    KNEE ARTHROPLASTY     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/2022   Pain due to onychomycosis of toenails of both feet 08/24/2022   Callus 08/24/2022   PAD (peripheral artery disease) ([place]) 04/22/2022   Aortic atherosclerosis ([place]) 04/15/2021   High risk medication use 04/09/2020   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   Atherosclerosis of coronary artery of native heart without angina pectoris 10/05/2016   Aortic stenosis 05/21/2016   Abnormal PFTs 05/21/2016   Asthma due to environmental allergies 06/24/2015   Positive PPD, treated 06/05/2015   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/14/2014   Scleroderma ([place]) 07/12/2013   Hypertension 03/04/2011   Hyperlipidemia 03/04/2011   Allergic rhinitis 03/04/2011   Arthritis 03/04/2011   Migraine variant 04/18/1995    REFERRING DIAG: M17.0 (ICD-10-CM) - Primary osteoarthritis of both knees   THERAPY DIAG:  Chronic pain of left knee  Difficulty in walking, not elsewhere classified  Chronic  pain of right knee  Rationale for Evaluation and Treatment Rehabilitation  SUBJECTIVE:    SUBJECTIVE STATEMENT: I almost cancelled, I stepped out of the car and landed wrong on my knee. It is swollen.  It is hurting since then.      PAIN:  Are you having pain? Yes: NPRS scale: 6 /10 Lt knee. Pain location:Rt Knee as well 3/10 Pain description: aching pressure  Aggravating factors: walking, stairs Relieving factors: rest, does not use ice/heat    Was a 10/10 on Tuesday 2/6, could not walk   PERTINENT HISTORY: Scleroderma, Morphea Bilateral arthroscopic knee surgery 13 yrs approx    PRECAUTIONS: None   WEIGHT BEARING RESTRICTIONS: No   FALLS:  Has patient fallen in last 6 months?  Can occasionally lose her balance   PATIENT GOALS: I want to be able to get up better improve walking and posture   OBJECTIVE: (objective measures completed at initial evaluation unless otherwise dated)   DIAGNOSTIC FINDINGS:    06/2022: Severe lateral compartment narrowing with lateral and intercondylar  osteophytes was noted.  Severe patellofemoral narrowing was noted.  No  chondrocalcinosis was noted.   Impression: These findings are consistent with severe osteoarthritis and  severe chondromalacia patella.      PATIENT SURVEYS:  FOTO 60% 11/03/22 FOTO 57%   COGNITION: Overall cognitive status: Within functional limits for tasks  assessed                         SENSATION: WFL  Toes numb but not sure if she has neuropathy     EDEMA:  NT   MUSCLE LENGTH: NT   POSTURE: rounded shoulders, forward head, increased thoracic kyphosis, flexed trunk , and genu valgus   PALPATION: Pain medial compartment and TTP throughout peri-patellar   LOWER EXTREMITY ROM:   Active ROM Right eval Left eval 11/03/22 Right 11/03/22 Left 11/08/22 Rt  Hip flexion         Hip extension         Hip abduction         Hip adduction         Hip internal rotation         Hip external rotation          Knee flexion 102 108 92 P! 110 98  Knee extension 8 11     Ankle dorsiflexion         Ankle plantarflexion         Ankle inversion         Ankle eversion          (Blank rows = not tested)   LOWER EXTREMITY MMT:   MMT Right eval Left eval Rt/Lt. 11/22/22  Hip flexion 4 4 4+/5, 4+/5  Hip extension       Hip abduction 3- 3-   Hip adduction       Hip internal rotation       Hip external rotation       Knee flexion 4+ 4+ 4+/5 pain, 4+/5   Knee extension 4+ 4+ 4+/5 pain, 4+/5  Ankle dorsiflexion 5 5   Ankle plantarflexion       Ankle inversion       Ankle eversion        (Blank rows = not tested)   LOWER EXTREMITY SPECIAL TESTS:  NT    FUNCTIONAL TESTS:  5 times sit to stand: 43 sec : 11/03/22: 30 sec   11/22/22: lacks hip extension, uses hands on thighs : 33 sec pain  30 seconds chair stand test _NT  Timed up and go (TUG): NT  2 minute walk test: EVAL: 288 feet   11/22/22: 312 feet    GAIT: Distance walked: 150 Assistive device utilized: None Level of assistance: Modified independence Comments: trunk flexed, genu valgus      TODAY'S TREATMENT:   [facility] Adult PT Treatment:                                                DATE: 11/24/22 Therapeutic Exercise: Quad set SAQ AAROM  Manual Therapy: STM to L thigh and peripatellar  Valgus/varus stress, ant and post drawer Modalities: Vaso 15 min light compression and coldest setting Self care: Discussed eval findings and care of acute knee pain, modifiy activity and HEP   [facility] Adult PT Treatment:                                                DATE: 11/22/22 Therapeutic Exercise: Nustep L4 UE and LE cues for avoiding collapse in knees  MMT Sit to stand  x 5 Supine hip abd/ER green band x 15 (vs seated at home) Bridge green x 15  Quad set Rt x 10  SLR  x 10  AAROM Rt knee  LAQ Rt x 15 2 min walk 312 feet, pain still 6/10 Hip abduction standing  x10 each side    [facility] Adult PT Treatment:                                                 DATE: 11/19/22 Therapeutic Exercise: Nustep L5 UE/LE x 5 minutes  Hamstring curl seated green 10 x 2 each Seated March 3# 10 x 2 each Seated LAQ 3# 10 x 2 each  Supine h/s curls, feet on ball 10 x 2  Green band clam x 10 - 90/90 feet on ball  Bridge with feet on ball  x 10    [facility] Adult PT Treatment:                                                DATE: 1--31-24 Aquatic therapy at [address] - therapeutic pool temp 92 degrees Pt enters building ambulating independently with slowed gait pattern and valgus at knees and no AD  Treatment took place in water 3.8 to  4 ft 8 in.feet deep depending upon activity.  Pt entered and exited the pool via stair and handrails with step to pattern and slow cadence. Pt with 4/10 at beginning of session.  Pain at end of session bil knees 2-3/10  Aquatic Exercise: Walking forward/  backward holding yellow barbell At edge of pool, pt performed LE exercise: Squats 2x 20 VC for ER of hips to prevent knee valgus Hip abd/add x20 BIL Hip ext/flex with knee straight x 20 BIL Hamstring curl x20 BIL Step ups on submerged step x10 Rt leading Heel raises x20 Runners Stretch x 30" x 2 BIL Hamstring stretch x 30" x 2 BIL  Aqua Stretch to RT  quad, assisted quad stretch by PT  Pt requires the buoyancy of water for active assisted exercises with buoyancy supported for strengthening and AROM exercises. Hydrostatic pressure also supports joints by unweighting joint load by at least 50 % in 3-4 feet depth water. 80% in chest to neck deep water. Water will provide assistance with movement using the current and laminar flow while the buoyancy reduces weight bearing. Pt requires the viscosity of the water for resistance with strengthening exercises.    [place] Adult PT Treatment:                                                DATE: 11-10-22 Aquatic therapy at [address] - therapeutic pool temp 91 degrees Pt enters building  ambulating independently with slowed gait pattern and valgus at knees and no AD  Treatment took place in water 3.8 to  4 ft 8 in.feet deep depending upon activity.  Pt entered and exited the pool via stair and handrails with step to pattern and slow cadence.   Therapeutic Exercise: Walking forward/ holding white barbell/side stepping  using yellow pool noodle and progressing to Rainbow DB At edge of pool, pt performed LE exercise: Hip abd/add x20 BIL Hip ext/flex with knee straight x 20 BIL Hamstring curl x20 BIL Squats 2x20 Step ups on submerged step x10 Rt leading Heel raises x20 Runners Stretch x 30" x 2 BIL Hamstring stretch x 30" x 2 BIL  Aqua Stretch to BIL quads, assisted quad stretch by PT  Pt requires the buoyancy of water for active assisted exercises with buoyancy supported for strengthening and AROM exercises. Hydrostatic pressure also supports joints by unweighting joint load by at least 50 % in 3-4 feet depth water. 80% in chest to neck deep water. Water will provide assistance with movement using the current and laminar flow while the buoyancy reduces weight bearing. Pt requires the viscosity of the water for resistance with strengthening exercises.   [facility] Adult PT Treatment:                                                DATE: 11/08/22 Therapeutic Exercise: Seated LAQ 3 lbs x 15  March x 10 each with 3 lbs  Wall for calf stretch x 3 each side  Supine hamstring stretch with strap, knee flexion AAROM with strap  Ball hamstring curls x 15  Ball bridge x 10   [facility] Adult PT Treatment:                                                DATE: 11/05/2022 Aquatic therapy at [address] - therapeutic pool temp 92 degrees Pt enters building ambulating independently with slowed gait pattern and valgus at knees.  Treatment took place in water 3.8 to  4 ft 8 in.feet deep depending upon activity.  Pt entered and exited the pool via stair and handrails with step to pattern  and slow cadence. Patient entered water for aquatic therapy for first time and was introduced to principles and therapeutic effects of water as they ambulated and acclimated to pool.  Therapeutic Exercise: Walking forward/ holding white barbell/side stepping At edge of pool, pt performed LE exercise: Hip abd/add x20 BIL Hip ext/flex with knee straight x 20 BIL Hamstring curl x20 BIL Squats 2x20 Step ups on submerged step x10 Rt leading Heel raises x20  Pt requires the buoyancy of water for active assisted exercises with buoyancy supported for strengthening and AROM exercises. Hydrostatic pressure also supports joints by unweighting joint load by at least 50 % in 3-4 feet depth water. 80% in chest to neck deep water. Water will provide assistance with movement using the current and laminar flow while the buoyancy reduces weight bearing. Pt requires the viscosity of the water for resistance with strengthening exercises.   [facility] Adult PT Treatment:                                                DATE: 11/03/22 Therapeutic Exercise: Nustep L3 x 5 minutes  Hamstring stretch 2 x 30 sec  LAQ x 25 each  Gastroc stretch at wall 2 x 20 sec SLR with QS x10 each  Heel slide AAROM.   Therapeutic Activity: 5 x STS FOTO 57 Squat at sink- -1/2 ROM to chair x 6 - too painful                                                                                                                           PATIENT EDUCATION:  Education details: modified HEP, ice, follow up with MD   Person educated: Patient Education method: Explanation, Demonstration, and Handouts Education comprehension: verbalized understanding, returned demonstration, and needs further education   HOME EXERCISE PROGRAM: Access Code: [email or web address] URL: [email or web address] Date: 10/21/2022 Prepared by: Gar Ponto  Exercises - Seated Hamstring Stretch  - 1 x daily - 7 x weekly - 1 sets - 3 reps - 30 hold - Gastroc Stretch  on Wall  - 1 x daily - 7 x weekly - 1 sets - 3 reps - 30 hold - Hip Flexion  - 1 x daily - 7 x weekly - 1 sets - 5 reps - 3 hold - Standing Hip Flexor Stretch  - 1 x daily - 7 x weekly - 1 sets - 3 reps - 30 hold - Active Straight Leg Raise with Quad Set  - 1 x daily - 7 x weekly - 1 sets - 5-10 reps - 3 hold - Bridge with Hip Abduction and Resistance  - 1 x daily - 7 x weekly - 1 sets - 5-10 reps - 3 hold - Hooklying Clamshell with Resistance  - 1 x daily - 7 x weekly - 1 sets - 10 reps - 3 hold - Sit to Stand with Counter Support  - 1 x daily - 7 x weekly - 2 sets - 5 reps - Seated Long Arc Quad  - 1 x daily - 7 x weekly - 2 sets - 10 reps - 3 hold   ASSESSMENT:   CLINICAL IMPRESSION: Patient with increased pain since Tuesday.  She has increased swelling compared to Rt knee. She plans to call her Rheumatologist today and follow up with her. She as encouraged to continue to work on bending it, walking as best she can and doing open chain knee exercises. She was initially unable to bear weight but is now able to walk. Cont POC as tolerated.   OBJECTIVE IMPAIRMENTS: Abnormal gait, decreased activity tolerance, decreased mobility, difficulty walking, decreased ROM, decreased strength, hypomobility, increased fascial restrictions, impaired flexibility, improper body mechanics, postural dysfunction, and pain.    ACTIVITY LIMITATIONS: carrying, lifting, bending, standing, squatting, stairs, transfers, locomotion level, and caring for others   PARTICIPATION LIMITATIONS: cleaning, laundry, shopping, and community activity   PERSONAL FACTORS: Age, Time since onset of injury/illness/exacerbation, and 1-2 comorbidities: scleroderma, previous knee scopes  are also affecting patient's functional outcome.     GOALS: Goals reviewed with patient? Yes   SHORT TERM GOALS: Target date: 11/05/2022     Pt will be I with HEP for LE strengthening  Baseline: Status: Ind Goal status: MET  2.  Pt will note  greater ease of sit to stand transfers due to improved knee mobility Baseline: Status: 10/21/22= Pt reports effort level for STS is the same Goal status:  ongoing     3.  Pt will be able to increase steps per day to 4000 Baseline: 2 [phone] steps  Status: 10/21/22= 4000-5000 per pt report Goal status: MET     LONG TERM GOALS: Target date: 11/19/2022     Pt will be I with final HEP  Baseline:  Goal status:ongoing    2.  FOTO score will increase to 70% or better  Baseline: 60% Goal status: ongoing    3.  Pt will be able to perform sit to stand x 5 (no UEs in < 30 sec)  Baseline: 43 sec with UE assist  Goal status: ongoing    4.  Pt will be able to complete 2 min walk test to 350 feet or more Baseline: 288 feet, 312 feet  Goal status: ongoing    5.  Pt will increase hip strength to 4/5 to improve gait stability and support knees  Baseline: 3-/5 Goal status: ongoing    PLAN:   PT FREQUENCY: 2x/week   PT DURATION: 8 weeks   PLANNED INTERVENTIONS: Therapeutic exercises, Therapeutic activity, Neuromuscular re-education, Balance training, Gait training, Patient/Family education, Self Care, Joint mobilization, Stair training, Aquatic Therapy, Cryotherapy, Moist heat, Taping, Manual therapy, and Re-evaluation   PLAN FOR NEXT SESSION: how is L knee? NuStep, hip strength as able, tolerated. progress ROM and strength .Aquatics , schedule 1 x water 1 x land per week .    Raeford Razor, PT 11/25/22 11:51 AM Phone: [phone] Fax: [phone]

## 2022-11-25 ENCOUNTER — Ambulatory Visit: Payer: Medicare Other | Admitting: Physical Therapy

## 2022-11-25 ENCOUNTER — Encounter: Payer: Self-pay | Admitting: Physical Therapy

## 2022-11-25 DIAGNOSIS — M25561 Pain in right knee: Secondary | ICD-10-CM | POA: Diagnosis not present

## 2022-11-25 DIAGNOSIS — G8929 Other chronic pain: Secondary | ICD-10-CM | POA: Diagnosis not present

## 2022-11-25 DIAGNOSIS — R262 Difficulty in walking, not elsewhere classified: Secondary | ICD-10-CM

## 2022-11-25 DIAGNOSIS — M25562 Pain in left knee: Secondary | ICD-10-CM | POA: Diagnosis not present

## 2022-11-29 ENCOUNTER — Telehealth: Payer: Self-pay | Admitting: *Deleted

## 2022-11-29 ENCOUNTER — Ambulatory Visit: Payer: Medicare Other | Admitting: Physical Therapy

## 2022-11-29 DIAGNOSIS — R262 Difficulty in walking, not elsewhere classified: Secondary | ICD-10-CM

## 2022-11-29 DIAGNOSIS — M170 Bilateral primary osteoarthritis of knee: Secondary | ICD-10-CM

## 2022-11-29 DIAGNOSIS — G8929 Other chronic pain: Secondary | ICD-10-CM | POA: Diagnosis not present

## 2022-11-29 DIAGNOSIS — M25561 Pain in right knee: Secondary | ICD-10-CM | POA: Diagnosis not present

## 2022-11-29 DIAGNOSIS — M25562 Pain in left knee: Secondary | ICD-10-CM | POA: Diagnosis not present

## 2022-11-29 NOTE — Telephone Encounter (Signed)
Patient contacted the office stating that she is currently in physical therapy for her knees. She states that Dr. Estanislado Buchanan has discussed she will possibly need knee a knee replacement. Patient would like to know who you would recommend and would like a referral. Please advise.

## 2022-11-29 NOTE — Telephone Encounter (Signed)
Referral placed and patient notified.

## 2022-11-29 NOTE — Telephone Encounter (Signed)
Okay to refer to Dr. Wynelle Link at [facility] or Dr. Ninfa Linden at [place].

## 2022-11-29 NOTE — Therapy (Signed)
OUTPATIENT PHYSICAL THERAPY TREATMENT NOTE   Patient Name: Rhonda Buchanan MRN: [id number] DOB:Jul 15, 1946, 77 y.o., female 23 Date: 11/29/2022  PCP: Denita Lung, MD  REFERRING PROVIDER: Estanislado Pandy, MD  END OF SESSION:   PT End of Session - 11/29/22 1408     Visit Number 15    Number of Visits 16    Date for PT Re-Evaluation 01/17/23    Authorization Type [employer] Medicare    PT Start Time 0204    PT Stop Time 0245    PT Time Calculation (min) 41 min                   Past Medical History:  Diagnosis Date   Allergy    Arthritis    Hypercholesterolemia    Hypertension    PUD (peptic ulcer disease)    Scleroderma ([place])    Past Surgical History:  Procedure Laterality Date   arthroscopy     CYST REMOVAL HAND     EYE SURGERY Left 10/25/2017   laser surgery    KNEE ARTHROPLASTY     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/2022   Pain due to onychomycosis of toenails of both feet 08/24/2022   Callus 08/24/2022   PAD (peripheral artery disease) ([place]) 04/22/2022   Aortic atherosclerosis ([place]) 04/15/2021   High risk medication use 04/09/2020   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   Atherosclerosis of coronary artery of native heart without angina pectoris 10/05/2016   Aortic stenosis 05/21/2016   Abnormal PFTs 05/21/2016   Asthma due to environmental allergies 06/24/2015   Positive PPD, treated 06/05/2015   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/14/2014   Scleroderma ([place]) 07/12/2013   Hypertension 03/04/2011   Hyperlipidemia 03/04/2011   Allergic rhinitis 03/04/2011   Arthritis 03/04/2011   Migraine variant 04/18/1995    REFERRING DIAG: M17.0 (ICD-10-CM) - Primary osteoarthritis of both knees   THERAPY DIAG:  Chronic pain of left knee  Difficulty in walking, not elsewhere classified  Chronic pain of right knee  Rationale for Evaluation and Treatment Rehabilitation  SUBJECTIVE:    SUBJECTIVE  STATEMENT:The left knee is worse than last time. Now if I get a sharp pain in it, it feels like it will give away.     PAIN:  Are you having pain? Yes: NPRS scale: 7-8 /10 Lt knee. Pain location:Rt Knee as well 3/10 Pain description: aching pressure  Aggravating factors: walking, stairs Relieving factors: rest, does not use ice/heat    Was a 10/10 on Tuesday 2/6, could not walk   PERTINENT HISTORY: Scleroderma, Morphea Bilateral arthroscopic knee surgery 13 yrs approx    PRECAUTIONS: None   WEIGHT BEARING RESTRICTIONS: No   FALLS:  Has patient fallen in last 6 months?  Can occasionally lose her balance   PATIENT GOALS: I want to be able to get up better improve walking and posture   OBJECTIVE: (objective measures completed at initial evaluation unless otherwise dated)   DIAGNOSTIC FINDINGS:    06/2022: Severe lateral compartment narrowing with lateral and intercondylar  osteophytes was noted.  Severe patellofemoral narrowing was noted.  No  chondrocalcinosis was noted.   Impression: These findings are consistent with severe osteoarthritis and  severe chondromalacia patella.      PATIENT SURVEYS:  FOTO 60% 11/03/22 FOTO 57%   COGNITION: Overall cognitive status: Within functional limits for tasks assessed  SENSATION: WFL  Toes numb but not sure if she has neuropathy     EDEMA:  NT   MUSCLE LENGTH: NT   POSTURE: rounded shoulders, forward head, increased thoracic kyphosis, flexed trunk , and genu valgus   PALPATION: Pain medial compartment and TTP throughout peri-patellar   LOWER EXTREMITY ROM:   Active ROM Right eval Left eval 11/03/22 Right 11/03/22 Left 11/08/22 Rt  Hip flexion         Hip extension         Hip abduction         Hip adduction         Hip internal rotation         Hip external rotation         Knee flexion 102 108 92 P! 110 98  Knee extension 8 11     Ankle dorsiflexion         Ankle plantarflexion          Ankle inversion         Ankle eversion          (Blank rows = not tested)   LOWER EXTREMITY MMT:   MMT Right eval Left eval Rt/Lt. 11/22/22  Hip flexion 4 4 4+/5, 4+/5  Hip extension       Hip abduction 3- 3-   Hip adduction       Hip internal rotation       Hip external rotation       Knee flexion 4+ 4+ 4+/5 pain, 4+/5   Knee extension 4+ 4+ 4+/5 pain, 4+/5  Ankle dorsiflexion 5 5   Ankle plantarflexion       Ankle inversion       Ankle eversion        (Blank rows = not tested)   LOWER EXTREMITY SPECIAL TESTS:  NT    FUNCTIONAL TESTS:  5 times sit to stand: 43 sec : 11/03/22: 30 sec   11/22/22: lacks hip extension, uses hands on thighs : 33 sec pain  30 seconds chair stand test _NT  Timed up and go (TUG): NT  2 minute walk test: EVAL: 288 feet   11/22/22: 312 feet    GAIT: Distance walked: 150 Assistive device utilized: None Level of assistance: Modified independence Comments: trunk flexed, genu valgus      TODAY'S TREATMENT:  [facility] Adult PT Treatment:                                                DATE: 11/29/22 Therapeutic Exercise: Nustep L5 UE/LE  Alternating heel slides seated  Supine feet on ball hamstring curls  Supine feet on ball, bridge Supine feet on ball, alternating SLR x 10   Therapeutic Activity: Gait training 200 ft with RW  to educate patient on benefit, decreased fall risk, decreased pain, Decreased limp    [facility] Adult PT Treatment:                                                DATE: 11/24/22 Therapeutic Exercise: Quad set SAQ AAROM  Manual Therapy: STM to L thigh and peripatellar  Valgus/varus stress, ant and post drawer Modalities: Vaso 15 min light compression and coldest setting  Self care: Discussed eval findings and care of acute knee pain, modifiy activity and HEP   [facility] Adult PT Treatment:                                                DATE: 11/22/22 Therapeutic Exercise: Nustep L4 UE and LE cues for avoiding collapse in knees   MMT Sit to stand  x 5 Supine hip abd/ER green band x 15 (vs seated at home) Bridge green x 15  Quad set Rt x 10  SLR  x 10  AAROM Rt knee  LAQ Rt x 15 2 min walk 312 feet, pain still 6/10 Hip abduction standing  x10 each side    [facility] Adult PT Treatment:                                                DATE: 11/19/22 Therapeutic Exercise: Nustep L5 UE/LE x 5 minutes  Hamstring curl seated green 10 x 2 each Seated March 3# 10 x 2 each Seated LAQ 3# 10 x 2 each  Supine h/s curls, feet on ball 10 x 2  Green band clam x 10 - 90/90 feet on ball  Bridge with feet on ball  x 10    [facility] Adult PT Treatment:                                                DATE: 1--31-24 Aquatic therapy at [address] - therapeutic pool temp 92 degrees Pt enters building ambulating independently with slowed gait pattern and valgus at knees and no AD  Treatment took place in water 3.8 to  4 ft 8 in.feet deep depending upon activity.  Pt entered and exited the pool via stair and handrails with step to pattern and slow cadence. Pt with 4/10 at beginning of session.  Pain at end of session bil knees 2-3/10  Aquatic Exercise: Walking forward/  backward holding yellow barbell At edge of pool, pt performed LE exercise: Squats 2x 20 VC for ER of hips to prevent knee valgus Hip abd/add x20 BIL Hip ext/flex with knee straight x 20 BIL Hamstring curl x20 BIL Step ups on submerged step x10 Rt leading Heel raises x20 Runners Stretch x 30" x 2 BIL Hamstring stretch x 30" x 2 BIL  Aqua Stretch to RT  quad, assisted quad stretch by PT  Pt requires the buoyancy of water for active assisted exercises with buoyancy supported for strengthening and AROM exercises. Hydrostatic pressure also supports joints by unweighting joint load by at least 50 % in 3-4 feet depth water. 80% in chest to neck deep water. Water will provide assistance with movement using the current and laminar flow while the buoyancy reduces  weight bearing. Pt requires the viscosity of the water for resistance with strengthening exercises.    [facility] Adult PT Treatment:  DATE: 11-10-22 Aquatic therapy at [address] - therapeutic pool temp 91 degrees Pt enters building ambulating independently with slowed gait pattern and valgus at knees and no AD  Treatment took place in water 3.8 to  4 ft 8 in.feet deep depending upon activity.  Pt entered and exited the pool via stair and handrails with step to pattern and slow cadence.   Therapeutic Exercise: Walking forward/ holding white barbell/side stepping using yellow pool noodle and progressing to Rainbow DB At edge of pool, pt performed LE exercise: Hip abd/add x20 BIL Hip ext/flex with knee straight x 20 BIL Hamstring curl x20 BIL Squats 2x20 Step ups on submerged step x10 Rt leading Heel raises x20 Runners Stretch x 30" x 2 BIL Hamstring stretch x 30" x 2 BIL  Aqua Stretch to BIL quads, assisted quad stretch by PT  Pt requires the buoyancy of water for active assisted exercises with buoyancy supported for strengthening and AROM exercises. Hydrostatic pressure also supports joints by unweighting joint load by at least 50 % in 3-4 feet depth water. 80% in chest to neck deep water. Water will provide assistance with movement using the current and laminar flow while the buoyancy reduces weight bearing. Pt requires the viscosity of the water for resistance with strengthening exercises.   [facility] Adult PT Treatment:                                                DATE: 11/08/22 Therapeutic Exercise: Seated LAQ 3 lbs x 15  March x 10 each with 3 lbs  Wall for calf stretch x 3 each side  Supine hamstring stretch with strap, knee flexion AAROM with strap  Ball hamstring curls x 15  Ball bridge x 10   [facility] Adult PT Treatment:                                                DATE: 11/05/2022 Aquatic therapy at [address] - therapeutic pool temp 92 degrees Pt enters building ambulating independently with slowed gait pattern and valgus at knees.  Treatment took place in water 3.8 to  4 ft 8 in.feet deep depending upon activity.  Pt entered and exited the pool via stair and handrails with step to pattern and slow cadence. Patient entered water for aquatic therapy for first time and was introduced to principles and therapeutic effects of water as they ambulated and acclimated to pool.  Therapeutic Exercise: Walking forward/ holding white barbell/side stepping At edge of pool, pt performed LE exercise: Hip abd/add x20 BIL Hip ext/flex with knee straight x 20 BIL Hamstring curl x20 BIL Squats 2x20 Step ups on submerged step x10 Rt leading Heel raises x20  Pt requires the buoyancy of water for active assisted exercises with buoyancy supported for strengthening and AROM exercises. Hydrostatic pressure also supports joints by unweighting joint load by at least 50 % in 3-4 feet depth water. 80% in chest to neck deep water. Water will provide assistance with movement using the current and laminar flow while the buoyancy reduces weight bearing. Pt requires the viscosity of the water for resistance with strengthening exercises.   [facility] Adult PT Treatment:  DATE: 11/03/22 Therapeutic Exercise: Nustep L3 x 5 minutes  Hamstring stretch 2 x 30 sec  LAQ x 25 each  Gastroc stretch at wall 2 x 20 sec SLR with QS x10 each  Heel slide AAROM.   Therapeutic Activity: 5 x STS FOTO 57 Squat at sink- -1/2 ROM to chair x 6 - too painful                                                                                                                           PATIENT EDUCATION:  Education details: modified HEP, ice, follow up with MD   Person educated: Patient Education method: Explanation, Demonstration, and Handouts Education comprehension: verbalized understanding,  returned demonstration, and needs further education   HOME EXERCISE PROGRAM: Access Code: [email or web address] URL: [email or web address] Date: 10/21/2022 Prepared by: Gar Ponto  Exercises - Seated Hamstring Stretch  - 1 x daily - 7 x weekly - 1 sets - 3 reps - 30 hold - Gastroc Stretch on Wall  - 1 x daily - 7 x weekly - 1 sets - 3 reps - 30 hold - Hip Flexion  - 1 x daily - 7 x weekly - 1 sets - 5 reps - 3 hold - Standing Hip Flexor Stretch  - 1 x daily - 7 x weekly - 1 sets - 3 reps - 30 hold - Active Straight Leg Raise with Quad Set  - 1 x daily - 7 x weekly - 1 sets - 5-10 reps - 3 hold - Bridge with Hip Abduction and Resistance  - 1 x daily - 7 x weekly - 1 sets - 5-10 reps - 3 hold - Hooklying Clamshell with Resistance  - 1 x daily - 7 x weekly - 1 sets - 10 reps - 3 hold - Sit to Stand with Counter Support  - 1 x daily - 7 x weekly - 2 sets - 5 reps - Seated Long Arc Quad  - 1 x daily - 7 x weekly - 2 sets - 10 reps - 3 hold   ASSESSMENT:   CLINICAL IMPRESSION: Patient still with increased left knee pain since last Tuesday.  She arrives with [facility] and significant limp slow , guarded ambulation. She reports new onset of buckling that occurs after sharp pain occurs in her knee. Continued with gentle LE therex as tolerated. Trial of RW in clinic to assist to off-setting weight from LLE and to improve gait pattern; much improved in clinic. Encouraged continued ice at home and activity modification : consider obtaining RW script from MD. Cont POC as tolerated.   OBJECTIVE IMPAIRMENTS: Abnormal gait, decreased activity tolerance, decreased mobility, difficulty walking, decreased ROM, decreased strength, hypomobility, increased fascial restrictions, impaired flexibility, improper body mechanics, postural dysfunction, and pain.    ACTIVITY LIMITATIONS: carrying, lifting, bending, standing, squatting, stairs, transfers, locomotion level, and caring for others   PARTICIPATION LIMITATIONS:  cleaning, laundry, shopping, and community activity   PERSONAL FACTORS: Age, Time since onset  of injury/illness/exacerbation, and 1-2 comorbidities: scleroderma, previous knee scopes  are also affecting patient's functional outcome.     GOALS: Goals reviewed with patient? Yes   SHORT TERM GOALS: Target date: 11/05/2022     Pt will be I with HEP for LE strengthening  Baseline: Status: Ind Goal status: MET   2.  Pt will note greater ease of sit to stand transfers due to improved knee mobility Baseline: Status: 10/21/22= Pt reports effort level for STS is the same Goal status:  ongoing     3.  Pt will be able to increase steps per day to 4000 Baseline: 2 [phone] steps  Status: 10/21/22= 4000-5000 per pt report Goal status: MET     LONG TERM GOALS: Target date: 11/19/2022     Pt will be I with final HEP  Baseline:  Goal status:ongoing    2.  FOTO score will increase to 70% or better  Baseline: 60% Goal status: ongoing    3.  Pt will be able to perform sit to stand x 5 (no UEs in < 30 sec)  Baseline: 43 sec with UE assist  Goal status: ongoing    4.  Pt will be able to complete 2 min walk test to 350 feet or more Baseline: 288 feet, 312 feet  Goal status: ongoing    5.  Pt will increase hip strength to 4/5 to improve gait stability and support knees  Baseline: 3-/5 Goal status: ongoing    PLAN:   PT FREQUENCY: 2x/week   PT DURATION: 8 weeks   PLANNED INTERVENTIONS: Therapeutic exercises, Therapeutic activity, Neuromuscular re-education, Balance training, Gait training, Patient/Family education, Self Care, Joint mobilization, Stair training, Aquatic Therapy, Cryotherapy, Moist heat, Taping, Manual therapy, and Re-evaluation   PLAN FOR NEXT SESSION: how is L knee? NuStep, hip strength as able, tolerated. progress ROM and strength .Aquatics , schedule 1 x water 1 x land per week .    Hessie Diener, PTA 11/29/22 3:37 PM Phone: [phone] Fax: [phone]

## 2022-11-29 NOTE — Addendum Note (Signed)
Addended by: Carole Binning on: 11/29/2022 03:05 PM   Modules accepted: Orders

## 2022-12-01 ENCOUNTER — Ambulatory Visit: Payer: Medicare Other | Admitting: Physical Therapy

## 2022-12-01 ENCOUNTER — Encounter: Payer: Self-pay | Admitting: Physical Therapy

## 2022-12-01 DIAGNOSIS — R262 Difficulty in walking, not elsewhere classified: Secondary | ICD-10-CM | POA: Diagnosis not present

## 2022-12-01 DIAGNOSIS — G8929 Other chronic pain: Secondary | ICD-10-CM

## 2022-12-01 DIAGNOSIS — M25561 Pain in right knee: Secondary | ICD-10-CM | POA: Diagnosis not present

## 2022-12-01 DIAGNOSIS — M25562 Pain in left knee: Secondary | ICD-10-CM | POA: Diagnosis not present

## 2022-12-01 NOTE — Therapy (Signed)
OUTPATIENT PHYSICAL THERAPY TREATMENT NOTE   Patient Name: Rhonda Buchanan MRN: [id number] DOB:06/17/1946, 77 y.o., female Today's Date: 12/01/2022  PCP: Denita Lung, MD  REFERRING PROVIDER: Estanislado Pandy, MD  END OF SESSION:   PT End of Session - 12/01/22 1507     Visit Number 16    Number of Visits 16    Date for PT Re-Evaluation 01/17/23    Authorization Type [employer] Medicare    PT Start Time [id number]    PT Stop Time [id number]    PT Time Calculation (min) 40 min    Activity Tolerance Patient tolerated treatment well;Patient limited by pain    Behavior During Therapy [facility] for tasks assessed/performed                   Past Medical History:  Diagnosis Date   Allergy    Arthritis    Hypercholesterolemia    Hypertension    PUD (peptic ulcer disease)    Scleroderma ([place])    Past Surgical History:  Procedure Laterality Date   arthroscopy     CYST REMOVAL HAND     EYE SURGERY Left 10/25/2017   laser surgery    KNEE ARTHROPLASTY     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/2022   Pain due to onychomycosis of toenails of both feet 08/24/2022   Callus 08/24/2022   PAD (peripheral artery disease) ([place]) 04/22/2022   Aortic atherosclerosis ([place]) 04/15/2021   High risk medication use 04/09/2020   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   Atherosclerosis of coronary artery of native heart without angina pectoris 10/05/2016   Aortic stenosis 05/21/2016   Abnormal PFTs 05/21/2016   Asthma due to environmental allergies 06/24/2015   Positive PPD, treated 06/05/2015   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/14/2014   Scleroderma ([place]) 07/12/2013   Hypertension 03/04/2011   Hyperlipidemia 03/04/2011   Allergic rhinitis 03/04/2011   Arthritis 03/04/2011   Migraine variant 04/18/1995    REFERRING DIAG: M17.0 (ICD-10-CM) - Primary osteoarthritis of both knees   THERAPY DIAG:  Chronic pain of left knee  Difficulty in walking, not elsewhere  classified  Chronic pain of right knee  Rationale for Evaluation and Treatment Rehabilitation  SUBJECTIVE:    SUBJECTIVE STATEMENT:The left knee is worse than last time. Now if I get a sharp pain in it, it feels like it will give away.     PAIN:  Are you having pain? Yes: NPRS scale: 3-4/10 Lt knee. 5-6/10 Rt knee  Pain location:knees.  Also Rt ankle/shin painful  Pain description: aching pressure  Aggravating factors: walking, stairs Relieving factors: rest, does not use ice/heat    Was a 10/10 on Tuesday 2/6, could not walk   PERTINENT HISTORY: Scleroderma, Morphea Bilateral arthroscopic knee surgery 13 yrs approx    PRECAUTIONS: None   WEIGHT BEARING RESTRICTIONS: No   FALLS:  Has patient fallen in last 6 months?  Can occasionally lose her balance   PATIENT GOALS: I want to be able to get up better improve walking and posture   OBJECTIVE: (objective measures completed at initial evaluation unless otherwise dated)   DIAGNOSTIC FINDINGS:    06/2022: Rt and Lt. LE  Severe lateral compartment narrowing with lateral and intercondylar  osteophytes was noted.  Severe patellofemoral narrowing was noted.  No  chondrocalcinosis was noted.   Impression: These findings are consistent with severe osteoarthritis and  severe chondromalacia patella.      PATIENT SURVEYS:  FOTO  60% 11/03/22 FOTO 57%   COGNITION: Overall cognitive status: Within functional limits for tasks assessed                         SENSATION: WFL  Toes numb but not sure if she has neuropathy     EDEMA:  NT   MUSCLE LENGTH: NT    POSTURE: rounded shoulders, forward head, increased thoracic kyphosis, flexed trunk , and genu valgus   PALPATION: Pain medial compartment and TTP throughout peri-patellar   LOWER EXTREMITY ROM:   Active ROM Right eval Left eval 11/03/22 Right 11/03/22 Left 11/08/22 Rt  Hip flexion         Hip extension         Hip abduction         Hip adduction         Hip  internal rotation         Hip external rotation         Knee flexion 102 108 92 P! 110 98  Knee extension 8 11     Ankle dorsiflexion         Ankle plantarflexion         Ankle inversion         Ankle eversion          (Blank rows = not tested)   LOWER EXTREMITY MMT:   MMT Right eval Left eval Rt/Lt. 11/22/22  Hip flexion 4 4 4+/5, 4+/5  Hip extension       Hip abduction 3- 3-   Hip adduction       Hip internal rotation       Hip external rotation       Knee flexion 4+ 4+ 4+/5 pain, 4+/5   Knee extension 4+ 4+ 4+/5 pain, 4+/5  Ankle dorsiflexion 5 5   Ankle plantarflexion       Ankle inversion       Ankle eversion        (Blank rows = not tested)   LOWER EXTREMITY SPECIAL TESTS:  NT    FUNCTIONAL TESTS:  5 times sit to stand: 43 sec : 11/03/22: 30 sec   11/22/22: lacks hip extension, uses hands on thighs : 33 sec pain  30 seconds chair stand test _NT  Timed up and go (TUG): NT  2 minute walk test: EVAL: 288 feet   11/22/22: 312 feet    GAIT: Distance walked: 150 Assistive device utilized: None Level of assistance: Modified independence Comments: trunk flexed, genu valgus      TODAY'S TREATMENT:    [facility] Adult PT Treatment:                                                DATE: 12/01/22 Therapeutic Exercise: Nustep LE and UE 7 min L4  LAQ and hamstring curl seated green band with PT assist Seated clam green x 15  Bridge legs on bolster (glute) x 15 Hamstring bridge on bolster x 15  Hamstring stretch 30 sec x 2 each LE   Self care: Use of cane , surgical information, aftercare, Cubi    [facility] Adult PT Treatment:  DATE: 11/29/22 Therapeutic Exercise: Nustep L5 UE/LE  Alternating heel slides seated  Supine feet on ball hamstring curls  Supine feet on ball, bridge Supine feet on ball, alternating SLR x 10   Therapeutic Activity: Gait training 200 ft with RW  to educate patient on benefit, decreased fall risk,  decreased pain, Decreased limp    [facility] Adult PT Treatment:                                                DATE: 11/24/22 Therapeutic Exercise: Quad set SAQ AAROM  Manual Therapy: STM to L thigh and peripatellar  Valgus/varus stress, ant and post drawer Modalities: Vaso 15 min light compression and coldest setting Self care: Discussed eval findings and care of acute knee pain, modifiy activity and HEP   [facility] Adult PT Treatment:                                                DATE: 11/22/22 Therapeutic Exercise: Nustep L4 UE and LE cues for avoiding collapse in knees  MMT Sit to stand  x 5 Supine hip abd/ER green band x 15 (vs seated at home) Bridge green x 15  Quad set Rt x 10  SLR  x 10  AAROM Rt knee  LAQ Rt x 15 2 min walk 312 feet, pain still 6/10 Hip abduction standing  x10 each side    [facility] Adult PT Treatment:                                                DATE: 11/19/22 Therapeutic Exercise: Nustep L5 UE/LE x 5 minutes  Hamstring curl seated green 10 x 2 each Seated March 3# 10 x 2 each Seated LAQ 3# 10 x 2 each  Supine h/s curls, feet on ball 10 x 2  Green band clam x 10 - 90/90 feet on ball  Bridge with feet on ball  x 10    [facility] Adult PT Treatment:                                                DATE: 1--31-24 Aquatic therapy at [address] - therapeutic pool temp 92 degrees Pt enters building ambulating independently with slowed gait pattern and valgus at knees and no AD  Treatment took place in water 3.8 to  4 ft 8 in.feet deep depending upon activity.  Pt entered and exited the pool via stair and handrails with step to pattern and slow cadence. Pt with 4/10 at beginning of session.  Pain at end of session bil knees 2-3/10  Aquatic Exercise: Walking forward/  backward holding yellow barbell At edge of pool, pt performed LE exercise: Squats 2x 20 VC for ER of hips to prevent knee valgus Hip abd/add x20 BIL Hip ext/flex with knee straight x 20  BIL Hamstring curl x20 BIL Step ups on submerged step x10 Rt leading Heel raises x20 Runners Stretch x 30"  x 2 BIL Hamstring stretch x 30" x 2 BIL  Aqua Stretch to RT  quad, assisted quad stretch by PT  Pt requires the buoyancy of water for active assisted exercises with buoyancy supported for strengthening and AROM exercises. Hydrostatic pressure also supports joints by unweighting joint load by at least 50 % in 3-4 feet depth water. 80% in chest to neck deep water. Water will provide assistance with movement using the current and laminar flow while the buoyancy reduces weight bearing. Pt requires the viscosity of the water for resistance with strengthening exercises.    [facility] Adult PT Treatment:                                                DATE: 11-10-22 Aquatic therapy at [address] - therapeutic pool temp 91 degrees Pt enters building ambulating independently with slowed gait pattern and valgus at knees and no AD  Treatment took place in water 3.8 to  4 ft 8 in.feet deep depending upon activity.  Pt entered and exited the pool via stair and handrails with step to pattern and slow cadence.   Therapeutic Exercise: Walking forward/ holding white barbell/side stepping using yellow pool noodle and progressing to Rainbow DB At edge of pool, pt performed LE exercise: Hip abd/add x20 BIL Hip ext/flex with knee straight x 20 BIL Hamstring curl x20 BIL Squats 2x20 Step ups on submerged step x10 Rt leading Heel raises x20 Runners Stretch x 30" x 2 BIL Hamstring stretch x 30" x 2 BIL  Aqua Stretch to BIL quads, assisted quad stretch by PT  Pt requires the buoyancy of water for active assisted exercises with buoyancy supported for strengthening and AROM exercises. Hydrostatic pressure also supports joints by unweighting joint load by at least 50 % in 3-4 feet depth water. 80% in chest to neck deep water. Water will provide assistance with movement using the current and  laminar flow while the buoyancy reduces weight bearing. Pt requires the viscosity of the water for resistance with strengthening exercises.   [facility] Adult PT Treatment:                                                DATE: 11/08/22 Therapeutic Exercise: Seated LAQ 3 lbs x 15  March x 10 each with 3 lbs  Wall for calf stretch x 3 each side  Supine hamstring stretch with strap, knee flexion AAROM with strap  Ball hamstring curls x 15  Ball bridge x 10   [facility] Adult PT Treatment:                                                DATE: 11/05/2022 Aquatic therapy at [address] - therapeutic pool temp 92 degrees Pt enters building ambulating independently with slowed gait pattern and valgus at knees.  Treatment took place in water 3.8 to  4 ft 8 in.feet deep depending upon activity.  Pt entered and exited the pool via stair and handrails with step to pattern and slow cadence. Patient entered water for aquatic therapy for first  time and was introduced to principles and therapeutic effects of water as they ambulated and acclimated to pool.  Therapeutic Exercise: Walking forward/ holding white barbell/side stepping At edge of pool, pt performed LE exercise: Hip abd/add x20 BIL Hip ext/flex with knee straight x 20 BIL Hamstring curl x20 BIL Squats 2x20 Step ups on submerged step x10 Rt leading Heel raises x20  Pt requires the buoyancy of water for active assisted exercises with buoyancy supported for strengthening and AROM exercises. Hydrostatic pressure also supports joints by unweighting joint load by at least 50 % in 3-4 feet depth water. 80% in chest to neck deep water. Water will provide assistance with movement using the current and laminar flow while the buoyancy reduces weight bearing. Pt requires the viscosity of the water for resistance with strengthening exercises.   [facility] Adult PT Treatment:                                                DATE: 11/03/22 Therapeutic  Exercise: Nustep L3 x 5 minutes  Hamstring stretch 2 x 30 sec  LAQ x 25 each  Gastroc stretch at wall 2 x 20 sec SLR with QS x10 each  Heel slide AAROM.   Therapeutic Activity: 5 x STS FOTO 57 Squat at sink- -1/2 ROM to chair x 6 - too painful                                                                                                                           PATIENT EDUCATION:  Education details: modified HEP, ice, follow up with MD   Person educated: Patient Education method: Explanation, Demonstration, and Handouts Education comprehension: verbalized understanding, returned demonstration, and needs further education   HOME EXERCISE PROGRAM: Access Code: [email or web address] URL: [email or web address] Date: 10/21/2022 Prepared by: Gar Ponto  Exercises - Seated Hamstring Stretch  - 1 x daily - 7 x weekly - 1 sets - 3 reps - 30 hold - Gastroc Stretch on Wall  - 1 x daily - 7 x weekly - 1 sets - 3 reps - 30 hold - Hip Flexion  - 1 x daily - 7 x weekly - 1 sets - 5 reps - 3 hold - Standing Hip Flexor Stretch  - 1 x daily - 7 x weekly - 1 sets - 3 reps - 30 hold - Active Straight Leg Raise with Quad Set  - 1 x daily - 7 x weekly - 1 sets - 5-10 reps - 3 hold - Bridge with Hip Abduction and Resistance  - 1 x daily - 7 x weekly - 1 sets - 5-10 reps - 3 hold - Hooklying Clamshell with Resistance  - 1 x daily - 7 x weekly - 1 sets - 10 reps - 3 hold - Sit to Stand with Counter Support  - 1 x  daily - 7 x weekly - 2 sets - 5 reps - Seated Long Arc Quad  - 1 x daily - 7 x weekly - 2 sets - 10 reps - 3 hold   ASSESSMENT:   CLINICAL IMPRESSION: Patient still with increased left knee pain since last Tuesday.  She arrives with [facility] and significant limp slow , guarded ambulation. She reports new onset of buckling that occurs after sharp pain occurs in her knee. Continued with gentle LE therex as tolerated. Trial of RW in clinic to assist to off-setting weight from LLE and to improve  gait pattern; much improved in clinic. Encouraged continued ice at home and activity modification : consider obtaining RW script from MD. Cont POC as tolerated.   OBJECTIVE IMPAIRMENTS: Abnormal gait, decreased activity tolerance, decreased mobility, difficulty walking, decreased ROM, decreased strength, hypomobility, increased fascial restrictions, impaired flexibility, improper body mechanics, postural dysfunction, and pain.    ACTIVITY LIMITATIONS: carrying, lifting, bending, standing, squatting, stairs, transfers, locomotion level, and caring for others   PARTICIPATION LIMITATIONS: cleaning, laundry, shopping, and community activity   PERSONAL FACTORS: Age, Time since onset of injury/illness/exacerbation, and 1-2 comorbidities: scleroderma, previous knee scopes  are also affecting patient's functional outcome.     GOALS: Goals reviewed with patient? Yes   SHORT TERM GOALS: Target date: 11/05/2022     Pt will be I with HEP for LE strengthening  Baseline: Status: Ind Goal status: MET   2.  Pt will note greater ease of sit to stand transfers due to improved knee mobility Baseline: Status: 10/21/22= Pt reports effort level for STS is the same Goal status:  ongoing     3.  Pt will be able to increase steps per day to 4000 Baseline: 2 [phone] steps  Status: 10/21/22= 4000-5000 per pt report Goal status: MET     LONG TERM GOALS: Target date: 11/19/2022     Pt will be I with final HEP  Baseline:  Goal status:ongoing    2.  FOTO score will increase to 70% or better  Baseline: 60% Goal status: ongoing    3.  Pt will be able to perform sit to stand x 5 (no UEs in < 30 sec)  Baseline: 43 sec with UE assist  Goal status: ongoing    4.  Pt will be able to complete 2 min walk test to 350 feet or more Baseline: 288 feet, 312 feet  Goal status: ongoing    5.  Pt will increase hip strength to 4/5 to improve gait stability and support knees  Baseline: 3-/5 Goal status: ongoing     PLAN:   PT FREQUENCY: 2x/week   PT DURATION: 8 weeks   PLANNED INTERVENTIONS: Therapeutic exercises, Therapeutic activity, Neuromuscular re-education, Balance training, Gait training, Patient/Family education, Self Care, Joint mobilization, Stair training, Aquatic Therapy, Cryotherapy, Moist heat, Taping, Manual therapy, and Re-evaluation   PLAN FOR NEXT SESSION: how is L knee? NuStep, hip strength as able, tolerated. progress ROM and strength .Aquatics , schedule 1 x water 1 x land per week .    Hessie Diener, PTA 12/01/22 3:58 PM Phone: [phone] Fax: [phone]

## 2022-12-05 NOTE — Therapy (Unsigned)
OUTPATIENT PHYSICAL THERAPY TREATMENT NOTE   Patient Name: Rhonda Buchanan MRN: [id number] DOB:[birth year], 77 y.o., female Today's Date: 12/06/2022  PCP: Denita Lung, MD  REFERRING PROVIDER: Estanislado Pandy, MD  END OF SESSION:   PT End of Session - 12/06/22 1352     Visit Number 17    Date for PT Re-Evaluation 01/17/23    Authorization Type [employer] Medicare    PT Start Time 1345   15 min late   PT Stop Time 48    PT Time Calculation (min) 30 min    Activity Tolerance Patient tolerated treatment well    Behavior During Therapy WFL for tasks assessed/performed                    Past Medical History:  Diagnosis Date   Allergy    Arthritis    Hypercholesterolemia    Hypertension    PUD (peptic ulcer disease)    Scleroderma ([place])    Past Surgical History:  Procedure Laterality Date   arthroscopy     CYST REMOVAL HAND     EYE SURGERY Left 10/25/2017   laser surgery    KNEE ARTHROPLASTY     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/2022   Pain due to onychomycosis of toenails of both feet 08/24/2022   Callus 08/24/2022   PAD (peripheral artery disease) ([place]) 04/22/2022   Aortic atherosclerosis ([place]) 04/15/2021   High risk medication use 04/09/2020   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   Atherosclerosis of coronary artery of native heart without angina pectoris 10/05/2016   Aortic stenosis 05/21/2016   Abnormal PFTs 05/21/2016   Asthma due to environmental allergies 06/24/2015   Positive PPD, treated 06/05/2015   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/14/2014   Scleroderma ([place]) 07/12/2013   Hypertension 03/04/2011   Hyperlipidemia 03/04/2011   Allergic rhinitis 03/04/2011   Arthritis 03/04/2011   Migraine variant 04/18/1995    REFERRING DIAG: M17.0 (ICD-10-CM) - Primary osteoarthritis of both knees   THERAPY DIAG:  Chronic pain of left knee  Difficulty in walking, not elsewhere classified  Chronic pain of  right knee  Rationale for Evaluation and Treatment Rehabilitation  SUBJECTIVE:    SUBJECTIVE STATEMENT: Its ok right now.  I lost track of time (arr late).    PAIN:  Are you having pain? NOT TODAY! 12/06/22  Yes: NPRS scale: 3-4/10 Lt knee. 5-6/10 Rt knee  Pain location:knees.  Also Rt ankle/shin painful  Pain description: aching pressure  Aggravating factors: walking, stairs Relieving factors: rest, does not use ice/heat    Was a 10/10 on Tuesday 2/6, could not walk   PERTINENT HISTORY: Scleroderma, Morphea Bilateral arthroscopic knee surgery 13 yrs approx    PRECAUTIONS: None   WEIGHT BEARING RESTRICTIONS: No   FALLS:  Has patient fallen in last 6 months?  Can occasionally lose her balance   PATIENT GOALS: I want to be able to get up better improve walking and posture   OBJECTIVE: (objective measures completed at initial evaluation unless otherwise dated)   DIAGNOSTIC FINDINGS:    06/2022: Rt and Lt. LE  Severe lateral compartment narrowing with lateral and intercondylar  osteophytes was noted.  Severe patellofemoral narrowing was noted.  No  chondrocalcinosis was noted.   Impression: These findings are consistent with severe osteoarthritis and  severe chondromalacia patella.      PATIENT SURVEYS:  FOTO 60% 11/03/22 FOTO 57%   COGNITION: Overall cognitive status: Within functional limits  for tasks assessed                         SENSATION: WFL  Toes numb but not sure if she has neuropathy     EDEMA:  NT   MUSCLE LENGTH: NT    POSTURE: rounded shoulders, forward head, increased thoracic kyphosis, flexed trunk , and genu valgus   PALPATION: Pain medial compartment and TTP throughout peri-patellar   LOWER EXTREMITY ROM:   Active ROM Right eval Left eval 11/03/22 Right 11/03/22 Left 11/08/22 Rt  Hip flexion         Hip extension         Hip abduction         Hip adduction         Hip internal rotation         Hip external rotation          Knee flexion 102 108 92 P! 110 98  Knee extension 8 11     Ankle dorsiflexion         Ankle plantarflexion         Ankle inversion         Ankle eversion          (Blank rows = not tested)   LOWER EXTREMITY MMT:   MMT Right eval Left eval Rt/Lt. 11/22/22  Hip flexion 4 4 4+/5, 4+/5  Hip extension       Hip abduction 3- 3-   Hip adduction       Hip internal rotation       Hip external rotation       Knee flexion 4+ 4+ 4+/5 pain, 4+/5   Knee extension 4+ 4+ 4+/5 pain, 4+/5  Ankle dorsiflexion 5 5   Ankle plantarflexion       Ankle inversion       Ankle eversion        (Blank rows = not tested)   LOWER EXTREMITY SPECIAL TESTS:  NT    FUNCTIONAL TESTS:  5 times sit to stand: 43 sec : 11/03/22: 30 sec   11/22/22: lacks hip extension, uses hands on thighs : 33 sec pain  30 seconds chair stand test _NT  Timed up and go (TUG): NT  2 minute walk test: EVAL: 288 feet   11/22/22: 312 feet    GAIT: Distance walked: 150 Assistive device utilized: None Level of assistance: Modified independence Comments: trunk flexed, genu valgus      TODAY'S TREATMENT:    [facility] Adult PT Treatment:                                                DATE: 12/06/22 Therapeutic Exercise: Parallel bars for standing ther ex: retrogait x 4  Lateral band walking red band x 4 x 10 feet Lateral walking without band in mini squat 2 x 10 feet  High knee march slow x 10 Step up for glutes 4 inch x 10 each  Hip abduction x 15 edge of step  Sit to stand with red band x 10 added Airex to increase chair hgt. X 10  Hinge bilateral LEs x 15    [facility] Adult PT Treatment:  DATE: 12/01/22 Therapeutic Exercise: Nustep LE and UE 7 min L4  LAQ and hamstring curl seated green band with PT assist Seated clam green x 15  Bridge legs on bolster (glute) x 15 Hamstring bridge on bolster x 15  Hamstring stretch 30 sec x 2 each LE   Self care: Use of cane , surgical  information, aftercare, Cubi    [facility] Adult PT Treatment:                                                DATE: 11/29/22 Therapeutic Exercise: Nustep L5 UE/LE  Alternating heel slides seated  Supine feet on ball hamstring curls  Supine feet on ball, bridge Supine feet on ball, alternating SLR x 10   Therapeutic Activity: Gait training 200 ft with RW  to educate patient on benefit, decreased fall risk, decreased pain, Decreased limp    [facility] Adult PT Treatment:                                                DATE: 11/24/22 Therapeutic Exercise: Quad set SAQ AAROM  Manual Therapy: STM to L thigh and peripatellar  Valgus/varus stress, ant and post drawer Modalities: Vaso 15 min light compression and coldest setting Self care: Discussed eval findings and care of acute knee pain, modifiy activity and HEP   [facility] Adult PT Treatment:                                                DATE: 11/22/22 Therapeutic Exercise: Nustep L4 UE and LE cues for avoiding collapse in knees  MMT Sit to stand  x 5 Supine hip abd/ER green band x 15 (vs seated at home) Bridge green x 15  Quad set Rt x 10  SLR  x 10  AAROM Rt knee  LAQ Rt x 15 2 min walk 312 feet, pain still 6/10 Hip abduction standing  x10 each side    [facility] Adult PT Treatment:                                                DATE: 11/19/22 Therapeutic Exercise: Nustep L5 UE/LE x 5 minutes  Hamstring curl seated green 10 x 2 each Seated March 3# 10 x 2 each Seated LAQ 3# 10 x 2 each  Supine h/s curls, feet on ball 10 x 2  Green band clam x 10 - 90/90 feet on ball  Bridge with feet on ball  x 10  PATIENT EDUCATION:  Education details: modified HEP, ice, follow up with MD   Person educated: Patient Education method: Explanation, Demonstration, and Handouts Education comprehension: verbalized understanding, returned  demonstration, and needs further education   HOME EXERCISE PROGRAM: Access Code: [email or web address] URL: [email or web address] Date: 10/21/2022 Prepared by: Gar Ponto  Exercises - Seated Hamstring Stretch  - 1 x daily - 7 x weekly - 1 sets - 3 reps - 30 hold - Gastroc Stretch on Wall  - 1 x daily - 7 x weekly - 1 sets - 3 reps - 30 hold - Hip Flexion  - 1 x daily - 7 x weekly - 1 sets - 5 reps - 3 hold - Standing Hip Flexor Stretch  - 1 x daily - 7 x weekly - 1 sets - 3 reps - 30 hold - Active Straight Leg Raise with Quad Set  - 1 x daily - 7 x weekly - 1 sets - 5-10 reps - 3 hold - Bridge with Hip Abduction and Resistance  - 1 x daily - 7 x weekly - 1 sets - 5-10 reps - 3 hold - Hooklying Clamshell with Resistance  - 1 x daily - 7 x weekly - 1 sets - 10 reps - 3 hold - Sit to Stand with Counter Support  - 1 x daily - 7 x weekly - 2 sets - 5 reps - Seated Long Arc Quad  - 1 x daily - 7 x weekly - 2 sets - 10 reps - 3 hold   ASSESSMENT:   CLINICAL IMPRESSION: Session shortened due to late arrival.  Worked in standing for entire session with caution to avoid/minimize knee pain.  Unable to perform sit to stand without UEs from chair.  Heavy use of UEs needed but hinge was effective for working gluteals.  Independently rode NuStep  for 10 minutes,  cont POC with return to pool later this week.    OBJECTIVE IMPAIRMENTS: Abnormal gait, decreased activity tolerance, decreased mobility, difficulty walking, decreased ROM, decreased strength, hypomobility, increased fascial restrictions, impaired flexibility, improper body mechanics, postural dysfunction, and pain.    ACTIVITY LIMITATIONS: carrying, lifting, bending, standing, squatting, stairs, transfers, locomotion level, and caring for others   PARTICIPATION LIMITATIONS: cleaning, laundry, shopping, and community activity   PERSONAL FACTORS: Age, Time since onset of injury/illness/exacerbation, and 1-2 comorbidities: scleroderma,  previous knee scopes  are also affecting patient's functional outcome.     GOALS: Goals reviewed with patient? Yes   SHORT TERM GOALS: Target date: 11/05/2022     Pt will be I with HEP for LE strengthening  Baseline: Status: Ind Goal status: MET   2.  Pt will note greater ease of sit to stand transfers due to improved knee mobility Baseline: Status: 10/21/22= Pt reports effort level for STS is the same Goal status:  ongoing     3.  Pt will be able to increase steps per day to 4000 Baseline: 2 [phone] steps  Status: 10/21/22= 4000-5000 per pt report Goal status: MET     LONG TERM GOALS: Target date: 11/19/2022     Pt will be I with final HEP  Baseline:  Goal status:ongoing    2.  FOTO score will increase to 70% or better  Baseline: 60% Goal status: ongoing    3.  Pt will be able to perform sit to stand x 5 (no UEs in < 30 sec)  Baseline: 43 sec with UE assist  Goal status: ongoing    4.  Pt  will be able to complete 2 min walk test to 350 feet or more Baseline: 288 feet, 312 feet  Goal status: ongoing    5.  Pt will increase hip strength to 4/5 to improve gait stability and support knees  Baseline: 3-/5 Goal status: ongoing    PLAN:    PT FREQUENCY: 2x/week   PT DURATION: 8 weeks   PLANNED INTERVENTIONS: Therapeutic exercises, Therapeutic activity, Neuromuscular re-education, Balance training, Gait training, Patient/Family education, Self Care, Joint mobilization, Stair training, Aquatic Therapy, Cryotherapy, Moist heat, Taping, Manual therapy, and Re-evaluation   PLAN FOR NEXT SESSION: how is L knee? NuStep, hip strength as able, tolerated. progress ROM and strength .Aquatics , schedule 1 x water 1 x land per week .    Hessie Diener, PTA 12/06/22 2:33 PM Phone: [phone] Fax: [phone]

## 2022-12-06 ENCOUNTER — Ambulatory Visit: Payer: Medicare Other | Admitting: Physical Therapy

## 2022-12-06 ENCOUNTER — Encounter: Payer: Self-pay | Admitting: Physical Therapy

## 2022-12-06 DIAGNOSIS — G8929 Other chronic pain: Secondary | ICD-10-CM | POA: Diagnosis not present

## 2022-12-06 DIAGNOSIS — R262 Difficulty in walking, not elsewhere classified: Secondary | ICD-10-CM | POA: Diagnosis not present

## 2022-12-06 DIAGNOSIS — M25561 Pain in right knee: Secondary | ICD-10-CM | POA: Diagnosis not present

## 2022-12-06 DIAGNOSIS — M25562 Pain in left knee: Secondary | ICD-10-CM | POA: Diagnosis not present

## 2022-12-10 ENCOUNTER — Ambulatory Visit: Payer: Medicare Other

## 2022-12-10 DIAGNOSIS — R262 Difficulty in walking, not elsewhere classified: Secondary | ICD-10-CM

## 2022-12-10 DIAGNOSIS — M25561 Pain in right knee: Secondary | ICD-10-CM | POA: Diagnosis not present

## 2022-12-10 DIAGNOSIS — M25562 Pain in left knee: Secondary | ICD-10-CM | POA: Diagnosis not present

## 2022-12-10 DIAGNOSIS — G8929 Other chronic pain: Secondary | ICD-10-CM | POA: Diagnosis not present

## 2022-12-10 NOTE — Therapy (Signed)
OUTPATIENT PHYSICAL THERAPY TREATMENT NOTE   Patient Name: Rhonda Buchanan MRN: [id number] DOB:May 10, 1946, 77 y.o., female 86 Date: 12/10/2022  PCP: Denita Lung, MD  REFERRING PROVIDER: Estanislado Pandy, MD  END OF SESSION:   PT End of Session - 12/10/22 1509     Visit Number 18    Date for PT Re-Evaluation 01/17/23    Authorization Type UHC Medicare    PT Start Time [id number]    PT Stop Time [id number]    PT Time Calculation (min) 45 min    Activity Tolerance Patient tolerated treatment well    Behavior During Therapy WFL for tasks assessed/performed               Past Medical History:  Diagnosis Date   Allergy    Arthritis    Hypercholesterolemia    Hypertension    PUD (peptic ulcer disease)    Scleroderma ([place])    Past Surgical History:  Procedure Laterality Date   arthroscopy     CYST REMOVAL HAND     EYE SURGERY Left 10/25/2017   laser surgery    KNEE ARTHROPLASTY     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/2022   Pain due to onychomycosis of toenails of both feet 08/24/2022   Callus 08/24/2022   PAD (peripheral artery disease) ([place]) 04/22/2022   Aortic atherosclerosis ([place]) 04/15/2021   High risk medication use 04/09/2020   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   Atherosclerosis of coronary artery of native heart without angina pectoris 10/05/2016   Aortic stenosis 05/21/2016   Abnormal PFTs 05/21/2016   Asthma due to environmental allergies 06/24/2015   Positive PPD, treated 06/05/2015   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/14/2014   Scleroderma ([place]) 07/12/2013   Hypertension 03/04/2011   Hyperlipidemia 03/04/2011   Allergic rhinitis 03/04/2011   Arthritis 03/04/2011   Migraine variant 04/18/1995    REFERRING DIAG: M17.0 (ICD-10-CM) - Primary osteoarthritis of both knees   THERAPY DIAG:  Chronic pain of left knee  Difficulty in walking, not elsewhere classified  Chronic pain of right knee  Rationale  for Evaluation and Treatment Rehabilitation  SUBJECTIVE:    SUBJECTIVE STATEMENT: Patient reports that her knee pain is feeling better today, only at a 2/10. She states that she sees a Network engineer next week, is very nervous about needing knee replacements.    PAIN:  Are you having pain? NOT TODAY! 12/06/22  Yes: NPRS scale: 2/10 Lt knee. 5-6/10 Rt knee  Pain location:knees.  Also Rt ankle/shin painful  Pain description: aching pressure  Aggravating factors: walking, stairs Relieving factors: rest, does not use ice/heat    Was a 10/10 on Tuesday 2/6, could not walk   PERTINENT HISTORY: Scleroderma, Morphea Bilateral arthroscopic knee surgery 13 yrs approx    PRECAUTIONS: None   WEIGHT BEARING RESTRICTIONS: No   FALLS:  Has patient fallen in last 6 months?  Can occasionally lose her balance   PATIENT GOALS: I want to be able to get up better improve walking and posture   OBJECTIVE: (objective measures completed at initial evaluation unless otherwise dated)   DIAGNOSTIC FINDINGS:    06/2022: Rt and Lt. LE  Severe lateral compartment narrowing with lateral and intercondylar  osteophytes was noted.  Severe patellofemoral narrowing was noted.  No  chondrocalcinosis was noted.   Impression: These findings are consistent with severe osteoarthritis and  severe chondromalacia patella.      PATIENT SURVEYS:  FOTO 60% 11/03/22 FOTO  57%   COGNITION: Overall cognitive status: Within functional limits for tasks assessed                         SENSATION: WFL  Toes numb but not sure if she has neuropathy     EDEMA:  NT   MUSCLE LENGTH: NT    POSTURE: rounded shoulders, forward head, increased thoracic kyphosis, flexed trunk , and genu valgus   PALPATION: Pain medial compartment and TTP throughout peri-patellar   LOWER EXTREMITY ROM:   Active ROM Right eval Left eval 11/03/22 Right 11/03/22 Left 11/08/22 Rt  Hip flexion         Hip extension         Hip  abduction         Hip adduction         Hip internal rotation         Hip external rotation         Knee flexion 102 108 92 P! 110 98  Knee extension 8 11     Ankle dorsiflexion         Ankle plantarflexion         Ankle inversion         Ankle eversion          (Blank rows = not tested)   LOWER EXTREMITY MMT:   MMT Right eval Left eval Rt/Lt. 11/22/22  Hip flexion 4 4 4+/5, 4+/5  Hip extension       Hip abduction 3- 3-   Hip adduction       Hip internal rotation       Hip external rotation       Knee flexion 4+ 4+ 4+/5 pain, 4+/5   Knee extension 4+ 4+ 4+/5 pain, 4+/5  Ankle dorsiflexion 5 5   Ankle plantarflexion       Ankle inversion       Ankle eversion        (Blank rows = not tested)   LOWER EXTREMITY SPECIAL TESTS:  NT    FUNCTIONAL TESTS:  5 times sit to stand: 43 sec : 11/03/22: 30 sec   11/22/22: lacks hip extension, uses hands on thighs : 33 sec pain  30 seconds chair stand test _NT  Timed up and go (TUG): NT  2 minute walk test: EVAL: 288 feet   11/22/22: 312 feet    GAIT: Distance walked: 150 Assistive device utilized: None Level of assistance: Modified independence Comments: trunk flexed, genu valgus      TODAY'S TREATMENT:  [facility] Adult PT Treatment:                                                DATE: 12/10/22 Aquatic therapy at [address] - therapeutic pool temp approximately 92 degrees. Pt enters building ambulating independently. Treatment took place in water 3.8 to  4 ft 8 in.feet deep depending upon activity.  Pt entered and exited the pool via stair and handrails independently.  Therapeutic Exercise: Walking forward/side stepping holding white barbell STS from bench to submerged step x10 At edge of pool, pt performed LE exercise: Hip abd/add x20 BIL Hip Circles CC/CCW x10 each BIL Hamstring curl x20 BIL (small ROM on Rt) Squats x20 Heel raise x20 Step ups on submerged step x10 BIL Sitting on  bench in  water: LAQ Marching  Pt requires the buoyancy of water for active assisted exercises with buoyancy supported for strengthening and AROM exercises. Hydrostatic pressure also supports joints by unweighting joint load by at least 50 % in 3-4 feet depth water. 80% in chest to neck deep water. Water will provide assistance with movement using the current and laminar flow while the buoyancy reduces weight bearing. Pt requires the viscosity of the water for resistance with strengthening exercises.   [facility] Adult PT Treatment:                                                DATE: 12/06/22 Therapeutic Exercise: Parallel bars for standing ther ex: retrogait x 4  Lateral band walking red band x 4 x 10 feet Lateral walking without band in mini squat 2 x 10 feet  High knee march slow x 10 Step up for glutes 4 inch x 10 each  Hip abduction x 15 edge of step  Sit to stand with red band x 10 added Airex to increase chair hgt. X 10  Hinge bilateral LEs x 15    [facility] Adult PT Treatment:                                                DATE: 12/01/22 Therapeutic Exercise: Nustep LE and UE 7 min L4  LAQ and hamstring curl seated green band with PT assist Seated clam green x 15  Bridge legs on bolster (glute) x 15 Hamstring bridge on bolster x 15  Hamstring stretch 30 sec x 2 each LE   Self care: Use of cane , surgical information, aftercare, Cubi                                                                                                                             PATIENT EDUCATION:  Education details: modified HEP, ice, follow up with MD   Person educated: Patient Education method: Explanation, Demonstration, and Handouts Education comprehension: verbalized understanding, returned demonstration, and needs further education   HOME EXERCISE PROGRAM: Access Code: [email or web address] URL: [email or web address] Date: 10/21/2022 Prepared by: Gar Ponto  Exercises - Seated Hamstring Stretch  - 1  x daily - 7 x weekly - 1 sets - 3 reps - 30 hold - Gastroc Stretch on Wall  - 1 x daily - 7 x weekly - 1 sets - 3 reps - 30 hold - Hip Flexion  - 1 x daily - 7 x weekly - 1 sets - 5 reps - 3 hold - Standing Hip Flexor Stretch  - 1 x daily - 7 x weekly - 1 sets - 3 reps - 30 hold - Active Straight  Leg Raise with Quad Set  - 1 x daily - 7 x weekly - 1 sets - 5-10 reps - 3 hold - Bridge with Hip Abduction and Resistance  - 1 x daily - 7 x weekly - 1 sets - 5-10 reps - 3 hold - Hooklying Clamshell with Resistance  - 1 x daily - 7 x weekly - 1 sets - 10 reps - 3 hold - Sit to Stand with Counter Support  - 1 x daily - 7 x weekly - 2 sets - 5 reps - Seated Long Arc Quad  - 1 x daily - 7 x weekly - 2 sets - 10 reps - 3 hold   ASSESSMENT:   CLINICAL IMPRESSION: Patient presents to aquatic PT session reporting improved overall pain in BIL knees. Session today focused on BIL LE strengthening and general conditioning in the aquatic environment for use of buoyancy to offload joints and the viscosity of water as resistance during therapeutic exercise. Patient was able to tolerate all prescribed exercises in the aquatic environment with no adverse effects. Patient continues to benefit from skilled PT services on land and aquatic based and should be progressed as able to improve functional independence.  OBJECTIVE IMPAIRMENTS: Abnormal gait, decreased activity tolerance, decreased mobility, difficulty walking, decreased ROM, decreased strength, hypomobility, increased fascial restrictions, impaired flexibility, improper body mechanics, postural dysfunction, and pain.    ACTIVITY LIMITATIONS: carrying, lifting, bending, standing, squatting, stairs, transfers, locomotion level, and caring for others   PARTICIPATION LIMITATIONS: cleaning, laundry, shopping, and community activity   PERSONAL FACTORS: Age, Time since onset of injury/illness/exacerbation, and 1-2 comorbidities: scleroderma, previous knee scopes  are  also affecting patient's functional outcome.     GOALS: Goals reviewed with patient? Yes   SHORT TERM GOALS: Target date: 11/05/2022     Pt will be I with HEP for LE strengthening  Baseline: Status: Ind Goal status: MET   2.  Pt will note greater ease of sit to stand transfers due to improved knee mobility Baseline: Status: 10/21/22= Pt reports effort level for STS is the same Goal status:  ongoing     3.  Pt will be able to increase steps per day to 4000 Baseline: 2 [phone] steps  Status: 10/21/22= 4000-5000 per pt report Goal status: MET     LONG TERM GOALS: Target date: 11/19/2022     Pt will be I with final HEP  Baseline:  Goal status:ongoing    2.  FOTO score will increase to 70% or better  Baseline: 60% Goal status: ongoing    3.  Pt will be able to perform sit to stand x 5 (no UEs in < 30 sec)  Baseline: 43 sec with UE assist  Goal status: ongoing    4.  Pt will be able to complete 2 min walk test to 350 feet or more Baseline: 288 feet, 312 feet  Goal status: ongoing    5.  Pt will increase hip strength to 4/5 to improve gait stability and support knees  Baseline: 3-/5 Goal status: ongoing    PLAN:    PT FREQUENCY: 2x/week   PT DURATION: 8 weeks   PLANNED INTERVENTIONS: Therapeutic exercises, Therapeutic activity, Neuromuscular re-education, Balance training, Gait training, Patient/Family education, Self Care, Joint mobilization, Stair training, Aquatic Therapy, Cryotherapy, Moist heat, Taping, Manual therapy, and Re-evaluation   PLAN FOR NEXT SESSION: how is L knee? NuStep, hip strength as able, tolerated. progress ROM and strength .Aquatics , schedule 1 x water 1 x  land per week .    Margarette Canada, PTA 12/10/22 3:09 PM

## 2022-12-13 ENCOUNTER — Ambulatory Visit: Payer: Medicare Other | Admitting: Physical Therapy

## 2022-12-13 ENCOUNTER — Encounter: Payer: Self-pay | Admitting: Physical Therapy

## 2022-12-13 DIAGNOSIS — M25562 Pain in left knee: Secondary | ICD-10-CM | POA: Diagnosis not present

## 2022-12-13 DIAGNOSIS — G8929 Other chronic pain: Secondary | ICD-10-CM

## 2022-12-13 DIAGNOSIS — R262 Difficulty in walking, not elsewhere classified: Secondary | ICD-10-CM

## 2022-12-13 DIAGNOSIS — M25561 Pain in right knee: Secondary | ICD-10-CM | POA: Diagnosis not present

## 2022-12-13 NOTE — Therapy (Signed)
OUTPATIENT PHYSICAL THERAPY TREATMENT NOTE   Patient Name: Rhonda Buchanan MRN: [id number] DOB:12/08/1945, 77 y.o., female Today's Date: 12/13/2022  PCP: Denita Lung, MD  REFERRING PROVIDER: Estanislado Pandy, MD  END OF SESSION:   PT End of Session - 12/13/22 1109     Visit Number 19    Date for PT Re-Evaluation 01/17/23    Authorization Type [employer] Medicare    PT Start Time 1102    PT Stop Time 1152    PT Time Calculation (min) 50 min    Activity Tolerance Patient tolerated treatment well    Behavior During Therapy WFL for tasks assessed/performed                Past Medical History:  Diagnosis Date   Allergy    Arthritis    Hypercholesterolemia    Hypertension    PUD (peptic ulcer disease)    Scleroderma ([place])    Past Surgical History:  Procedure Laterality Date   arthroscopy     CYST REMOVAL HAND     EYE SURGERY Left 10/25/2017   laser surgery    KNEE ARTHROPLASTY     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/2022   Pain due to onychomycosis of toenails of both feet 08/24/2022   Callus 08/24/2022   PAD (peripheral artery disease) ([place]) 04/22/2022   Aortic atherosclerosis ([place]) 04/15/2021   High risk medication use 04/09/2020   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   Atherosclerosis of coronary artery of native heart without angina pectoris 10/05/2016   Aortic stenosis 05/21/2016   Abnormal PFTs 05/21/2016   Asthma due to environmental allergies 06/24/2015   Positive PPD, treated 06/05/2015   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/14/2014   Scleroderma ([place]) 07/12/2013   Hypertension 03/04/2011   Hyperlipidemia 03/04/2011   Allergic rhinitis 03/04/2011   Arthritis 03/04/2011   Migraine variant 04/18/1995    REFERRING DIAG: M17.0 (ICD-10-CM) - Primary osteoarthritis of both knees   THERAPY DIAG:  Chronic pain of left knee  Difficulty in walking, not elsewhere classified  Chronic pain of right knee  Rationale  for Evaluation and Treatment Rehabilitation  SUBJECTIVE:    SUBJECTIVE STATEMENT: Patient without much pain today, did not rate either knee.  The pool was Friday, did her Cubi Sat and HEP Sunday. Sees Dr. Anitra Lauth.    PAIN:  Are you having pain?   Yes: NPRS scale: 2/10 Lt knee. 5-6/10 Rt knee  Pain location:knees.  Also Rt ankle/shin painful  Pain description: aching pressure  Aggravating factors: walking, stairs Relieving factors: rest, does not use ice/heat    PERTINENT HISTORY: Scleroderma, Morphea Bilateral arthroscopic knee surgery 13 yrs approx    PRECAUTIONS: None   WEIGHT BEARING RESTRICTIONS: No   FALLS:  Has patient fallen in last 6 months?  Can occasionally lose her balance   PATIENT GOALS: I want to be able to get up better improve walking and posture   OBJECTIVE: (objective measures completed at initial evaluation unless otherwise dated)   DIAGNOSTIC FINDINGS:    06/2022: Rt and Lt. LE  Severe lateral compartment narrowing with lateral and intercondylar  osteophytes was noted.  Severe patellofemoral narrowing was noted.  No  chondrocalcinosis was noted.   Impression: These findings are consistent with severe osteoarthritis and  severe chondromalacia patella.      PATIENT SURVEYS:  FOTO 60% 11/03/22 FOTO 57%  12/13/22 FOTO 54%    COGNITION: Overall cognitive status: Within functional limits for tasks  assessed                         SENSATION: WFL  Toes numb but not sure if she has neuropathy     EDEMA:  NT   MUSCLE LENGTH: NT    POSTURE: rounded shoulders, forward head, increased thoracic kyphosis, flexed trunk , and genu valgus   PALPATION: Pain medial compartment and TTP throughout peri-patellar   LOWER EXTREMITY ROM:   Active ROM Right eval Left eval 11/03/22 Right 11/03/22 Left 11/08/22 Rt  Hip flexion         Hip extension         Hip abduction         Hip adduction         Hip internal rotation         Hip external rotation          Knee flexion 102 108 92 P! 110 98  Knee extension 8 11     Ankle dorsiflexion         Ankle plantarflexion         Ankle inversion         Ankle eversion          (Blank rows = not tested)   LOWER EXTREMITY MMT:   MMT Right eval Left eval Rt/Lt. 11/22/22 Rt/Lt.  12/13/22  Hip flexion 4 4 4+/5, 4+/5   Hip extension        Hip abduction 3- 3- 3- 3-/5, 2+/5  Hip adduction        Hip internal rotation        Hip external rotation        Knee flexion 4+ 4+ 4+/5 pain, 4+/5    Knee extension 4+ 4+ 4+/5 pain, 4+/5   Ankle dorsiflexion 5 5    Ankle plantarflexion        Ankle inversion        Ankle eversion         (Blank rows = not tested)   LOWER EXTREMITY SPECIAL TESTS:  NT    FUNCTIONAL TESTS:  5 times sit to stand: 43 sec : 11/03/22: 30 sec   11/22/22: lacks hip extension, uses hands on thighs : 33 sec pain  30 seconds chair stand test _NT  Timed up and go (TUG): NT  2 minute walk test: EVAL: 288 feet   11/22/22: 312 feet    GAIT: Distance walked: 150 Assistive device utilized: None Level of assistance: Modified independence Comments: trunk flexed, genu valgus      TODAY'S TREATMENT:    [facility] Adult PT Treatment:                                                DATE: 12/13/22 Therapeutic Exercise: NuStep L5 UE and LE for 6 min Standing calf on slantboard x 1 x 60 sec each  Red looped band hip abd x 10 each , cues for form Knee extension with thigh supported (supine) x 10 each  SAQ , 3 lbs cuff x 20 Supine ball squeeze with alt. Knee ext x 10  Bridging x 10 knees wide  SL hip abduction with pillow between knees small ROM due to weakness 2 x 10  Prone  knee flexion x 10 (AROM Prone hip extension x 10   [facility]  Adult PT Treatment:                                                DATE: 12/10/22 Aquatic therapy at [address] - therapeutic pool temp approximately 92 degrees. Pt enters building ambulating independently. Treatment took place in water 3.8  to  4 ft 8 in.feet deep depending upon activity.  Pt entered and exited the pool via stair and handrails independently.  Therapeutic Exercise: Walking forward/side stepping holding white barbell STS from bench to submerged step x10 At edge of pool, pt performed LE exercise: Hip abd/add x20 BIL Hip Circles CC/CCW x10 each BIL Hamstring curl x20 BIL (small ROM on Rt) Squats x20 Heel raise x20 Step ups on submerged step x10 BIL Sitting on bench in water: LAQ Marching  Pt requires the buoyancy of water for active assisted exercises with buoyancy supported for strengthening and AROM exercises. Hydrostatic pressure also supports joints by unweighting joint load by at least 50 % in 3-4 feet depth water. 80% in chest to neck deep water. Water will provide assistance with movement using the current and laminar flow while the buoyancy reduces weight bearing. Pt requires the viscosity of the water for resistance with strengthening exercises.   [facility] Adult PT Treatment:                                                DATE: 12/06/22 Therapeutic Exercise: Parallel bars for standing ther ex: retrogait x 4  Lateral band walking red band x 4 x 10 feet Lateral walking without band in mini squat 2 x 10 feet  High knee march slow x 10 Step up for glutes 4 inch x 10 each  Hip abduction x 15 edge of step  Sit to stand with red band x 10 added Airex to increase chair hgt. X 10  Hinge bilateral LEs x 15    [facility] Adult PT Treatment:                                                DATE: 12/01/22 Therapeutic Exercise: Nustep LE and UE 7 min L4  LAQ and hamstring curl seated green band with PT assist Seated clam green x 15  Bridge legs on bolster (glute) x 15 Hamstring bridge on bolster x 15  Hamstring stretch 30 sec x 2 each LE   Self care: Use of cane , surgical information, aftercare, Cubi  PATIENT EDUCATION:  Education details: modified HEP, ice, follow up with MD   Person educated: Patient Education method: Explanation, Demonstration, and Handouts Education comprehension: verbalized understanding, returned demonstration, and needs further education   HOME EXERCISE PROGRAM: Access Code: [email or web address] URL: [email or web address] Date: 10/21/2022 Prepared by: Gar Ponto  Exercises - Seated Hamstring Stretch  - 1 x daily - 7 x weekly - 1 sets - 3 reps - 30 hold - Gastroc Stretch on Wall  - 1 x daily - 7 x weekly - 1 sets - 3 reps - 30 hold - Hip Flexion  - 1 x daily - 7 x weekly - 1 sets - 5 reps - 3 hold - Standing Hip Flexor Stretch  - 1 x daily - 7 x weekly - 1 sets - 3 reps - 30 hold - Active Straight Leg Raise with Quad Set  - 1 x daily - 7 x weekly - 1 sets - 5-10 reps - 3 hold - Bridge with Hip Abduction and Resistance  - 1 x daily - 7 x weekly - 1 sets - 5-10 reps - 3 hold - Hooklying Clamshell with Resistance  - 1 x daily - 7 x weekly - 1 sets - 10 reps - 3 hold - Sit to Stand with Counter Support  - 1 x daily - 7 x weekly - 2 sets - 5 reps - Seated Long Arc Quad  - 1 x daily - 7 x weekly - 2 sets - 10 reps - 3 hold   ASSESSMENT:   CLINICAL IMPRESSION: Hip strength in abduction is 3-/5 (or less) and contributes to her malalignment. She was able to get into prone today for there ex without much difficulty.  All movements are slow and guarded.  Cautious about knee AROM.  Standing has a high potential to aggravate her knee pain so majority of the  session done on the mat. Needed heavy cue for sidelying work and discussed compensatory patterns with hip abd.    OBJECTIVE IMPAIRMENTS: Abnormal gait, decreased activity tolerance, decreased mobility, difficulty walking, decreased ROM, decreased strength, hypomobility, increased fascial restrictions, impaired flexibility, improper body mechanics, postural dysfunction, and pain.    ACTIVITY LIMITATIONS:  carrying, lifting, bending, standing, squatting, stairs, transfers, locomotion level, and caring for others   PARTICIPATION LIMITATIONS: cleaning, laundry, shopping, and community activity   PERSONAL FACTORS: Age, Time since onset of injury/illness/exacerbation, and 1-2 comorbidities: scleroderma, previous knee scopes  are also affecting patient's functional outcome.     GOALS: Goals reviewed with patient? Yes   SHORT TERM GOALS: Target date: 11/05/2022     Pt will be I with HEP for LE strengthening  Baseline: Status: Ind Goal status: MET   2.  Pt will note greater ease of sit to stand transfers due to improved knee mobility Baseline: Status: 10/21/22= Pt reports effort level for STS is the same Goal status:  ongoing     3.  Pt will be able to increase steps per day to 4000 Baseline: 2 [phone] steps  Status: 10/21/22= 4000-5000 per pt report Goal status: MET     LONG TERM GOALS: Target date: 11/19/2022     Pt will be I with final HEP  Baseline:  Goal status:ongoing    2.  FOTO score will increase to 70% or better  Baseline: 60% Goal status: ongoing    3.  Pt will be able to perform sit to stand x 5 (no UEs in < 30 sec)  Baseline: 43 sec with UE  assist  Goal status: ongoing    4.  Pt will be able to complete 2 min walk test to 350 feet or more Baseline: 288 feet, 312 feet  Goal status: ongoing    5.  Pt will increase hip strength to 4/5 to improve gait stability and support knees  Baseline: 3-/5 Goal status: ongoing    PLAN:    PT FREQUENCY: 2x/week   PT DURATION: 8 weeks   PLANNED INTERVENTIONS: Therapeutic exercises, Therapeutic activity, Neuromuscular re-education, Balance training, Gait training, Patient/Family education, Self Care, Joint mobilization, Stair training, Aquatic Therapy, Cryotherapy, Moist heat, Taping, Manual therapy, and Re-evaluation   PLAN FOR NEXT SESSION: NuStep, hip strength as able, tolerated. progress ROM and strength .Aquatics , schedule  1 x water 1 x land per week .    Raeford Razor, PT 12/13/22 11:56 AM Phone: [phone] Fax: [phone]

## 2022-12-15 DIAGNOSIS — M25561 Pain in right knee: Secondary | ICD-10-CM | POA: Diagnosis not present

## 2022-12-15 DIAGNOSIS — M25562 Pain in left knee: Secondary | ICD-10-CM | POA: Diagnosis not present

## 2022-12-16 NOTE — Therapy (Signed)
OUTPATIENT PHYSICAL THERAPY TREATMENT NOTE   Patient Name: Rhonda Buchanan MRN: [id number] DOB:1946/09/02, 77 y.o., female Today's Date: 12/17/2022  PCP: Denita Lung, MD  REFERRING PROVIDER: Estanislado Pandy, MD  END OF SESSION:   PT End of Session - 12/17/22 1315     Visit Number 20    Date for PT Re-Evaluation 01/17/23    Authorization Type UHC Medicare    PT Start Time 1315    PT Stop Time 1400    PT Time Calculation (min) 45 min    Activity Tolerance Patient tolerated treatment well    Behavior During Therapy WFL for tasks assessed/performed                 Past Medical History:  Diagnosis Date   Allergy    Arthritis    Hypercholesterolemia    Hypertension    PUD (peptic ulcer disease)    Scleroderma ([place])    Past Surgical History:  Procedure Laterality Date   arthroscopy     CYST REMOVAL HAND     EYE SURGERY Left 10/25/2017   laser surgery    KNEE ARTHROPLASTY     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/2022   Pain due to onychomycosis of toenails of both feet 08/24/2022   Callus 08/24/2022   PAD (peripheral artery disease) ([place]) 04/22/2022   Aortic atherosclerosis ([place]) 04/15/2021   High risk medication use 04/09/2020   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   Atherosclerosis of coronary artery of native heart without angina pectoris 10/05/2016   Aortic stenosis 05/21/2016   Abnormal PFTs 05/21/2016   Asthma due to environmental allergies 06/24/2015   Positive PPD, treated 06/05/2015   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/14/2014   Scleroderma ([place]) 07/12/2013   Hypertension 03/04/2011   Hyperlipidemia 03/04/2011   Allergic rhinitis 03/04/2011   Arthritis 03/04/2011   Migraine variant 04/18/1995    REFERRING DIAG: M17.0 (ICD-10-CM) - Primary osteoarthritis of both knees   THERAPY DIAG:  Chronic pain of left knee  Difficulty in walking, not elsewhere classified  Chronic pain of right knee  Rationale  for Evaluation and Treatment Rehabilitation  SUBJECTIVE:    SUBJECTIVE STATEMENT: Patient reports improved pain today. She saw MD this week who recommended knee placements BIL, Rt first.    PAIN:  Are you having pain?   Yes: NPRS scale: 2/10 Lt knee. 5-6/10 Rt knee  Pain location:knees.  Also Rt ankle/shin painful  Pain description: aching pressure  Aggravating factors: walking, stairs Relieving factors: rest, does not use ice/heat    PERTINENT HISTORY: Scleroderma, Morphea Bilateral arthroscopic knee surgery 13 yrs approx    PRECAUTIONS: None   WEIGHT BEARING RESTRICTIONS: No   FALLS:  Has patient fallen in last 6 months?  Can occasionally lose her balance   PATIENT GOALS: I want to be able to get up better improve walking and posture   OBJECTIVE: (objective measures completed at initial evaluation unless otherwise dated)   DIAGNOSTIC FINDINGS:    06/2022: Rt and Lt. LE  Severe lateral compartment narrowing with lateral and intercondylar  osteophytes was noted.  Severe patellofemoral narrowing was noted.  No  chondrocalcinosis was noted.   Impression: These findings are consistent with severe osteoarthritis and  severe chondromalacia patella.      PATIENT SURVEYS:  FOTO 60% 11/03/22 FOTO 57%  12/13/22 FOTO 54%    COGNITION: Overall cognitive status: Within functional limits for tasks assessed  SENSATION: WFL  Toes numb but not sure if she has neuropathy     EDEMA:  NT   MUSCLE LENGTH: NT    POSTURE: rounded shoulders, forward head, increased thoracic kyphosis, flexed trunk , and genu valgus   PALPATION: Pain medial compartment and TTP throughout peri-patellar   LOWER EXTREMITY ROM:   Active ROM Right eval Left eval 11/03/22 Right 11/03/22 Left 11/08/22 Rt  Hip flexion         Hip extension         Hip abduction         Hip adduction         Hip internal rotation         Hip external rotation         Knee flexion 102 108  92 P! 110 98  Knee extension 8 11     Ankle dorsiflexion         Ankle plantarflexion         Ankle inversion         Ankle eversion          (Blank rows = not tested)   LOWER EXTREMITY MMT:   MMT Right eval Left eval Rt/Lt. 11/22/22 Rt/Lt.  12/13/22  Hip flexion 4 4 4+/5, 4+/5   Hip extension        Hip abduction 3- 3- 3- 3-/5, 2+/5  Hip adduction        Hip internal rotation        Hip external rotation        Knee flexion 4+ 4+ 4+/5 pain, 4+/5    Knee extension 4+ 4+ 4+/5 pain, 4+/5   Ankle dorsiflexion 5 5    Ankle plantarflexion        Ankle inversion        Ankle eversion         (Blank rows = not tested)   LOWER EXTREMITY SPECIAL TESTS:  NT    FUNCTIONAL TESTS:  5 times sit to stand: 43 sec : 11/03/22: 30 sec   11/22/22: lacks hip extension, uses hands on thighs : 33 sec pain  30 seconds chair stand test _NT  Timed up and go (TUG): NT  2 minute walk test: EVAL: 288 feet   11/22/22: 312 feet    GAIT: Distance walked: 150 Assistive device utilized: None Level of assistance: Modified independence Comments: trunk flexed, genu valgus      TODAY'S TREATMENT:  [facility] Adult PT Treatment:                                                DATE: 12/17/22 Aquatic therapy at [address] - therapeutic pool temp approximately 92 degrees. Pt enters building ambulating independently. Treatment took place in water 3.8 to  4 ft 8 in.feet deep depending upon activity.  Pt entered and exited the pool via stair and handrails independently.  Therapeutic Exercise: Walking forward/side stepping holding blue barbell STS from bench to submerged step x10 At edge of pool, pt performed LE exercise: Hip abd/add x20 BIL Hip Circles CC/CCW x10 each BIL Hamstring curl x20 BIL (small ROM on Rt) Squats x20 Heel raise x20 Step ups on submerged step x10 BIL fwd/lat   Pt requires the buoyancy of water for active assisted exercises with buoyancy supported for strengthening and AROM  exercises. Hydrostatic  pressure also supports joints by unweighting joint load by at least 50 % in 3-4 feet depth water. 80% in chest to neck deep water. Water will provide assistance with movement using the current and laminar flow while the buoyancy reduces weight bearing. Pt requires the viscosity of the water for resistance with strengthening exercises.  [facility] Adult PT Treatment:                                                DATE: 12/13/22 Therapeutic Exercise: NuStep L5 UE and LE for 6 min Standing calf on slantboard x 1 x 60 sec each  Red looped band hip abd x 10 each , cues for form Knee extension with thigh supported (supine) x 10 each  SAQ , 3 lbs cuff x 20 Supine ball squeeze with alt. Knee ext x 10  Bridging x 10 knees wide  SL hip abduction with pillow between knees small ROM due to weakness 2 x 10  Prone  knee flexion x 10 (AROM Prone hip extension x 10   [facility] Adult PT Treatment:                                                DATE: 12/10/22 Aquatic therapy at [address] - therapeutic pool temp approximately 92 degrees. Pt enters building ambulating independently. Treatment took place in water 3.8 to  4 ft 8 in.feet deep depending upon activity.  Pt entered and exited the pool via stair and handrails independently.  Therapeutic Exercise: Walking forward/side stepping holding white barbell STS from bench to submerged step x10 At edge of pool, pt performed LE exercise: Hip abd/add x20 BIL Hip Circles CC/CCW x10 each BIL Hamstring curl x20 BIL (small ROM on Rt) Squats x20 Heel raise x20 Step ups on submerged step x10 BIL Sitting on bench in water: LAQ Marching  Pt requires the buoyancy of water for active assisted exercises with buoyancy supported for strengthening and AROM exercises. Hydrostatic pressure also supports joints by unweighting joint load by at least 50 % in 3-4 feet depth water. 80% in chest to neck deep water. Water will provide assistance  with movement using the current and laminar flow while the buoyancy reduces weight bearing. Pt requires the viscosity of the water for resistance with strengthening exercises.                   PATIENT EDUCATION:  Education details: modified HEP, ice, follow up with MD   Person educated: Patient Education method: Explanation, Demonstration, and Handouts Education comprehension: verbalized understanding, returned demonstration, and needs further education   HOME EXERCISE PROGRAM: Access Code: [email or web address] URL: [email or web address] Date: 10/21/2022 Prepared by: Gar Ponto  Exercises - Seated Hamstring Stretch  - 1 x daily - 7 x weekly - 1 sets - 3 reps - 30 hold - Gastroc Stretch on Wall  - 1 x daily - 7 x weekly - 1 sets - 3 reps - 30 hold - Hip Flexion  - 1 x daily - 7 x weekly - 1 sets - 5 reps - 3 hold - Standing Hip Flexor Stretch  - 1 x daily - 7 x weekly - 1 sets - 3 reps - 30 hold -  Active Straight Leg Raise with Quad Set  - 1 x daily - 7 x weekly - 1 sets - 5-10 reps - 3 hold - Bridge with Hip Abduction and Resistance  - 1 x daily - 7 x weekly - 1 sets - 5-10 reps - 3 hold - Hooklying Clamshell with Resistance  - 1 x daily - 7 x weekly - 1 sets - 10 reps - 3 hold - Sit to Stand with Counter Support  - 1 x daily - 7 x weekly - 2 sets - 5 reps - Seated Long Arc Quad  - 1 x daily - 7 x weekly - 2 sets - 10 reps - 3 hold   ASSESSMENT:   CLINICAL IMPRESSION: Patient presents to PT reporting improved overall pain today and states that her doctor has recommended BIL knee replacements, with the first needing to be the Rt. She is still thinking this over. Session today focused on proximal hip strengthening with particular focus on abductors in the aquatic environment for use of buoyancy to offload joints and the viscosity of water as resistance during therapeutic exercise. Patient was able to tolerate all prescribed exercises in the aquatic environment with no adverse effects.  Patient continues to benefit from skilled PT services on land and aquatic based and should be progressed as able to improve functional independence.    OBJECTIVE IMPAIRMENTS: Abnormal gait, decreased activity tolerance, decreased mobility, difficulty walking, decreased ROM, decreased strength, hypomobility, increased fascial restrictions, impaired flexibility, improper body mechanics, postural dysfunction, and pain.    ACTIVITY LIMITATIONS: carrying, lifting, bending, standing, squatting, stairs, transfers, locomotion level, and caring for others   PARTICIPATION LIMITATIONS: cleaning, laundry, shopping, and community activity   PERSONAL FACTORS: Age, Time since onset of injury/illness/exacerbation, and 1-2 comorbidities: scleroderma, previous knee scopes  are also affecting patient's functional outcome.     GOALS: Goals reviewed with patient? Yes   SHORT TERM GOALS: Target date: 11/05/2022     Pt will be I with HEP for LE strengthening  Baseline: Status: Ind Goal status: MET   2.  Pt will note greater ease of sit to stand transfers due to improved knee mobility Baseline: Status: 10/21/22= Pt reports effort level for STS is the same Goal status:  ongoing     3.  Pt will be able to increase steps per day to 4000 Baseline: 2 [phone] steps  Status: 10/21/22= 4000-5000 per pt report Goal status: MET     LONG TERM GOALS: Target date: 11/19/2022     Pt will be I with final HEP  Baseline:  Goal status:ongoing    2.  FOTO score will increase to 70% or better  Baseline: 60% Goal status: ongoing    3.  Pt will be able to perform sit to stand x 5 (no UEs in < 30 sec)  Baseline: 43 sec with UE assist  Goal status: ongoing    4.  Pt will be able to complete 2 min walk test to 350 feet or more Baseline: 288 feet, 312 feet  Goal status: ongoing    5.  Pt will increase hip strength to 4/5 to improve gait stability and support knees  Baseline: 3-/5 Goal status: ongoing    PLAN:     PT FREQUENCY: 2x/week   PT DURATION: 8 weeks   PLANNED INTERVENTIONS: Therapeutic exercises, Therapeutic activity, Neuromuscular re-education, Balance training, Gait training, Patient/Family education, Self Care, Joint mobilization, Stair training, Aquatic Therapy, Cryotherapy, Moist heat, Taping, Manual therapy, and Re-evaluation  PLAN FOR NEXT SESSION: NuStep, hip strength as able, tolerated. progress ROM and strength .Aquatics , schedule 1 x water 1 x land per week .    Margarette Canada, PTA 12/17/22 1:16 PM

## 2022-12-17 ENCOUNTER — Ambulatory Visit: Payer: Medicare Other | Attending: Family Medicine

## 2022-12-17 DIAGNOSIS — M25562 Pain in left knee: Secondary | ICD-10-CM | POA: Diagnosis not present

## 2022-12-17 DIAGNOSIS — M25561 Pain in right knee: Secondary | ICD-10-CM | POA: Diagnosis not present

## 2022-12-17 DIAGNOSIS — R262 Difficulty in walking, not elsewhere classified: Secondary | ICD-10-CM | POA: Diagnosis not present

## 2022-12-17 DIAGNOSIS — G8929 Other chronic pain: Secondary | ICD-10-CM | POA: Insufficient documentation

## 2022-12-20 DIAGNOSIS — R5382 Chronic fatigue, unspecified: Secondary | ICD-10-CM | POA: Diagnosis not present

## 2022-12-20 DIAGNOSIS — Z79899 Other long term (current) drug therapy: Secondary | ICD-10-CM | POA: Diagnosis not present

## 2022-12-20 DIAGNOSIS — L940 Localized scleroderma [morphea]: Secondary | ICD-10-CM | POA: Diagnosis not present

## 2022-12-20 DIAGNOSIS — H209 Unspecified iridocyclitis: Secondary | ICD-10-CM | POA: Diagnosis not present

## 2022-12-20 NOTE — Therapy (Unsigned)
OUTPATIENT PHYSICAL THERAPY TREATMENT NOTE   Patient Name: Rhonda Buchanan MRN: [id number] DOB:August 23, 1946, 77 y.o., female Today's Date: 12/21/2022  PCP: Denita Lung, MD  REFERRING PROVIDER: Estanislado Pandy, MD  END OF SESSION:   PT End of Session - 12/21/22 1150     Visit Number 21    Date for PT Re-Evaluation 01/17/23    Authorization Type [employer] Medicare    PT Start Time [id number]    PT Stop Time 1230    PT Time Calculation (min) 45 min    Activity Tolerance Patient tolerated treatment well    Behavior During Therapy WFL for tasks assessed/performed                  Past Medical History:  Diagnosis Date   Allergy    Arthritis    Hypercholesterolemia    Hypertension    PUD (peptic ulcer disease)    Scleroderma ([place])    Past Surgical History:  Procedure Laterality Date   arthroscopy     CYST REMOVAL HAND     EYE SURGERY Left 10/25/2017   laser surgery    KNEE ARTHROPLASTY     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/2022   Pain due to onychomycosis of toenails of both feet 08/24/2022   Callus 08/24/2022   PAD (peripheral artery disease) ([place]) 04/22/2022   Aortic atherosclerosis ([place]) 04/15/2021   High risk medication use 04/09/2020   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   Atherosclerosis of coronary artery of native heart without angina pectoris 10/05/2016   Aortic stenosis 05/21/2016   Abnormal PFTs 05/21/2016   Asthma due to environmental allergies 06/24/2015   Positive PPD, treated 06/05/2015   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/14/2014   Scleroderma ([place]) 07/12/2013   Hypertension 03/04/2011   Hyperlipidemia 03/04/2011   Allergic rhinitis 03/04/2011   Arthritis 03/04/2011   Migraine variant 04/18/1995    REFERRING DIAG: M17.0 (ICD-10-CM) - Primary osteoarthritis of both knees   THERAPY DIAG:  Chronic pain of left knee  Difficulty in walking, not elsewhere classified  Chronic pain of right  knee  Rationale for Evaluation and Treatment Rehabilitation  SUBJECTIVE:    SUBJECTIVE STATEMENT: Patient reports improved pain today. Was really sore after last session in pool. Meets with surgeon next week.     PAIN:  Are you having pain?   Yes: NPRS scale: 2/10 Lt knee 2/10 Rt knee  Pain location:knees.  Also Rt ankle/shin painful  Pain description: aching pressure  Aggravating factors: walking, stairs Relieving factors: rest, does not use ice/heat    PERTINENT HISTORY: Scleroderma, Morphea Bilateral arthroscopic knee surgery 13 yrs approx    PRECAUTIONS: None   WEIGHT BEARING RESTRICTIONS: No   FALLS:  Has patient fallen in last 6 months?  Can occasionally lose her balance   PATIENT GOALS: I want to be able to get up better improve walking and posture   OBJECTIVE: (objective measures completed at initial evaluation unless otherwise dated)   DIAGNOSTIC FINDINGS:    06/2022: Rt and Lt. LE  Severe lateral compartment narrowing with lateral and intercondylar  osteophytes was noted.  Severe patellofemoral narrowing was noted.  No  chondrocalcinosis was noted.   Impression: These findings are consistent with severe osteoarthritis and  severe chondromalacia patella.      PATIENT SURVEYS:  FOTO 60% 11/03/22 FOTO 57%  12/13/22 FOTO 54%    COGNITION: Overall cognitive status: Within functional limits for tasks assessed  SENSATION: WFL  Toes numb but not sure if she has neuropathy     EDEMA:  NT   MUSCLE LENGTH: NT    POSTURE: rounded shoulders, forward head, increased thoracic kyphosis, flexed trunk , and genu valgus   PALPATION: Pain medial compartment and TTP throughout peri-patellar   LOWER EXTREMITY ROM:   Active ROM Right eval Left eval 11/03/22 Right 11/03/22 Left 11/08/22 Rt  Hip flexion         Hip extension         Hip abduction         Hip adduction         Hip internal rotation         Hip external rotation          Knee flexion 102 108 92 P! 110 98  Knee extension 8 11     Ankle dorsiflexion         Ankle plantarflexion         Ankle inversion         Ankle eversion          (Blank rows = not tested)   LOWER EXTREMITY MMT:   MMT Right eval Left eval Rt/Lt. 11/22/22 Rt/Lt.  12/13/22  Hip flexion 4 4 4+/5, 4+/5   Hip extension        Hip abduction 3- 3- 3- 3-/5, 2+/5  Hip adduction        Hip internal rotation        Hip external rotation        Knee flexion 4+ 4+ 4+/5 pain, 4+/5    Knee extension 4+ 4+ 4+/5 pain, 4+/5   Ankle dorsiflexion 5 5    Ankle plantarflexion        Ankle inversion        Ankle eversion         (Blank rows = not tested)   LOWER EXTREMITY SPECIAL TESTS:  NT    FUNCTIONAL TESTS:  5 times sit to stand: 43 sec : 11/03/22: 30 sec   11/22/22: lacks hip extension, uses hands on thighs : 33 sec pain  30 seconds chair stand test _NT  Timed up and go (TUG): NT  2 minute walk test: EVAL: 288 feet   11/22/22: 312 feet    GAIT: Distance walked: 150 Assistive device utilized: None Level of assistance: Modified independence Comments: trunk flexed, genu valgus      TODAY'S TREATMENT:   [facility] Adult PT Treatment:                                                DATE: 12/21/22 Therapeutic Exercise: Seated LAQ 4 lbs x 15 used ball squeeze  Supine knee to chest x 3 to hip circles  Knee extension AROM with knee to chest x 10  Trunk rotation x 10  Green band double knee fall out, march and bridge  x 10 each LE Sidelying clam Rt LE no band , L LE with green band  2 min walk test with cane, adjusted for hgt  Lateral stepping at countertop no band  Added red band at thighs Then hip abd x 10 each side  x 10  Hip ext x 10 red band    [facility] Adult PT Treatment:  DATE: 12/17/22 Aquatic therapy at [address] - therapeutic pool temp approximately 92 degrees. Pt enters building ambulating independently. Treatment took  place in water 3.8 to  4 ft 8 in.feet deep depending upon activity.  Pt entered and exited the pool via stair and handrails independently.  Therapeutic Exercise: Walking forward/side stepping holding blue barbell STS from bench to submerged step x10 At edge of pool, pt performed LE exercise: Hip abd/add x20 BIL Hip Circles CC/CCW x10 each BIL Hamstring curl x20 BIL (small ROM on Rt) Squats x20 Heel raise x20 Step ups on submerged step x10 BIL fwd/lat   Pt requires the buoyancy of water for active assisted exercises with buoyancy supported for strengthening and AROM exercises. Hydrostatic pressure also supports joints by unweighting joint load by at least 50 % in 3-4 feet depth water. 80% in chest to neck deep water. Water will provide assistance with movement using the current and laminar flow while the buoyancy reduces weight bearing. Pt requires the viscosity of the water for resistance with strengthening exercises.  [facility] Adult PT Treatment:                                                DATE: 12/13/22 Therapeutic Exercise: NuStep L5 UE and LE for 6 min Standing calf on slantboard x 1 x 60 sec each  Red looped band hip abd x 10 each , cues for form Knee extension with thigh supported (supine) x 10 each  SAQ , 3 lbs cuff x 20 Supine ball squeeze with alt. Knee ext x 10  Bridging x 10 knees wide  SL hip abduction with pillow between knees small ROM due to weakness 2 x 10  Prone  knee flexion x 10 (AROM Prone hip extension x 10   [facility] Adult PT Treatment:                                                DATE: 12/10/22 Aquatic therapy at [address] - therapeutic pool temp approximately 92 degrees. Pt enters building ambulating independently. Treatment took place in water 3.8 to  4 ft 8 in.feet deep depending upon activity.  Pt entered and exited the pool via stair and handrails independently.  Therapeutic Exercise: Walking forward/side stepping holding white  barbell STS from bench to submerged step x10 At edge of pool, pt performed LE exercise: Hip abd/add x20 BIL Hip Circles CC/CCW x10 each BIL Hamstring curl x20 BIL (small ROM on Rt) Squats x20 Heel raise x20 Step ups on submerged step x10 BIL Sitting on bench in water: LAQ Marching  Pt requires the buoyancy of water for active assisted exercises with buoyancy supported for strengthening and AROM exercises. Hydrostatic pressure also supports joints by unweighting joint load by at least 50 % in 3-4 feet depth water. 80% in chest to neck deep water. Water will provide assistance with movement using the current and laminar flow while the buoyancy reduces weight bearing. Pt requires the viscosity of the water for resistance with strengthening exercises.                   PATIENT EDUCATION:  Education details: modified HEP, ice, follow up with MD  Person educated: Patient Education method: Explanation, Demonstration, and Handouts Education comprehension: verbalized understanding, returned demonstration, and needs further education   HOME EXERCISE PROGRAM: Access Code: [email or web address] URL: [email or web address] Date: 10/21/2022 Prepared by: Gar Ponto  Exercises - Seated Hamstring Stretch  - 1 x daily - 7 x weekly - 1 sets - 3 reps - 30 hold - Gastroc Stretch on Wall  - 1 x daily - 7 x weekly - 1 sets - 3 reps - 30 hold - Hip Flexion  - 1 x daily - 7 x weekly - 1 sets - 5 reps - 3 hold - Standing Hip Flexor Stretch  - 1 x daily - 7 x weekly - 1 sets - 3 reps - 30 hold - Active Straight Leg Raise with Quad Set  - 1 x daily - 7 x weekly - 1 sets - 5-10 reps - 3 hold - Bridge with Hip Abduction and Resistance  - 1 x daily - 7 x weekly - 1 sets - 5-10 reps - 3 hold - Hooklying Clamshell with Resistance  - 1 x daily - 7 x weekly - 1 sets - 10 reps - 3 hold - Sit to Stand with Counter Support  - 1 x daily - 7 x weekly - 2 sets - 5 reps - Seated Long Arc Quad  - 1 x daily - 7 x weekly  - 2 sets - 10 reps - 3 hold   ASSESSMENT:   CLINICAL IMPRESSION: Patient needs increased time due to slow movement  patterns and positioning for Rt knee pain. She was very sore in her glutes after the pool Friday but did report doing her HEP at home despite that.  She continues to benefit from skilled PT to guide exercise choices and technique.  Aimed to minimize increased knee pain today by starting on the mat table. Her 2 min walk test did not meet goal today but did have less pain.    OBJECTIVE IMPAIRMENTS: Abnormal gait, decreased activity tolerance, decreased mobility, difficulty walking, decreased ROM, decreased strength, hypomobility, increased fascial restrictions, impaired flexibility, improper body mechanics, postural dysfunction, and pain.    ACTIVITY LIMITATIONS: carrying, lifting, bending, standing, squatting, stairs, transfers, locomotion level, and caring for others   PARTICIPATION LIMITATIONS: cleaning, laundry, shopping, and community activity   PERSONAL FACTORS: Age, Time since onset of injury/illness/exacerbation, and 1-2 comorbidities: scleroderma, previous knee scopes  are also affecting patient's functional outcome.     GOALS: Goals reviewed with patient? Yes   SHORT TERM GOALS: Target date: 11/05/2022     Pt will be I with HEP for LE strengthening  Baseline: Status: Ind Goal status: MET   2.  Pt will note greater ease of sit to stand transfers due to improved knee mobility Baseline: Status: 10/21/22= Pt reports effort level for STS is the same Goal status:  ongoing     3.  Pt will be able to increase steps per day to 4000 Baseline: 2 [phone] steps  Status: 10/21/22= 4000-5000 per pt report Goal status: MET     LONG TERM GOALS: Target date: 11/19/2022     Pt will be I with final HEP  Baseline:  Goal status: ongoing    2.  FOTO score will increase to 70% or better  Baseline: 60% Goal status: ongoing    3.  Pt will be able to perform sit to stand x 5 (no  UEs in < 30 sec)  Baseline: 43 sec with UE assist  Goal status: ongoing  4.  Pt will be able to complete 2 min walk test to 350 feet or more Baseline: 288 feet, 312 feet , 285 feet (12/21/22)  Goal status: ongoing    5.  Pt will increase hip strength to 4/5 to improve gait stability and support knees  Baseline: 3-/5 Goal status: ongoing    PLAN:    PT FREQUENCY: 2x/week   PT DURATION: 8 weeks   PLANNED INTERVENTIONS: Therapeutic exercises, Therapeutic activity, Neuromuscular re-education, Balance training, Gait training, Patient/Family education, Self Care, Joint mobilization, Stair training, Aquatic Therapy, Cryotherapy, Moist heat, Taping, Manual therapy, and Re-evaluation   PLAN FOR NEXT SESSION: NuStep, hip strength as able, tolerated. progress ROM and strength .Aquatics , schedule 1 x water 1 x land per week .    Raeford Razor, PT 12/21/22 12:15 PM Phone: [phone] Fax: [phone]

## 2022-12-21 ENCOUNTER — Encounter: Payer: Self-pay | Admitting: Physical Therapy

## 2022-12-21 ENCOUNTER — Ambulatory Visit: Payer: Medicare Other | Admitting: Physical Therapy

## 2022-12-21 DIAGNOSIS — M25562 Pain in left knee: Secondary | ICD-10-CM | POA: Diagnosis not present

## 2022-12-21 DIAGNOSIS — G8929 Other chronic pain: Secondary | ICD-10-CM | POA: Diagnosis not present

## 2022-12-21 DIAGNOSIS — R262 Difficulty in walking, not elsewhere classified: Secondary | ICD-10-CM | POA: Diagnosis not present

## 2022-12-21 DIAGNOSIS — M25561 Pain in right knee: Secondary | ICD-10-CM | POA: Diagnosis not present

## 2022-12-23 NOTE — Therapy (Signed)
OUTPATIENT PHYSICAL THERAPY TREATMENT NOTE   Patient Name: Rhonda Buchanan MRN: [id number] DOB:[birth year], 77 y.o., female 13 Date: 12/24/2022  PCP: Denita Lung, MD  REFERRING PROVIDER: Estanislado Pandy, MD  END OF SESSION:   PT End of Session - 12/24/22 1228     Visit Number 22    Date for PT Re-Evaluation 01/17/23    Authorization Type [employer] Medicare    PT Start Time 1230    PT Stop Time 1315    PT Time Calculation (min) 45 min    Activity Tolerance Patient tolerated treatment well    Behavior During Therapy WFL for tasks assessed/performed              Past Medical History:  Diagnosis Date   Allergy    Arthritis    Hypercholesterolemia    Hypertension    PUD (peptic ulcer disease)    Scleroderma ([place])    Past Surgical History:  Procedure Laterality Date   arthroscopy     CYST REMOVAL HAND     EYE SURGERY Left 10/25/2017   laser surgery    KNEE ARTHROPLASTY     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/2022   Pain due to onychomycosis of toenails of both feet 08/24/2022   Callus 08/24/2022   PAD (peripheral artery disease) ([place]) 04/22/2022   Aortic atherosclerosis ([place]) 04/15/2021   High risk medication use 04/09/2020   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   Atherosclerosis of coronary artery of native heart without angina pectoris 10/05/2016   Aortic stenosis 05/21/2016   Abnormal PFTs 05/21/2016   Asthma due to environmental allergies 06/24/2015   Positive PPD, treated 06/05/2015   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/14/2014   Scleroderma ([place]) 07/12/2013   Hypertension 03/04/2011   Hyperlipidemia 03/04/2011   Allergic rhinitis 03/04/2011   Arthritis 03/04/2011   Migraine variant 04/18/1995    REFERRING DIAG: M17.0 (ICD-10-CM) - Primary osteoarthritis of both knees   THERAPY DIAG:  Chronic pain of left knee  Difficulty in walking, not elsewhere classified  Chronic pain of right knee  Rationale for  Evaluation and Treatment Rehabilitation  SUBJECTIVE:    SUBJECTIVE STATEMENT: Patient reports decreased overall knee pain today, states she meets with the surgeon next week. Still has some apprehension about the TKA.    PAIN:  Are you having pain?   Yes: NPRS scale: 2/10 Lt knee 2/10 Rt knee  Pain location:knees.  Also Rt ankle/shin painful  Pain description: aching pressure  Aggravating factors: walking, stairs Relieving factors: rest, does not use ice/heat    PERTINENT HISTORY: Scleroderma, Morphea Bilateral arthroscopic knee surgery 13 yrs approx    PRECAUTIONS: None   WEIGHT BEARING RESTRICTIONS: No   FALLS:  Has patient fallen in last 6 months?  Can occasionally lose her balance   PATIENT GOALS: I want to be able to get up better improve walking and posture   OBJECTIVE: (objective measures completed at initial evaluation unless otherwise dated)   DIAGNOSTIC FINDINGS:    06/2022: Rt and Lt. LE  Severe lateral compartment narrowing with lateral and intercondylar  osteophytes was noted.  Severe patellofemoral narrowing was noted.  No  chondrocalcinosis was noted.   Impression: These findings are consistent with severe osteoarthritis and  severe chondromalacia patella.      PATIENT SURVEYS:  FOTO 60% 11/03/22 FOTO 57%  12/13/22 FOTO 54%    COGNITION: Overall cognitive status: Within functional limits for tasks assessed  SENSATION: WFL  Toes numb but not sure if she has neuropathy     EDEMA:  NT   MUSCLE LENGTH: NT    POSTURE: rounded shoulders, forward head, increased thoracic kyphosis, flexed trunk , and genu valgus   PALPATION: Pain medial compartment and TTP throughout peri-patellar   LOWER EXTREMITY ROM:   Active ROM Right eval Left eval 11/03/22 Right 11/03/22 Left 11/08/22 Rt  Hip flexion         Hip extension         Hip abduction         Hip adduction         Hip internal rotation         Hip external rotation          Knee flexion 102 108 92 P! 110 98  Knee extension 8 11     Ankle dorsiflexion         Ankle plantarflexion         Ankle inversion         Ankle eversion          (Blank rows = not tested)   LOWER EXTREMITY MMT:   MMT Right eval Left eval Rt/Lt. 11/22/22 Rt/Lt.  12/13/22  Hip flexion 4 4 4+/5, 4+/5   Hip extension        Hip abduction 3- 3- 3- 3-/5, 2+/5  Hip adduction        Hip internal rotation        Hip external rotation        Knee flexion 4+ 4+ 4+/5 pain, 4+/5    Knee extension 4+ 4+ 4+/5 pain, 4+/5   Ankle dorsiflexion 5 5    Ankle plantarflexion        Ankle inversion        Ankle eversion         (Blank rows = not tested)   LOWER EXTREMITY SPECIAL TESTS:  NT    FUNCTIONAL TESTS:  5 times sit to stand: 43 sec : 11/03/22: 30 sec   11/22/22: lacks hip extension, uses hands on thighs : 33 sec pain  30 seconds chair stand test _NT  Timed up and go (TUG): NT  2 minute walk test: EVAL: 288 feet   11/22/22: 312 feet    GAIT: Distance walked: 150 Assistive device utilized: None Level of assistance: Modified independence Comments: trunk flexed, genu valgus      TODAY'S TREATMENT:  [facility] Adult PT Treatment:                                                DATE: 12/24/22 Aquatic therapy at [address] - therapeutic pool temp approximately 92 degrees. Pt enters building ambulating independently. Treatment took place in water 3.8 to  4 ft 8 in.feet deep depending upon activity.  Pt entered and exited the pool via stair and handrails independently.  Therapeutic Exercise: Walking forward/side stepping/backward holding white barbell At edge of pool, pt performed LE exercise: Hip abd/add x20 BIL Hip extension x10 BIL Hip Circles CC/CCW x10 each BIL Squats x20 Heel raise x20 Step ups x10 BIL fwd/lat (lat harder on Rt with full size step)   Pt requires the buoyancy of water for active assisted exercises with buoyancy supported for strengthening and  AROM exercises. Hydrostatic pressure also supports joints by unweighting  joint load by at least 50 % in 3-4 feet depth water. 80% in chest to neck deep water. Water will provide assistance with movement using the current and laminar flow while the buoyancy reduces weight bearing. Pt requires the viscosity of the water for resistance with strengthening exercises.  [facility] Adult PT Treatment:                                                DATE: 12/21/22 Therapeutic Exercise: Seated LAQ 4 lbs x 15 used ball squeeze  Supine knee to chest x 3 to hip circles  Knee extension AROM with knee to chest x 10  Trunk rotation x 10  Green band double knee fall out, march and bridge  x 10 each LE Sidelying clam Rt LE no band , L LE with green band  2 min walk test with cane, adjusted for hgt  Lateral stepping at countertop no band  Added red band at thighs Then hip abd x 10 each side  x 10  Hip ext x 10 red band    [facility] Adult PT Treatment:                                                DATE: 12/17/22 Aquatic therapy at [address] - therapeutic pool temp approximately 92 degrees. Pt enters building ambulating independently. Treatment took place in water 3.8 to  4 ft 8 in.feet deep depending upon activity.  Pt entered and exited the pool via stair and handrails independently.  Therapeutic Exercise: Walking forward/side stepping holding blue barbell STS from bench to submerged step x10 At edge of pool, pt performed LE exercise: Hip abd/add x20 BIL Hip Circles CC/CCW x10 each BIL Hamstring curl x20 BIL (small ROM on Rt) Squats x20 Heel raise x20 Step ups on submerged step x10 BIL fwd/lat   Pt requires the buoyancy of water for active assisted exercises with buoyancy supported for strengthening and AROM exercises. Hydrostatic pressure also supports joints by unweighting joint load by at least 50 % in 3-4 feet depth water. 80% in chest to neck deep water. Water will provide assistance with  movement using the current and laminar flow while the buoyancy reduces weight bearing. Pt requires the viscosity of the water for resistance with strengthening exercises.                   PATIENT EDUCATION:  Education details: modified HEP, ice, follow up with MD   Person educated: Patient Education method: Explanation, Demonstration, and Handouts Education comprehension: verbalized understanding, returned demonstration, and needs further education   HOME EXERCISE PROGRAM: Access Code: [email or web address] URL: [email or web address] Date: 10/21/2022 Prepared by: Gar Ponto  Exercises - Seated Hamstring Stretch  - 1 x daily - 7 x weekly - 1 sets - 3 reps - 30 hold - Gastroc Stretch on Wall  - 1 x daily - 7 x weekly - 1 sets - 3 reps - 30 hold - Hip Flexion  - 1 x daily - 7 x weekly - 1 sets - 5 reps - 3 hold - Standing Hip Flexor Stretch  - 1 x daily - 7 x weekly - 1 sets - 3 reps - 30 hold -  Active Straight Leg Raise with Quad Set  - 1 x daily - 7 x weekly - 1 sets - 5-10 reps - 3 hold - Bridge with Hip Abduction and Resistance  - 1 x daily - 7 x weekly - 1 sets - 5-10 reps - 3 hold - Hooklying Clamshell with Resistance  - 1 x daily - 7 x weekly - 1 sets - 10 reps - 3 hold - Sit to Stand with Counter Support  - 1 x daily - 7 x weekly - 2 sets - 5 reps - Seated Long Arc Quad  - 1 x daily - 7 x weekly - 2 sets - 10 reps - 3 hold   ASSESSMENT:   CLINICAL IMPRESSION: Patient presents to aquatic PT session reporting decreased overall knee pain and still has some apprehension about getting TKA, but meets with surgeon next week. Session today focused on proximal hip and LE strengthening in the aquatic environment for use of buoyancy to offload joints and the viscosity of water as resistance during therapeutic exercise. She has shown a significant increase in comfortability in the aquatic environment since first session, even taking several steps without UE assist today. Patient was able to  tolerate all prescribed exercises in the aquatic environment. Patient continues to benefit from skilled PT services on land and aquatic based and should be progressed as able to improve functional independence.    OBJECTIVE IMPAIRMENTS: Abnormal gait, decreased activity tolerance, decreased mobility, difficulty walking, decreased ROM, decreased strength, hypomobility, increased fascial restrictions, impaired flexibility, improper body mechanics, postural dysfunction, and pain.    ACTIVITY LIMITATIONS: carrying, lifting, bending, standing, squatting, stairs, transfers, locomotion level, and caring for others   PARTICIPATION LIMITATIONS: cleaning, laundry, shopping, and community activity   PERSONAL FACTORS: Age, Time since onset of injury/illness/exacerbation, and 1-2 comorbidities: scleroderma, previous knee scopes  are also affecting patient's functional outcome.     GOALS: Goals reviewed with patient? Yes   SHORT TERM GOALS: Target date: 11/05/2022     Pt will be I with HEP for LE strengthening  Baseline: Status: Ind Goal status: MET   2.  Pt will note greater ease of sit to stand transfers due to improved knee mobility Baseline: Status: 10/21/22= Pt reports effort level for STS is the same Goal status:  ongoing     3.  Pt will be able to increase steps per day to 4000 Baseline: 2 [phone] steps  Status: 10/21/22= 4000-5000 per pt report Goal status: MET     LONG TERM GOALS: Target date: 11/19/2022     Pt will be I with final HEP  Baseline:  Goal status: ongoing    2.  FOTO score will increase to 70% or better  Baseline: 60% Goal status: ongoing    3.  Pt will be able to perform sit to stand x 5 (no UEs in < 30 sec)  Baseline: 43 sec with UE assist  Goal status: ongoing    4.  Pt will be able to complete 2 min walk test to 350 feet or more Baseline: 288 feet, 312 feet , 285 feet (12/21/22)  Goal status: ongoing    5.  Pt will increase hip strength to 4/5 to improve gait  stability and support knees  Baseline: 3-/5 Goal status: ongoing    PLAN:    PT FREQUENCY: 2x/week   PT DURATION: 8 weeks   PLANNED INTERVENTIONS: Therapeutic exercises, Therapeutic activity, Neuromuscular re-education, Balance training, Gait training, Patient/Family education, Self Care, Joint mobilization,  Stair training, Aquatic Therapy, Cryotherapy, Moist heat, Taping, Manual therapy, and Re-evaluation   PLAN FOR NEXT SESSION: NuStep, hip strength as able, tolerated. progress ROM and strength .Aquatics , schedule 1 x water 1 x land per week .    Margarette Canada PTA 12/24/22 1:19 PM

## 2022-12-24 ENCOUNTER — Ambulatory Visit: Payer: Medicare Other

## 2022-12-24 DIAGNOSIS — G8929 Other chronic pain: Secondary | ICD-10-CM | POA: Diagnosis not present

## 2022-12-24 DIAGNOSIS — M25562 Pain in left knee: Secondary | ICD-10-CM | POA: Diagnosis not present

## 2022-12-24 DIAGNOSIS — R262 Difficulty in walking, not elsewhere classified: Secondary | ICD-10-CM

## 2022-12-24 DIAGNOSIS — M25561 Pain in right knee: Secondary | ICD-10-CM | POA: Diagnosis not present

## 2022-12-24 NOTE — Therapy (Unsigned)
OUTPATIENT PHYSICAL THERAPY TREATMENT NOTE   Patient Name: Rhonda Buchanan MRN: [id number] DOB:28-Feb-1946, 77 y.o., female Today's Date: 12/24/2022  PCP: Denita Lung, MD  REFERRING PROVIDER: Estanislado Pandy, MD  END OF SESSION:          Past Medical History:  Diagnosis Date   Allergy    Arthritis    Hypercholesterolemia    Hypertension    PUD (peptic ulcer disease)    Scleroderma ([place])    Past Surgical History:  Procedure Laterality Date   arthroscopy     CYST REMOVAL HAND     EYE SURGERY Left 10/25/2017   laser surgery    KNEE ARTHROPLASTY     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/2022   Pain due to onychomycosis of toenails of both feet 08/24/2022   Callus 08/24/2022   PAD (peripheral artery disease) ([place]) 04/22/2022   Aortic atherosclerosis ([place]) 04/15/2021   High risk medication use 04/09/2020   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  Rationale for Evaluation and Treatment Rehabilitation  SUBJECTIVE:    SUBJECTIVE STATEMENT: Patient reports improved pain today. Was really sore after last session in pool. Meets with surgeon next week.     PAIN:  Are you having pain?   Yes: NPRS scale: 2/10 Lt knee 2/10 Rt knee  Pain location:knees.  Also Rt ankle/shin painful  Pain description: aching pressure  Aggravating factors: walking, stairs Relieving factors:  rest, does not use ice/heat    PERTINENT HISTORY: Scleroderma, Morphea Bilateral arthroscopic knee surgery 13 yrs approx    PRECAUTIONS: None   WEIGHT BEARING RESTRICTIONS: No   FALLS:  Has patient fallen in last 6 months?  Can occasionally lose her balance   PATIENT GOALS: I want to be able to get up better improve walking and posture   OBJECTIVE: (objective measures completed at initial evaluation unless otherwise dated)   DIAGNOSTIC FINDINGS:    06/2022: Rt and Lt. LE  Severe lateral compartment narrowing with lateral and intercondylar  osteophytes was noted.  Severe patellofemoral narrowing was noted.  No  chondrocalcinosis was noted.   Impression: These findings are consistent with severe osteoarthritis and  severe chondromalacia patella.      PATIENT SURVEYS:  FOTO 60% 11/03/22 FOTO 57%  12/13/22 FOTO 54%    COGNITION: Overall cognitive status: Within functional limits for tasks assessed                         SENSATION: WFL  Toes numb but not sure if she has neuropathy     EDEMA:  NT   MUSCLE LENGTH: NT    POSTURE: rounded shoulders, forward head, increased thoracic kyphosis, flexed trunk , and genu valgus   PALPATION: Pain medial compartment and TTP throughout peri-patellar   LOWER EXTREMITY ROM:   Active ROM Right eval Left eval 11/03/22 Right 11/03/22 Left 11/08/22 Rt  Hip flexion  Hip extension         Hip abduction         Hip adduction         Hip internal rotation         Hip external rotation         Knee flexion 102 108 92 P! 110 98  Knee extension 8 11     Ankle dorsiflexion         Ankle plantarflexion         Ankle inversion         Ankle eversion          (Blank rows = not tested)   LOWER EXTREMITY MMT:   MMT Right eval Left eval Rt/Lt. 11/22/22 Rt/Lt.  12/13/22  Hip flexion 4 4 4+/5, 4+/5   Hip extension        Hip abduction 3- 3- 3- 3-/5, 2+/5  Hip adduction        Hip internal rotation        Hip external  rotation        Knee flexion 4+ 4+ 4+/5 pain, 4+/5    Knee extension 4+ 4+ 4+/5 pain, 4+/5   Ankle dorsiflexion 5 5    Ankle plantarflexion        Ankle inversion        Ankle eversion         (Blank rows = not tested)   LOWER EXTREMITY SPECIAL TESTS:  NT    FUNCTIONAL TESTS:  5 times sit to stand: 43 sec : 11/03/22: 30 sec   11/22/22: lacks hip extension, uses hands on thighs : 33 sec pain  30 seconds chair stand test _NT  Timed up and go (TUG): NT  2 minute walk test: EVAL: 288 feet   11/22/22: 312 feet    GAIT: Distance walked: 150 Assistive device utilized: None Level of assistance: Modified independence Comments: trunk flexed, genu valgus      TODAY'S TREATMENT:    [facility] Adult PT Treatment:                                                DATE: 12/27/22 Therapeutic Exercise: *** Manual Therapy: *** Neuromuscular re-ed: *** Therapeutic Activity: *** Modalities: *** Self Care: ***  [facility] Adult PT Treatment:                                                DATE: 12/21/22 Therapeutic Exercise: Seated LAQ 4 lbs x 15 used ball squeeze  Supine knee to chest x 3 to hip circles  Knee extension AROM with knee to chest x 10  Trunk rotation x 10  Green band double knee fall out, march and bridge  x 10 each LE Sidelying clam Rt LE no band , L LE with green band  2 min walk test with cane, adjusted for hgt  Lateral stepping at countertop no band  Added red band at thighs Then hip abd x 10 each side  x 10  Hip ext x 10 red band    [facility] Adult PT Treatment:  DATE: 12/17/22 Aquatic therapy at [address] - therapeutic pool temp approximately 92 degrees. Pt enters building ambulating independently. Treatment took place in water 3.8 to  4 ft 8 in.feet deep depending upon activity.  Pt entered and exited the pool via stair and handrails independently.  Therapeutic Exercise: Walking forward/side stepping holding blue  barbell STS from bench to submerged step x10 At edge of pool, pt performed LE exercise: Hip abd/add x20 BIL Hip Circles CC/CCW x10 each BIL Hamstring curl x20 BIL (small ROM on Rt) Squats x20 Heel raise x20 Step ups on submerged step x10 BIL fwd/lat   Pt requires the buoyancy of water for active assisted exercises with buoyancy supported for strengthening and AROM exercises. Hydrostatic pressure also supports joints by unweighting joint load by at least 50 % in 3-4 feet depth water. 80% in chest to neck deep water. Water will provide assistance with movement using the current and laminar flow while the buoyancy reduces weight bearing. Pt requires the viscosity of the water for resistance with strengthening exercises.  [facility] Adult PT Treatment:                                                DATE: 12/13/22 Therapeutic Exercise: NuStep L5 UE and LE for 6 min Standing calf on slantboard x 1 x 60 sec each  Red looped band hip abd x 10 each , cues for form Knee extension with thigh supported (supine) x 10 each  SAQ , 3 lbs cuff x 20 Supine ball squeeze with alt. Knee ext x 10  Bridging x 10 knees wide  SL hip abduction with pillow between knees small ROM due to weakness 2 x 10  Prone  knee flexion x 10 (AROM Prone hip extension x 10   [facility] Adult PT Treatment:                                                DATE: 12/10/22 Aquatic therapy at [address] - therapeutic pool temp approximately 92 degrees. Pt enters building ambulating independently. Treatment took place in water 3.8 to  4 ft 8 in.feet deep depending upon activity.  Pt entered and exited the pool via stair and handrails independently.  Therapeutic Exercise: Walking forward/side stepping holding white barbell STS from bench to submerged step x10 At edge of pool, pt performed LE exercise: Hip abd/add x20 BIL Hip Circles CC/CCW x10 each BIL Hamstring curl x20 BIL (small ROM on Rt) Squats x20 Heel raise x20 Step  ups on submerged step x10 BIL Sitting on bench in water: LAQ Marching  Pt requires the buoyancy of water for active assisted exercises with buoyancy supported for strengthening and AROM exercises. Hydrostatic pressure also supports joints by unweighting joint load by at least 50 % in 3-4 feet depth water. 80% in chest to neck deep water. Water will provide assistance with movement using the current and laminar flow while the buoyancy reduces weight bearing. Pt requires the viscosity of the water for resistance with strengthening exercises.                   PATIENT EDUCATION:  Education details: modified HEP, ice, follow up with MD  Person educated: Patient Education method: Explanation, Demonstration, and Handouts Education comprehension: verbalized understanding, returned demonstration, and needs further education   HOME EXERCISE PROGRAM: Access Code: [email or web address] URL: [email or web address] Date: 10/21/2022 Prepared by: Gar Ponto  Exercises - Seated Hamstring Stretch  - 1 x daily - 7 x weekly - 1 sets - 3 reps - 30 hold - Gastroc Stretch on Wall  - 1 x daily - 7 x weekly - 1 sets - 3 reps - 30 hold - Hip Flexion  - 1 x daily - 7 x weekly - 1 sets - 5 reps - 3 hold - Standing Hip Flexor Stretch  - 1 x daily - 7 x weekly - 1 sets - 3 reps - 30 hold - Active Straight Leg Raise with Quad Set  - 1 x daily - 7 x weekly - 1 sets - 5-10 reps - 3 hold - Bridge with Hip Abduction and Resistance  - 1 x daily - 7 x weekly - 1 sets - 5-10 reps - 3 hold - Hooklying Clamshell with Resistance  - 1 x daily - 7 x weekly - 1 sets - 10 reps - 3 hold - Sit to Stand with Counter Support  - 1 x daily - 7 x weekly - 2 sets - 5 reps - Seated Long Arc Quad  - 1 x daily - 7 x weekly - 2 sets - 10 reps - 3 hold   ASSESSMENT:   CLINICAL IMPRESSION: Patient needs increased time due to slow movement  patterns and positioning for Rt knee pain. She was very sore in her glutes after the pool Friday  but did report doing her HEP at home despite that.  She continues to benefit from skilled PT to guide exercise choices and technique.  Aimed to minimize increased knee pain today by starting on the mat table. Her 2 min walk test did not meet goal today but did have less pain.    OBJECTIVE IMPAIRMENTS: Abnormal gait, decreased activity tolerance, decreased mobility, difficulty walking, decreased ROM, decreased strength, hypomobility, increased fascial restrictions, impaired flexibility, improper body mechanics, postural dysfunction, and pain.    ACTIVITY LIMITATIONS: carrying, lifting, bending, standing, squatting, stairs, transfers, locomotion level, and caring for others   PARTICIPATION LIMITATIONS: cleaning, laundry, shopping, and community activity   PERSONAL FACTORS: Age, Time since onset of injury/illness/exacerbation, and 1-2 comorbidities: scleroderma, previous knee scopes  are also affecting patient's functional outcome.     GOALS: Goals reviewed with patient? Yes   SHORT TERM GOALS: Target date: 11/05/2022     Pt will be I with HEP for LE strengthening  Baseline: Status: Ind Goal status: MET   2.  Pt will note greater ease of sit to stand transfers due to improved knee mobility Baseline: Status: 10/21/22= Pt reports effort level for STS is the same Goal status:  ongoing     3.  Pt will be able to increase steps per day to 4000 Baseline: 2 [phone] steps  Status: 10/21/22= 4000-5000 per pt report Goal status: MET     LONG TERM GOALS: Target date: 11/19/2022     Pt will be I with final HEP  Baseline:  Goal status: ongoing    2.  FOTO score will increase to 70% or better  Baseline: 60% Goal status: ongoing    3.  Pt will be able to perform sit to stand x 5 (no UEs in < 30 sec)  Baseline: 43 sec with UE assist  Goal status: ongoing  4.  Pt will be able to complete 2 min walk test to 350 feet or more Baseline: 288 feet, 312 feet , 285 feet (12/21/22)  Goal status:  ongoing    5.  Pt will increase hip strength to 4/5 to improve gait stability and support knees  Baseline: 3-/5 Goal status: ongoing    PLAN:    PT FREQUENCY: 2x/week   PT DURATION: 8 weeks   PLANNED INTERVENTIONS: Therapeutic exercises, Therapeutic activity, Neuromuscular re-education, Balance training, Gait training, Patient/Family education, Self Care, Joint mobilization, Stair training, Aquatic Therapy, Cryotherapy, Moist heat, Taping, Manual therapy, and Re-evaluation   PLAN FOR NEXT SESSION: NuStep, hip strength as able, tolerated. progress ROM and strength .Aquatics , schedule 1 x water 1 x land per week .    Raeford Razor, PT 12/24/22 12:06 PM Phone: [phone] Fax: [phone]

## 2022-12-27 ENCOUNTER — Ambulatory Visit: Payer: Medicare Other | Admitting: Physical Therapy

## 2022-12-27 ENCOUNTER — Encounter: Payer: Self-pay | Admitting: Physical Therapy

## 2022-12-27 DIAGNOSIS — M25562 Pain in left knee: Secondary | ICD-10-CM | POA: Diagnosis not present

## 2022-12-27 DIAGNOSIS — G8929 Other chronic pain: Secondary | ICD-10-CM | POA: Diagnosis not present

## 2022-12-27 DIAGNOSIS — R262 Difficulty in walking, not elsewhere classified: Secondary | ICD-10-CM | POA: Diagnosis not present

## 2022-12-27 DIAGNOSIS — M25561 Pain in right knee: Secondary | ICD-10-CM | POA: Diagnosis not present

## 2022-12-28 DIAGNOSIS — M170 Bilateral primary osteoarthritis of knee: Secondary | ICD-10-CM | POA: Diagnosis not present

## 2022-12-28 NOTE — Therapy (Unsigned)
OUTPATIENT PHYSICAL THERAPY TREATMENT NOTE DISCHARGE   Patient Name: Rhonda Buchanan MRN: [id number] DOB:09-28-1946, 77 y.o., female 29 Date: 12/29/2022  PCP: Denita Lung, MD  REFERRING PROVIDER: Estanislado Pandy, MD  END OF SESSION:   PT End of Session - 12/29/22 1410     Visit Number 24    Date for PT Re-Evaluation 01/17/23    Authorization Type [employer] Medicare    PT Start Time [id number]    PT Stop Time 1500    PT Time Calculation (min) 48 min    Activity Tolerance Patient tolerated treatment well    Behavior During Therapy WFL for tasks assessed/performed                    Past Medical History:  Diagnosis Date   Allergy    Arthritis    Hypercholesterolemia    Hypertension    PUD (peptic ulcer disease)    Scleroderma ([place])    Past Surgical History:  Procedure Laterality Date   arthroscopy     CYST REMOVAL HAND     EYE SURGERY Left 10/25/2017   laser surgery    KNEE ARTHROPLASTY     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/2022   Pain due to onychomycosis of toenails of both feet 08/24/2022   Callus 08/24/2022   PAD (peripheral artery disease) ([place]) 04/22/2022   Aortic atherosclerosis ([place]) 04/15/2021   High risk medication use 04/09/2020   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   Atherosclerosis of coronary artery of native heart without angina pectoris 10/05/2016   Aortic stenosis 05/21/2016   Abnormal PFTs 05/21/2016   Asthma due to environmental allergies 06/24/2015   Positive PPD, treated 06/05/2015   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/14/2014   Scleroderma ([place]) 07/12/2013   Hypertension 03/04/2011   Hyperlipidemia 03/04/2011   Allergic rhinitis 03/04/2011   Arthritis 03/04/2011   Migraine variant 04/18/1995    REFERRING DIAG: M17.0 (ICD-10-CM) - Primary osteoarthritis of both knees   THERAPY DIAG:  Chronic pain of left knee  Difficulty in walking, not elsewhere classified  Chronic pain of right  knee  Rationale for Evaluation and Treatment Rehabilitation  SUBJECTIVE:    SUBJECTIVE STATEMENT:   I saw the doctor yesterday and he pushed my knee back! He agreed that surgery is necessary. Will be scheduled soon.  Walks in with no cane.    PAIN:  Are you having pain?   Yes: NPRS scale: Rt knee min pain.  Lt. No pain .   Pain location:knees.  Also Rt ankle/shin painful  Pain description: aching pressure  Aggravating factors: walking, stairs Relieving factors: rest, does not use ice/heat    PERTINENT HISTORY: Scleroderma, Morphea Bilateral arthroscopic knee surgery 13 yrs approx    PRECAUTIONS: None   WEIGHT BEARING RESTRICTIONS: No   FALLS:  Has patient fallen in last 6 months?  Can occasionally lose her balance   PATIENT GOALS: I want to be able to get up better improve walking and posture   OBJECTIVE: (objective measures completed at initial evaluation unless otherwise dated)   DIAGNOSTIC FINDINGS:    06/2022: Rt and Lt. LE  Severe lateral compartment narrowing with lateral and intercondylar  osteophytes was noted.  Severe patellofemoral narrowing was noted.  No  chondrocalcinosis was noted.   Impression: These findings are consistent with severe osteoarthritis and  severe chondromalacia patella.      PATIENT SURVEYS:  FOTO 60% 11/03/22 FOTO 57%  12/13/22 FOTO 54%  12/29/22 FOTO  64%     COGNITION: Overall cognitive status: Within functional limits for tasks assessed                         SENSATION: WFL  Toes numb but not sure if she has neuropathy     EDEMA:  NT   MUSCLE LENGTH: NT    POSTURE: rounded shoulders, forward head, increased thoracic kyphosis, flexed trunk , and genu valgus   PALPATION: Pain medial compartment and TTP throughout peri-patellar   LOWER EXTREMITY ROM:   Active ROM Right eval Left eval 11/03/22 Right 11/03/22 Left 11/08/22 Rt 12/29/22 Rt, Lt.   Hip flexion          Hip extension          Hip abduction          Hip  adduction          Hip internal rotation          Hip external rotation          Knee flexion 102 108 92 P! 110 98 104, 110 deg   Knee extension 8 11    Lacks 10-15 deg bilaterally   Ankle dorsiflexion          Ankle plantarflexion          Ankle inversion          Ankle eversion           (Blank rows = not tested)   LOWER EXTREMITY MMT:   MMT Right eval Left eval Rt/Lt. 11/22/22 Rt/Lt.  12/13/22 Rt./Lt  12/29/22  Hip flexion 4 4 4+/5, 4+/5  4+/5  Hip extension         Hip abduction 3- 3- 3- 3-/5, 2+/5 4-/5, 4/-5   Hip adduction         Hip internal rotation         Hip external rotation         Knee flexion 4+ 4+ 4+/5 pain, 4+/5   5/5  Knee extension 4+ 4+ 4+/5 pain, 4+/5  5/5  Ankle dorsiflexion 5 5     Ankle plantarflexion         Ankle inversion         Ankle eversion          (Blank rows = not tested)   LOWER EXTREMITY SPECIAL TESTS:  NT    FUNCTIONAL TESTS:  5 times sit to stand: 43 sec : 11/03/22: 30 sec   11/22/22: lacks hip extension, uses hands on thighs : 33 sec pain  30 seconds chair stand test _NT  Timed up and go (TUG): NT  2 minute walk test: EVAL: 288 feet   11/22/22: 312 feet    GAIT: Distance walked: 150 Assistive device utilized: None Level of assistance: Modified independence Comments: trunk flexed, genu valgus      TODAY'S TREATMENT:   [facility] Adult PT Treatment:                                                DATE: 12/29/22 Therapeutic Exercise: NuStep L6 UE and LE for 8 min  Sidelying hip abduction 2  x10  Supine heel slides for AAROM , measurement  SLR x 15 each LE  2 min walk 333 feet  Self Care: DC,  [place], final HEP    [facility] Adult PT Treatment:                                                DATE: 12/27/22 Therapeutic Exercise: Nustep L5 UE and LE for 6 min Seated flexion blue band x 15 Seated extension blue band x 15  Seated ER with blue band x 15 IR with yoga block blue band x 15  Sit to stand high mat table x 10 Standing hip abd and  ext 4 lbs x 15 each  , each leg (high mat table)  Hamstring curl 4 lbs x 15  Self Care: 3 x per day HEP post surgical and 3 x per week PT   [facility] Adult PT Treatment:                                                DATE: 12/21/22 Therapeutic Exercise: Seated LAQ 4 lbs x 15 used ball squeeze  Supine knee to chest x 3 to hip circles  Knee extension AROM with knee to chest x 10  Trunk rotation x 10  Green band double knee fall out, march and bridge  x 10 each LE Sidelying clam Rt LE no band , L LE with green band  2 min walk test with cane, adjusted for hgt  Lateral stepping at countertop no band  Added red band at thighs Then hip abd x 10 each side  x 10  Hip ext x 10 red band    [facility] Adult PT Treatment:                                                DATE: 12/17/22 Aquatic therapy at [address] - therapeutic pool temp approximately 92 degrees. Pt enters building ambulating independently. Treatment took place in water 3.8 to  4 ft 8 in.feet deep depending upon activity.  Pt entered and exited the pool via stair and handrails independently.  Therapeutic Exercise: Walking forward/side stepping holding blue barbell STS from bench to submerged step x10 At edge of pool, pt performed LE exercise: Hip abd/add x20 BIL Hip Circles CC/CCW x10 each BIL Hamstring curl x20 BIL (small ROM on Rt) Squats x20 Heel raise x20 Step ups on submerged step x10 BIL fwd/lat   Pt requires the buoyancy of water for active assisted exercises with buoyancy supported for strengthening and AROM exercises. Hydrostatic pressure also supports joints by unweighting joint load by at least 50 % in 3-4 feet depth water. 80% in chest to neck deep water. Water will provide assistance with movement using the current and laminar flow while the buoyancy reduces weight bearing. Pt requires the viscosity of the water for resistance with strengthening exercises.  [facility] Adult PT Treatment:                                                 DATE: 12/13/22 Therapeutic Exercise: NuStep L5 UE and  LE for 6 min Standing calf on slantboard x 1 x 60 sec each  Red looped band hip abd x 10 each , cues for form Knee extension with thigh supported (supine) x 10 each  SAQ , 3 lbs cuff x 20 Supine ball squeeze with alt. Knee ext x 10  Bridging x 10 knees wide  SL hip abduction with pillow between knees small ROM due to weakness 2 x 10  Prone  knee flexion x 10 (AROM Prone hip extension x 10   [facility] Adult PT Treatment:                                                DATE: 12/10/22 Aquatic therapy at [address] - therapeutic pool temp approximately 92 degrees. Pt enters building ambulating independently. Treatment took place in water 3.8 to  4 ft 8 in.feet deep depending upon activity.  Pt entered and exited the pool via stair and handrails independently.  Therapeutic Exercise: Walking forward/side stepping holding white barbell STS from bench to submerged step x10 At edge of pool, pt performed LE exercise: Hip abd/add x20 BIL Hip Circles CC/CCW x10 each BIL Hamstring curl x20 BIL (small ROM on Rt) Squats x20 Heel raise x20 Step ups on submerged step x10 BIL Sitting on bench in water: LAQ Marching  Pt requires the buoyancy of water for active assisted exercises with buoyancy supported for strengthening and AROM exercises. Hydrostatic pressure also supports joints by unweighting joint load by at least 50 % in 3-4 feet depth water. 80% in chest to neck deep water. Water will provide assistance with movement using the current and laminar flow while the buoyancy reduces weight bearing. Pt requires the viscosity of the water for resistance with strengthening exercises.                   PATIENT EDUCATION:  Education details: modified HEP, ice, follow up with MD   Person educated: Patient Education method: Explanation, Demonstration, and Handouts Education comprehension: verbalized understanding,  returned demonstration, and needs further education   HOME EXERCISE PROGRAM: Access Code: [email or web address] URL: [email or web address] Date: 10/21/2022 Prepared by: Gar Ponto  Exercises - Seated Hamstring Stretch  - 1 x daily - 7 x weekly - 1 sets - 3 reps - 30 hold - Gastroc Stretch on Wall  - 1 x daily - 7 x weekly - 1 sets - 3 reps - 30 hold - Hip Flexion  - 1 x daily - 7 x weekly - 1 sets - 5 reps - 3 hold - Standing Hip Flexor Stretch  - 1 x daily - 7 x weekly - 1 sets - 3 reps - 30 hold - Active Straight Leg Raise with Quad Set  - 1 x daily - 7 x weekly - 1 sets - 5-10 reps - 3 hold - Bridge with Hip Abduction and Resistance  - 1 x daily - 7 x weekly - 1 sets - 5-10 reps - 3 hold - Hooklying Clamshell with Resistance  - 1 x daily - 7 x weekly - 1 sets - 10 reps - 3 hold - Sit to Stand with Counter Support  - 1 x daily - 7 x weekly - 2 sets - 5 reps - Seated Long Arc Quad  - 1 x daily - 7 x weekly - 2 sets - 10  reps - 3 hold   ASSESSMENT:   CLINICAL IMPRESSION: Patient has maximized her rehab potential and will be discharged today.  She has gained hip strength.  Knee AROM is close to what it was on her initial evaluation.  She has improved her FOTO score but not to the expected level. She continues to have significant pain at times in her knee. She plans to get TKA in the coming weeks to months.     OBJECTIVE IMPAIRMENTS: Abnormal gait, decreased activity tolerance, decreased mobility, difficulty walking, decreased ROM, decreased strength, hypomobility, increased fascial restrictions, impaired flexibility, improper body mechanics, postural dysfunction, and pain.    ACTIVITY LIMITATIONS: carrying, lifting, bending, standing, squatting, stairs, transfers, locomotion level, and caring for others   PARTICIPATION LIMITATIONS: cleaning, laundry, shopping, and community activity   PERSONAL FACTORS: Age, Time since onset of injury/illness/exacerbation, and 1-2 comorbidities:  scleroderma, previous knee scopes  are also affecting patient's functional outcome.     GOALS: Goals reviewed with patient? Yes   SHORT TERM GOALS: Target date: 11/05/2022     Pt will be I with HEP for LE strengthening  Baseline: Status: Ind Goal status: MET   2.  Pt will note greater ease of sit to stand transfers due to improved knee mobility Baseline: Status: 10/21/22= Pt reports effort level for STS is the same, slight improvement  Goal status: partially met    3.  Pt will be able to increase steps per day to 4000 Baseline: 2 [phone] steps  Status: 10/21/22= 4000-5000 per pt report Goal status: MET     LONG TERM GOALS: Target date: 11/19/2022     Pt will be I with final HEP  Baseline:  Goal status:MET    2.  FOTO score will increase to 70% or better  Baseline: 60%, 64% DC  Goal status: partially met   3.  Pt will be able to perform sit to stand x 5 (no UEs in < 30 sec)  Baseline: 43 sec with UE assist  Goal status: MET    4.  Pt will be able to complete 2 min walk test to 350 feet or more Baseline: 288 feet, 312 feet , 285 feet (12/21/22) , 333 feet on DC  Goal status: partially met    5.  Pt will increase hip strength to 4/5 to improve gait stability and support knees  Baseline: 3-/5, DC 4-/5  Goal status: partially met    PLAN:    PT FREQUENCY: 2x/week   PT DURATION: 8 weeks   PLANNED INTERVENTIONS: Therapeutic exercises, Therapeutic activity, Neuromuscular re-education, Balance training, Gait training, Patient/Family education, Self Care, Joint mobilization, Stair training, Aquatic Therapy, Cryotherapy, Moist heat, Taping, Manual therapy, and Re-evaluation   PLAN FOR NEXT SESSION: NA    Raeford Razor, PT 12/29/22 3:26 PM Phone: [phone] Fax: [phone]   PHYSICAL THERAPY DISCHARGE SUMMARY  Visits from Start of Care: 24  Current functional level related to goals / functional outcomes: See above    Remaining deficits: Hip weakness and knee ROM,  pain , gait    Education / Equipment: HEP, iceheat, cane , posture   Patient agrees to discharge. Patient goals were partially met. Patient is being discharged due to maximized rehab potential.    Raeford Razor, PT 12/29/22 3:26 PM Phone: [phone] Fax: [phone]

## 2022-12-29 ENCOUNTER — Ambulatory Visit: Payer: Medicare Other | Admitting: Physical Therapy

## 2022-12-29 DIAGNOSIS — M25561 Pain in right knee: Secondary | ICD-10-CM | POA: Diagnosis not present

## 2022-12-29 DIAGNOSIS — G8929 Other chronic pain: Secondary | ICD-10-CM

## 2022-12-29 DIAGNOSIS — R262 Difficulty in walking, not elsewhere classified: Secondary | ICD-10-CM | POA: Diagnosis not present

## 2022-12-29 DIAGNOSIS — M25562 Pain in left knee: Secondary | ICD-10-CM | POA: Diagnosis not present

## 2023-01-04 ENCOUNTER — Telehealth: Payer: Self-pay | Admitting: Family Medicine

## 2023-01-04 ENCOUNTER — Telehealth: Payer: Self-pay | Admitting: *Deleted

## 2023-01-04 ENCOUNTER — Encounter: Payer: Self-pay | Admitting: Family Medicine

## 2023-01-04 ENCOUNTER — Ambulatory Visit (INDEPENDENT_AMBULATORY_CARE_PROVIDER_SITE_OTHER): Payer: Medicare Other | Admitting: Family Medicine

## 2023-01-04 VITALS — BP 136/70 | HR 72 | Temp 97.9°F | Resp 14 | Ht 64.75 in | Wt 166.0 lb

## 2023-01-04 DIAGNOSIS — Z01818 Encounter for other preprocedural examination: Secondary | ICD-10-CM

## 2023-01-04 DIAGNOSIS — M349 Systemic sclerosis, unspecified: Secondary | ICD-10-CM | POA: Diagnosis not present

## 2023-01-04 DIAGNOSIS — Z79899 Other long term (current) drug therapy: Secondary | ICD-10-CM

## 2023-01-04 DIAGNOSIS — I739 Peripheral vascular disease, unspecified: Secondary | ICD-10-CM

## 2023-01-04 DIAGNOSIS — I700 Atherosclerosis of aorta: Secondary | ICD-10-CM

## 2023-01-04 NOTE — Telephone Encounter (Signed)
Form for Surgery clearance & last office note faxed to [facility]

## 2023-01-04 NOTE — Telephone Encounter (Signed)
   Pre-operative Risk Assessment    Patient Name: Rhonda Buchanan  DOB: December 02, 1945 MRN: [id number]      Request for Surgical Clearance    Procedure:   LEFT TOTAL KNEE ARTHROPLASTY  Date of Surgery:  Clearance TBD                                 Surgeon:  DR. Rod Can Surgeon's Group or Practice Name:  [facility] Phone number:  [phone] ATTN: KERRI MAZE Fax number:  [phone]   Type of Clearance Requested:   - Medical ; NO MEDICATIONS LISTED AS NEEDING TO BE HELD   Type of Anesthesia:  Spinal   Additional requests/questions:    Jiles Prows   01/04/2023, 5:33 PM

## 2023-01-04 NOTE — Progress Notes (Signed)
   Subjective:    Patient ID: Rhonda Buchanan, female    DOB: 06/12/1946, 77 y.o.   MRN: [id number]  HPI She is here for preoperative evaluation prior to right TKA.  She does have underlying scleroderma and has been on methotrexate.  She also has PAD and has been seen by vascular as well as cardiology.  She will be scheduled to get a follow-up appoint with cardiology prior to her surgery.  Presently she is having no chest pain, shortness of breath, trouble breathing.  She has had a previous CT scan of her lungs which was essentially normal.  Her medications were reviewed.   Review of Systems     Objective:   Physical Exam Alert and in no distress. Tympanic membranes and canals are normal. Pharyngeal area is normal. Neck is supple without adenopathy or thyromegaly. Cardiac exam shows a regular sinus rhythm without murmurs or gallops. Lungs are clear to auscultation.        Assessment & Plan:  Pre-op evaluation  High risk medication use  Aortic atherosclerosis ([facility])  PAD (peripheral artery disease) ([place])  Scleroderma ([place]) She will need to be cleared by cardiology prior to surgery.  She should also hold the methotrexate for roughly 2 weeks prior to surgery.  Discussed postoperative care to minimize possibility of DVT and also the need for knee rehab.

## 2023-01-05 ENCOUNTER — Telehealth: Payer: Self-pay

## 2023-01-05 NOTE — Telephone Encounter (Signed)
  Patient Consent for Virtual Visit         Rhonda Buchanan has provided verbal consent on 01/05/2023 for a virtual visit (video or telephone).   CONSENT FOR VIRTUAL VISIT FOR:  Rhonda Buchanan  By participating in this virtual visit I agree to the following:  I hereby voluntarily request, consent and authorize [facility] and its employed or contracted physicians, physician assistants, nurse practitioners or other licensed health care professionals (the Practitioner), to provide me with telemedicine health care services (the "Services") as deemed necessary by the treating Practitioner. I acknowledge and consent to receive the Services by the Practitioner via telemedicine. I understand that the telemedicine visit will involve communicating with the Practitioner through live audiovisual communication technology and the disclosure of certain medical information by electronic transmission. I acknowledge that I have been given the opportunity to request an in-person assessment or other available alternative prior to the telemedicine visit and am voluntarily participating in the telemedicine visit.  I understand that I have the right to withhold or withdraw my consent to the use of telemedicine in the course of my care at any time, without affecting my right to future care or treatment, and that the Practitioner or I may terminate the telemedicine visit at any time. I understand that I have the right to inspect all information obtained and/or recorded in the course of the telemedicine visit and may receive copies of available information for a reasonable fee.  I understand that some of the potential risks of receiving the Services via telemedicine include:  Delay or interruption in medical evaluation due to technological equipment failure or disruption; Information transmitted may not be sufficient (e.g. poor resolution of images) to allow for appropriate medical decision making by the  Practitioner; and/or  In rare instances, security protocols could fail, causing a breach of personal health information.  Furthermore, I acknowledge that it is my responsibility to provide information about my medical history, conditions and care that is complete and accurate to the best of my ability. I acknowledge that Practitioner's advice, recommendations, and/or decision may be based on factors not within their control, such as incomplete or inaccurate data provided by me or distortions of diagnostic images or specimens that may result from electronic transmissions. I understand that the practice of medicine is not an exact science and that Practitioner makes no warranties or guarantees regarding treatment outcomes. I acknowledge that a copy of this consent can be made available to me via my patient portal ([facility]), or I can request a printed copy by calling the office of [facility].    I understand that my insurance will be billed for this visit.   I have read or had this consent read to me. I understand the contents of this consent, which adequately explains the benefits and risks of the Services being provided via telemedicine.  I have been provided ample opportunity to ask questions regarding this consent and the Services and have had my questions answered to my satisfaction. I give my informed consent for the services to be provided through the use of telemedicine in my medical care

## 2023-01-05 NOTE — Telephone Encounter (Signed)
   Name: Rhonda Buchanan  DOB: 1946-05-29  MRN: [id number]  Primary Cardiologist: Glori Bickers, MD   Preoperative team, please contact this patient and set up a phone call appointment for further preoperative risk assessment. Please obtain consent and complete medication review. Thank you for your help.    Mayra Reel, NP 01/05/2023, 8:38 AM [place]

## 2023-01-05 NOTE — Telephone Encounter (Signed)
Patient scheduled for tele visit on 01/13/23. Med rec and consent done

## 2023-01-13 ENCOUNTER — Ambulatory Visit: Payer: Medicare Other

## 2023-01-19 NOTE — Telephone Encounter (Signed)
Faxed to [facility]

## 2023-01-24 NOTE — Telephone Encounter (Addendum)
     Primary Cardiologist: Arvilla Meres, MD  Chart reviewed as part of pre-operative protocol coverage. Given past medical history and time since last visit, based on ACC/AHA guidelines, Rhonda Buchanan would be at acceptable risk for the planned procedure without further cardiovascular testing.   Her RCRI is a class II risk, 0.9% risk of major cardiac event.  I will route this recommendation to the requesting party via Epic fax function and remove from pre-op pool.  Please call with questions.  Thomasene Ripple. Sidonia Nutter NP-C     01/24/2023, 3:43 PM [facility] [address] Office [phone] Fax [phone]

## 2023-02-23 ENCOUNTER — Ambulatory Visit: Payer: Medicare Other | Admitting: Podiatry

## 2023-02-23 ENCOUNTER — Encounter: Payer: Self-pay | Admitting: Podiatry

## 2023-02-23 DIAGNOSIS — M79674 Pain in right toe(s): Secondary | ICD-10-CM | POA: Diagnosis not present

## 2023-02-23 DIAGNOSIS — Q828 Other specified congenital malformations of skin: Secondary | ICD-10-CM | POA: Diagnosis not present

## 2023-02-23 DIAGNOSIS — M79675 Pain in left toe(s): Secondary | ICD-10-CM | POA: Diagnosis not present

## 2023-02-23 DIAGNOSIS — B351 Tinea unguium: Secondary | ICD-10-CM | POA: Diagnosis not present

## 2023-02-23 NOTE — Progress Notes (Signed)
This patient returns to my office for at risk foot care.  This patient requires this care by a professional since this patient will be at risk due to having  PAD.  This patient is unable to cut nails herself since the patient cannot reach her nails.These nails are painful walking and wearing shoes.  This patient presents for at risk foot care today.  General Appearance  Alert, conversant and in no acute stress.  Vascular  Dorsalis pedis and posterior tibial  pulses are palpable  bilaterally.  Capillary return is within normal limits  bilaterally. Temperature is within normal limits  bilaterally.  Neurologic  Senn-Weinstein monofilament wire test within normal limits  bilaterally. Muscle power within normal limits bilaterally.  Nails Thick disfigured discolored nails with subungual debris  from hallux to fifth toes bilaterally. No evidence of bacterial infection or drainage bilaterally.  Orthopedic  No limitations of motion  feet .  No crepitus or effusions noted.  Hammer toes 2-5  B/L.  Contracted 5th digit  B/L.  Skin  normotropic skin with no porokeratosis noted bilaterally.  No signs of infections or ulcers noted.   Callus sub 5th  B/L.  Onychomycosis  Pain in right toes  Pain in left toes  Porokeratosis   B/L.  Consent was obtained for treatment procedures.   Mechanical debridement of nails 1-5  bilaterally performed with a nail nipper.  Filed with dremel without incident. Debride callus sub 5th met   with dremel tool.  B/L.   Return office visit  3 months                    Told patient to return for periodic foot care and evaluation due to potential at risk complications.   Helane Gunther DPM

## 2023-02-28 NOTE — Progress Notes (Signed)
Sent message, via epic in basket, requesting orders in epic from surgeon.  

## 2023-03-02 ENCOUNTER — Ambulatory Visit: Payer: Self-pay | Admitting: Student

## 2023-03-02 NOTE — Progress Notes (Addendum)
CRITICAL RESULT PROVIDER NOTIFICATION  Test performed and critical result:  Potassium 2.7 on 03/07/2023   Date and time result received: 03/07/2023 1439  Provider name/title: Cyndia Diver rn   Date and time provider notified 03/07/2023 1440 Christeen Douglas, [facility]  Routed to Dr Samson Frederic on 03/07/23 at 1439pm   Date and time provider responded: 03/07/23 Leticia Clas 1440pm   Provider Response. Leticia Clas has sent labs to DR [facility],  Called office and also called office of PCP .   Anesthesia Review:  PCP: DR Thad Ranger 01/04/23  on chart clearance 01/04/23 on chart  Cardiologist : DR Nicholes Mango LOV 08/12/22 Clearance on chart dated  01/19/23  DE Deveshwar clearance on chart dated 12/31/22  Chest x-ray : EKG :08/12/22  Echo :08/12/22  PFT- 08/12/22  Stress test: Cardiac Cath :  Activity level: can do a flight of stairs without difficulty  Sleep Study/ CPAP : none  Fasting Blood Sugar :      / Checks Blood Sugar -- times a day:   Blood Thinner/ Instructions /Last Dose: ASA / Instructions/ Last Dose :

## 2023-03-02 NOTE — Patient Instructions (Signed)
SURGICAL WAITING ROOM VISITATION  Patients having surgery or a procedure may have no more than 2 support people in the waiting area - these visitors may rotate.    Children under the age of 59 must have an adult with them who is not the patient.  Due to an increase in RSV and influenza rates and associated hospitalizations, children ages 40 and under may not visit patients in [facility] hospitals.  If the patient needs to stay at the hospital during part of their recovery, the visitor guidelines for inpatient rooms apply. Pre-op nurse will coordinate an appropriate time for 1 support person to accompany patient in pre-op.  This support person may not rotate.    Please refer to the [facility] website for the visitor guidelines for Inpatients (after your surgery is over and you are in a regular room).       Your procedure is scheduled on:  03/17/2023    Report to [facility] Main Entrance    Report to admitting at  0800 AM   Call this number if you have problems the morning of surgery [phone]   Do not eat food :After Midnight.   After Midnight you may have the following liquids until ___ 0715___ AM  DAY OF SURGERY  Water Non-Citrus Juices (without pulp, NO RED-Apple, White grape, White cranberry) Black Coffee (NO MILK/CREAM OR CREAMERS, sugar ok)  Clear Tea (NO MILK/CREAM OR CREAMERS, sugar ok) regular and decaf                             Plain Jell-O (NO RED)                                           Fruit ices (not with fruit pulp, NO RED)                                     Popsicles (NO RED)                                                               Sports drinks like Gatorade (NO RED)                     The day of surgery:  Drink ONE (1) Pre-Surgery Clear Ensure or G2 at  0715AM the morning of surgery. Drink in one sitting. Do not sip.  This drink was given to you during your hospital  pre-op appointment visit. Nothing else to drink after completing  the  Pre-Surgery Clear Ensure or G2.          If you have questions, please contact your surgeon's office.      Oral Hygiene is also important to reduce your risk of infection.                                    Remember - BRUSH YOUR TEETH THE MORNING OF SURGERY WITH YOUR REGULAR TOOTHPASTE  DENTURES WILL  BE REMOVED PRIOR TO SURGERY PLEASE DO NOT APPLY "Poly grip" OR ADHESIVES!!!   Do NOT smoke after Midnight   Take these medicines the morning of surgery with A SIP OF WATER:  eye drops , toprol   DO NOT TAKE ANY ORAL DIABETIC MEDICATIONS DAY OF YOUR SURGERY  Bring CPAP mask and tubing day of surgery.                              You may not have any metal on your body including hair pins, jewelry, and body piercing             Do not wear make-up, lotions, powders, perfumes/cologne, or deodorant  Do not wear nail polish including gel and S&S, artificial/acrylic nails, or any other type of covering on natural nails including finger and toenails. If you have artificial nails, gel coating, etc. that needs to be removed by a nail salon please have this removed prior to surgery or surgery may need to be canceled/ delayed if the surgeon/ anesthesia feels like they are unable to be safely monitored.   Do not shave  48 hours prior to surgery.               Men may shave face and neck.   Do not bring valuables to the hospital. [facility] IS NOT             RESPONSIBLE   FOR VALUABLES.   Contacts, glasses, dentures or bridgework may not be worn into surgery.   Bring small overnight bag day of surgery.   DO NOT BRING YOUR HOME MEDICATIONS TO THE HOSPITAL. PHARMACY WILL DISPENSE MEDICATIONS LISTED ON YOUR MEDICATION LIST TO YOU DURING YOUR ADMISSION IN THE HOSPITAL!    Patients discharged on the day of surgery will not be allowed to drive home.  Someone NEEDS to stay with you for the first 24 hours after anesthesia.   Special Instructions: Bring a copy of your healthcare power of  attorney and living will documents the day of surgery if you haven't scanned them before.              Please read over the following fact sheets you were given: IF YOU HAVE QUESTIONS ABOUT YOUR PRE-OP INSTRUCTIONS PLEASE CALL [phone]   If you received a COVID test during your pre-op visit  it is requested that you wear a mask when out in public, stay away from anyone that may not be feeling well and notify your surgeon if you develop symptoms. If you test positive for Covid or have been in contact with anyone that has tested positive in the last 10 days please notify you surgeon.      Pre-operative 5 CHG Bath Instructions   You can play a key role in reducing the risk of infection after surgery. Your skin needs to be as free of germs as possible. You can reduce the number of germs on your skin by washing with CHG (chlorhexidine gluconate) soap before surgery. CHG is an antiseptic soap that kills germs and continues to kill germs even after washing.   DO NOT use if you have an allergy to chlorhexidine/CHG or antibacterial soaps. If your skin becomes reddened or irritated, stop using the CHG and notify one of our RNs at [phone].   Please shower with the CHG soap starting 4 days before surgery using the following schedule:     Please keep in  mind the following:  DO NOT shave, including legs and underarms, starting the day of your first shower.   You may shave your face at any point before/day of surgery.  Place clean sheets on your bed the day you start using CHG soap. Use a clean washcloth (not used since being washed) for each shower. DO NOT sleep with pets once you start using the CHG.   CHG Shower Instructions:  If you choose to wash your hair and private area, wash first with your normal shampoo/soap.  After you use shampoo/soap, rinse your hair and body thoroughly to remove shampoo/soap residue.  Turn the water OFF and apply about 3 tablespoons (45 ml) of CHG soap to a  CLEAN washcloth.  Apply CHG soap ONLY FROM YOUR NECK DOWN TO YOUR TOES (washing for 3-5 minutes)  DO NOT use CHG soap on face, private areas, open wounds, or sores.  Pay special attention to the area where your surgery is being performed.  If you are having back surgery, having someone wash your back for you may be helpful. Wait 2 minutes after CHG soap is applied, then you may rinse off the CHG soap.  Pat dry with a clean towel  Put on clean clothes/pajamas   If you choose to wear lotion, please use ONLY the CHG-compatible lotions on the back of this paper.     Additional instructions for the day of surgery: DO NOT APPLY any lotions, deodorants, cologne, or perfumes.   Put on clean/comfortable clothes.  Brush your teeth.  Ask your nurse before applying any prescription medications to the skin.      CHG Compatible Lotions   Aveeno Moisturizing lotion  Cetaphil Moisturizing Cream  Cetaphil Moisturizing Lotion  Clairol Herbal Essence Moisturizing Lotion, Dry Skin  Clairol Herbal Essence Moisturizing Lotion, Extra Dry Skin  Clairol Herbal Essence Moisturizing Lotion, Normal Skin  Curel Age Defying Therapeutic Moisturizing Lotion with Alpha Hydroxy  Curel Extreme Care Body Lotion  Curel Soothing Hands Moisturizing Hand Lotion  Curel Therapeutic Moisturizing Cream, Fragrance-Free  Curel Therapeutic Moisturizing Lotion, Fragrance-Free  Curel Therapeutic Moisturizing Lotion, Original Formula  Eucerin Daily Replenishing Lotion  Eucerin Dry Skin Therapy Plus Alpha Hydroxy Crme  Eucerin Dry Skin Therapy Plus Alpha Hydroxy Lotion  Eucerin Original Crme  Eucerin Original Lotion  Eucerin Plus Crme Eucerin Plus Lotion  Eucerin TriLipid Replenishing Lotion  Keri Anti-Bacterial Hand Lotion  Keri Deep Conditioning Original Lotion Dry Skin Formula Softly Scented  Keri Deep Conditioning Original Lotion, Fragrance Free Sensitive Skin Formula  Keri Lotion Fast Absorbing Fragrance Free  Sensitive Skin Formula  Keri Lotion Fast Absorbing Softly Scented Dry Skin Formula  Keri Original Lotion  Keri Skin Renewal Lotion Keri Silky Smooth Lotion  Keri Silky Smooth Sensitive Skin Lotion  Nivea Body Creamy Conditioning Oil  Nivea Body Extra Enriched Teacher, adult education Moisturizing Lotion Nivea Crme  Nivea Skin Firming Lotion  NutraDerm 30 Skin Lotion  NutraDerm Skin Lotion  NutraDerm Therapeutic Skin Cream  NutraDerm Therapeutic Skin Lotion  ProShield Protective Hand Cream  Provon moisturizing lotion

## 2023-03-07 ENCOUNTER — Telehealth: Payer: Self-pay

## 2023-03-07 ENCOUNTER — Other Ambulatory Visit: Payer: Self-pay | Admitting: Medical

## 2023-03-07 ENCOUNTER — Other Ambulatory Visit: Payer: Self-pay

## 2023-03-07 ENCOUNTER — Encounter (HOSPITAL_COMMUNITY)
Admission: RE | Admit: 2023-03-07 | Discharge: 2023-03-07 | Disposition: A | Discharge status: Home / Self Care | Payer: Medicare Other | Source: Ambulatory Visit | Attending: Orthopedic Surgery | Admitting: Orthopedic Surgery

## 2023-03-07 ENCOUNTER — Encounter (HOSPITAL_COMMUNITY): Payer: Self-pay

## 2023-03-07 VITALS — BP 122/59 | HR 62 | Temp 98.2°F | Resp 16 | Ht 65.5 in | Wt 163.0 lb

## 2023-03-07 DIAGNOSIS — Z01818 Encounter for other preprocedural examination: Secondary | ICD-10-CM | POA: Diagnosis not present

## 2023-03-07 HISTORY — DX: Nausea with vomiting, unspecified: R11.2

## 2023-03-07 HISTORY — DX: Nausea with vomiting, unspecified: Z98.890

## 2023-03-07 HISTORY — DX: Unspecified asthma, uncomplicated: J45.909

## 2023-03-07 LAB — CBC
HCT: 38.4 % (ref 36.0–46.0)
Hemoglobin: 13 g/dL (ref 12.0–15.0)
MCH: 31.9 pg (ref 26.0–34.0)
MCHC: 33.9 g/dL (ref 30.0–36.0)
MCV: 94.1 fL (ref 80.0–100.0)
Platelets: 196 10*3/uL (ref 150–400)
RBC: 4.08 MIL/uL (ref 3.87–5.11)
RDW: 14.6 % (ref 11.5–15.5)
WBC: 4.6 10*3/uL (ref 4.0–10.5)
nRBC: 0 % (ref 0.0–0.2)

## 2023-03-07 LAB — BASIC METABOLIC PANEL
Anion gap: 8 (ref 5–15)
BUN: 11 mg/dL (ref 8–23)
CO2: 27 mmol/L (ref 22–32)
Calcium: 9.2 mg/dL (ref 8.9–10.3)
Chloride: 101 mmol/L (ref 98–111)
Creatinine, Ser: 1.2 mg/dL — ABNORMAL HIGH (ref 0.44–1.00)
GFR, Estimated: 46 mL/min — ABNORMAL LOW (ref >60)
Glucose, Bld: 106 mg/dL — ABNORMAL HIGH (ref 70–99)
Potassium: 2.7 mmol/L — CL (ref 3.5–5.1)
Sodium: 136 mmol/L (ref 135–145)

## 2023-03-07 LAB — SURGICAL PCR SCREEN
MRSA, PCR: NEGATIVE
Staphylococcus aureus: NEGATIVE

## 2023-03-07 MED ORDER — POTASSIUM CHLORIDE ER 10 MEQ PO TBCR: 10.0000 meq | EXTENDED_RELEASE_TABLET | Freq: Two times a day (BID) | ORAL | 0 refills | Status: DC

## 2023-03-07 NOTE — Telephone Encounter (Signed)
Pt called to have a rx sent in to increase potassium and I advised she would need an appt and she states that she was told she would just need a rx. Pt was advise I would send a note back and someone would let her know if an appt was needed or not. Pt uses [facility] on [address] and can be reached at [phone].

## 2023-03-07 NOTE — Telephone Encounter (Signed)
A nurse from hospital called to report a critical value on Potassium level of 2.7. They said it needed to be addressed before her knee surgery on 03/17/23.

## 2023-03-08 ENCOUNTER — Telehealth: Payer: Self-pay | Admitting: Family Medicine

## 2023-03-08 DIAGNOSIS — M1711 Unilateral primary osteoarthritis, right knee: Secondary | ICD-10-CM | POA: Diagnosis not present

## 2023-03-08 NOTE — Progress Notes (Signed)
Office Visit Note  Patient: Rhonda Buchanan             Date of Birth: Nov 27, 1945           MRN: [id number]             PCP: Ronnald Nian, MD Referring: Ronnald Nian, MD Visit Date: 03/22/2023 Occupation: @GUAROCC @  Subjective:  Pain in both knees  History of Present Illness: Rhonda Buchanan is a 77 y.o. 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  She returns today after her last visit on October 19, 2022.  Patient states she was scheduled for the right total knee replacement by Dr. Linna Caprice.  On her preop labs her potassium was low.  She was advised to increase potassium intake.  On the day of surgery she was noticed to have some swelling on her face.  She was advised to see dentist.  The surgery was canceled.  She had a cavity but did not have any infection.  She was given amoxicillin by her PCP due to swollen face.  She will have to reschedule surgery now.  She has been off methotrexate because she is on antibiotics.  She is also been off methotrexate for the surgery.  She has not noticed any change in morphea.  Raynauds currently not active.  She had no recent episodes of iridocyclitis.  She denies any joint pain or inflammation.  She continues to have pain and discomfort in both knee joints.    Activities of Daily Living:  Patient reports morning stiffness for 0 minutes.   Patient Denies nocturnal pain.  Difficulty dressing/grooming: Denies Difficulty climbing stairs: Reports Difficulty getting out of chair: Reports Difficulty using hands for taps, buttons, cutlery, and/or writing: Reports  Review of Systems  Constitutional:  Positive for fatigue.  HENT:  Positive for mouth sores. Negative for mouth dryness.   Eyes:  Negative for dryness.  Respiratory:  Negative for shortness of breath.   Cardiovascular:  Negative for chest pain and palpitations.  Gastrointestinal:  Positive for diarrhea. Negative for blood in stool and constipation.  Endocrine: Negative for increased  urination.  Genitourinary:  Negative for involuntary urination.  Musculoskeletal:  Positive for joint pain and joint pain. Negative for gait problem, joint swelling, myalgias, muscle weakness, morning stiffness, muscle tenderness and myalgias.  Skin:  Positive for hair loss. Negative for color change, rash and sensitivity to sunlight.  Allergic/Immunologic: Negative for susceptible to infections.  Neurological:  Negative for dizziness, numbness and headaches.  Hematological:  Negative for swollen glands.  Psychiatric/Behavioral:  Negative for depressed mood and sleep disturbance. The patient is not nervous/anxious.     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   Scleroderma (HCC) 07/12/2013   Hypertension 03/04/2011   Hyperlipidemia 03/04/2011   Allergic rhinitis 03/04/2011   Arthritis 03/04/2011   Migraine variant 04/18/1995    Past Medical History:  Diagnosis Date   Allergy  Arthritis    Asthma    Hypercholesterolemia    Hypertension    PONV (postoperative nausea and vomiting)    PUD (peptic ulcer disease)    Scleroderma (HCC)     Family History  Problem Relation Age of Onset   Cancer Father    Ulcers Sister    High Cholesterol Sister    Ulcers Brother    Heart disease Brother 45       MI   Cancer Maternal Grandmother    Heart disease Maternal Grandfather    Past Surgical History:   Procedure Laterality Date   arthroscopy     CYST REMOVAL HAND     EYE SURGERY Left 10/25/2017   laser surgery    KNEE ARTHROPLASTY     Social History   Social History Narrative   Not on file   Immunization History  Administered Date(s) Administered   COVID-19, mRNA, vaccine(Comirnaty)12 years and older 08/15/2022   Fluad Quad(high Dose 65+) 12/19/2020, 09/29/2021, 08/15/2022   H1N1 08/22/2009   Influenza, High Dose Seasonal PF 06/24/2015, 10/05/2016, 08/04/2018   Influenza,inj,Quad PF,6+ Mos 07/12/2013, 01/15/2015   Influenza-Unspecified 06/24/2015, 10/05/2016, 12/24/2017, 08/04/2018   PFIZER(Purple Top)SARS-COV-2 Vaccination 12/13/2019, 01/08/2020, 10/03/2020   Pfizer Covid-19 Vaccine Bivalent Booster 60yrs & up 09/29/2021   Pneumococcal Conjugate-13 05/20/2014   Pneumococcal Polysaccharide-23 01/31/2007   Tdap 01/31/2007, 12/19/2020   Zoster, Live 07/02/2008     Objective: Vital Signs: BP 125/72 (BP Location: Left Arm, Patient Position: Sitting, Cuff Size: Normal)   Pulse 71   Resp 14   Ht 5' 5.5" (1.664 m)   Wt 169 lb 3.2 oz (76.7 kg)   LMP 02/16/2003   BMI 27.73 kg/m    Physical Exam Vitals and nursing note reviewed.  Constitutional:      Appearance: She is well-developed.  HENT:     Head: Normocephalic and atraumatic.  Eyes:     Conjunctiva/sclera: Conjunctivae normal.  Cardiovascular:     Rate and Rhythm: Normal rate and regular rhythm.     Heart sounds: Normal heart sounds.  Pulmonary:     Effort: Pulmonary effort is normal.     Breath sounds: Normal breath sounds.  Abdominal:     General: Bowel sounds are normal.     Palpations: Abdomen is soft.  Musculoskeletal:     Cervical back: Normal range of motion.  Lymphadenopathy:     Cervical: No cervical adenopathy.  Skin:    General: Skin is warm and dry.     Capillary Refill: Capillary refill takes less than 2 seconds.     Comments: She had good capillary refill without any sclerodactyly, nailbed  capillary changes or digital ulcers.  She had hypopigmentation on her abdomen and back from previous morphea lesions.  No new lesions were noted.  Neurological:     Mental Status: She is alert and oriented to person, place, and time.  Psychiatric:        Behavior: Behavior normal.      Musculoskeletal Exam: Cervical spine was in good range of motion.  She had discomfort range of motion lumbar spine.  Shoulders, elbows, wrist joints were in good range of motion.  She had no synovitis over MCPs.  She had bilateral CMC PIP and DIP thickening.  Hip joints were in good range of motion.  She had limited extension of her bilateral knee joints without any warmth swelling or effusion.  There was no tenderness over ankles or MTPs.  CDAI Exam: CDAI Score: -- Patient Global: --;  Provider Global: -- Swollen: --; Tender: -- Joint Exam 03/22/2023   No joint exam has been documented for this visit   There is currently no information documented on the homunculus. Go to the Rheumatology activity and complete the homunculus joint exam.  Investigation: No additional findings.  Imaging: No results found.  Recent Labs: Lab Results  Component Value Date   WBC 4.6 03/07/2023   HGB 13.0 03/07/2023   PLT 196 03/07/2023   NA 140 03/21/2023   K 3.2 (L) 03/21/2023   CL 101 03/21/2023   CO2 25 03/21/2023   GLUCOSE 92 03/21/2023   BUN 8 03/21/2023   CREATININE 1.15 (H) 03/21/2023   BILITOT 0.6 10/19/2022   ALKPHOS 73 07/03/2021   AST 29 10/19/2022   ALT 25 10/19/2022   PROT 7.3 10/19/2022   ALBUMIN 4.3 07/03/2021   CALCIUM 10.2 03/21/2023   GFRAA 62 08/01/2020    Speciality Comments: No specialty comments available.  Procedures:  No procedures performed Allergies: Aspirin and Ibuprofen   Assessment / Plan:     Visit Diagnoses: Scleroderma (HCC) - History of morphea, Raynauds, recurrent iridocyclitis.  Patient denies any Raynauds currently.  She has been noticing Dr. Janyth Contes for morphea.  She  denies any new lesions.  She denies any recent episodes of iridocyclitis.  She denies any history of inflammatory arthritis.  She has been off methotrexate due to scheduled surgery for right total knee replacement.  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 Rema Jasmine progression.  She had hyperpigmented lesions on her abdomen and back.  High risk medication use - Methotrexate 6 tablets by mouth once weekly and folic acid 2 mg p.o. daily.  Patient gets labs monitored through her PCP and dermatologist.  Mar 15, 2023 CBC was normal.  She had BMP on March 21, 2023 which showed creatinine of 1.15 which has been stable.  Potassium was low at 3.2.  She was recently given potassium by her PCP.  LFTs were normal in January 2024.  She will be getting LFTs again in July per patient.  Information on immunization was placed in the AVS.  She was advised to hold methotrexate if she develops an infection.  Patient is currently holding methotrexate because she developed infection in her mouth.  She also is holding methotrexate due to upcoming surgery for right total knee replacement.  Positive PPD, treated  Raynaud's syndrome without gangrene-she had good capillary refill.  No nailbed capillary changes were noted.  No sclerodactyly was noted.  She states Raynauds is currently not active.  Recurrent acute iridocyclitis of both eyes - Evaluated by Dr. Robyne Askew had no recent episodes.  Primary osteoarthritis of both hands-she had bilateral PIP DIP and CMC thickening consistent with osteoarthritis.  Joint protection was discussed.  Primary osteoarthritis of both knees -she continues to have pain and discomfort in the bilateral knee joints.  She has limited extension of her knee joints.  She was scheduled to have right total knee replacement by Dr. Linna Caprice which was postponed.  X-rays were consistent with severe lateral compartment narrowing bilaterally and severe chondromalacia patella.  Primary  osteoarthritis of both feet-proper fitting shoes were advised.  Bilateral calcaneal spurs  History of hyperlipidemia  History of migraine  History of hypertension-blood pressure was normal at 125/72 today.  Orders: No orders of the defined types were placed in this encounter.  No orders of the defined types were placed in this encounter.    Follow-Up Instructions: Return in about 5 months (around  08/22/2023) for Scleroderma.   Pollyann Savoy, MD  Note - This record has been created using Animal nutritionist.  Chart creation errors have been sought, but may not always  have been located. Such creation errors do not reflect on  the standard of medical care.

## 2023-03-08 NOTE — Telephone Encounter (Signed)
Pt was called concerning her low potassium. Pt was advised that she needed an appt to address this issue with JCL. Pt states that she is having surgery and will call and make an appt when that settles.

## 2023-03-08 NOTE — Telephone Encounter (Signed)
Patient advised about medication, she started it yesterday. She can now be scheduled.

## 2023-03-09 NOTE — Telephone Encounter (Signed)
Left message for pt to call back  °

## 2023-03-10 ENCOUNTER — Encounter (HOSPITAL_COMMUNITY): Payer: Self-pay | Admitting: Certified Registered Nurse Anesthetist

## 2023-03-10 ENCOUNTER — Encounter (HOSPITAL_COMMUNITY): Payer: Self-pay | Admitting: Physician Assistant

## 2023-03-11 NOTE — Telephone Encounter (Signed)
Pt was notified and pt is havign surgery next week

## 2023-03-15 ENCOUNTER — Ambulatory Visit: Payer: Self-pay | Admitting: Student

## 2023-03-15 NOTE — H&P (Signed)
TOTAL KNEE ADMISSION H&P  Patient is being admitted for right total knee arthroplasty.  Subjective:  Chief Complaint:right knee pain.  HPI: Rhonda Buchanan, 77 y.o. female, has a history of pain and functional disability in the right knee due to arthritis and has failed non-surgical conservative treatments for greater than 12 weeks to includeNSAID's and/or analgesics, corticosteriod injections, flexibility and strengthening excercises, use of assistive devices, and activity modification.  Onset of symptoms was gradual, starting 10 years ago with rapidlly worsening course since that time. The patient noted prior procedures on the knee to include  arthroscopy on the right knee(s).  Patient currently rates pain in the right knee(s) at 10 out of 10 with activity. Patient has night pain, worsening of pain with activity and weight bearing, pain that interferes with activities of daily living, pain with passive range of motion, crepitus, and joint swelling.  Patient has evidence of subchondral cysts, subchondral sclerosis, periarticular osteophytes, and joint space narrowing by imaging studies. There is no active infection.  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   Scleroderma (HCC) 07/12/2013   Hypertension 03/04/2011   Hyperlipidemia 03/04/2011   Allergic rhinitis 03/04/2011   Arthritis 03/04/2011   Migraine variant 04/18/1995    Past Medical History:  Diagnosis Date   Allergy    Arthritis    Asthma    Hypercholesterolemia    Hypertension    PONV (postoperative nausea and vomiting)    PUD (peptic ulcer disease)    Scleroderma (HCC)     Past Surgical History:  Procedure Laterality Date   arthroscopy     CYST REMOVAL HAND     EYE SURGERY Left 10/25/2017   laser surgery    KNEE ARTHROPLASTY      Current Outpatient Medications  Medication Sig Dispense Refill Last Dose   Brinzolamide-Brimonidine 1-0.2 % SUSP Place 1 drop into both eyes 3 (three) times daily.      folic acid (FOLVITE) 1 MG tablet Take 3 mg by mouth daily.       gabapentin (NEURONTIN) 100 MG capsule Take 1 capsule (100 mg total) by mouth at bedtime. (Patient not taking: Reported on 03/04/2023) 90 capsule 0    lisinopril-hydrochlorothiazide (ZESTORETIC) 10-12.5 MG tablet Take 1 tablet by mouth daily. 90 tablet 3    methotrexate (RHEUMATREX) 2.5 MG tablet Take 15 mg by mouth once a week. Caution:Chemotherapy. Protect from light.      metoprolol succinate (TOPROL-XL) 50 MG 24 hr tablet TAKE 1/2 TABLET BY MOUTH DAILY. TAKE WITH OR IMMEDIATELY FOLLOWING A MEAL. 45 tablet 1    multivitamin (THERAGRAN) per tablet Take 1 tablet by mouth daily.      potassium chloride (KLOR-CON) 10 MEQ tablet Take 1 tablet (10 mEq total) by mouth 2 (two) times daily. 60 tablet 0    prednisoLONE acetate (PRED FORTE) 1 % ophthalmic suspension Place 1 drop into both eyes daily.       rosuvastatin (CRESTOR) 40 MG  tablet Take 1 tablet (40 mg total) by mouth daily. 90 tablet 3    timolol (TIMOPTIC) 0.5 % ophthalmic solution Place 1 drop into both eyes every morning.      No current facility-administered medications for this visit.   Allergies  Allergen Reactions   Aspirin Other (See Comments)   Ibuprofen     GI upset    Social History   Tobacco Use   Smoking status: Never    Passive exposure: Never   Smokeless tobacco: Never  Substance Use Topics   Alcohol use:  No    Family History  Problem Relation Age of Onset   Cancer Father    Ulcers Sister    High Cholesterol Sister    Ulcers Brother    Heart disease Brother 22       MI   Cancer Maternal Grandmother    Heart disease Maternal Grandfather      Review of Systems  Musculoskeletal:  Positive for arthralgias, back pain and joint swelling.  All other systems reviewed and are negative.   Objective:  Physical Exam Constitutional:      Appearance: Normal appearance.  HENT:     Head: Normocephalic and atraumatic.     Nose: Nose normal.     Mouth/Throat:     Mouth: Mucous membranes are moist.     Pharynx: Oropharynx is clear.  Eyes:     Conjunctiva/sclera: Conjunctivae normal.  Cardiovascular:     Rate and Rhythm: Normal rate and regular rhythm.     Pulses: Normal pulses.     Heart sounds: Normal heart sounds.  Pulmonary:     Effort: Pulmonary effort is normal.     Breath sounds: Normal breath sounds.  Abdominal:     General: Abdomen is flat.     Palpations: Abdomen is soft.  Genitourinary:    Comments: deferred Musculoskeletal:     Cervical back: Normal range of motion and neck supple.     Comments: Examination of the right knee reveals healed arthroscopy portals. She has swelling, trace effusion. No warmth or erythema. Tenderness to palpation medial joint line, lateral joint line, peripatellar retinacular tissues with a positive grind sign. Her range of motion is 15 to 100 degrees without any ligamentous instability. Painless range of motion of the hip.   Distally, there is no focal motor or sensory deficit. She is palpable pedal pulses.  Skin:    General: Skin is warm and dry.     Capillary Refill: Capillary refill takes less than 2 seconds.  Neurological:     General: No focal deficit present.     Mental Status: She is alert and oriented to person, place, and time.  Psychiatric:        Mood and Affect: Mood normal.        Behavior: Behavior normal.        Thought  Content: Thought content normal.        Judgment: Judgment normal.     Vital signs in last 24 hours: @VSRANGES @  Labs:   Estimated body mass index is 26.71 kg/m as calculated from the following:   Height as of 03/07/23: 5' 5.5" (1.664 m).   Weight as of 03/07/23: 73.9 kg.   Imaging Review Plain radiographs demonstrate severe degenerative joint disease of the right knee(s). The overall alignment issignificant valgus. The bone quality appears to be adequate for age and reported activity level.      Assessment/Plan:  End stage arthritis, right knee  The patient history, physical examination, clinical judgment of the provider and imaging studies are consistent with end stage degenerative joint disease of the right knee(s) and total knee arthroplasty is deemed medically necessary. The treatment options including medical management, injection therapy arthroscopy and arthroplasty were discussed at length. The risks and benefits of total knee arthroplasty were presented and reviewed. The risks due to aseptic loosening, infection, stiffness, patella tracking problems, thromboembolic complications and other imponderables were discussed. The patient acknowledged the explanation, agreed to proceed with the plan and consent was signed. Patient is being admitted for inpatient treatment for surgery, pain control, PT, OT, prophylactic antibiotics, VTE prophylaxis, progressive ambulation and ADL's and discharge planning. The patient is planning to be discharged home after an overnight stay with OPPT.   Therapy Plans: outpatient therapy. PT at [facility] 03/14/23.  Disposition: Home with sister Planned DVT Prophylaxis: Eliquis 2.5mg  BID DME needed: Has rolling walker.  PCP: Cleared. Cardiology: Cleared.  Rheumatology: Okay to hold methotrexate.  TXA: IV Allergies:  - Nickel - swelling and redness and rash - Aspirin intolerance - stomach pain and history of stomach ulcers.  - Oxycodone -  N/V - Anesthetics, amide type - N/V.  Anesthesia Concerns: N/V with anesthesia.  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 We discussed to hold medication before and after surgery for optimal wound healing. Has topical cream.  - Staples** - PAD history - History right knee arthroscopy, ~13 years ago.  - History of stomach ulcers no NSAIDs  - Hydrocodone vs dilaudid, zofran.  - 03/07/23: Hgb 13.0, Cr 1.20*, K+ 2.7** patient started on potassium supplementation with PCP yesterday.     Patient's anticipated LOS is less than 2 midnights, meeting these requirements: - Younger than 11 - Lives within 1 hour of care - Has a competent adult at home to recover with post-op recover - NO history of  - Chronic pain requiring opiods  - Diabetes  - Coronary Artery Disease  - Heart failure  - Heart attack  - Stroke  - DVT/VTE  - Cardiac arrhythmia  - Respiratory Failure/COPD  - Renal failure  - Anemia  - Advanced Liver disease

## 2023-03-16 ENCOUNTER — Ambulatory Visit (HOSPITAL_COMMUNITY)
Admission: EM | Admit: 2023-03-16 | Discharge: 2023-03-16 | Disposition: A | Discharge status: Home / Self Care | Payer: Medicare Other | Attending: Emergency Medicine | Admitting: Emergency Medicine

## 2023-03-16 ENCOUNTER — Encounter (HOSPITAL_COMMUNITY): Payer: Self-pay

## 2023-03-16 DIAGNOSIS — K122 Cellulitis and abscess of mouth: Secondary | ICD-10-CM

## 2023-03-16 DIAGNOSIS — R220 Localized swelling, mass and lump, head: Secondary | ICD-10-CM | POA: Diagnosis not present

## 2023-03-16 MED ORDER — AMOXICILLIN-POT CLAVULANATE 875-125 MG PO TABS: 1.0000 | ORAL_TABLET | Freq: Two times a day (BID) | ORAL | 0 refills | Status: DC

## 2023-03-16 MED ORDER — CHLORHEXIDINE GLUCONATE 0.12 % MT SOLN: 15.0000 mL | Freq: Two times a day (BID) | OROMUCOSAL | 0 refills | Status: DC

## 2023-03-16 NOTE — Anesthesia Preprocedure Evaluation (Signed)
Anesthesia Evaluation    Reviewed: Allergy & Precautions, Patient's Chart, lab work & pertinent test results  History of Anesthesia Complications (+) PONV and history of anesthetic complications  Airway        Dental   Pulmonary asthma           Cardiovascular hypertension, + CAD and + Peripheral Vascular Disease       Neuro/Psych negative neurological ROS  negative psych ROS   GI/Hepatic Neg liver ROS, PUD,,,  Endo/Other  negative endocrine ROS    Renal/GU negative Renal ROS     Musculoskeletal  (+) Arthritis ,    Abdominal   Peds  Hematology negative hematology ROS (+)   Anesthesia Other Findings   Reproductive/Obstetrics                             Anesthesia Physical Anesthesia Plan  ASA:   Anesthesia Plan:    Post-op Pain Management:    Induction:   PONV Risk Score and Plan:   Airway Management Planned:   Additional Equipment:   Intra-op Plan:   Post-operative Plan:   Informed Consent:   Plan Discussed with:   Anesthesia Plan Comments: (See PAT note 03/07/2023  Patient seen in ED yesterday for presumed dental infection with facial swelling, started on antibiotics. Discussed with surgeon, case postponed)       Anesthesia Quick Evaluation

## 2023-03-16 NOTE — ED Provider Notes (Signed)
[facility]    CSN: [id number] Arrival date & time: 03/16/23  1517      History   Chief Complaint Chief Complaint  Patient presents with   Facial Swelling    HPI Rhonda Buchanan is a 77 y.o. female.   Patient reports going to the bathroom earlier and noticed that the right side of her face was swollen.  When she went to look at it closer, she noticed a white spot to the inside of her right cheek, where the swelling was the worst.  She has scattered in addition, no known dental issues currently.  Denies any fevers.  She is scheduled for a knee replacement tomorrow.    The history is provided by the patient and medical records.    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      Home Medications     Prior to Admission medications   Medication Sig Start Date End Date Taking? Authorizing Provider  amoxicillin-clavulanate (AUGMENTIN) 875-125 MG tablet Take 1 tablet by mouth every 12 (twelve) hours. 03/16/23  Yes Rinaldo Ratel, Rhonda N, FNP  chlorhexidine (PERIDEX) 0.12 % solution Use as directed 15 mLs in the mouth or throat 2 (two) times daily. 03/16/23  Yes Rinaldo Ratel, Rhonda N, FNP  Brinzolamide-Brimonidine 1-0.2 % SUSP Place 1 drop into both eyes 3 (three) times daily.    [provider]  folic acid (FOLVITE) 1 MG tablet Take 3 mg by mouth daily.     [provider]  gabapentin (NEURONTIN) 100 MG capsule Take 1 capsule (100 mg total) by mouth at bedtime. Patient not taking: Reported on 03/04/2023 07/13/22   Edwin Cap, DPM  lisinopril-hydrochlorothiazide (ZESTORETIC) 10-12.5 MG tablet Take 1 tablet by mouth daily. 04/22/22   Ronnald Nian, MD  methotrexate (RHEUMATREX) 2.5 MG tablet Take 15 mg by mouth once a week. Caution:Chemotherapy. Protect from light.    [provider]  metoprolol succinate (TOPROL-XL) 50 MG 24 hr tablet TAKE 1/2 TABLET BY MOUTH DAILY. TAKE WITH OR IMMEDIATELY FOLLOWING A MEAL. 10/01/22   Ronnald Nian, MD  multivitamin [facility]) per tablet Take 1 tablet by mouth daily.    [provider]  potassium chloride (KLOR-CON)  10 MEQ tablet Take 1 tablet (10 mEq total) by mouth 2 (two) times daily. 03/07/23   Tysinger, Kermit Balo, PA-C  prednisoLONE acetate (PRED FORTE) 1 % ophthalmic suspension Place 1 drop into both eyes daily.     [provider]  rosuvastatin (CRESTOR) 40 MG tablet Take 1 tablet (40 mg total) by mouth daily. 04/22/22   Ronnald Nian, MD  timolol (TIMOPTIC) 0.5 % ophthalmic solution Place 1 drop into both eyes every morning.    [provider]    Family History Family History  Problem Relation Age of Onset   Cancer Father    Ulcers Sister    High Cholesterol Sister    Ulcers Brother    Heart  disease Brother 75       MI   Cancer Maternal Grandmother    Heart disease Maternal Grandfather     Social History Social History   Tobacco Use   Smoking status: Never    Passive exposure: Never   Smokeless tobacco: Never  Vaping Use   Vaping Use: Never used  Substance Use Topics   Alcohol use: No   Drug use: No     Allergies   Aspirin and Ibuprofen   Review of Systems Review of Systems  Constitutional:  Negative for fever.  HENT:  Positive for dental problem and mouth sores.      Physical Exam Triage Vital Signs ED Triage Vitals  Enc Vitals Group     BP 03/16/23 1656 (!) 169/87     Pulse Rate 03/16/23 1656 72     Resp 03/16/23 1656 16     Temp 03/16/23 1656 98.2 F (36.8 C)     Temp Source 03/16/23 1656 Oral     SpO2 03/16/23 1656 97 %     Weight --      Height --      Head Circumference --      Peak Flow --      Pain Score 03/16/23 1658 0     Pain Loc --      Pain Edu? --      Excl. in GC? --    No data found.  Updated Vital Signs BP (!) 169/87 (BP Location: Right Arm)   Pulse 72   Temp 98.2 F (36.8 C) (Oral)   Resp 16   LMP 02/16/2003   SpO2 97%   Visual Acuity Right Eye Distance:   Left Eye Distance:   Bilateral Distance:    Right Eye Near:   Left Eye Near:    Bilateral Near:     Physical Exam Vitals and nursing note reviewed.  Constitutional:      Appearance: Normal appearance.  HENT:     Head: Normocephalic and atraumatic.      Right Ear: External ear normal.     Left Ear: External ear normal.     Nose: Nose normal.     Mouth/Throat:     Mouth: Mucous membranes are moist.     Dentition: Abnormal dentition.     Pharynx: Uvula midline.     Tonsils: No tonsillar exudate or tonsillar abscesses.     Comments: Right inner cheek swelling with white patches, cheek tenderness. Multiple dentition abnormalities.  Eyes:     Conjunctiva/sclera: Conjunctivae normal.  Cardiovascular:     Rate and Rhythm: Normal rate.  Pulmonary:      Effort: Pulmonary effort is normal. No respiratory distress.  Skin:    General: Skin is warm and dry.  Neurological:     Mental Status: She is alert.  Psychiatric:        Behavior: Behavior is cooperative.      UC Treatments / Results  Labs (all labs ordered are listed, but only abnormal results are displayed) Labs Reviewed - No data to display  EKG   Radiology No results found.  Procedures Procedures (including critical care time)  Medications Ordered in UC Medications - No data to display  Initial Impression / Assessment and Plan / UC Course  I have reviewed the triage vital signs and the nursing notes.  Pertinent labs & imaging results that were available during my care of the patient were reviewed by me and considered in my medical decision making (see chart for details).  Vitals in triage reviewed, patient is hemodynamically stable.  Right cheek swelling with associated sore internally.  Multiple dentition abnormalities and cavities.  Concern over rapid progression of swelling and tenderness, will cover with Augmentin for oral infection.  Recent estimated GFR was 51, no adjustment needed with Augmentin dose.  Given chlorhexidine mouth rinse as well.  Encouraged to follow-up with a dentist for clearance prior to intubation and knee operation.  Patient verbalized understanding, no questions at this time.     Final Clinical Impressions(s) / UC Diagnoses   Final diagnoses:  Facial swelling  Oral infection     Discharge Instructions      I believe you have an oral infection.  Please take all antibiotics as prescribed and until finished.  Use the Peridex rinse twice daily to help with topical antibacterial properties.  Unfortunately, this means you need to postpone your knee surgery until you are cleared by your dentist.  You can use Tylenol as needed for pain and swelling.  Please return to clinic for any new or concerning symptoms.     ED Prescriptions      Medication Sig Dispense Auth. Provider   amoxicillin-clavulanate (AUGMENTIN) 875-125 MG tablet Take 1 tablet by mouth every 12 (twelve) hours. 14 tablet Rinaldo Ratel, Rhonda N, [place]   chlorhexidine (PERIDEX) 0.12 % solution Use as directed 15 mLs in the mouth or throat 2 (two) times daily. 120 mL Nirvaan Frett, Rhonda N, [place]      PDMP not reviewed this encounter.   Omayra Tulloch, Rhonda N, [place] 03/16/23 1721

## 2023-03-16 NOTE — ED Triage Notes (Signed)
Patient states she began having right facial swelling at 1400 today. Patient denies any problems with breathing or swallowing.  Patient has not had any medications for her symptoms.

## 2023-03-16 NOTE — Discharge Instructions (Addendum)
I believe you have an oral infection.  Please take all antibiotics as prescribed and until finished.  Use the Peridex rinse twice daily to help with topical antibacterial properties.  Unfortunately, this means you need to postpone your knee surgery until you are cleared by your dentist.  You can use Tylenol as needed for pain and swelling.  Please return to clinic for any new or concerning symptoms.

## 2023-03-16 NOTE — Progress Notes (Signed)
Anesthesia Chart Review   Case: [id number] Date/Time: 03/17/23 1025   Procedure: COMPUTER ASSISTED TOTAL KNEE ARTHROPLASTY (Right: Knee) - 170   Anesthesia type: Spinal   Pre-op diagnosis: Right knee osteoarthritis   Location: [facility] ROOM 08 / [facility]   Surgeons: Samson Frederic, MD       DISCUSSION:77 y.o. never smoker with h/o PONV, HTN, scleroderma, asthma, right knee OA scheduled for above procedure 03/17/2023 with Dr. Samson Frederic.   Pt with Potassium of 2.7 at PAT visit. Forwarded to PCP and Careers adviser. Pt started on potassium by PCP. Will recheck DOS.   Per cardiology preoperative evaluation 01/24/2023, "Chart reviewed as part of pre-operative protocol coverage. Given past medical history and time since last visit, based on ACC/AHA guidelines, Rhonda Buchanan would be at acceptable risk for the planned procedure without further cardiovascular testing.    Her RCRI is a class II risk, 0.9% risk of major cardiac event."   VS: BP (!) 122/59   Pulse 62   Temp 36.8 C (Oral)   Resp 16   Ht 5' 5.5" (1.664 m)   Wt 73.9 kg   LMP 02/16/2003   SpO2 100%   BMI 26.71 kg/m   PROVIDERS: Ronnald Nian, MD is PCP   Primary Cardiologist: Arvilla Meres, MD  LABS:  forwarded to surgeon and PCP, recheck DOS (all labs ordered are listed, but only abnormal results are displayed)  Labs Reviewed  BASIC METABOLIC PANEL - Abnormal; Notable for the following components:      Result Value   Potassium 2.7 (*)    Glucose, Bld 106 (*)    Creatinine, Ser 1.20 (*)    GFR, Estimated 46 (*)    All other components within normal limits  SURGICAL PCR SCREEN  CBC     IMAGES:   EKG:   CV: Echo 08/12/2022 1. Left ventricular ejection fraction, by estimation, is 60 to 65%. The  left ventricle has normal function. The left ventricle has no regional  wall motion abnormalities. Left ventricular diastolic parameters are  consistent with Grade I diastolic  dysfunction (impaired relaxation).    2. Right ventricular systolic function is normal. The right ventricular  size is normal. There is normal pulmonary artery systolic pressure. The  estimated right ventricular systolic pressure is 34.6 mmHg.   3. The mitral valve is grossly normal. Trivial mitral valve  regurgitation. No evidence of mitral stenosis.   4. The aortic valve is calcified. There is moderate calcification of the  aortic valve. There is moderate thickening of the aortic valve. Aortic  valve regurgitation is not visualized. Aortic valve  sclerosis/calcification is present, without any evidence  of aortic stenosis.   5. The inferior vena cava is dilated in size with >50% respiratory  variability, suggesting right atrial pressure of 8 mmHg.  Past Medical History:  Diagnosis Date   Allergy    Arthritis    Asthma    Hypercholesterolemia    Hypertension    PONV (postoperative nausea and vomiting)    PUD (peptic ulcer disease)    Scleroderma (HCC)     Past Surgical History:  Procedure Laterality Date   arthroscopy     CYST REMOVAL HAND     EYE SURGERY Left 10/25/2017   laser surgery    KNEE ARTHROPLASTY      MEDICATIONS:  Brinzolamide-Brimonidine 1-0.2 % SUSP   folic acid (FOLVITE) 1 MG tablet   gabapentin (NEURONTIN) 100 MG capsule   lisinopril-hydrochlorothiazide (ZESTORETIC) 10-12.5 MG tablet  methotrexate (RHEUMATREX) 2.5 MG tablet   metoprolol succinate (TOPROL-XL) 50 MG 24 hr tablet   multivitamin (THERAGRAN) per tablet   potassium chloride (KLOR-CON) 10 MEQ tablet   prednisoLONE acetate (PRED FORTE) 1 % ophthalmic suspension   rosuvastatin (CRESTOR) 40 MG tablet   timolol (TIMOPTIC) 0.5 % ophthalmic solution   No current facility-administered medications for this encounter.   Rhonda Cipro Ward, PA-C [facility] [phone]

## 2023-03-17 ENCOUNTER — Other Ambulatory Visit: Payer: Self-pay

## 2023-03-17 ENCOUNTER — Encounter (HOSPITAL_COMMUNITY)
Admission: RE | Disposition: A | Discharge status: Home / Self Care | Payer: Self-pay | Source: Ambulatory Visit | Attending: Orthopedic Surgery

## 2023-03-17 ENCOUNTER — Ambulatory Visit (HOSPITAL_COMMUNITY)
Admission: RE | Admit: 2023-03-17 | Discharge: 2023-03-17 | Disposition: A | Discharge status: Home / Self Care | Payer: Medicare Other | Source: Ambulatory Visit | Attending: Orthopedic Surgery | Admitting: Orthopedic Surgery

## 2023-03-17 DIAGNOSIS — M1990 Unspecified osteoarthritis, unspecified site: Secondary | ICD-10-CM | POA: Diagnosis not present

## 2023-03-17 DIAGNOSIS — M25562 Pain in left knee: Secondary | ICD-10-CM | POA: Diagnosis not present

## 2023-03-17 DIAGNOSIS — M25561 Pain in right knee: Secondary | ICD-10-CM | POA: Diagnosis not present

## 2023-03-17 DIAGNOSIS — E876 Hypokalemia: Secondary | ICD-10-CM

## 2023-03-17 DIAGNOSIS — Z01818 Encounter for other preprocedural examination: Secondary | ICD-10-CM

## 2023-03-17 DIAGNOSIS — Z539 Procedure and treatment not carried out, unspecified reason: Secondary | ICD-10-CM | POA: Insufficient documentation

## 2023-03-17 SURGERY — ARTHROPLASTY, KNEE, TOTAL, USING IMAGELESS COMPUTER-ASSISTED NAVIGATION
Anesthesia: Spinal | Site: Knee | Laterality: Right

## 2023-03-17 MED ORDER — LACTATED RINGERS IV SOLN: INTRAVENOUS | Status: DC

## 2023-03-17 MED ORDER — ACETAMINOPHEN 500 MG PO TABS
1000.0000 mg | ORAL_TABLET | Freq: Once | ORAL | Status: DC
  Filled 2023-03-17: qty 2

## 2023-03-17 MED ORDER — CHLORHEXIDINE GLUCONATE 0.12 % MT SOLN: 15.0000 mL | Freq: Once | OROMUCOSAL | Status: DC

## 2023-03-17 MED ORDER — CEFAZOLIN SODIUM-DEXTROSE 2-4 GM/100ML-% IV SOLN
2.0000 g | INTRAVENOUS | Status: DC
  Filled 2023-03-17: qty 100

## 2023-03-17 MED ORDER — TRANEXAMIC ACID-NACL 1000-0.7 MG/100ML-% IV SOLN
1000.0000 mg | INTRAVENOUS | Status: DC
  Filled 2023-03-17: qty 100

## 2023-03-17 MED ORDER — ORAL CARE MOUTH RINSE: 15.0000 mL | Freq: Once | OROMUCOSAL | Status: DC

## 2023-03-17 MED ORDER — FENTANYL CITRATE PF 50 MCG/ML IJ SOSY: 50.0000 ug | PREFILLED_SYRINGE | INTRAMUSCULAR | Status: DC

## 2023-03-17 MED ORDER — POVIDONE-IODINE 10 % EX SWAB: 2.0000 | Freq: Once | CUTANEOUS | Status: DC

## 2023-03-17 MED ORDER — MIDAZOLAM HCL 2 MG/2ML IJ SOLN: 1.0000 mg | INTRAMUSCULAR | Status: DC

## 2023-03-17 NOTE — Progress Notes (Addendum)
Cancelled surgical case d/t facial swelling. Concern regarding multiple dentition abnormalities and cavities and course of Augmentin started post visit to [facility] on 03/16/23. Patient aware of cancellation.  Patient advised to follow up with dental provider by charge RN J. Hazlip, RN

## 2023-03-21 ENCOUNTER — Ambulatory Visit (INDEPENDENT_AMBULATORY_CARE_PROVIDER_SITE_OTHER): Payer: Medicare Other | Admitting: Medical

## 2023-03-21 VITALS — BP 130/74 | HR 74 | Temp 98.1°F | Wt 168.2 lb

## 2023-03-21 DIAGNOSIS — E876 Hypokalemia: Secondary | ICD-10-CM

## 2023-03-21 DIAGNOSIS — R600 Localized edema: Secondary | ICD-10-CM

## 2023-03-21 DIAGNOSIS — R220 Localized swelling, mass and lump, head: Secondary | ICD-10-CM | POA: Diagnosis not present

## 2023-03-21 NOTE — Progress Notes (Signed)
Subjective:  Rhonda Buchanan is a 77 y.o. female who presents for Chief Complaint  Patient presents with   Facial Swelling    Face swelling, started Wednesday of last week. Went to UC- and given antibiotics and told to go to dentist. Dentist said it was nothing going on to make face swell. Right sided swelling has gone down.      Here for facial swelling.  Last week was scheduled for knee surgery, however, face started swelling 4 days ago.  Whole right cheek and jaw swollen.  Not painful, but was swollen inside and outside.  There was some whitish coloration on inside.  Went to urgent care, and they felt she had a tooth cavity, and felt she had dental infection.   Next morning went for knee surgery as scheduled but given the swelling, they postponed her surgery.  Was told to see dentist and PCP.  Last week ended up seeing her dentist.  They felt the swelling had nothing to do with the swelling, no abscess.   Was advised to f/u with [place] PCP here.   Was given antibiotic at urgent care and mouthwash.  She has used both.    No other aggravating or relieving factors.    No other c/o.  The following portions of the patient's history were reviewed and updated as appropriate: allergies, current medications, past family history, past medical history, past social history, past surgical history and problem list.  ROS Otherwise as in subjective above    Objective: BP 130/74   Pulse 74   Temp 98.1 F (36.7 C)   Wt 168 lb 3.2 oz (76.3 kg)   LMP 02/16/2003   SpO2 98%   BMI 27.56 kg/m   General appearance: alert, no distress, well developed, well nourished No obvious facial swelling today HEENT: normocephalic, sclerae anicteric, conjunctiva pink and moist, TMs pearly, nares patent, no discharge or erythema, pharynx normal, no gum swelling or intraoral lesion, teeth in good repair Oral cavity: MMM, no lesions Neck: supple, no lymphadenopathy, no thyromegaly, no masses Heart: RRR, normal S1,  S2, no murmurs Lungs: CTA bilaterally, no wheezes, rhonchi, or ralesPulses: 2+ radial pulses, 2+ pedal pulses, normal cap refill Ext: no edema    Assessment: Encounter Diagnoses  Name Primary?   Hypokalemia Yes   Facial swelling    Swelling of right parotid gland      Plan: Hypokalemia on recent preop lab.  I had already called in potassium which she started.  Recheck lab today.  If potassium back to normal, follow up with Dr. Susann Givens PCP here in July as scheduled for well visit and discuss trial off HCTZ and potassium.   Facial swelling has completely resolved.  Given that she saw dentist, no oral lesions and given the fact that it was painful without erythema suggests stone blocking duct.  symptoms suggest drainage over the weekend from the parotid duct.   Reassured.  Rhonda was seen today for facial swelling.  Diagnoses and all orders for this visit:  Hypokalemia -     Basic metabolic panel  Facial swelling  Swelling of right parotid gland    Follow up: pending labs

## 2023-03-22 ENCOUNTER — Ambulatory Visit: Payer: Medicare Other | Admitting: Rheumatology

## 2023-03-22 ENCOUNTER — Encounter: Payer: Self-pay | Admitting: Rheumatology

## 2023-03-22 VITALS — BP 125/72 | HR 71 | Resp 14 | Ht 65.5 in | Wt 169.2 lb

## 2023-03-22 DIAGNOSIS — L940 Localized scleroderma [morphea]: Secondary | ICD-10-CM

## 2023-03-22 DIAGNOSIS — Z8679 Personal history of other diseases of the circulatory system: Secondary | ICD-10-CM

## 2023-03-22 DIAGNOSIS — Z8669 Personal history of other diseases of the nervous system and sense organs: Secondary | ICD-10-CM | POA: Diagnosis not present

## 2023-03-22 DIAGNOSIS — M19042 Primary osteoarthritis, left hand: Secondary | ICD-10-CM

## 2023-03-22 DIAGNOSIS — Z8639 Personal history of other endocrine, nutritional and metabolic disease: Secondary | ICD-10-CM | POA: Diagnosis not present

## 2023-03-22 DIAGNOSIS — M19071 Primary osteoarthritis, right ankle and foot: Secondary | ICD-10-CM | POA: Diagnosis not present

## 2023-03-22 DIAGNOSIS — M349 Systemic sclerosis, unspecified: Secondary | ICD-10-CM | POA: Diagnosis not present

## 2023-03-22 DIAGNOSIS — M170 Bilateral primary osteoarthritis of knee: Secondary | ICD-10-CM

## 2023-03-22 DIAGNOSIS — H20023 Recurrent acute iridocyclitis, bilateral: Secondary | ICD-10-CM | POA: Diagnosis not present

## 2023-03-22 DIAGNOSIS — M7731 Calcaneal spur, right foot: Secondary | ICD-10-CM | POA: Diagnosis not present

## 2023-03-22 DIAGNOSIS — M19041 Primary osteoarthritis, right hand: Secondary | ICD-10-CM | POA: Diagnosis not present

## 2023-03-22 DIAGNOSIS — I7300 Raynaud's syndrome without gangrene: Secondary | ICD-10-CM | POA: Diagnosis not present

## 2023-03-22 DIAGNOSIS — Z79899 Other long term (current) drug therapy: Secondary | ICD-10-CM | POA: Diagnosis not present

## 2023-03-22 DIAGNOSIS — R7611 Nonspecific reaction to tuberculin skin test without active tuberculosis: Secondary | ICD-10-CM | POA: Diagnosis not present

## 2023-03-22 DIAGNOSIS — M19072 Primary osteoarthritis, left ankle and foot: Secondary | ICD-10-CM

## 2023-03-22 DIAGNOSIS — M7732 Calcaneal spur, left foot: Secondary | ICD-10-CM

## 2023-03-22 LAB — BASIC METABOLIC PANEL
BUN/Creatinine Ratio: 7 — ABNORMAL LOW (ref 12–28)
BUN: 8 mg/dL (ref 8–27)
CO2: 25 mmol/L (ref 20–29)
Calcium: 10.2 mg/dL (ref 8.7–10.3)
Chloride: 101 mmol/L (ref 96–106)
Creatinine, Ser: 1.15 mg/dL — ABNORMAL HIGH (ref 0.57–1.00)
Glucose: 92 mg/dL (ref 70–99)
Potassium: 3.2 mmol/L — ABNORMAL LOW (ref 3.5–5.2)
Sodium: 140 mmol/L (ref 134–144)
eGFR: 49 mL/min/{1.73_m2} — ABNORMAL LOW (ref >59)

## 2023-03-22 NOTE — Patient Instructions (Signed)

## 2023-03-22 NOTE — Progress Notes (Signed)
Even after starting potassium BID this past week, potassium still low.  I'm going to forward this to PCP Dr Susann Givens and get his opinion.  I recommend stopping Lisinopril HCT and changing to Valsartan 80mg .     Of note, patient had to postpone knee surgery last week due to facial swelling and unrelated hypokalemia on preop labs.

## 2023-03-29 ENCOUNTER — Other Ambulatory Visit: Payer: Self-pay | Admitting: Medical

## 2023-04-05 ENCOUNTER — Ambulatory Visit (INDEPENDENT_AMBULATORY_CARE_PROVIDER_SITE_OTHER): Payer: Medicare Other

## 2023-04-05 VITALS — Ht 65.5 in | Wt 166.0 lb

## 2023-04-05 DIAGNOSIS — Z0000 Encounter for general adult medical examination without abnormal findings: Secondary | ICD-10-CM | POA: Diagnosis not present

## 2023-04-05 NOTE — Patient Instructions (Signed)
Rhonda Buchanan , Thank you for taking time to come for your Medicare Wellness Visit. I appreciate your ongoing commitment to your health goals. Please review the following plan we discussed and let me know if I can assist you in the future.   These are the goals we discussed:  Goals      Patient Stated     04/05/2023, no goals        This is a list of the screening recommended for you and due dates:  Health Maintenance  Topic Date Due   Zoster (Shingles) Vaccine (1 of 2) Never done   COVID-19 Vaccine (6 - 2023-24 season) 10/10/2022   Pneumonia Vaccine (3 of 3 - PPSV23 or PCV20) 01/16/2029*   Flu Shot  05/19/2023   Medicare Annual Wellness Visit  04/04/2024   DTaP/Tdap/Td vaccine (3 - Td or Tdap) 12/20/2030   DEXA scan (bone density measurement)  Completed   Hepatitis C Screening  Completed   HPV Vaccine  Aged Out   Colon Cancer Screening  Discontinued   Cologuard (Stool DNA test)  Discontinued  *Topic was postponed. The date shown is not the original due date.    Advanced directives: Advance directive discussed with you today.   Conditions/risks identified: none  Next appointment: Follow up in one year for your annual wellness visit    Preventive Care 65 Years and Older, Female Preventive care refers to lifestyle choices and visits with your health care provider that can promote health and wellness. What does preventive care include? A yearly physical exam. This is also called an annual well check. Dental exams once or twice a year. Routine eye exams. Ask your health care provider how often you should have your eyes checked. Personal lifestyle choices, including: Daily care of your teeth and gums. Regular physical activity. Eating a healthy diet. Avoiding tobacco and drug use. Limiting alcohol use. Practicing safe sex. Taking low-dose aspirin every day. Taking vitamin and mineral supplements as recommended by your health care provider. What happens during an annual well  check? The services and screenings done by your health care provider during your annual well check will depend on your age, overall health, lifestyle risk factors, and family history of disease. Counseling  Your health care provider may ask you questions about your: Alcohol use. Tobacco use. Drug use. Emotional well-being. Home and relationship well-being. Sexual activity. Eating habits. History of falls. Memory and ability to understand (cognition). Work and work Astronomer. Reproductive health. Screening  You may have the following tests or measurements: Height, weight, and BMI. Blood pressure. Lipid and cholesterol levels. These may be checked every 5 years, or more frequently if you are over 12 years old. Skin check. Lung cancer screening. You may have this screening every year starting at age 18 if you have a 30-pack-year history of smoking and currently smoke or have quit within the past 15 years. Fecal occult blood test (FOBT) of the stool. You may have this test every year starting at age 23. Flexible sigmoidoscopy or colonoscopy. You may have a sigmoidoscopy every 5 years or a colonoscopy every 10 years starting at age 37. Hepatitis C blood test. Hepatitis B blood test. Sexually transmitted disease (STD) testing. Diabetes screening. This is done by checking your blood sugar (glucose) after you have not eaten for a while (fasting). You may have this done every 1-3 years. Bone density scan. This is done to screen for osteoporosis. You may have this done starting at age 54. Mammogram. This  may be done every 1-2 years. Talk to your health care provider about how often you should have regular mammograms. Talk with your health care provider about your test results, treatment options, and if necessary, the need for more tests. Vaccines  Your health care provider may recommend certain vaccines, such as: Influenza vaccine. This is recommended every year. Tetanus, diphtheria, and  acellular pertussis (Tdap, Td) vaccine. You may need a Td booster every 10 years. Zoster vaccine. You may need this after age 42. Pneumococcal 13-valent conjugate (PCV13) vaccine. One dose is recommended after age 60. Pneumococcal polysaccharide (PPSV23) vaccine. One dose is recommended after age 79. Talk to your health care provider about which screenings and vaccines you need and how often you need them. This information is not intended to replace advice given to you by your health care provider. Make sure you discuss any questions you have with your health care provider. Document Released: 10/31/2015 Document Revised: 06/23/2016 Document Reviewed: 08/05/2015 Elsevier Interactive Patient Education  2017 [place] Prevention in the Home Falls can cause injuries. They can happen to people of all ages. There are many things you can do to make your home safe and to help prevent falls. What can I do on the outside of my home? Regularly fix the edges of walkways and driveways and fix any cracks. Remove anything that might make you trip as you walk through a door, such as a raised step or threshold. Trim any bushes or trees on the path to your home. Use bright outdoor lighting. Clear any walking paths of anything that might make someone trip, such as rocks or tools. Regularly check to see if handrails are loose or broken. Make sure that both sides of any steps have handrails. Any raised decks and porches should have guardrails on the edges. Have any leaves, snow, or ice cleared regularly. Use sand or salt on walking paths during winter. Clean up any spills in your garage right away. This includes oil or grease spills. What can I do in the bathroom? Use night lights. Install grab bars by the toilet and in the tub and shower. Do not use towel bars as grab bars. Use non-skid mats or decals in the tub or shower. If you need to sit down in the shower, use a plastic, non-slip stool. Keep  the floor dry. Clean up any water that spills on the floor as soon as it happens. Remove soap buildup in the tub or shower regularly. Attach bath mats securely with double-sided non-slip rug tape. Do not have throw rugs and other things on the floor that can make you trip. What can I do in the bedroom? Use night lights. Make sure that you have a light by your bed that is easy to reach. Do not use any sheets or blankets that are too big for your bed. They should not hang down onto the floor. Have a firm chair that has side arms. You can use this for support while you get dressed. Do not have throw rugs and other things on the floor that can make you trip. What can I do in the kitchen? Clean up any spills right away. Avoid walking on wet floors. Keep items that you use a lot in easy-to-reach places. If you need to reach something above you, use a strong step stool that has a grab bar. Keep electrical cords out of the way. Do not use floor polish or wax that makes floors slippery. If you must  use wax, use non-skid floor wax. Do not have throw rugs and other things on the floor that can make you trip. What can I do with my stairs? Do not leave any items on the stairs. Make sure that there are handrails on both sides of the stairs and use them. Fix handrails that are broken or loose. Make sure that handrails are as long as the stairways. Check any carpeting to make sure that it is firmly attached to the stairs. Fix any carpet that is loose or worn. Avoid having throw rugs at the top or bottom of the stairs. If you do have throw rugs, attach them to the floor with carpet tape. Make sure that you have a light switch at the top of the stairs and the bottom of the stairs. If you do not have them, ask someone to add them for you. What else can I do to help prevent falls? Wear shoes that: Do not have high heels. Have rubber bottoms. Are comfortable and fit you well. Are closed at the toe. Do not  wear sandals. If you use a stepladder: Make sure that it is fully opened. Do not climb a closed stepladder. Make sure that both sides of the stepladder are locked into place. Ask someone to hold it for you, if possible. Clearly mark and make sure that you can see: Any grab bars or handrails. First and last steps. Where the edge of each step is. Use tools that help you move around (mobility aids) if they are needed. These include: Canes. Walkers. Scooters. Crutches. Turn on the lights when you go into a dark area. Replace any light bulbs as soon as they burn out. Set up your furniture so you have a clear path. Avoid moving your furniture around. If any of your floors are uneven, fix them. If there are any pets around you, be aware of where they are. Review your medicines with your doctor. Some medicines can make you feel dizzy. This can increase your chance of falling. Ask your doctor what other things that you can do to help prevent falls. This information is not intended to replace advice given to you by your health care provider. Make sure you discuss any questions you have with your health care provider. Document Released: 07/31/2009 Document Revised: 03/11/2016 Document Reviewed: 11/08/2014 Elsevier Interactive Patient Education  2017 [employer].

## 2023-04-05 NOTE — Progress Notes (Signed)
Subjective:   Rhonda Buchanan is a 77 y.o. female who presents for Medicare Annual (Subsequent) preventive examination.  Visit Complete: Virtual  I connected with  Rhonda Buchanan on 04/05/23 by a audio enabled telemedicine application and verified that I am speaking with the correct person using two identifiers.  Patient Location: Home  Provider Location: Office/Clinic  I discussed the limitations of evaluation and management by telemedicine. The patient expressed understanding and agreed to proceed.  Patient Medicare AWV questionnaire was completed by the patient on 04/04/2023; I have confirmed that all information answered by patient is correct and no changes since this date.  Review of Systems     Cardiac Risk Factors include: advanced age (>55men, >15 women);dyslipidemia;hypertension     Objective:    Today's Vitals   04/05/23 1426  Weight: 166 lb (75.3 kg)  Height: 5' 5.5" (1.664 m)   Body mass index is 27.2 kg/m.     04/05/2023    2:33 PM 03/07/2023    1:08 PM 09/24/2022    9:36 AM 04/22/2022   10:07 AM 06/05/2021    6:29 PM 04/15/2021   10:04 AM 04/14/2020   10:31 AM  Advanced Directives  Does Patient Have a Medical Advance Directive? No No No Yes No No   Type of Advance Directive    Living will     Does patient want to make changes to medical advance directive?    No - Patient declined  Yes (ED - Information included in AVS) No - Patient declined  Would patient like information on creating a medical advance directive?   No - Patient declined No - Patient declined Yes (ED - send information to MyChart) Yes (ED - Information included in AVS)     Current Medications (verified) Outpatient Encounter Medications as of 04/05/2023  Medication Sig   Brinzolamide-Brimonidine 1-0.2 % SUSP Place 1 drop into both eyes 3 (three) times daily.   chlorhexidine (PERIDEX) 0.12 % solution Use as directed 15 mLs in the mouth or throat 2 (two) times daily.   folic acid (FOLVITE) 1  MG tablet Take 3 mg by mouth daily.   lisinopril-hydrochlorothiazide (ZESTORETIC) 10-12.5 MG tablet Take 1 tablet by mouth daily.   methotrexate (RHEUMATREX) 2.5 MG tablet Take 15 mg by mouth once a week. Caution:Chemotherapy. Protect from light.   metoprolol succinate (TOPROL-XL) 50 MG 24 hr tablet TAKE 1/2 TABLET BY MOUTH DAILY. TAKE WITH OR IMMEDIATELY FOLLOWING A MEAL.   multivitamin (THERAGRAN) per tablet Take 1 tablet by mouth daily.   potassium chloride (KLOR-CON) 10 MEQ tablet TAKE 1 TABLET BY MOUTH 2 TIMES DAILY.   prednisoLONE acetate (PRED FORTE) 1 % ophthalmic suspension Place 1 drop into both eyes daily.    rosuvastatin (CRESTOR) 40 MG tablet Take 1 tablet (40 mg total) by mouth daily.   timolol (TIMOPTIC) 0.5 % ophthalmic solution Place 1 drop into both eyes every morning.   amoxicillin-clavulanate (AUGMENTIN) 875-125 MG tablet Take 1 tablet by mouth every 12 (twelve) hours. (Patient not taking: Reported on 04/05/2023)   No facility-administered encounter medications on file as of 04/05/2023.    Allergies (verified) Aspirin and Ibuprofen   History: Past Medical History:  Diagnosis Date   Allergy    Arthritis    Asthma    Hypercholesterolemia    Hypertension    PONV (postoperative nausea and vomiting)    PUD (peptic ulcer disease)    Scleroderma (HCC)    Past Surgical History:  Procedure Laterality Date  arthroscopy     CYST REMOVAL HAND     EYE SURGERY Left 10/25/2017   laser surgery    KNEE ARTHROPLASTY     Family History  Problem Relation Age of Onset   Cancer Father    Ulcers Sister    High Cholesterol Sister    Ulcers Brother    Heart disease Brother 20       MI   Cancer Maternal Grandmother    Heart disease Maternal Grandfather    Social History   Socioeconomic History   Marital status: Divorced    Spouse name: Not on file   Number of children: Not on file   Years of education: Not on file   Highest education level: Not on file  Occupational  History   Not on file  Tobacco Use   Smoking status: Never    Passive exposure: Never   Smokeless tobacco: Never  Vaping Use   Vaping Use: Never used  Substance and Sexual Activity   Alcohol use: No   Drug use: No   Sexual activity: Not Currently  Other Topics Concern   Not on file  Social History Narrative   Not on file   Social Determinants of Health   Financial Resource Strain: Low Risk  (04/04/2023)   Overall Financial Resource Strain (CARDIA)    Difficulty of Paying Living Expenses: Not hard at all  Food Insecurity: No Food Insecurity (04/04/2023)   Hunger Vital Sign    Worried About Running Out of Food in the Last Year: Never true    Ran Out of Food in the Last Year: Never true  Transportation Needs: No Transportation Needs (04/04/2023)   PRAPARE - Administrator, Civil Service (Medical): No    Lack of Transportation (Non-Medical): No  Physical Activity: Insufficiently Active (04/04/2023)   Exercise Vital Sign    Days of Exercise per Week: 3 days    Minutes of Exercise per Session: 20 min  Stress: No Stress Concern Present (04/04/2023)   [employer] of Occupational Health - Occupational Stress Questionnaire    Feeling of Stress : Not at all  Social Connections: Unknown (04/04/2023)   Social Connection and Isolation Panel [NHANES]    Frequency of Communication with Friends and Family: Twice a week    Frequency of Social Gatherings with Friends and Family: Once a week    Attends Religious Services: Not on Marketing executive or Organizations: No    Attends Engineer, structural: Not on file    Marital Status: Divorced    Tobacco Counseling Counseling given: Not Answered   Clinical Intake:  Pre-visit preparation completed: Yes  Pain : No/denies pain     Nutritional Status: BMI 25 -29 Overweight Nutritional Risks: None Diabetes: No  How often do you need to have someone help you when you read instructions, pamphlets, or  other written materials from your doctor or pharmacy?: 1 - Never  Interpreter Needed?: No  Information entered by :: NAllen LPN   Activities of Daily Living    04/04/2023    8:46 AM 03/07/2023    1:09 PM  In your present state of health, do you have any difficulty performing the following activities:  Hearing? 0   Vision? 0   Difficulty concentrating or making decisions? 0   Walking or climbing stairs? 1   Dressing or bathing? 0   Doing errands, shopping? 0 0  Preparing Food and eating ? N  Using the Toilet? N   In the past six months, have you accidently leaked urine? Y   Do you have problems with loss of bowel control? N   Managing your Medications? N   Managing your Finances? N   Housekeeping or managing your Housekeeping? N     Patient Care Team: Ronnald Nian, MD as PCP - General (Family Medicine) Bensimhon, Bevelyn Buckles, MD as PCP - Cardiology (Cardiology)  Indicate any recent Medical Services you may have received from other than [facility] providers in the past year (date may be approximate).     Assessment:   This is a routine wellness examination for Rhonda.  Hearing/Vision screen Vision Screening - Comments:: Regular eye exams, [facility]  Dietary issues and exercise activities discussed:     Goals Addressed             This Visit's Progress    Patient Stated       04/05/2023, no goals       Depression Screen    04/05/2023    2:34 PM 04/22/2022   10:06 AM 04/15/2021   10:05 AM 04/14/2020    9:55 AM 12/21/2018   10:52 AM 10/19/2017   11:11 AM 10/05/2016    2:07 PM  PHQ 2/9 Scores  PHQ - 2 Score 0 0 0 0 4 0 0  PHQ- 9 Score 6    13      Fall Risk    04/04/2023    8:46 AM 01/04/2023    3:43 PM 04/22/2022   10:05 AM 04/15/2021   10:04 AM 04/14/2020    9:55 AM  Fall Risk   Falls in the past year? 0 0 0 0 0  Number falls in past yr: 0 0 0 0   Injury with Fall? 0 0 0 0   Risk for fall due to : Medication side effect No Fall Risks No Fall Risks No Fall  Risks   Follow up Falls prevention discussed;Education provided;Falls evaluation completed Falls evaluation completed Falls evaluation completed Falls evaluation completed     MEDICARE RISK AT HOME:  Medicare Risk at Home - 04/05/23 1434     Any stairs in or around the home? Yes    If so, are there any without handrails? No    Home free of loose throw rugs in walkways, pet beds, electrical cords, etc? Yes    Adequate lighting in your home to reduce risk of falls? Yes    Life alert? No    Use of a cane, walker or w/c? No    Grab bars in the bathroom? No    Shower chair or bench in shower? No    Elevated toilet seat or a handicapped toilet? No             TIMED UP AND GO:  Was the test performed?  No    Cognitive Function:        04/05/2023    2:36 PM 04/22/2022   10:08 AM 10/19/2017   11:13 AM  6CIT Screen  What Year? 0 points 0 points 0 points  What month? 0 points 0 points 0 points  What time? 0 points 0 points 0 points  Count back from 20 2 points 0 points 0 points  Months in reverse 0 points 0 points 0 points  Repeat phrase 2 points 0 points 0 points  Total Score 4 points 0 points 0 points    Immunizations Immunization History  Administered Date(s)  Administered   COVID-19, mRNA, vaccine(Comirnaty)12 years and older 08/15/2022   Fluad Quad(high Dose 65+) 12/19/2020, 09/29/2021, 08/15/2022   H1N1 08/22/2009   Influenza, High Dose Seasonal PF 06/24/2015, 10/05/2016, 08/04/2018   Influenza,inj,Quad PF,6+ Mos 07/12/2013, 01/15/2015   Influenza-Unspecified 06/24/2015, 10/05/2016, 12/24/2017, 08/04/2018   PFIZER(Purple Top)SARS-COV-2 Vaccination 12/13/2019, 01/08/2020, 10/03/2020   Pfizer Covid-19 Vaccine Bivalent Booster 93yrs & up 09/29/2021   Pneumococcal Conjugate-13 05/20/2014   Pneumococcal Polysaccharide-23 01/31/2007   Tdap 01/31/2007, 12/19/2020   Zoster, Live 07/02/2008    TDAP status: Up to date  Flu Vaccine status: Up to date  Pneumococcal vaccine  status: Up to date  Covid-19 vaccine status: Completed vaccines  Qualifies for Shingles Vaccine? Yes   Zostavax completed Yes   Shingrix Completed?: No.    Education has been provided regarding the importance of this vaccine. Patient has been advised to call insurance company to determine out of pocket expense if they have not yet received this vaccine. Advised may also receive vaccine at local pharmacy or Health Dept. Verbalized acceptance and understanding.  Screening Tests Health Maintenance  Topic Date Due   Zoster Vaccines- Shingrix (1 of 2) Never done   COVID-19 Vaccine (6 - 2023-24 season) 10/10/2022   Medicare Annual Wellness (AWV)  04/23/2023   Pneumonia Vaccine 49+ Years old (3 of 3 - PPSV23 or PCV20) 01/16/2029 (Originally 05/21/2019)   INFLUENZA VACCINE  05/19/2023   DTaP/Tdap/Td (3 - Td or Tdap) 12/20/2030   DEXA SCAN  Completed   Hepatitis C Screening  Completed   HPV VACCINES  Aged Out   Colonoscopy  Discontinued   Fecal DNA (Cologuard)  Discontinued    Health Maintenance  Health Maintenance Due  Topic Date Due   Zoster Vaccines- Shingrix (1 of 2) Never done   COVID-19 Vaccine (6 - 2023-24 season) 10/10/2022   Medicare Annual Wellness (AWV)  04/23/2023    Colorectal cancer screening: Type of screening: Cologuard. Completed 05/13/2020. Repeat every 3 years  Mammogram status: Completed 07/29/2022. Repeat every year  Bone Density status: Completed 04/04/2013.   Lung Cancer Screening: (Low Dose CT Chest recommended if Age 41-80 years, 20 pack-year currently smoking OR have quit w/in 15years.) does not qualify.   Lung Cancer Screening Referral: no  Additional Screening:  Hepatitis C Screening: does qualify; Completed 10/19/2017  Vision Screening: Recommended annual ophthalmology exams for early detection of glaucoma and other disorders of the eye. Is the patient up to date with their annual eye exam?  Yes  Who is the provider or what is the name of the office in  which the patient attends annual eye exams? [facility] If pt is not established with a provider, would they like to be referred to a provider to establish care? No .   Dental Screening: Recommended annual dental exams for proper oral hygiene  Diabetic Foot Exam: n/a  Community Resource Referral / Chronic Care Management: CRR required this visit?  No   CCM required this visit?  No     Plan:     I have personally reviewed and noted the following in the patient's chart:   Medical and social history Use of alcohol, tobacco or illicit drugs  Current medications and supplements including opioid prescriptions. Patient is not currently taking opioid prescriptions. Functional ability and status Nutritional status Physical activity Advanced directives List of other physicians Hospitalizations, surgeries, and ER visits in previous 12 months Vitals Screenings to include cognitive, depression, and falls Referrals and appointments  In addition, I have  reviewed and discussed with patient certain preventive protocols, quality metrics, and best practice recommendations. A written personalized care plan for preventive services as well as general preventive health recommendations were provided to patient.     Barb Merino, LPN   1/61/0960   After Visit Summary: (MyChart) Due to this being a telephonic visit, the after visit summary with patients personalized plan was offered to patient via MyChart   Nurse Notes: none

## 2023-04-06 ENCOUNTER — Other Ambulatory Visit: Payer: Self-pay | Admitting: Family Medicine

## 2023-04-06 DIAGNOSIS — I10 Essential (primary) hypertension: Secondary | ICD-10-CM

## 2023-04-26 ENCOUNTER — Encounter: Payer: Self-pay | Admitting: Family Medicine

## 2023-04-26 ENCOUNTER — Ambulatory Visit (INDEPENDENT_AMBULATORY_CARE_PROVIDER_SITE_OTHER): Payer: Medicare Other | Admitting: Family Medicine

## 2023-04-26 VITALS — BP 138/68 | HR 65 | Temp 98.2°F | Resp 14 | Ht 65.5 in | Wt 168.4 lb

## 2023-04-26 DIAGNOSIS — H20023 Recurrent acute iridocyclitis, bilateral: Secondary | ICD-10-CM

## 2023-04-26 DIAGNOSIS — J301 Allergic rhinitis due to pollen: Secondary | ICD-10-CM

## 2023-04-26 DIAGNOSIS — I10 Essential (primary) hypertension: Secondary | ICD-10-CM

## 2023-04-26 DIAGNOSIS — Z0000 Encounter for general adult medical examination without abnormal findings: Secondary | ICD-10-CM

## 2023-04-26 DIAGNOSIS — H2013 Chronic iridocyclitis, bilateral: Secondary | ICD-10-CM | POA: Diagnosis not present

## 2023-04-26 DIAGNOSIS — I2510 Atherosclerotic heart disease of native coronary artery without angina pectoris: Secondary | ICD-10-CM

## 2023-04-26 DIAGNOSIS — M1990 Unspecified osteoarthritis, unspecified site: Secondary | ICD-10-CM

## 2023-04-26 DIAGNOSIS — H04123 Dry eye syndrome of bilateral lacrimal glands: Secondary | ICD-10-CM | POA: Diagnosis not present

## 2023-04-26 DIAGNOSIS — J45909 Unspecified asthma, uncomplicated: Secondary | ICD-10-CM | POA: Diagnosis not present

## 2023-04-26 DIAGNOSIS — E785 Hyperlipidemia, unspecified: Secondary | ICD-10-CM | POA: Diagnosis not present

## 2023-04-26 DIAGNOSIS — I350 Nonrheumatic aortic (valve) stenosis: Secondary | ICD-10-CM

## 2023-04-26 DIAGNOSIS — I700 Atherosclerosis of aorta: Secondary | ICD-10-CM

## 2023-04-26 DIAGNOSIS — H40043 Steroid responder, bilateral: Secondary | ICD-10-CM | POA: Diagnosis not present

## 2023-04-26 DIAGNOSIS — H40053 Ocular hypertension, bilateral: Secondary | ICD-10-CM | POA: Diagnosis not present

## 2023-04-26 DIAGNOSIS — L940 Localized scleroderma [morphea]: Secondary | ICD-10-CM | POA: Diagnosis not present

## 2023-04-26 DIAGNOSIS — I739 Peripheral vascular disease, unspecified: Secondary | ICD-10-CM

## 2023-04-26 DIAGNOSIS — H47011 Ischemic optic neuropathy, right eye: Secondary | ICD-10-CM | POA: Diagnosis not present

## 2023-04-26 DIAGNOSIS — M349 Systemic sclerosis, unspecified: Secondary | ICD-10-CM

## 2023-04-26 DIAGNOSIS — H40023 Open angle with borderline findings, high risk, bilateral: Secondary | ICD-10-CM | POA: Diagnosis not present

## 2023-04-26 LAB — LIPID PANEL
Chol/HDL Ratio: 3 ratio (ref 0.0–4.4)
Cholesterol, Total: 213 mg/dL — ABNORMAL HIGH (ref 100–199)
HDL: 71 mg/dL (ref >39)
LDL Chol Calc (NIH): 123 mg/dL — ABNORMAL HIGH (ref 0–99)
Triglycerides: 110 mg/dL (ref 0–149)
VLDL Cholesterol Cal: 19 mg/dL (ref 5–40)

## 2023-04-26 LAB — COMPREHENSIVE METABOLIC PANEL
ALT: 26 IU/L (ref 0–32)
AST: 28 IU/L (ref 0–40)
Albumin: 4.3 g/dL (ref 3.8–4.8)
Alkaline Phosphatase: 90 IU/L (ref 44–121)
BUN/Creatinine Ratio: 10 — ABNORMAL LOW (ref 12–28)
BUN: 12 mg/dL (ref 8–27)
Bilirubin Total: 0.4 mg/dL (ref 0.0–1.2)
CO2: 21 mmol/L (ref 20–29)
Calcium: 10.2 mg/dL (ref 8.7–10.3)
Chloride: 105 mmol/L (ref 96–106)
Creatinine, Ser: 1.24 mg/dL — ABNORMAL HIGH (ref 0.57–1.00)
Globulin, Total: 2.7 g/dL (ref 1.5–4.5)
Glucose: 96 mg/dL (ref 70–99)
Potassium: 3.7 mmol/L (ref 3.5–5.2)
Sodium: 141 mmol/L (ref 134–144)
Total Protein: 7 g/dL (ref 6.0–8.5)
eGFR: 45 mL/min/{1.73_m2} — ABNORMAL LOW (ref >59)

## 2023-04-26 MED ORDER — POTASSIUM CHLORIDE ER 10 MEQ PO TBCR: 10.0000 meq | EXTENDED_RELEASE_TABLET | Freq: Two times a day (BID) | ORAL | 0 refills | Status: DC

## 2023-04-26 MED ORDER — ROSUVASTATIN CALCIUM 40 MG PO TABS: 40.0000 mg | ORAL_TABLET | Freq: Every day | ORAL | 3 refills | Status: DC

## 2023-04-26 MED ORDER — LISINOPRIL-HYDROCHLOROTHIAZIDE 10-12.5 MG PO TABS: 1.0000 | ORAL_TABLET | Freq: Every day | ORAL | 3 refills | Status: DC

## 2023-04-26 NOTE — Patient Instructions (Signed)
Try taking either Flonase or Rhinocort to help with your allergy symptoms.

## 2023-04-26 NOTE — Progress Notes (Signed)
Complete physical exam  Patient: Rhonda Buchanan   DOB: 14-Jun-1946   77 y.o. Female  MRN: [id number]  Subjective:    Chief Complaint  Patient presents with   Annual Exam    Fasting. Had AWV with [facility] on 04/05/2023.     Rhonda TRINIDEE SCHRAG is a 77 y.o. female who presents today for a complete physical exam. She reports consuming a general diet. Home exercise routine includes walking. She generally feels fairly well. She reports sleeping fairly well. She does have additional problems to discuss today (has a ringing in both ears).  Her knee surgery was postponed due to low potassium.  She has been on potassium supplements and will need follow-up on that.  She continues to be followed by 2 specialist for her underlying scleroderma and she continues on methotrexate.  She has had some difficulty with her allergies.  Also recently she has noted some slight ringing in the ears.  She continues on lisinopril/HCTZ, Crestor.  Has no particular concerns or complaints about that.   Most recent fall risk assessment:    04/04/2023    8:46 AM  Fall Risk   Falls in the past year? 0  Number falls in past yr: 0  Injury with Fall? 0  Risk for fall due to : Medication side effect  Follow up Falls prevention discussed;Education provided;Falls evaluation completed     Most recent depression screenings:    04/05/2023    2:34 PM 04/22/2022   10:06 AM  PHQ 2/9 Scores  PHQ - 2 Score 0 0  PHQ- 9 Score 6     Vision:Within last year and Dental: Receives regular dental care    Patient Care Team: Ronnald Nian, MD as PCP - General (Family Medicine) Bensimhon, Bevelyn Buckles, MD as PCP - Cardiology (Cardiology)   Outpatient Medications Prior to Visit  Medication Sig   Brinzolamide-Brimonidine 1-0.2 % SUSP Place 1 drop into both eyes 3 (three) times daily.   folic acid (FOLVITE) 1 MG tablet Take 3 mg by mouth daily. Hold on the day se takes Methotrexate   methotrexate (RHEUMATREX) 2.5 MG tablet Take 15 mg by  mouth once a week. Caution:Chemotherapy. Protect from light.   metoprolol succinate (TOPROL-XL) 50 MG 24 hr tablet TAKE 1/2 TABLET BY MOUTH DAILY. TAKE WITH OR IMMEDIATELY FOLLOWING A MEAL.   multivitamin (THERAGRAN) per tablet Take 1 tablet by mouth daily.   prednisoLONE acetate (PRED FORTE) 1 % ophthalmic suspension Place 1 drop into both eyes daily.    timolol (TIMOPTIC) 0.5 % ophthalmic solution Place 1 drop into both eyes every morning.   chlorhexidine (PERIDEX) 0.12 % solution Use as directed 15 mLs in the mouth or throat 2 (two) times daily. (Patient not taking: Reported on 04/26/2023)   [DISCONTINUED] amoxicillin-clavulanate (AUGMENTIN) 875-125 MG tablet Take 1 tablet by mouth every 12 (twelve) hours. (Patient not taking: Reported on 04/05/2023)   [DISCONTINUED] lisinopril-hydrochlorothiazide (ZESTORETIC) 10-12.5 MG tablet Take 1 tablet by mouth daily.   [DISCONTINUED] potassium chloride (KLOR-CON) 10 MEQ tablet TAKE 1 TABLET BY MOUTH 2 TIMES DAILY.   [DISCONTINUED] rosuvastatin (CRESTOR) 40 MG tablet Take 1 tablet (40 mg total) by mouth daily.   No facility-administered medications prior to visit.    Review of Systems  All other systems reviewed and are negative.         Objective:     BP 138/68   Pulse 65   Temp 98.2 F (36.8 C) (Oral)   Resp 14  Ht 5' 5.5" (1.664 m)   Wt 168 lb 6.4 oz (76.4 kg)   LMP 02/16/2003   SpO2 98% Comment: room air  BMI 27.60 kg/m    Physical Exam  Alert and in no distress. Tympanic membranes and canals are normal. Pharyngeal area is normal. Neck is supple without adenopathy or thyromegaly. Cardiac exam shows a regular sinus rhythm with a grade 2 SEM lungs are clear to auscultation. Review of the record does show a slightly abnormal ABI on the right.     Assessment & Plan:   Routine general medical examination at a health care facility  Allergic rhinitis due to pollen, unspecified seasonality  Aortic atherosclerosis (HCC) - Plan: Lipid  panel  Nonrheumatic aortic valve stenosis  Arthritis  Asthma due to environmental allergies  Atherosclerosis of coronary artery of native heart without angina pectoris, unspecified vessel or lesion type - Plan: Lipid panel, rosuvastatin (CRESTOR) 40 MG tablet  Generalized morphea  Hyperlipidemia, unspecified hyperlipidemia type - Plan: rosuvastatin (CRESTOR) 40 MG tablet  Primary hypertension - Plan: Comprehensive metabolic panel, potassium chloride (KLOR-CON) 10 MEQ tablet  PAD (peripheral artery disease) (HCC)  Recurrent acute iridocyclitis of both eyes  Scleroderma (HCC)  Essential hypertension - Plan: lisinopril-hydrochlorothiazide (ZESTORETIC) 10-12.5 MG tablet  Immunization History  Administered Date(s) Administered   COVID-19, mRNA, vaccine(Comirnaty)12 years and older 08/15/2022   Fluad Quad(high Dose 65+) 12/19/2020, 09/29/2021, 08/15/2022   H1N1 08/22/2009   Influenza, High Dose Seasonal PF 06/24/2015, 10/05/2016, 08/04/2018   Influenza,inj,Quad PF,6+ Mos 07/12/2013, 01/15/2015   Influenza-Unspecified 06/24/2015, 10/05/2016, 12/24/2017, 08/04/2018   PFIZER(Purple Top)SARS-COV-2 Vaccination 12/13/2019, 01/08/2020, 10/03/2020   Pfizer Covid-19 Vaccine Bivalent Booster 61yrs & up 09/29/2021   Pneumococcal Conjugate-13 05/20/2014   Pneumococcal Polysaccharide-23 01/31/2007   Tdap 01/31/2007, 12/19/2020   Zoster, Live 07/02/2008    Health Maintenance  Topic Date Due   Zoster Vaccines- Shingrix (1 of 2) Never done   COVID-19 Vaccine (6 - 2023-24 season) 10/10/2022   Pneumonia Vaccine 60+ Years old (3 of 3 - PPSV23 or PCV20) 01/16/2029 (Originally 05/21/2019)   INFLUENZA VACCINE  05/19/2023   Medicare Annual Wellness (AWV)  04/04/2024   DTaP/Tdap/Td (3 - Td or Tdap) 12/20/2030   DEXA SCAN  Completed   Hepatitis C Screening  Completed   HPV VACCINES  Aged Out   Colonoscopy  Discontinued   Fecal DNA (Cologuard)  Discontinued     Problem List Items Addressed This  Visit     Allergic rhinitis (Chronic)   Arthritis (Chronic)   Hyperlipidemia (Chronic)   Relevant Medications   rosuvastatin (CRESTOR) 40 MG tablet   lisinopril-hydrochlorothiazide (ZESTORETIC) 10-12.5 MG tablet   Hypertension (Chronic)   Relevant Medications   rosuvastatin (CRESTOR) 40 MG tablet   potassium chloride (KLOR-CON) 10 MEQ tablet   lisinopril-hydrochlorothiazide (ZESTORETIC) 10-12.5 MG tablet   Other Relevant Orders   Comprehensive metabolic panel   Aortic atherosclerosis (HCC)   Relevant Medications   rosuvastatin (CRESTOR) 40 MG tablet   lisinopril-hydrochlorothiazide (ZESTORETIC) 10-12.5 MG tablet   Other Relevant Orders   Lipid panel   Asthma due to environmental allergies   Atherosclerosis of coronary artery of native heart without angina pectoris   Relevant Medications   rosuvastatin (CRESTOR) 40 MG tablet   lisinopril-hydrochlorothiazide (ZESTORETIC) 10-12.5 MG tablet   Other Relevant Orders   Lipid panel   Generalized morphea   PAD (peripheral artery disease) (HCC)   Relevant Medications   rosuvastatin (CRESTOR) 40 MG tablet   lisinopril-hydrochlorothiazide (ZESTORETIC) 10-12.5 MG tablet  Recurrent acute iridocyclitis of both eyes   Scleroderma (HCC)   Other Visit Diagnoses     Routine general medical examination at a health care facility    -  Primary   Nonrheumatic aortic valve stenosis       Relevant Medications   rosuvastatin (CRESTOR) 40 MG tablet   lisinopril-hydrochlorothiazide (ZESTORETIC) 10-12.5 MG tablet   Essential hypertension       Relevant Medications   rosuvastatin (CRESTOR) 40 MG tablet   lisinopril-hydrochlorothiazide (ZESTORETIC) 10-12.5 MG tablet      Recommend trying a nasal steroid spray to help with the allergies.  Discussed the ringing in years and at this point no intervention really needed.  Continue to follow-up with orthopedics concerning the knee.  She has had an echo so we might need to get a follow-up echo in  another year or 2.  Continue on present medications.  And follow-up with the specialists.  Discussed leg pain with physical activity and she will keep me informed concerning that.  Sharlot Gowda, MD

## 2023-07-01 ENCOUNTER — Other Ambulatory Visit: Payer: Self-pay | Admitting: Family Medicine

## 2023-07-01 DIAGNOSIS — I10 Essential (primary) hypertension: Secondary | ICD-10-CM

## 2023-07-07 ENCOUNTER — Other Ambulatory Visit (HOSPITAL_COMMUNITY): Payer: Self-pay

## 2023-07-07 DIAGNOSIS — I5022 Chronic systolic (congestive) heart failure: Secondary | ICD-10-CM

## 2023-07-18 DIAGNOSIS — L940 Localized scleroderma [morphea]: Secondary | ICD-10-CM | POA: Diagnosis not present

## 2023-07-18 DIAGNOSIS — Z79631 Long term (current) use of antimetabolite agent: Secondary | ICD-10-CM | POA: Diagnosis not present

## 2023-08-04 DIAGNOSIS — Z1231 Encounter for screening mammogram for malignant neoplasm of breast: Secondary | ICD-10-CM | POA: Diagnosis not present

## 2023-08-04 LAB — HM MAMMOGRAPHY

## 2023-08-08 NOTE — Progress Notes (Unsigned)
Office Visit Note  Patient: Rhonda Buchanan             Date of Birth: 1946-06-29           MRN: [id number]             PCP: Ronnald Nian, MD Referring: Ronnald Nian, MD Visit Date: 08/22/2023 Occupation: @GUAROCC @  Subjective:  Fatigue   History of Present Illness: Rhonda Buchanan is a 77 y.o. female with history of scleroderma, morphea, and osteoarthritis.  Patient remains on  Methotrexate 4 tablets by mouth once weekly and folic acid 3 mg p.o. daily.  Patient states that she reduce methotrexate from 5 tablets once weekly to 4 tablets weekly starting last week.  Patient states that she would like to continue to try to reduce methotrexate if possible due to elevated creatinine.  Patient continues to have chronic fatigue and daytime drowsiness.  She has been sleeping well at night but wakes up not feeling restored.  She denies any signs or symptoms of an iritis flare and has upcoming appointment with her ophthalmologist in December 2024.  She denies any new or worsening morphea lesions.  She was evaluated by dermatology on 07/18/2023.  She denies any increase skin tightness or thickening.  She has not noticed any symptoms of Raynaud's phenomenon.  She denies any new or worsening pulmonary symptoms.  Patient states she has had some increased discomfort in the left elbow over the past several months and her symptoms have gradually started to improve.  She denies any nocturnal pain.  She denies any joint swelling.  She denies any recent or recurrent infections.  Patient is planning on proceeding with annual flu shot and COVID booster soon.   Activities of Daily Living:  Patient reports morning stiffness for 5 minutes.   Patient Reports nocturnal pain.  Difficulty dressing/grooming: Reports Difficulty climbing stairs: Reports Difficulty getting out of chair: Reports Difficulty using hands for taps, buttons, cutlery, and/or writing: Reports  Review of Systems  Constitutional:  Positive  for fatigue.  HENT:  Negative for mouth sores and mouth dryness.   Eyes:  Positive for dryness. Negative for pain, redness, itching and visual disturbance.  Respiratory:  Negative for shortness of breath.   Cardiovascular:  Negative for chest pain and palpitations.  Gastrointestinal:  Negative for blood in stool, constipation and diarrhea.  Endocrine: Negative for increased urination.  Genitourinary:  Negative for involuntary urination.  Musculoskeletal:  Positive for joint pain, gait problem, joint pain, myalgias, morning stiffness, muscle tenderness and myalgias. Negative for joint swelling and muscle weakness.  Skin:  Positive for hair loss. Negative for color change, rash and sensitivity to sunlight.  Allergic/Immunologic: Negative for susceptible to infections.  Neurological:  Negative for dizziness and headaches.  Hematological:  Negative for swollen glands.  Psychiatric/Behavioral:  Negative for depressed mood and sleep disturbance. The patient is not nervous/anxious.     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  Scleroderma (HCC) 07/12/2013   Hypertension 03/04/2011   Hyperlipidemia 03/04/2011   Allergic rhinitis 03/04/2011   Arthritis 03/04/2011   Migraine variant 04/18/1995    Past Medical History:  Diagnosis Date   Allergy    Arthritis    Asthma    Hypercholesterolemia    Hypertension    PONV (postoperative nausea  and vomiting)    PUD (peptic ulcer disease)    Scleroderma (HCC)     Family History  Problem Relation Age of Onset   Cancer Father    Ulcers Sister    High Cholesterol Sister    Ulcers Brother    Heart disease Brother 57       MI   Cancer Maternal Grandmother    Heart disease Maternal Grandfather    Past Surgical History:  Procedure Laterality Date   arthroscopy     CYST REMOVAL HAND     EYE SURGERY Left 10/25/2017   laser surgery    KNEE ARTHROPLASTY     Social History   Social History Narrative   Not on file   Immunization History  Administered Date(s) Administered   Fluad Quad(high Dose 65+) 12/19/2020, 09/29/2021, 08/15/2022   H1N1 08/22/2009   Influenza, High Dose Seasonal PF 06/24/2015, 10/05/2016, 08/04/2018   Influenza,inj,Quad PF,6+ Mos 07/12/2013, 01/15/2015   Influenza-Unspecified 06/24/2015, 10/05/2016, 12/24/2017, 08/04/2018   PFIZER(Purple Top)SARS-COV-2 Vaccination 12/13/2019, 01/08/2020, 10/03/2020   Pfizer Covid-19 Vaccine Bivalent Booster 37yrs & up 09/29/2021   Pfizer(Comirnaty)Fall Seasonal Vaccine 12 years and older 08/15/2022   Pneumococcal Conjugate-13 05/20/2014   Pneumococcal Polysaccharide-23 01/31/2007   Tdap 01/31/2007, 12/19/2020   Zoster, Live 07/02/2008     Objective: Vital Signs: BP 128/79 (BP Location: Left Arm, Patient Position: Sitting, Cuff Size: Normal)   Pulse 72   Resp 16   Ht 5' 5.5" (1.664 m)   Wt 170 lb 3.2 oz (77.2 kg)   LMP 02/16/2003   BMI 27.89 kg/m    Physical Exam Vitals and nursing note reviewed.  Constitutional:      Appearance: She is well-developed.  HENT:     Head: Normocephalic and atraumatic.  Eyes:     Conjunctiva/sclera: Conjunctivae normal.  Cardiovascular:     Rate and Rhythm: Normal rate and regular rhythm.     Heart sounds: Normal heart sounds.  Pulmonary:     Effort: Pulmonary effort is normal.     Breath sounds: Normal breath sounds.  Abdominal:     General: Bowel sounds are normal.      Palpations: Abdomen is soft.  Musculoskeletal:     Cervical back: Normal range of motion.  Lymphadenopathy:     Cervical: No cervical adenopathy.  Skin:    General: Skin is warm and dry.     Capillary Refill: Capillary refill takes less than 2 seconds.  Neurological:     Mental Status: She is alert and oriented to person, place, and time.  Psychiatric:        Behavior: Behavior normal.      Musculoskeletal Exam: C-spine, thoracic spine, lumbar spine good range of motion.  Shoulder joints have good range of motion with no discomfort.  Some tenderness over the medial epicondyle of the left elbow.  No tenderness along the joint line.  Wrist joints have good range of motion with no tenderness or synovitis.  Bilateral CMC, PIP, DIP thickening noted.  Hip joints have good range of motion with no groin pain.  Limited extension of both knees, discomfort with ROM of the right knee.  No effusion noted.  Ankle joints have good ROM with no tenderness or joint swelling.   CDAI Exam: CDAI Score: -- Patient Global: --; Provider Global: -- Swollen: --; Tender: -- Joint Exam 08/22/2023   No joint exam has been documented for this visit   There is currently no information documented on the homunculus. Go to the Rheumatology activity and complete the homunculus joint exam.  Investigation: No additional findings.  Imaging: No results found.  Recent Labs: Lab Results  Component Value Date   WBC 4.6 03/07/2023   HGB 13.0 03/07/2023   PLT 196 03/07/2023   NA 141 04/26/2023   K 3.7 04/26/2023   CL 105 04/26/2023   CO2 21 04/26/2023   GLUCOSE 96 04/26/2023   BUN 12 04/26/2023   CREATININE 1.24 (H) 04/26/2023   BILITOT 0.4 04/26/2023   ALKPHOS 90 04/26/2023   AST 28 04/26/2023   ALT 26 04/26/2023   PROT 7.0 04/26/2023   ALBUMIN 4.3 04/26/2023   CALCIUM 10.2 04/26/2023   GFRAA 62 08/01/2020    Speciality Comments: No specialty comments available.  Procedures:  No procedures  performed Allergies: Aspirin and Ibuprofen     Assessment / Plan:     Visit Diagnoses: Scleroderma (HCC) - History of morphea, Raynauds, recurrent iridocyclitis.  She remains under the care of Dr. Toney Reil for management of morphea.  She has not noticed any progression.  No new or worsening symptoms.  Patient is currently taking methotrexate 4 tablets weekly with close lab monitoring.  She has not had any symptoms of Raynaud's phenomenon.  No new or worsening pulmonary symptoms.  No recent iritis flares.  No synovitis noted on examination today.  She will remain on low-dose methotrexate as prescribed.  She was advised to notify us if she develops any new or worsening symptoms.  She will follow up in 6 months or sooner if needed.   Morphea scleroderma - Followed at [facility] dermatology by Dr. Rema Jasmine progression.  She had hyperpigmented lesions on her abdomen and back.  No new or worsening lesions.  Patient was evaluated by her dermatologist on 07/18/2023--recommended continuing on methotrexate 5 tablets weekly-starting last week the patient reduced methotrexate to 4 tablets weekly due to the concern for chronic kidney disease.  Patient will monitor symptoms closely and will reach out to her dermatologist if she notices any new or worsening symptoms.  High risk medication use - Methotrexate 4 tablets by mouth once weekly and folic acid 2 mg p.o. daily. Elevated creatinine--patient has reduced the dose of methotrexate to 4 tablets weekly starting last week.  CBC, AST, ALT, creatinine, and BUN updated on 07/18/23. No recent or recurrent infections.  Discussed the importance of holding methotrexate if she develops signs or symptoms of an infection and to resume once the infection has completely cleared.  She is planning on receiving annual flu shot and COVID vaccine--she will hold methotrexate 1 week after as recommended.   Positive PPD, treated  Raynaud's syndrome without gangrene: Not currently symptomatic.   No digital ulcerations or signs of gangrene were noted.  Patient was advised to notify us if she develops any new or worsening symptoms.  Recurrent acute iridocyclitis of both eyes - Evaluated by Dr. Renette Butters appointment scheduled in December 2024.  No signs or symptoms of an iritis flare.  Patient will remain on methotrexate 4 tablets by mouth once weekly.  She will notify us if she starts to have more recurrent flares on the reduced dose of methotrexate.  Primary  osteoarthritis of both hands: CMC, PIP, DIP thickening consistent with osteoarthritis of both hands.  No synovitis noted.  Primary osteoarthritis of both knees: Limited extension of both knees.  Discomfort with range of motion of the right knee.  No effusion noted.  Patient was scheduled to have her right knee replaced by Dr. Linna Caprice but had to postpone due to hypokalemia.   Primary osteoarthritis of both feet: She is not experiencing any increased discomfort in her feet at this time.  She is wearing proper fitting shoes.  Bilateral calcaneal spurs  Other medical conditions are listed as follows:   History of hyperlipidemia  History of migraine  History of hypertension  Orders: No orders of the defined types were placed in this encounter.  No orders of the defined types were placed in this encounter.    Follow-Up Instructions: Return in about 6 months (around 02/19/2024) for Scleroderma, Osteoarthritis.   Rhonda Bienenstock, PA-C  Note - This record has been created using Dragon software.  Chart creation errors have been sought, but may not always  have been located. Such creation errors do not reflect on  the standard of medical care.

## 2023-08-09 ENCOUNTER — Encounter: Payer: Self-pay | Admitting: *Deleted

## 2023-08-22 ENCOUNTER — Ambulatory Visit: Payer: Medicare Other | Attending: Physician Assistant | Admitting: Physician Assistant

## 2023-08-22 ENCOUNTER — Encounter: Payer: Self-pay | Admitting: Physician Assistant

## 2023-08-22 VITALS — BP 128/79 | HR 72 | Resp 16 | Ht 65.5 in | Wt 170.2 lb

## 2023-08-22 DIAGNOSIS — Z79899 Other long term (current) drug therapy: Secondary | ICD-10-CM | POA: Diagnosis not present

## 2023-08-22 DIAGNOSIS — R7611 Nonspecific reaction to tuberculin skin test without active tuberculosis: Secondary | ICD-10-CM

## 2023-08-22 DIAGNOSIS — H20023 Recurrent acute iridocyclitis, bilateral: Secondary | ICD-10-CM | POA: Diagnosis not present

## 2023-08-22 DIAGNOSIS — M19041 Primary osteoarthritis, right hand: Secondary | ICD-10-CM | POA: Diagnosis not present

## 2023-08-22 DIAGNOSIS — Z8669 Personal history of other diseases of the nervous system and sense organs: Secondary | ICD-10-CM

## 2023-08-22 DIAGNOSIS — M19071 Primary osteoarthritis, right ankle and foot: Secondary | ICD-10-CM | POA: Diagnosis not present

## 2023-08-22 DIAGNOSIS — M349 Systemic sclerosis, unspecified: Secondary | ICD-10-CM | POA: Diagnosis not present

## 2023-08-22 DIAGNOSIS — M7732 Calcaneal spur, left foot: Secondary | ICD-10-CM

## 2023-08-22 DIAGNOSIS — I7300 Raynaud's syndrome without gangrene: Secondary | ICD-10-CM

## 2023-08-22 DIAGNOSIS — Z8679 Personal history of other diseases of the circulatory system: Secondary | ICD-10-CM | POA: Diagnosis not present

## 2023-08-22 DIAGNOSIS — Z8639 Personal history of other endocrine, nutritional and metabolic disease: Secondary | ICD-10-CM

## 2023-08-22 DIAGNOSIS — L940 Localized scleroderma [morphea]: Secondary | ICD-10-CM

## 2023-08-22 DIAGNOSIS — M19042 Primary osteoarthritis, left hand: Secondary | ICD-10-CM

## 2023-08-22 DIAGNOSIS — M170 Bilateral primary osteoarthritis of knee: Secondary | ICD-10-CM

## 2023-08-22 DIAGNOSIS — M19072 Primary osteoarthritis, left ankle and foot: Secondary | ICD-10-CM

## 2023-08-22 DIAGNOSIS — M7731 Calcaneal spur, right foot: Secondary | ICD-10-CM

## 2023-09-02 DIAGNOSIS — R922 Inconclusive mammogram: Secondary | ICD-10-CM | POA: Diagnosis not present

## 2023-09-02 LAB — HM MAMMOGRAPHY

## 2023-09-06 ENCOUNTER — Encounter: Payer: Self-pay | Admitting: *Deleted

## 2023-09-07 DIAGNOSIS — N6312 Unspecified lump in the right breast, upper inner quadrant: Secondary | ICD-10-CM | POA: Diagnosis not present

## 2023-09-07 DIAGNOSIS — D0511 Intraductal carcinoma in situ of right breast: Secondary | ICD-10-CM | POA: Diagnosis not present

## 2023-09-07 HISTORY — PX: BREAST BIOPSY: SHX20

## 2023-09-09 ENCOUNTER — Telehealth: Payer: Self-pay | Admitting: Hematology and Oncology

## 2023-09-09 NOTE — Telephone Encounter (Signed)
Spoke to patient to confirm upcoming morning [facility] clinic appointment  on 1/24, paperwork will be sent via [place].   Gave location and time, also informed patient that the surgeon's office would be calling as well to get information from them similar to the packet that they will be receiving so make sure to do both.  Reminded patient that all providers will be coming to the clinic to see them HERE and if they had any questions to not hesitate to reach back out to myself or their navigators.

## 2023-09-13 ENCOUNTER — Ambulatory Visit (HOSPITAL_COMMUNITY)
Admission: RE | Admit: 2023-09-13 | Discharge: 2023-09-13 | Disposition: A | Discharge status: Home / Self Care | Payer: Medicare Other | Source: Ambulatory Visit | Attending: Internal Medicine

## 2023-09-13 ENCOUNTER — Ambulatory Visit (HOSPITAL_COMMUNITY)
Admission: RE | Admit: 2023-09-13 | Discharge: 2023-09-13 | Disposition: A | Discharge status: Home / Self Care | Payer: Medicare Other | Source: Ambulatory Visit | Attending: Internal Medicine | Admitting: Internal Medicine

## 2023-09-13 ENCOUNTER — Encounter (HOSPITAL_COMMUNITY): Payer: Self-pay | Admitting: Internal Medicine

## 2023-09-13 VITALS — BP 100/60 | HR 55 | Wt 171.0 lb

## 2023-09-13 DIAGNOSIS — I5022 Chronic systolic (congestive) heart failure: Secondary | ICD-10-CM

## 2023-09-13 DIAGNOSIS — M349 Systemic sclerosis, unspecified: Secondary | ICD-10-CM

## 2023-09-13 DIAGNOSIS — I10 Essential (primary) hypertension: Secondary | ICD-10-CM

## 2023-09-13 DIAGNOSIS — R942 Abnormal results of pulmonary function studies: Secondary | ICD-10-CM

## 2023-09-13 DIAGNOSIS — R001 Bradycardia, unspecified: Secondary | ICD-10-CM | POA: Diagnosis not present

## 2023-09-13 DIAGNOSIS — I350 Nonrheumatic aortic (valve) stenosis: Secondary | ICD-10-CM

## 2023-09-13 LAB — PULMONARY FUNCTION TEST
DL/VA % pred: 80 %
DL/VA: 3.26 ml/min/mmHg/L
DLCO unc % pred: 66 %
DLCO unc: 13.42 ml/min/mmHg
FEF 25-75 Post: 1.73 L/s
FEF 25-75 Pre: 2.39 L/s
FEF2575-%Change-Post: -27 %
FEF2575-%Pred-Post: 106 %
FEF2575-%Pred-Pre: 146 %
FEV1-%Change-Post: -6 %
FEV1-%Pred-Post: 86 %
FEV1-%Pred-Pre: 93 %
FEV1-Post: 1.92 L
FEV1-Pre: 2.06 L
FEV1FVC-%Change-Post: -5 %
FEV1FVC-%Pred-Pre: 115 %
FEV6-%Change-Post: -1 %
FEV6-%Pred-Post: 84 %
FEV6-%Pred-Pre: 85 %
FEV6-Post: 2.36 L
FEV6-Pre: 2.4 L
FEV6FVC-%Pred-Post: 105 %
FEV6FVC-%Pred-Pre: 105 %
FVC-%Change-Post: -1 %
FVC-%Pred-Post: 80 %
FVC-%Pred-Pre: 81 %
FVC-Post: 2.36 L
FVC-Pre: 2.41 L
Post FEV1/FVC ratio: 81 %
Post FEV6/FVC ratio: 100 %
Pre FEV1/FVC ratio: 85 %
Pre FEV6/FVC Ratio: 100 %
RV % pred: 98 %
RV: 2.41 L
TLC % pred: 91 %
TLC: 4.9 L

## 2023-09-13 MED ORDER — ALBUTEROL SULFATE (2.5 MG/3ML) 0.083% IN NEBU
2.5000 mg | INHALATION_SOLUTION | Freq: Once | RESPIRATORY_TRACT | Status: AC
  Administered 2023-09-13: 2.5 mg via RESPIRATORY_TRACT

## 2023-09-13 NOTE — Progress Notes (Signed)
Patient ID: Rhonda Buchanan, female   DOB: 1946/03/25, 77 y.o.   MRN: [id number]   [facility]  NOTE  Referring Physician: Dr. Berenice Bouton Primary Care: Dr. Malva Limes   HPI: Rhonda M Teters is a 77 y.o. 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 Titus Dubin for screening for [facility].  She presents today for yearly follow up. Feels ok. More tired recently. Not sleeping well. Gets tired during the day. Recently found to have stage I breast cancer. No SOB, edema, syncope.   Sleep study 8/22 negative   Echo today 09/13/23  EF 65-70% RV normal RVSP  PFTS today 09/13/23  FEV1  2.06 (93%) FVC    2.41L (88%) DLCO 66%    Previous studies:  Echo  08/12/22 EF 60-65% G1DD RV normal. Mild TR RVSP 30-33mmHG  PFTS 08/12/22;  FEV1  2.08 (93%) FVC    2.64L (88%) DLCO 72%  Echo 08/15/20 : EF 60-65% RV normal. No evidence of PAH. Aov calcified no significant AS Personally reviewed  PFTs 10/21 FEV1  1.86 (98%) FVC    2.25 L (92%) DLCO 73%   PFTs 2/20 FEV1  2.0L (111%) FVC    2.4L (103% DLCO 70%   PFTs 2017  FEV1 2.07 (109%) FVC  2.54 (104%) DLCO 64%  PFTs 08/12/17-  FEV1 2.03 (109%) FVC  2.43 (101%) DLCO 60%  09/2017 Stress Echo EF 65-70%, Grade 1 DD. No chest pain. + Hypertensive BP response. No stress induced WMAs.   08/12/2017 ECHO 60-65%. No evidence PAH. Mild AS Normal RV   Hi-res CT of chest done 8/17 for abnormal DLCO 1. No evidence of interstitial lung disease. 2. No evidence of achalasia. 3. No acute findings in the thorax. 4. Aortic atherosclerosis, in addition to left main and 3 vessel coronary artery disease. Please note that although the presence of coronary artery calcium documents the presence of coronary artery disease, the severity of this disease and any potential stenosis cannot be assessed on this non-gated CT examination. Assessment for potential risk factor modification, dietary therapy or pharmacologic therapy  may be warranted, if clinically indicated. 5. There are calcifications of the aortic valve. Echocardiographic correlation for evaluation of potential valvular dysfunction may be warranted if clinically indicated.    Past Medical History:  Diagnosis Date   Allergy    Arthritis    Asthma    Hypercholesterolemia    Hypertension    PONV (postoperative nausea and vomiting)    PUD (peptic ulcer disease)    Scleroderma (HCC)     Current Outpatient Medications  Medication Sig Dispense Refill   Brinzolamide-Brimonidine 1-0.2 % SUSP Place 1 drop into both eyes 3 (three) times daily.     folic acid (FOLVITE) 1 MG tablet Take 3 mg by mouth daily. Hold on the day se takes Methotrexate     lisinopril-hydrochlorothiazide (ZESTORETIC) 10-12.5 MG tablet Take 1 tablet by mouth daily. 90 tablet 3   methotrexate (RHEUMATREX) 2.5 MG tablet TAKE 4 TABLETS BY MOUTH ONCE WEEKLY. Caution:Chemotherapy. Protect from light.     metoprolol succinate (TOPROL-XL) 50 MG 24 hr tablet TAKE 1/2 TABLET BY MOUTH DAILY. TAKE WITH OR IMMEDIATELY FOLLOWING A MEAL. 45 tablet 1   multivitamin (THERAGRAN) per tablet Take 1 tablet by mouth daily.     potassium chloride (KLOR-CON) 10 MEQ tablet Take 1 tablet (10 mEq total) by mouth 2 (two) times daily. 180 tablet 0   prednisoLONE acetate (PRED FORTE) 1 % ophthalmic suspension  Place 1 drop into both eyes daily.      rosuvastatin (CRESTOR) 40 MG tablet Take 1 tablet (40 mg total) by mouth daily. 90 tablet 3   timolol (TIMOPTIC) 0.5 % ophthalmic solution Place 1 drop into both eyes every morning.     No current facility-administered medications for this encounter.   Allergies  Allergen Reactions   Aspirin Other (See Comments)   Ibuprofen     GI upset    Social History   Socioeconomic History   Marital status: Divorced    Spouse name: Not on file   Number of children: Not on file   Years of education: Not on file   Highest education level: Not on file  Occupational  History   Not on file  Tobacco Use   Smoking status: Never    Passive exposure: Never   Smokeless tobacco: Never  Vaping Use   Vaping status: Never Used  Substance and Sexual Activity   Alcohol use: No   Drug use: No   Sexual activity: Not Currently  Other Topics Concern   Not on file  Social History Narrative   Not on file   Social Determinants of Health   Financial Resource Strain: Low Risk  (04/04/2023)   Overall Financial Resource Strain (CARDIA)    Difficulty of Paying Living Expenses: Not hard at all  Food Insecurity: No Food Insecurity (04/04/2023)   Hunger Vital Sign    Worried About Running Out of Food in the Last Year: Never true    Ran Out of Food in the Last Year: Never true  Transportation Needs: No Transportation Needs (04/04/2023)   PRAPARE - Administrator, Civil Service (Medical): No    Lack of Transportation (Non-Medical): No  Physical Activity: Insufficiently Active (04/04/2023)   Exercise Vital Sign    Days of Exercise per Week: 3 days    Minutes of Exercise per Session: 20 min  Stress: No Stress Concern Present (04/04/2023)   [employer] of Occupational Health - Occupational Stress Questionnaire    Feeling of Stress : Not at all  Social Connections: Unknown (04/04/2023)   Social Connection and Isolation Panel [NHANES]    Frequency of Communication with Friends and Family: Twice a week    Frequency of Social Gatherings with Friends and Family: Once a week    Attends Religious Services: Not on Insurance claims handler of Clubs or Organizations: No    Attends Banker Meetings: Not on file    Marital Status: Divorced  Intimate Partner Violence: Not At Risk (04/05/2023)   Humiliation, Afraid, Rape, and Kick questionnaire    Fear of Current or Ex-Partner: No    Emotionally Abused: No    Physically Abused: No    Sexually Abused: No      Family History  Problem Relation Age of Onset   Cancer Father    Ulcers Sister    High  Cholesterol Sister    Ulcers Brother    Heart disease Brother 75       MI   Cancer Maternal Grandmother    Heart disease Maternal Grandfather     Vitals:   09/13/23 1001  BP: 100/60  Pulse: (!) 55  SpO2: 99%  Weight: 77.6 kg (171 lb)    Wt Readings from Last 3 Encounters:  09/13/23 77.6 kg (171 lb)  08/22/23 77.2 kg (170 lb 3.2 oz)  04/26/23 76.4 kg (168 lb 6.4 oz)    PHYSICAL EXAM:  General:  Well appearing. No resp difficulty HEENT: normal Neck: supple. no JVD. Carotids 2+ bilat; no bruits. No lymphadenopathy or thryomegaly appreciated. Cor: PMI nondisplaced. Regular rate & rhythm. 2/6 SEM LUSB Lungs: clear Abdomen: soft, nontender, nondistended. No hepatosplenomegaly. No bruits or masses. Good bowel sounds. Extremities: no cyanosis, clubbing, rash, edema Neuro: alert & orientedx3, cranial nerves grossly intact. moves all 4 extremities w/o difficulty. Affect pleasant   ECG: NSR 67 1AVB 206 ms No ST-T wave abnormalities.Personally reviewed   ASSESSMENT & PLAN:  1. Scleroderma - 09/2017 Stress Echo EF 65-70%, Grade 1 DD. No chest pain. + Hypertensive BP response. No stress induced WMAs - Echo 2/20 EF 55-60% Grade II DD. Thickened Aov without overt AS - Echo 08/15/20 : EF 60-65% RV normal. No evidence of PAH. Aov calcified no significant AS  - Echo 08/12/22 EF 60-65% G1DD RV normal. Mild TR RVSP 30-77mmHG - Echo today 09/13/23  EF 65-70% RV normal RVSP Aov calcified no significant AS Personally reviewed - Echo and PFTs stable No role for RHC currently - Continue screening every 1-2 years 2. HTN - Blood pressure well controlled. Continue current regimen. 3. Very mild aortic stenosis - AoV quite calcified but no frank AS.  - No change on echo today 4. Coronary calcifications - By CT scan. - 09/2017 Stress Echo EF 65-70%, Grade 1 DD. No chest pain. + Hypertensive BP response. No stress induced WMAs - No s/s of ischemia    5. Fatigue -  Had home sleep study  which was normal  Arvilla Meres, MD  10:42 AM

## 2023-09-13 NOTE — Patient Instructions (Signed)
Medication Changes:  No Changes In Medications at this time.   Echocardiogram and PFT in 1 year   Follow-Up in: 12 months PLEASE CALL OUR OFFICE AROUND SEPTEMBER 2025 TO GET SCHEDULED FOR YOUR APPOINTMENT. PHONE NUMBER IS [phone] OPTION 2    At the [facility], you and your health needs are our priority. We have a designated team specialized in the treatment of Heart Failure. This Care Team includes your primary Heart Failure Specialized Cardiologist (physician), Advanced Practice Providers (APPs- Physician Assistants and Nurse Practitioners), and Pharmacist who all work together to provide you with the care you need, when you need it.   You may see any of the following providers on your designated Care Team at your next follow up:  Dr. Arvilla Meres Dr. Marca Ancona Dr. Dorthula Nettles Dr. Theresia Bough Tonye Becket, NP Robbie Lis, [facility], Nitisha Brynda Peon, NP [place] Lee, NP Karle Plumber, PharmD   Please be sure to bring in all your medications bottles to every appointment.   Need to Contact us:  If you have any questions or concerns before your next appointment please send [place] a message through [place] or call our office at [phone].    TO LEAVE A MESSAGE FOR THE NURSE SELECT OPTION 2, PLEASE LEAVE A MESSAGE INCLUDING: YOUR NAME DATE OF BIRTH CALL BACK NUMBER REASON FOR CALL**this is important as we prioritize the call backs  YOU WILL RECEIVE A CALL BACK THE SAME DAY AS LONG AS YOU CALL BEFORE 4:00 PM

## 2023-09-14 ENCOUNTER — Encounter: Payer: Self-pay | Admitting: Family Medicine

## 2023-09-14 LAB — ECHOCARDIOGRAM COMPLETE
Area-P 1/2: 1.91 cm2
S' Lateral: 2.8 cm

## 2023-09-19 NOTE — Progress Notes (Signed)
Radiation Oncology         [phone]) [phone] ________________________________  Name: Rhonda Buchanan        MRN: [id number]  Date of Service: 09/21/2023 DOB: 27-Mar-1946  [id number], Everardo All, MD  Emelia Loron, MD     REFERRING PHYSICIAN: Emelia Loron, MD   DIAGNOSIS: {There were no encounter diagnoses. (Refresh or delete this SmartLink)}   HISTORY OF PRESENT ILLNESS: Rhonda M Reh is a 77 y.o. female seen in the multidisciplinary breast clinic for a new diagnosis of *** breast cancer. The patient was noted to have ***.    PREVIOUS RADIATION THERAPY: {EXAM; YES/NO:19492::"No"}   PAST MEDICAL HISTORY:  Past Medical History:  Diagnosis Date   Allergy    Arthritis    Asthma    Hypercholesterolemia    Hypertension    PONV (postoperative nausea and vomiting)    PUD (peptic ulcer disease)    Scleroderma (HCC)        PAST SURGICAL HISTORY: Past Surgical History:  Procedure Laterality Date   arthroscopy     BREAST BIOPSY Right 09/07/2023   Ductal carcinoma in situ, intermediate grade; Calcifications present; 0.1 cm DCIS length.   CYST REMOVAL HAND     EYE SURGERY Left 10/25/2017   laser surgery    KNEE ARTHROPLASTY       FAMILY HISTORY:  Family History  Problem Relation Age of Onset   Cancer Father    Ulcers Sister    High Cholesterol Sister    Ulcers Brother    Heart disease Brother 65       MI   Cancer Maternal Grandmother    Heart disease Maternal Grandfather      SOCIAL HISTORY:  reports that she has never smoked. She has never been exposed to tobacco smoke. She has never used smokeless tobacco. She reports that she does not drink alcohol and does not use drugs.  The patient is divorced and lives in [place].  She***   ALLERGIES: Aspirin and Ibuprofen   MEDICATIONS:  Current Outpatient Medications  Medication Sig Dispense Refill   Brinzolamide-Brimonidine 1-0.2 % SUSP Place 1 drop into both eyes 3 (three) times daily.     folic acid  (FOLVITE) 1 MG tablet Take 3 mg by mouth daily. Hold on the day se takes Methotrexate     lisinopril-hydrochlorothiazide (ZESTORETIC) 10-12.5 MG tablet Take 1 tablet by mouth daily. 90 tablet 3   methotrexate (RHEUMATREX) 2.5 MG tablet TAKE 4 TABLETS BY MOUTH ONCE WEEKLY. Caution:Chemotherapy. Protect from light.     metoprolol succinate (TOPROL-XL) 50 MG 24 hr tablet TAKE 1/2 TABLET BY MOUTH DAILY. TAKE WITH OR IMMEDIATELY FOLLOWING A MEAL. 45 tablet 1   multivitamin (THERAGRAN) per tablet Take 1 tablet by mouth daily.     potassium chloride (KLOR-CON) 10 MEQ tablet Take 1 tablet (10 mEq total) by mouth 2 (two) times daily. 180 tablet 0   prednisoLONE acetate (PRED FORTE) 1 % ophthalmic suspension Place 1 drop into both eyes daily.      rosuvastatin (CRESTOR) 40 MG tablet Take 1 tablet (40 mg total) by mouth daily. 90 tablet 3   timolol (TIMOPTIC) 0.5 % ophthalmic solution Place 1 drop into both eyes every morning.     No current facility-administered medications for this visit.     REVIEW OF SYSTEMS: On review of systems, the patient reports that she is doing ***     PHYSICAL EXAM:  Wt Readings from Last 3 Encounters:  09/13/23 171 lb (77.6  kg)  08/22/23 170 lb 3.2 oz (77.2 kg)  04/26/23 168 lb 6.4 oz (76.4 kg)   Temp Readings from Last 3 Encounters:  04/26/23 98.2 F (36.8 C) (Oral)  03/21/23 98.1 F (36.7 C)  03/16/23 98.2 F (36.8 C) (Oral)   BP Readings from Last 3 Encounters:  09/13/23 100/60  08/22/23 128/79  04/26/23 138/68   Pulse Readings from Last 3 Encounters:  09/13/23 (!) 55  08/22/23 72  04/26/23 65    In general this is a well appearing African American female in no acute distress. She's alert and oriented x4 and appropriate throughout the examination. Cardiopulmonary assessment is negative for acute distress and she exhibits normal effort. Bilateral breast exam is deferred.    ECOG = ***  0 - Asymptomatic (Fully active, able to carry on all  predisease activities without restriction)  1 - Symptomatic but completely ambulatory (Restricted in physically strenuous activity but ambulatory and able to carry out work of a light or sedentary nature. For example, light housework, office work)  2 - Symptomatic, <50% in bed during the day (Ambulatory and capable of all self care but unable to carry out any work activities. Up and about more than 50% of waking hours)  3 - Symptomatic, >50% in bed, but not bedbound (Capable of only limited self-care, confined to bed or chair 50% or more of waking hours)  4 - Bedbound (Completely disabled. Cannot carry on any self-care. Totally confined to bed or chair)  5 - Death   Santiago Glad MM, Creech RH, Tormey DC, et al. [phone]). "Toxicity and response criteria of the [facility]". Am. Evlyn Clines. Oncol. 5 (6): 649-55    LABORATORY DATA:  Lab Results  Component Value Date   WBC 4.6 03/07/2023   HGB 13.0 03/07/2023   HCT 38.4 03/07/2023   MCV 94.1 03/07/2023   PLT 196 03/07/2023   Lab Results  Component Value Date   NA 141 04/26/2023   K 3.7 04/26/2023   CL 105 04/26/2023   CO2 21 04/26/2023   Lab Results  Component Value Date   ALT 26 04/26/2023   AST 28 04/26/2023   ALKPHOS 90 04/26/2023   BILITOT 0.4 04/26/2023      RADIOGRAPHY: ECHOCARDIOGRAM COMPLETE  Result Date: 09/14/2023    ECHOCARDIOGRAM REPORT   Patient Name:   Rhonda M Spargur Date of Exam: 09/13/2023 Medical Rec #:  [id number]         Height:       65.5 in Accession #:    [id number]        Weight:       170.2 lb Date of Birth:  01-Jun-1946         BSA:          1.858 m Patient Age:    77 years          BP:           111/66 mmHg Patient Gender: F                 HR:           62 bpm. Exam Location:  Outpatient Procedure: 2D Echo, Color Doppler and Cardiac Doppler Indications:    I50.22 Chronic systolic (congestive) heart failure  History:        Patient has prior history of Echocardiogram examinations, most                  recent 08/12/2022. CAD, Scleroderma;  Risk Factors:Hypertension                 and Dyslipidemia.  Sonographer:    Irving Burton Senior RDCS Referring Phys: [id number] DANIEL R BENSIMHON IMPRESSIONS  1. Left ventricular ejection fraction, by estimation, is 65 to 70%. The left ventricle has normal function. The left ventricle has no regional wall motion abnormalities. There is mild concentric left ventricular hypertrophy. Left ventricular diastolic parameters are consistent with Grade I diastolic dysfunction (impaired relaxation).  2. Right ventricular systolic function is normal. The right ventricular size is normal. There is normal pulmonary artery systolic pressure. The estimated right ventricular systolic pressure is 20.5 mmHg.  3. The mitral valve is normal in structure. Trivial mitral valve regurgitation. No evidence of mitral stenosis.  4. The aortic valve is abnormal. There is moderate calcification of the aortic valve. Aortic valve regurgitation is not visualized. Aortic valve sclerosis/calcification is present, without any evidence of aortic stenosis.  5. The inferior vena cava is normal in size with greater than 50% respiratory variability, suggesting right atrial pressure of 3 mmHg. FINDINGS  Left Ventricle: Left ventricular ejection fraction, by estimation, is 65 to 70%. The left ventricle has normal function. The left ventricle has no regional wall motion abnormalities. The left ventricular internal cavity size was normal in size. There is  mild concentric left ventricular hypertrophy. Left ventricular diastolic parameters are consistent with Grade I diastolic dysfunction (impaired relaxation). Right Ventricle: The right ventricular size is normal. No increase in right ventricular wall thickness. Right ventricular systolic function is normal. There is normal pulmonary artery systolic pressure. The tricuspid regurgitant velocity is 2.09 m/s, and  with an assumed right atrial pressure of 3 mmHg, the estimated  right ventricular systolic pressure is 20.5 mmHg. Left Atrium: Left atrial size was normal in size. Right Atrium: Right atrial size was normal in size. Pericardium: Trivial pericardial effusion is present. Presence of epicardial fat layer. Mitral Valve: The mitral valve is normal in structure. Trivial mitral valve regurgitation. No evidence of mitral valve stenosis. Tricuspid Valve: The tricuspid valve is normal in structure. Tricuspid valve regurgitation is mild . No evidence of tricuspid stenosis. Aortic Valve: The aortic valve is abnormal. There is moderate calcification of the aortic valve. Aortic valve regurgitation is not visualized. Aortic valve sclerosis/calcification is present, without any evidence of aortic stenosis. Pulmonic Valve: The pulmonic valve was normal in structure. Pulmonic valve regurgitation is trivial. No evidence of pulmonic stenosis. Aorta: The aortic root and ascending aorta are structurally normal, with no evidence of dilitation. Venous: The inferior vena cava is normal in size with greater than 50% respiratory variability, suggesting right atrial pressure of 3 mmHg. IAS/Shunts: No atrial level shunt detected by color flow Doppler.  LEFT VENTRICLE PLAX 2D LVIDd:         3.80 cm   Diastology LVIDs:         2.80 cm   LV e' medial:    5.11 cm/s LV PW:         1.20 cm   LV E/e' medial:  12.2 LV IVS:        1.20 cm   LV e' lateral:   5.87 cm/s LVOT diam:     1.90 cm   LV E/e' lateral: 10.6 LV SV:         70 LV SV Index:   38 LVOT Area:     2.84 cm  RIGHT VENTRICLE RV S prime:     7.62 cm/s TAPSE (M-mode): 1.8  cm LEFT ATRIUM             Index        RIGHT ATRIUM           Index LA diam:        3.00 cm 1.62 cm/m   RA Area:     13.70 cm LA Vol (A2C):   53.7 ml 28.91 ml/m  RA Volume:   30.50 ml  16.42 ml/m LA Vol (A4C):   33.0 ml 17.77 ml/m LA Biplane Vol: 43.4 ml 23.36 ml/m  AORTIC VALVE LVOT Vmax:   106.00 cm/s LVOT Vmean:  78.700 cm/s LVOT VTI:    0.246 m  AORTA Ao Root diam: 2.40 cm Ao  Asc diam:  2.80 cm MITRAL VALVE               TRICUSPID VALVE MV Area (PHT): 1.91 cm    TR Peak grad:   17.5 mmHg MV Decel Time: 397 msec    TR Vmax:        209.00 cm/s MV E velocity: 62.40 cm/s MV A velocity: 73.10 cm/s  SHUNTS MV E/A ratio:  0.85        Systemic VTI:  0.25 m                            Systemic Diam: 1.90 cm Weston Brass MD Electronically signed by Weston Brass MD Signature Date/Time: 09/14/2023/11:46:26 AM    Final        IMPRESSION/PLAN: 1. *** .Dr. Mitzi Hansen discusses the pathology findings and reviews the nature of *** breast disease. The consensus from the breast conference includes breast conservation with lumpectomy with *** sentinel node biopsy. Depending on the size of the final tumor measurements rendered by pathology, the tumor may be tested for Oncotype Dx score to determine a role for systemic therapy. Provided that chemotherapy is not indicated, the patient's course would then be followed by external radiotherapy to the breast  to reduce risks of local recurrence followed by antiestrogen therapy. We discussed the risks, benefits, short, and long term effects of radiotherapy, as well as the curative intent, and the patient is interested in proceeding. Dr. Mitzi Hansen discusses the delivery and logistics of radiotherapy and anticipates a course of *** weeks of radiotherapy. We will see her back a few weeks after surgery to discuss the simulation process and anticipate we starting radiotherapy about 4-6 weeks after surgery.  2. Possible genetic predisposition to malignancy. The patient is a candidate for genetic testing given *** personal and family history. She will meet with our geneticist today in clinic.   In a visit lasting *** minutes, greater than 50% of the time was spent face to face reviewing her case, as well as in preparation of, discussing, and coordinating the patient's care.  The above documentation reflects my direct findings during this shared patient visit.  Please see the separate note by Dr. Mitzi Hansen on this date for the remainder of the patient's plan of care.    Osker Mason, [facility]    **Disclaimer: This note was dictated with voice recognition software. Similar sounding words can inadvertently be transcribed and this note may contain transcription errors which may not have been corrected upon publication of note.**

## 2023-09-20 ENCOUNTER — Encounter: Payer: Self-pay | Admitting: *Deleted

## 2023-09-20 DIAGNOSIS — D0511 Intraductal carcinoma in situ of right breast: Secondary | ICD-10-CM | POA: Insufficient documentation

## 2023-09-21 ENCOUNTER — Ambulatory Visit
Admission: RE | Admit: 2023-09-21 | Discharge: 2023-09-21 | Disposition: A | Discharge status: Home / Self Care | Payer: Medicare Other | Source: Ambulatory Visit | Attending: Radiation Oncology | Admitting: Radiation Oncology

## 2023-09-21 ENCOUNTER — Encounter: Payer: Self-pay | Admitting: Hematology and Oncology

## 2023-09-21 ENCOUNTER — Other Ambulatory Visit: Payer: Self-pay | Admitting: General Surgery

## 2023-09-21 ENCOUNTER — Inpatient Hospital Stay: Payer: Medicare Other | Attending: Hematology and Oncology | Admitting: Hematology and Oncology

## 2023-09-21 ENCOUNTER — Inpatient Hospital Stay: Payer: Medicare Other | Admitting: *Deleted

## 2023-09-21 ENCOUNTER — Inpatient Hospital Stay: Payer: Medicare Other | Admitting: Licensed Clinical Social Worker

## 2023-09-21 ENCOUNTER — Encounter: Payer: Self-pay | Admitting: Genetic Counselor

## 2023-09-21 ENCOUNTER — Inpatient Hospital Stay: Payer: Medicare Other | Admitting: Genetic Counselor

## 2023-09-21 ENCOUNTER — Ambulatory Visit: Payer: Medicare Other | Admitting: Physical Therapy

## 2023-09-21 ENCOUNTER — Telehealth: Payer: Self-pay

## 2023-09-21 VITALS — BP 131/47 | HR 64 | Temp 97.9°F | Resp 16 | Wt 172.9 lb

## 2023-09-21 DIAGNOSIS — Z803 Family history of malignant neoplasm of breast: Secondary | ICD-10-CM | POA: Diagnosis not present

## 2023-09-21 DIAGNOSIS — Z170 Estrogen receptor positive status [ER+]: Secondary | ICD-10-CM | POA: Insufficient documentation

## 2023-09-21 DIAGNOSIS — D0511 Intraductal carcinoma in situ of right breast: Secondary | ICD-10-CM | POA: Diagnosis not present

## 2023-09-21 DIAGNOSIS — M349 Systemic sclerosis, unspecified: Secondary | ICD-10-CM | POA: Insufficient documentation

## 2023-09-21 DIAGNOSIS — E785 Hyperlipidemia, unspecified: Secondary | ICD-10-CM

## 2023-09-21 DIAGNOSIS — Z8042 Family history of malignant neoplasm of prostate: Secondary | ICD-10-CM

## 2023-09-21 DIAGNOSIS — Z853 Personal history of malignant neoplasm of breast: Secondary | ICD-10-CM | POA: Diagnosis not present

## 2023-09-21 LAB — CMP (CANCER CENTER ONLY)
ALT: 21 U/L (ref 0–44)
AST: 24 U/L (ref 15–41)
Albumin: 4.2 g/dL (ref 3.5–5.0)
Alkaline Phosphatase: 70 U/L (ref 38–126)
Anion gap: 6 (ref 5–15)
BUN: 16 mg/dL (ref 8–23)
CO2: 30 mmol/L (ref 22–32)
Calcium: 10.4 mg/dL — ABNORMAL HIGH (ref 8.9–10.3)
Chloride: 104 mmol/L (ref 98–111)
Creatinine: 1.11 mg/dL — ABNORMAL HIGH (ref 0.44–1.00)
GFR, Estimated: 51 mL/min — ABNORMAL LOW (ref >60)
Glucose, Bld: 90 mg/dL (ref 70–99)
Potassium: 3.1 mmol/L — ABNORMAL LOW (ref 3.5–5.1)
Sodium: 140 mmol/L (ref 135–145)
Total Bilirubin: 0.6 mg/dL (ref <1.2)
Total Protein: 7.2 g/dL (ref 6.5–8.1)

## 2023-09-21 LAB — CBC WITH DIFFERENTIAL (CANCER CENTER ONLY)
Abs Immature Granulocytes: 0.01 10*3/uL (ref 0.00–0.07)
Basophils Absolute: 0.1 10*3/uL (ref 0.0–0.1)
Basophils Relative: 1 %
Eosinophils Absolute: 0.2 10*3/uL (ref 0.0–0.5)
Eosinophils Relative: 4 %
HCT: 40 % (ref 36.0–46.0)
Hemoglobin: 13.5 g/dL (ref 12.0–15.0)
Immature Granulocytes: 0 %
Lymphocytes Relative: 31 %
Lymphs Abs: 1.6 10*3/uL (ref 0.7–4.0)
MCH: 32.4 pg (ref 26.0–34.0)
MCHC: 33.8 g/dL (ref 30.0–36.0)
MCV: 95.9 fL (ref 80.0–100.0)
Monocytes Absolute: 0.5 10*3/uL (ref 0.1–1.0)
Monocytes Relative: 10 %
Neutro Abs: 2.7 10*3/uL (ref 1.7–7.7)
Neutrophils Relative %: 54 %
Platelet Count: 164 10*3/uL (ref 150–400)
RBC: 4.17 MIL/uL (ref 3.87–5.11)
RDW: 14.5 % (ref 11.5–15.5)
WBC Count: 5 10*3/uL (ref 4.0–10.5)
nRBC: 0 % (ref 0.0–0.2)

## 2023-09-21 LAB — GENETIC SCREENING ORDER

## 2023-09-21 NOTE — Assessment & Plan Note (Signed)
This is a very pleasant 77 year old female patient with newly diagnosed right breast DCIS ER/PR positive referred to breast [facility] for additional recommendations.  Pathology review: I discussed with the patient the difference between DCIS and invasive breast cancer. It is considered a precancerous lesion. DCIS is classified as a Stage 0 breast cancer. It is generally detected through mammograms as calcifications. We discussed the significance of grades and its impact on prognosis. We also discussed the importance of ER and PR receptors and their implications to adjuvant treatment options. Prognosis of DCIS dependence on grade and degree of comedo necrosis. It is anticipated that if not treated, 20-30% of DCIS can develop into invasive breast cancer.  Recommendation: 1. Breast conserving surgery 2. Followed by adjuvant radiation therapy, in her case we plan to omit it since she has underlying scleroderma 3. Followed by antiestrogen therapy with tamoxifen/aromatase inhibitors based on menopausal status 5 years  Tamoxifen counseling: We discussed the risks and benefits of tamoxifen. These include but not limited to insomnia, hot flashes, mood changes, vaginal dryness, and weight gain. Although rare, serious side effects including endometrial cancer, risk of blood clots were also discussed. We strongly believe that the benefits far outweigh the risks. Patient understands these risks and consented to starting treatment. Planned treatment duration is 5 years.  Aromatase inhibitors counseling: We have discussed the mechanism of action of aromatase inhibitors today.  We have discussed adverse effects including but not limited to menopausal symptoms, increased risk of osteoporosis and fractures, cardiovascular events, arthralgias and myalgias.  We do believe that the benefits far outweigh the risks.  Plan treatment duration of 5 years.  She will return to clinic after surgery to initiate antiestrogen therapy.

## 2023-09-21 NOTE — Progress Notes (Signed)
[facility] Clinical Social Work  Initial Assessment   Rhonda Buchanan is a 77 y.o. year old female accompanied by sister and daughter. Clinical Social Work was referred by  [facility]  for assessment of psychosocial needs.   SDOH (Social Determinants of Health) assessments performed: Yes SDOH Interventions    Flowsheet Row Clinical Support from 09/21/2023 in [facility] Of [place]. [facility] Clinical Support from 04/05/2023 in [facility] Office Visit from 04/22/2022 in [facility] Visit from 12/21/2018 in [facility]  SDOH Interventions      Food Insecurity Interventions Intervention Not Indicated Intervention Not Indicated -- --  Housing Interventions Intervention Not Indicated Intervention Not Indicated -- --  Transportation Interventions Intervention Not Indicated Intervention Not Indicated -- --  Utilities Interventions Intervention Not Indicated Intervention Not Indicated -- --  Alcohol Usage Interventions -- Intervention Not Indicated (Score <7) -- --  Depression Interventions/Treatment  -- -- ACZ6-6 Score <4 Follow-up Not Indicated Counseling  Financial Strain Interventions -- Intervention Not Indicated -- --  Physical Activity Interventions -- Intervention Not Indicated -- --  Stress Interventions -- Intervention Not Indicated -- --  Social Connections Interventions -- Intervention Not Indicated -- --       SDOH Screenings   Food Insecurity: No Food Insecurity (09/21/2023)  Housing: Low Risk  (09/21/2023)  Transportation Needs: No Transportation Needs (09/21/2023)  Utilities: Not At Risk (09/21/2023)  Alcohol Screen: Low Risk  (04/05/2023)  Depression (PHQ2-9): Medium Risk (04/05/2023)  Financial Resource Strain: Low Risk  (04/04/2023)  Physical Activity: Insufficiently Active (04/04/2023)  Social Connections: Unknown (04/04/2023)  Stress: No Stress Concern Present (04/04/2023)  Tobacco Use: Low Risk  (09/21/2023)    Received from [facility]     Distress Screen completed: No     No data to display            Family/Social Information:  Housing Arrangement: patient lives with her sister, Rhonda Buchanan members/support persons in your life? Sister and daughter Rhonda Buchanan- lives in [place]) Transportation concerns: no  Employment: Retired .  Income source: Actor concerns: No Type of concern: None Food access concerns: no Services Currently in place:  [facility] Medicare  Coping/ Adjustment to diagnosis: Patient understands treatment plan and what happens next? Yes. Patient feels better after meeting with medical team today. She tends to process internally and then focus on other things to cope Patient reported stressors: Adjusting to my illness Patient enjoys  playing games on phone, gardening/ flowers Current coping skills/ strengths: Capable of independent living , Motivation for treatment/growth , and Supportive family/friends     SUMMARY: Current SDOH Barriers:  No major barriers noted today  Clinical Social Work Clinical Goal(s):  No clinical social work goals at this time  Interventions: Discussed common feeling and emotions when being diagnosed with cancer, and the importance of support during treatment Informed patient of the support team roles and support services at [facility] Provided CSW contact information and encouraged patient to call with any questions or concerns   Follow Up Plan: Patient will contact CSW with any support or resource needs Patient verbalizes understanding of plan: Yes    Rhonda Asleson E Delorese Sellin, LCSW Clinical Social Worker [facility]

## 2023-09-21 NOTE — Progress Notes (Signed)
REFERRING PROVIDER: Rachel Moulds, MD  PRIMARY PROVIDER:  Ronnald Nian, MD  PRIMARY REASON FOR VISIT:  1. Ductal carcinoma in situ (DCIS) of right breast   2. Family history of breast cancer   3. Family history of prostate cancer    HISTORY OF PRESENT ILLNESS:   Rhonda Buchanan, a 77 y.o. female, was seen for a [place] cancer genetics consultation at the request of Dr. Al Pimple due to a personal and family history of cancer.  Rhonda Buchanan presents to clinic today to discuss the possibility of a hereditary predisposition to cancer, to discuss genetic testing, and to further clarify her future cancer risks, as well as potential cancer risks for family members.   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  CANCER HISTORY:  Oncology History  Ductal carcinoma in situ (DCIS) of right breast  08/04/2023 Mammogram   She had a screening mammogram in October 2024 which showed indeterminate right breast calcs, diagnostic mammogram was recommended, diagnostic mammogram also showed grouped linear pleomorphic calcs in the right breast suspicious of malignancy.  Stereotactic biopsy was recommended.   09/07/2023 Pathology Results   Right breast needle core biopsy at 12:00 middle depth showed intermediate grade DCIS, prognostic showed 90% positive ER strong staining, 40% positive PR weak staining   09/20/2023 Initial Diagnosis   Ductal carcinoma in situ (DCIS) of right breast   09/21/2023 Cancer Staging   Staging form: Breast, AJCC 8th Edition - Clinical stage from 09/21/2023: Stage 0 (cTis (DCIS), cN0, cM0, ER+, PR+) - Signed by Rachel Moulds, MD on 09/21/2023 Stage prefix: Initial diagnosis Nuclear grade: G2     Past Medical History:  Diagnosis Date   Allergy    Arthritis    Asthma    Breast cancer (HCC)    Hypercholesterolemia    Hypertension    PONV (postoperative nausea and vomiting)    PUD (peptic  ulcer disease)    Scleroderma (HCC)     Past Surgical History:  Procedure Laterality Date   arthroscopy     BREAST BIOPSY Right 09/07/2023   Ductal carcinoma in situ, intermediate grade; Calcifications present; 0.1 cm DCIS length.   CYST REMOVAL HAND     EYE SURGERY Left 10/25/2017   laser surgery    KNEE ARTHROPLASTY      Social History   Socioeconomic History   Marital status: Divorced    Spouse name: Not on file   Number of children: Not on file   Years of education: Not on file   Highest education level: Not on file  Occupational History   Not on file  Tobacco Use   Smoking status: Never    Passive exposure: Never   Smokeless tobacco: Never  Vaping Use   Vaping status: Never Used  Substance and Sexual Activity   Alcohol use: No   Drug use: No   Sexual activity: Not Currently  Other Topics Concern   Not on file  Social History Narrative   Not on file   Social Determinants of Health   Financial Resource Strain: Low Risk  (04/04/2023)   Overall Financial Resource Strain (CARDIA)    Difficulty of Paying Living Expenses: Not hard at all  Food Insecurity: No Food Insecurity (09/21/2023)   Hunger Vital Sign    Worried About Running Out of Food in the Last Year: Never true    Ran Out of Food in the Last  Year: Never true  Transportation Needs: No Transportation Needs (09/21/2023)   PRAPARE - Administrator, Civil Service (Medical): No    Lack of Transportation (Non-Medical): No  Physical Activity: Insufficiently Active (04/04/2023)   Exercise Vital Sign    Days of Exercise per Week: 3 days    Minutes of Exercise per Session: 20 min  Stress: No Stress Concern Present (04/04/2023)   [employer] of Occupational Health - Occupational Stress Questionnaire    Feeling of Stress : Not at all  Social Connections: Unknown (04/04/2023)   Social Connection and Isolation Panel [NHANES]    Frequency of Communication with Friends and Family: Twice a week     Frequency of Social Gatherings with Friends and Family: Once a week    Attends Religious Services: Not on Marketing executive or Organizations: No    Attends Engineer, structural: Not on file    Marital Status: Divorced     FAMILY HISTORY:  We obtained a detailed, 4-generation family history.  Significant diagnoses are listed below: Family History  Problem Relation Age of Onset   Cancer Father 32 - 85       unknown type   Ulcers Sister    High Cholesterol Sister    Ulcers Brother    Heart disease Brother 17       MI   Prostate cancer Maternal Uncle    Breast cancer Maternal Grandmother 14 - 69   Heart disease Maternal Grandfather    Cancer Cousin        unknown types, maternal first cousins     Rhonda Buchanan is unaware of previous family history of genetic testing for hereditary cancer risks. There is no reported Ashkenazi Jewish ancestry.  GENETIC COUNSELING ASSESSMENT: Rhonda Buchanan is a 77 y.o. female with a personal and family history of cancer which is somewhat suggestive of a hereditary predisposition to cancer. We, therefore, discussed and recommended the following at today's visit.   DISCUSSION: We discussed that 5 - 10% of cancer is hereditary, with most cases of breast cancer associated with BRCA1/2.  There are other genes that can be associated with hereditary breast cancer syndromes.  We discussed that testing is beneficial for several reasons including knowing how to follow individuals after completing their treatment, identifying whether potential treatment options would be beneficial, and understanding if other family members could be at risk for cancer and allowing them to undergo genetic testing.   We reviewed the characteristics, features and inheritance patterns of hereditary cancer syndromes. We also discussed genetic testing, including the appropriate family members to test, the process of testing, insurance coverage and turn-around-time for  results. We discussed the implications of a negative, positive, carrier and/or variant of uncertain significant result. We recommended Rhonda Buchanan pursue genetic testing for a panel that includes genes associated with breast and prostate cancer.   Rhonda Buchanan elected to have [facility] CancerNext-Expanded Panel. The CancerNext-Expanded gene panel offered by [facility] and includes sequencing, rearrangement, and RNA analysis for the following 76 genes: AIP, ALK, APC, ATM, AXIN2, BAP1, BARD1, BMPR1A, BRCA1, BRCA2, BRIP1, CDC73, CDH1, CDK4, CDKN1B, CDKN2A, CEBPA, CHEK2, CTNNA1, DDX41, DICER1, ETV6, FH, FLCN, GATA2, LZTR1, MAX, MBD4, MEN1, MET, MLH1, MSH2, MSH3, MSH6, MUTYH, NF1, NF2, NTHL1, PALB2, PHOX2B, PMS2, POT1, PRKAR1A, PTCH1, PTEN, RAD51C, RAD51D, RB1, RET, RUNX1, SDHA, SDHAF2, SDHB, SDHC, SDHD, SMAD4, SMARCA4, SMARCB1, SMARCE1, STK11, SUFU, TMEM127, TP53, TSC1, TSC2, VHL, and WT1 (sequencing and deletion/duplication); EGFR, HOXB13,  KIT, MITF, PDGFRA, POLD1, and POLE (sequencing only); EPCAM and GREM1 (deletion/duplication only).    Based on Rhonda Buchanan's personal and family history of cancer, she meets medical criteria for genetic testing. Despite that she meets criteria, she may still have an out of pocket cost. We discussed that if her out of pocket cost for testing is over $100, the laboratory should contact them to discuss self-pay prices, patient pay assistance programs, if applicable, and other billing options.  PLAN: After considering the risks, benefits, and limitations, Rhonda Buchanan provided informed consent to pursue genetic testing and the blood sample was sent to [facility] for analysis of the CancerNext-Expanded Panel. Results should be available within approximately 2-3 weeks' time, at which point they will be disclosed by telephone to Rhonda Buchanan, as will any additional recommendations warranted by these results. Rhonda Buchanan will receive a summary of her genetic counseling visit and a  copy of her results once available. This information will also be available in Epic.   Rhonda Buchanan questions were answered to her satisfaction today. Our contact information was provided should additional questions or concerns arise. Thank you for the referral and allowing [place] to share in the care of your patient.   Rhonda Brothers, MS, [facility] Genetic Counselor [email or web address] (P) [phone]  The patient was seen for a total of 20 minutes in face-to-face genetic counseling.  The patient brought her sister and daughter. Drs. Pamelia Hoit and/or Mosetta Putt were available to discuss this case as needed.   _______________________________________________________________________ For Office Staff:  Number of people involved in session: 3 Was an Intern/ student involved with case: no

## 2023-09-21 NOTE — Telephone Encounter (Signed)
...     Pre-operative Risk Assessment    Patient Name: Rhonda Buchanan  DOB: 05-Apr-1946 MRN: [id number]      Request for Surgical Clearance    Procedure:   LUMPECTOMY SURGERY  Date of Surgery:  Clearance TBD                                 Surgeon:  DR Emelia Loron Surgeon's Group or Practice Name:  [facility] Phone number:  [phone] Fax number:  [phone]   Type of Clearance Requested:   - Medical    Type of Anesthesia:  General    Additional requests/questions:   LAST O/V  09/13/23, NO NEW APPT  Signed, Renee Ramus   09/21/2023, 12:25 PM

## 2023-09-21 NOTE — Telephone Encounter (Signed)
   Name: Rhonda Buchanan  DOB: [birth year]  MRN: [id number]  Primary Cardiologist: Arvilla Meres, MD  Chart reviewed as part of pre-operative protocol coverage. Patient has not been seen in the office since 07/2022. Because of Rhonda M Cumpston's past medical history and time since last visit, she will require a follow-up in-office visit in order to better assess preoperative cardiovascular risk.   Pre-op covering staff: - Please schedule appointment and call patient to inform them. If patient already had an upcoming appointment within acceptable timeframe, please add "pre-op clearance" to the appointment notes so provider is aware. - Please contact requesting surgeon's office via preferred method (i.e, phone, fax) to inform them of need for appointment prior to surgery.   Corrin Parker, PA-C  09/21/2023, 12:57 PM

## 2023-09-21 NOTE — Progress Notes (Signed)
[facility] CONSULT NOTE  Patient Care Team: Ronnald Nian, MD as PCP - General (Family Medicine) Bensimhon, Bevelyn Buckles, MD as PCP - Cardiology (Cardiology) Emelia Loron, MD as Consulting Physician (General Surgery) Rachel Moulds, MD as Consulting Physician (Hematology and Oncology) Dorothy Puffer, MD as Consulting Physician (Radiation Oncology) Pershing Proud, RN as Registered Nurse Donnelly Angelica, RN as Registered Nurse  CHIEF COMPLAINTS/PURPOSE OF CONSULTATION:  Newly diagnosed breast cancer  HISTORY OF PRESENTING ILLNESS:  Rhonda Buchanan 77 y.o. female is here because of recent diagnosis of right breast DCIS  I reviewed her records extensively and collaborated the history with the patient.  SUMMARY OF ONCOLOGIC HISTORY: Oncology History  Ductal carcinoma in situ (DCIS) of right breast  08/04/2023 Mammogram   She had a screening mammogram in October 2024 which showed indeterminate right breast calcs, diagnostic mammogram was recommended, diagnostic mammogram also showed grouped linear pleomorphic calcs in the right breast suspicious of malignancy.  Stereotactic biopsy was recommended.   09/07/2023 Pathology Results   Right breast needle core biopsy at 12:00 middle depth showed intermediate grade DCIS, prognostic showed 90% positive ER strong staining, 40% positive PR weak staining   09/20/2023 Initial Diagnosis   Ductal carcinoma in situ (DCIS) of right breast   09/21/2023 Cancer Staging   Staging form: Breast, AJCC 8th Edition - Clinical stage from 09/21/2023: Stage 0 (cTis (DCIS), cN0, cM0, ER+, PR+) - Signed by Rachel Moulds, MD on 09/21/2023 Stage prefix: Initial diagnosis Nuclear grade: G2    This is a very pleasant 77 year old female patient with newly diagnosed right breast DCIS arrived with her daughter to the breast MDC. She is healthy at baseline, has underlying dyslipidemia and scleroderma.  She has been on methotrexate for several years.   Rest of the pertinent 10 point ROS reviewed and negative  MEDICAL HISTORY:  Past Medical History:  Diagnosis Date   Allergy    Arthritis    Asthma    Breast cancer (HCC)    Hypercholesterolemia    Hypertension    PONV (postoperative nausea and vomiting)    PUD (peptic ulcer disease)    Scleroderma (HCC)     SURGICAL HISTORY: Past Surgical History:  Procedure Laterality Date   arthroscopy     BREAST BIOPSY Right 09/07/2023   Ductal carcinoma in situ, intermediate grade; Calcifications present; 0.1 cm DCIS length.   CYST REMOVAL HAND     EYE SURGERY Left 10/25/2017   laser surgery    KNEE ARTHROPLASTY      SOCIAL HISTORY: Social History   Socioeconomic History   Marital status: Divorced    Spouse name: Not on file   Number of children: Not on file   Years of education: Not on file   Highest education level: Not on file  Occupational History   Not on file  Tobacco Use   Smoking status: Never    Passive exposure: Never   Smokeless tobacco: Never  Vaping Use   Vaping status: Never Used  Substance and Sexual Activity   Alcohol use: No   Drug use: No   Sexual activity: Not Currently  Other Topics Concern   Not on file  Social History Narrative   Not on file   Social Determinants of Health   Financial Resource Strain: Low Risk  (04/04/2023)   Overall Financial Resource Strain (CARDIA)    Difficulty of Paying Living Expenses: Not hard at all  Food Insecurity: No Food Insecurity (04/04/2023)   Hunger  Vital Sign    Worried About Programme researcher, broadcasting/film/video in the Last Year: Never true    Ran Out of Food in the Last Year: Never true  Transportation Needs: No Transportation Needs (04/04/2023)   PRAPARE - Administrator, Civil Service (Medical): No    Lack of Transportation (Non-Medical): No  Physical Activity: Insufficiently Active (04/04/2023)   Exercise Vital Sign    Days of Exercise per Week: 3 days    Minutes of Exercise per Session: 20 min  Stress: No  Stress Concern Present (04/04/2023)   [employer] of Occupational Health - Occupational Stress Questionnaire    Feeling of Stress : Not at all  Social Connections: Unknown (04/04/2023)   Social Connection and Isolation Panel [NHANES]    Frequency of Communication with Friends and Family: Twice a week    Frequency of Social Gatherings with Friends and Family: Once a week    Attends Religious Services: Not on Insurance claims handler of Clubs or Organizations: No    Attends Banker Meetings: Not on file    Marital Status: Divorced  Intimate Partner Violence: Not At Risk (04/05/2023)   Humiliation, Afraid, Rape, and Kick questionnaire    Fear of Current or Ex-Partner: No    Emotionally Abused: No    Physically Abused: No    Sexually Abused: No    FAMILY HISTORY: Family History  Problem Relation Age of Onset   Cancer Father    Ulcers Sister    High Cholesterol Sister    Ulcers Brother    Heart disease Brother 40       MI   Cancer Maternal Grandmother    Heart disease Maternal Grandfather     ALLERGIES:  is allergic to aspirin and ibuprofen.  MEDICATIONS:  Current Outpatient Medications  Medication Sig Dispense Refill   folic acid (FOLVITE) 1 MG tablet Take 3 mg by mouth daily. Hold on the day se takes Methotrexate     lisinopril-hydrochlorothiazide (ZESTORETIC) 10-12.5 MG tablet Take 1 tablet by mouth daily. 90 tablet 3   methotrexate (RHEUMATREX) 2.5 MG tablet TAKE 4 TABLETS BY MOUTH ONCE WEEKLY. Caution:Chemotherapy. Protect from light.     metoprolol succinate (TOPROL-XL) 50 MG 24 hr tablet TAKE 1/2 TABLET BY MOUTH DAILY. TAKE WITH OR IMMEDIATELY FOLLOWING A MEAL. 45 tablet 1   potassium chloride (KLOR-CON) 10 MEQ tablet Take 1 tablet (10 mEq total) by mouth 2 (two) times daily. 180 tablet 0   rosuvastatin (CRESTOR) 40 MG tablet Take 1 tablet (40 mg total) by mouth daily. 90 tablet 3   Brinzolamide-Brimonidine 1-0.2 % SUSP Place 1 drop into both eyes 3 (three)  times daily.     multivitamin (THERAGRAN) per tablet Take 1 tablet by mouth daily.     prednisoLONE acetate (PRED FORTE) 1 % ophthalmic suspension Place 1 drop into both eyes daily.      timolol (TIMOPTIC) 0.5 % ophthalmic solution Place 1 drop into both eyes every morning.     No current facility-administered medications for this visit.    REVIEW OF SYSTEMS:   Constitutional: Denies fevers, chills or abnormal night sweats Eyes: Denies blurriness of vision, double vision or watery eyes Ears, nose, mouth, throat, and face: Denies mucositis or sore throat Respiratory: Denies cough, dyspnea or wheezes Cardiovascular: Denies palpitation, chest discomfort or lower extremity swelling Gastrointestinal:  Denies nausea, heartburn or change in bowel habits Skin: Denies abnormal skin rashes Lymphatics: Denies new lymphadenopathy or easy bruising  Neurological:Denies numbness, tingling or new weaknesses Behavioral/Psych: Mood is stable, no new changes  Breast: Denies any palpable lumps or discharge All other systems were reviewed with the patient and are negative.  PHYSICAL EXAMINATION: ECOG PERFORMANCE STATUS: 0 - Asymptomatic  Vitals:   09/21/23 0841  BP: (!) 131/47  Pulse: 64  Resp: 16  Temp: 97.9 F (36.6 C)  SpO2: 100%   Filed Weights   09/21/23 0841  Weight: 172 lb 14.4 oz (78.4 kg)    GENERAL:alert, no distress and comfortable Rest of the physical exam deferred in lieu of counseling  LABORATORY DATA:  I have reviewed the data as listed Lab Results  Component Value Date   WBC 4.6 03/07/2023   HGB 13.0 03/07/2023   HCT 38.4 03/07/2023   MCV 94.1 03/07/2023   PLT 196 03/07/2023   Lab Results  Component Value Date   NA 141 04/26/2023   K 3.7 04/26/2023   CL 105 04/26/2023   CO2 21 04/26/2023    RADIOGRAPHIC STUDIES: I have personally reviewed the radiological reports and agreed with the findings in the report.  ASSESSMENT AND PLAN:  Ductal carcinoma in situ  (DCIS) of right breast This is a very pleasant 77 year old female patient with newly diagnosed right breast DCIS ER/PR positive referred to breast MDC for additional recommendations.  Pathology review: I discussed with the patient the difference between DCIS and invasive breast cancer. It is considered a precancerous lesion. DCIS is classified as a Stage 0 breast cancer. It is generally detected through mammograms as calcifications. We discussed the significance of grades and its impact on prognosis. We also discussed the importance of ER and PR receptors and their implications to adjuvant treatment options. Prognosis of DCIS dependence on grade and degree of comedo necrosis. It is anticipated that if not treated, 20-30% of DCIS can develop into invasive breast cancer.  Recommendation: 1. Breast conserving surgery 2. Followed by adjuvant radiation therapy, in her case we plan to omit it since she has underlying scleroderma 3. Followed by antiestrogen therapy with tamoxifen/aromatase inhibitors based on menopausal status 5 years  Tamoxifen counseling: We discussed the risks and benefits of tamoxifen. These include but not limited to insomnia, hot flashes, mood changes, vaginal dryness, and weight gain. Although rare, serious side effects including endometrial cancer, risk of blood clots were also discussed. We strongly believe that the benefits far outweigh the risks. Patient understands these risks and consented to starting treatment. Planned treatment duration is 5 years.  Aromatase inhibitors counseling: We have discussed the mechanism of action of aromatase inhibitors today.  We have discussed adverse effects including but not limited to menopausal symptoms, increased risk of osteoporosis and fractures, cardiovascular events, arthralgias and myalgias.  We do believe that the benefits far outweigh the risks.  Plan treatment duration of 5 years.  She will return to clinic after surgery to initiate  antiestrogen therapy.   All questions were answered. The patient knows to call the clinic with any problems, questions or concerns.    Rachel Moulds, MD 09/21/23

## 2023-09-21 NOTE — Telephone Encounter (Signed)
I will send a message to [facility] scheduling team to schedule an appt with Dr. Gala Romney or APP in the [facility] for pre op clearance.

## 2023-09-24 ENCOUNTER — Other Ambulatory Visit: Payer: Self-pay | Admitting: Family Medicine

## 2023-09-24 DIAGNOSIS — I10 Essential (primary) hypertension: Secondary | ICD-10-CM

## 2023-09-26 NOTE — Telephone Encounter (Signed)
Will update the preop APP per Ethel Rana RN for Dr. Gala Romney pt was just seen by MD 09/13/23.

## 2023-09-26 NOTE — Telephone Encounter (Signed)
Dr. Gala Romney,   You saw this patient on 09/13/2023. Per office protocol, will you please comment on medical clearance for lumpectomy?  Please route your response to P CV DIV Preop. I will communicate with requesting office once you have given recommendations.   Thank you!  Carlos Levering, NP

## 2023-09-26 NOTE — Telephone Encounter (Signed)
I will follow up with the [facility] scheduling team to inquire about appt needed. I do not see that pt has been scheduled an appt for pre op clearance. See notes.

## 2023-09-27 ENCOUNTER — Telehealth: Payer: Self-pay | Admitting: *Deleted

## 2023-09-27 ENCOUNTER — Encounter: Payer: Self-pay | Admitting: *Deleted

## 2023-09-27 NOTE — Telephone Encounter (Signed)
Spoke to pt concerning [facility] from 09/21/23. Denies questions or concerns regarding dx or treatment care plan. Encourage pt to call with needs. Received verbal understanding.

## 2023-09-30 NOTE — Telephone Encounter (Signed)
   Patient Name: Rhonda Buchanan  DOB: [birth year] MRN: [id number]  Primary Cardiologist: Arvilla Meres, MD  Chart reviewed as part of pre-operative protocol coverage. Given past medical history and time since last visit, based on ACC/AHA guidelines, Rhonda Buchanan is at acceptable risk for the planned procedure without further cardiovascular testing.   The patient was advised that if she develops new symptoms prior to surgery to contact our office to arrange for a follow-up visit, and she verbalized understanding. Patient is not on any anticoagulation or antiplatelet medication at this time that should be held.   I will route this recommendation to the requesting party via Epic fax function and remove from pre-op pool.  Please call with questions.  Joni Reining, NP 09/30/2023, 10:58 AM

## 2023-10-03 ENCOUNTER — Encounter: Payer: Self-pay | Admitting: *Deleted

## 2023-10-04 ENCOUNTER — Telehealth: Payer: Self-pay | Admitting: Hematology and Oncology

## 2023-10-04 NOTE — Telephone Encounter (Signed)
Left patient a message in regarding to scheduled appointment times/dates; left callback if needed for rescheduling

## 2023-10-07 DIAGNOSIS — Z961 Presence of intraocular lens: Secondary | ICD-10-CM | POA: Diagnosis not present

## 2023-10-07 DIAGNOSIS — H04129 Dry eye syndrome of unspecified lacrimal gland: Secondary | ICD-10-CM | POA: Diagnosis not present

## 2023-10-07 DIAGNOSIS — H209 Unspecified iridocyclitis: Secondary | ICD-10-CM | POA: Diagnosis not present

## 2023-10-07 DIAGNOSIS — H43823 Vitreomacular adhesion, bilateral: Secondary | ICD-10-CM | POA: Diagnosis not present

## 2023-10-10 ENCOUNTER — Telehealth: Payer: Self-pay | Admitting: Genetic Counselor

## 2023-10-10 ENCOUNTER — Encounter: Payer: Self-pay | Admitting: Genetic Counselor

## 2023-10-10 ENCOUNTER — Ambulatory Visit: Payer: Self-pay | Admitting: Genetic Counselor

## 2023-10-10 DIAGNOSIS — Z1379 Encounter for other screening for genetic and chromosomal anomalies: Secondary | ICD-10-CM | POA: Insufficient documentation

## 2023-10-10 NOTE — Telephone Encounter (Signed)
I contacted Rhonda Buchanan to discuss her genetic testing results. No pathogenic variants were identified in the 76 genes analyzed. Detailed clinic note to follow.  The test report has been scanned into EPIC and is located under the Molecular Pathology section of the Results Review tab.  A portion of the result report is included below for reference.   Lalla Brothers, MS, [facility] Genetic Counselor [email or web address] (P) [phone]

## 2023-10-10 NOTE — Progress Notes (Signed)
HPI:   Rhonda Buchanan was previously seen in the [facility] due to a personal and family history of cancer and concerns regarding a hereditary predisposition to cancer. Please refer to our prior cancer genetics clinic note for more information regarding our discussion, assessment and recommendations, at the time. Rhonda Buchanan recent genetic test results were disclosed to her, as were recommendations warranted by these results. These results and recommendations are discussed in more detail below.  CANCER HISTORY:  Oncology History  Ductal carcinoma in situ (DCIS) of right breast  08/04/2023 Mammogram   She had a screening mammogram in October 2024 which showed indeterminate right breast calcs, diagnostic mammogram was recommended, diagnostic mammogram also showed grouped linear pleomorphic calcs in the right breast suspicious of malignancy.  Stereotactic biopsy was recommended.   09/07/2023 Pathology Results   Right breast needle core biopsy at 12:00 middle depth showed intermediate grade DCIS, prognostic showed 90% positive ER strong staining, 40% positive PR weak staining   09/20/2023 Initial Diagnosis   Ductal carcinoma in situ (DCIS) of right breast   09/21/2023 Cancer Staging   Staging form: Breast, AJCC 8th Edition - Clinical stage from 09/21/2023: Stage 0 (cTis (DCIS), cN0, cM0, ER+, PR+) - Signed by Rachel Moulds, MD on 09/21/2023 Stage prefix: Initial diagnosis Nuclear grade: G2    Genetic Testing   [facility] CancerNext-Expanded Panel+RNA was Negative. Report date is 10/10/2023.   The CancerNext-Expanded gene panel offered by [facility] and includes sequencing, rearrangement, and RNA analysis for the following 76 genes: AIP, ALK, APC, ATM, AXIN2, BAP1, BARD1, BMPR1A, BRCA1, BRCA2, BRIP1, CDC73, CDH1, CDK4, CDKN1B, CDKN2A, CEBPA, CHEK2, CTNNA1, DDX41, DICER1, ETV6, FH, FLCN, GATA2, LZTR1, MAX, MBD4, MEN1, MET, MLH1, MSH2, MSH3, MSH6, MUTYH, NF1, NF2, NTHL1, PALB2,  PHOX2B, PMS2, POT1, PRKAR1A, PTCH1, PTEN, RAD51C, RAD51D, RB1, RET, RUNX1, SDHA, SDHAF2, SDHB, SDHC, SDHD, SMAD4, SMARCA4, SMARCB1, SMARCE1, STK11, SUFU, TMEM127, TP53, TSC1, TSC2, VHL, and WT1 (sequencing and deletion/duplication); EGFR, HOXB13, KIT, MITF, PDGFRA, POLD1, and POLE (sequencing only); EPCAM and GREM1 (deletion/duplication only).       FAMILY HISTORY:  We obtained a detailed, 4-generation family history.  Significant diagnoses are listed below:      Family History  Problem Relation Age of Onset   Cancer Father 36 - 14        unknown type   Ulcers Sister     High Cholesterol Sister     Ulcers Brother     Heart disease Brother 65        MI   Prostate cancer Maternal Uncle     Breast cancer Maternal Grandmother 45 - 69   Heart disease Maternal Grandfather     Cancer Cousin          unknown types, maternal first cousins           Rhonda Buchanan is unaware of previous family history of genetic testing for hereditary cancer risks. There is no reported Ashkenazi Jewish ancestry.  GENETIC TEST RESULTS:  The [facility] CancerNext-Expanded Panel found no pathogenic mutations.   The CancerNext-Expanded gene panel offered by [facility] and includes sequencing, rearrangement, and RNA analysis for the following 76 genes: AIP, ALK, APC, ATM, AXIN2, BAP1, BARD1, BMPR1A, BRCA1, BRCA2, BRIP1, CDC73, CDH1, CDK4, CDKN1B, CDKN2A, CEBPA, CHEK2, CTNNA1, DDX41, DICER1, ETV6, FH, FLCN, GATA2, LZTR1, MAX, MBD4, MEN1, MET, MLH1, MSH2, MSH3, MSH6, MUTYH, NF1, NF2, NTHL1, PALB2, PHOX2B, PMS2, POT1, PRKAR1A, PTCH1, PTEN, RAD51C, RAD51D, RB1, RET, RUNX1, SDHA, SDHAF2, SDHB, SDHC, SDHD,  SMAD4, SMARCA4, SMARCB1, SMARCE1, STK11, SUFU, TMEM127, TP53, TSC1, TSC2, VHL, and WT1 (sequencing and deletion/duplication); EGFR, HOXB13, KIT, MITF, PDGFRA, POLD1, and POLE (sequencing only); EPCAM and GREM1 (deletion/duplication only).     The test report has been scanned into EPIC and is located under the Molecular  Pathology section of the Results Review tab.  A portion of the result report is included below for reference. Genetic testing reported out on 10/10/2023.        Even though a pathogenic variant was not identified, possible explanations for the cancer in the family may include: There may be no hereditary risk for cancer in the family. The cancers in Rhonda Buchanan and/or her family may be due to other genetic or environmental factors. There may be a gene mutation in one of these genes that current testing methods cannot detect, but that chance is small. There could be another gene that has not yet been discovered, or that we have not yet tested, that is responsible for the cancer diagnoses in the family.  It is also possible there is a hereditary cause for the cancer in the family that Rhonda Buchanan did not inherit.  Therefore, it is important to remain in touch with cancer genetics in the future so that we can continue to offer Ms. Langer the most up to date genetic testing.     ADDITIONAL GENETIC TESTING:  We discussed with Rhonda Buchanan that her genetic testing was fairly extensive. If there are genes identified to increase cancer risk that can be analyzed in the future, we would be happy to discuss and coordinate this testing at that time.     CANCER SCREENING RECOMMENDATIONS:  Rhonda Buchanan test result is considered negative (normal).  This means that we have not identified a hereditary cause for her personal and family history of cancer at this time.   An individual's cancer risk and medical management are not determined by genetic test results alone. Overall cancer risk assessment incorporates additional factors, including personal medical history, family history, and any available genetic information that may result in a personalized plan for cancer prevention and surveillance. Therefore, it is recommended she continue to follow the cancer management and screening guidelines provided by her  oncology and primary healthcare provider.  RECOMMENDATIONS FOR FAMILY MEMBERS:   Since she did not inherit a mutation in a cancer predisposition gene included on this panel, her daughter could not have inherited a mutation from her in one of these genes. Individuals in this family might be at some increased risk of developing cancer, over the general population risk, due to the family history of cancer. We recommend women in this family have a yearly mammogram beginning at age 47, or 76 years younger than the earliest onset of cancer, an annual clinical breast exam, and perform monthly breast self-exams.  FOLLOW-UP:  Cancer genetics is a rapidly advancing field and it is possible that new genetic tests will be appropriate for her and/or her family members in the future. We encouraged her to remain in contact with cancer genetics on an annual basis so we can update her personal and family histories and let her know of advances in cancer genetics that may benefit this family.   Our contact number was provided. Ms. Beals questions were answered to her satisfaction, and she knows she is welcome to call us at anytime with additional questions or concerns.   Lalla Brothers, MS, [facility] Genetic Counselor [email or web address] (P) [phone]

## 2023-10-14 NOTE — Progress Notes (Signed)
Surgical Instructions   Your procedure is scheduled on October 21, 2023. Report to [facility] Main Entrance "A" at 5:30 A.M., then check in with the Admitting office. Any questions or running late day of surgery: call [phone]  Questions prior to your surgery date: call [phone], Monday-Friday, 8am-4pm. If you experience any cold or flu symptoms such as cough, fever, chills, shortness of breath, etc. between now and your scheduled surgery, please notify us at the above number.     Remember:  Do not eat after midnight the night before your surgery   You may drink clear liquids until 4:30 the morning of your surgery.   Clear liquids allowed are: Water, Non-Citrus Juices (without pulp), Carbonated Beverages, Clear Tea (no milk, honey, etc.), Black Coffee Only (NO MILK, CREAM OR POWDERED CREAMER of any kind), and Gatorade.  Patient Instructions  The night before surgery:  No food after midnight. ONLY clear liquids after midnight  The day of surgery (if you do NOT have diabetes):  Drink ONE (1) Pre-Surgery Clear Ensure by 4:30 the morning of surgery. Drink in one sitting. Do not sip.  This drink was given to you during your hospital  pre-op appointment visit.  Nothing else to drink after completing the  Pre-Surgery Clear Ensure.         If you have questions, please contact your surgeon's office.    Take these medicines the morning of surgery with A SIP OF WATER  Brinzolamide-Brimonidine eye drops  metoprolol succinate (TOPROL-XL)  rosuvastatin (CRESTOR)  timolol (TIMOPTIC)    May take these medicines IF NEEDED:   One week prior to surgery, STOP taking any Aspirin (unless otherwise instructed by your surgeon) Aleve, Naproxen, Ibuprofen, Motrin, Advil, Goody's, BC's, all herbal medications, fish oil, and non-prescription vitamins.                     Do NOT Smoke (Tobacco/Vaping) for 24 hours prior to your procedure.  If you use a CPAP at night, you may bring your  mask/headgear for your overnight stay.   You will be asked to remove any contacts, glasses, piercing's, hearing aid's, dentures/partials prior to surgery. Please bring cases for these items if needed.    Patients discharged the day of surgery will not be allowed to drive home, and someone needs to stay with them for 24 hours.  SURGICAL WAITING ROOM VISITATION Patients may have no more than 2 support people in the waiting area - these visitors may rotate.   Pre-op nurse will coordinate an appropriate time for 1 ADULT support person, who may not rotate, to accompany patient in pre-op.  Children under the age of 85 must have an adult with them who is not the patient and must remain in the main waiting area with an adult.  If the patient needs to stay at the hospital during part of their recovery, the visitor guidelines for inpatient rooms apply.  Please refer to the [facility] website for the visitor guidelines for any additional information.   If you received a COVID test during your pre-op visit  it is requested that you wear a mask when out in public, stay away from anyone that may not be feeling well and notify your surgeon if you develop symptoms. If you have been in contact with anyone that has tested positive in the last 10 days please notify you surgeon.      Pre-operative CHG Bathing Instructions   You can play a key role  in reducing the risk of infection after surgery. Your skin needs to be as free of germs as possible. You can reduce the number of germs on your skin by washing with CHG (chlorhexidine gluconate) soap before surgery. CHG is an antiseptic soap that kills germs and continues to kill germs even after washing.   DO NOT use if you have an allergy to chlorhexidine/CHG or antibacterial soaps. If your skin becomes reddened or irritated, stop using the CHG and notify one of our RNs at [phone].              TAKE A SHOWER THE NIGHT BEFORE SURGERY AND THE DAY OF SURGERY     Please keep in mind the following:  DO NOT shave, including legs and underarms, 48 hours prior to surgery.   You may shave your face before/day of surgery.  Place clean sheets on your bed the night before surgery Use a clean washcloth (not used since being washed) for each shower. DO NOT sleep with pet's night before surgery.  CHG Shower Instructions:  Wash your face and private area with normal soap. If you choose to wash your hair, wash first with your normal shampoo.  After you use shampoo/soap, rinse your hair and body thoroughly to remove shampoo/soap residue.  Turn the water OFF and apply half the bottle of CHG soap to a CLEAN washcloth.  Apply CHG soap ONLY FROM YOUR NECK DOWN TO YOUR TOES (washing for 3-5 minutes)  DO NOT use CHG soap on face, private areas, open wounds, or sores.  Pay special attention to the area where your surgery is being performed.  If you are having back surgery, having someone wash your back for you may be helpful. Wait 2 minutes after CHG soap is applied, then you may rinse off the CHG soap.  Pat dry with a clean towel  Put on clean pajamas    Additional instructions for the day of surgery: DO NOT APPLY any lotions, deodorants, cologne, or perfumes.   Do not wear jewelry or makeup Do not wear nail polish, gel polish, artificial nails, or any other type of covering on natural nails (fingers and toes) Do not bring valuables to the hospital. [facility] is not responsible for valuables/personal belongings. Put on clean/comfortable clothes.  Please brush your teeth.  Ask your nurse before applying any prescription medications to the skin.

## 2023-10-17 ENCOUNTER — Other Ambulatory Visit: Payer: Self-pay

## 2023-10-17 ENCOUNTER — Encounter (HOSPITAL_COMMUNITY)
Admission: RE | Admit: 2023-10-17 | Discharge: 2023-10-17 | Disposition: A | Discharge status: Home / Self Care | Payer: Medicare Other | Source: Ambulatory Visit | Attending: General Surgery | Admitting: General Surgery

## 2023-10-17 ENCOUNTER — Encounter (HOSPITAL_COMMUNITY): Payer: Self-pay

## 2023-10-17 VITALS — BP 140/57 | HR 70 | Temp 98.5°F | Resp 17 | Ht 66.0 in | Wt 175.8 lb

## 2023-10-17 DIAGNOSIS — I739 Peripheral vascular disease, unspecified: Secondary | ICD-10-CM | POA: Insufficient documentation

## 2023-10-17 DIAGNOSIS — Z01812 Encounter for preprocedural laboratory examination: Secondary | ICD-10-CM | POA: Insufficient documentation

## 2023-10-17 DIAGNOSIS — Z01818 Encounter for other preprocedural examination: Secondary | ICD-10-CM

## 2023-10-17 DIAGNOSIS — L940 Localized scleroderma [morphea]: Secondary | ICD-10-CM | POA: Diagnosis not present

## 2023-10-17 NOTE — Progress Notes (Addendum)
PCP - Carollee Herter, MD   Cardiologist -  Arvilla Meres, MD   PPM/ICD - denies Device Orders -  Rep Notified -   Chest x-ray -  EKG - 09-13-23 Stress Test - 10-03-17 ECHO - 09-13-23 Cardiac Cath -   Sleep Study - Home sleep test 06-11-21 CPAP -   DM - denies  Blood Thinner Instructions: denies Aspirin Instructions:n/a  ERAS Protcol - Clear liquids until 4:30 PRE-SURGERY Ensure  COVID TEST-  na   Anesthesia review : Yes, HTN, Asthma, cardiac clearance and morphea scleroderma  Patient denies shortness of breath, fever, cough and chest pain at PAT appointment   All instructions explained to the patient, with a verbal understanding of the material. Patient agrees to go over the instructions while at home for a better understanding. Patient also instructed to self quarantine after being tested for COVID-19. The opportunity to ask questions was provided.

## 2023-10-18 NOTE — Progress Notes (Signed)
Lab personnel reports they did not receive blood vials on 10-17-23 order to redo lab draw DOS

## 2023-10-19 NOTE — H&P (Signed)
 65 yof who presents after screening that shows b density breasts. She has central calcifications that measure 1.8 cm. 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 She has a fh in mg[place]   Review of Systems: A complete review of systems was obtained from the patient. I have reviewed this information and discussed as appropriate with the patient. See HPI as well for other ROS.  Review of Systems  Musculoskeletal: Positive for joint pain.  All other systems reviewed and are negative.  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   Allergies  Allergen Reactions  Aspirin  Other (See Comments)  GI Upset  Ibuprofen Other (See Comments)  GI upset   Current Outpatient Medications on File Prior to Visit  Medication Sig Dispense Refill  folic acid  (FOLVITE ) 1 MG tablet Take 3 mg by mouth once daily  lisinopriL -hydroCHLOROthiazide  (ZESTORETIC ) 10-12.5 mg tablet Take 1 tablet by mouth once daily  methotrexate (RHEUMATREX) 2.5 MG tablet Take 2.5 mg by mouth every 7 (seven) days Take 6 Tablets on one day each week  potassium chloride  (KLOR-CON ) 10 MEQ ER tablet Take 10 mEq by mouth 2 (two) times daily  rosuvastatin  (CRESTOR ) 40 MG tablet Take 40 mg by mouth once daily  metoprolol  SUCCinate (TOPROL -XL) 25 MG XL tablet Take 1 tablet by mouth once daily   No family history on file.   Social History   Tobacco Use  Smoking Status Not on file  Smokeless Tobacco Not on file   Social History   Socioeconomic History  Marital status: Single   Objective:    Physical Exam Vitals reviewed.  Constitutional:  Appearance: Normal  appearance.  Chest:  Breasts: Right: No inverted nipple, mass or nipple discharge.  Left: No inverted nipple, mass or nipple discharge.  Lymphadenopathy:  Upper Body:  Right upper body: No supraclavicular or axillary adenopathy.  Left upper body: No supraclavicular or axillary adenopathy.  Neurological:  Mental Status: She is alert.    Assessment and Plan:   Right breast DCIS  We discussed the staging and pathophysiology of breast cancer. We discussed all of the different options for treatment for breast cancer including surgery,radiation therapy, and antiestrogen therapy.  She does not need a sentinel node biopsy.  We discussed the options for treatment of the breast cancer which included lumpectomy versus a mastectomy. We discussed the performance of the lumpectomy with radioactive seed placement. We discussed a 5-10% chance of a positive margin requiring reexcision in the operating room. We also discussed that she will likely need radiation therapy if she undergoes lumpectomy. We discussed mastectomy and the postoperative care for that as well. Mastectomy can be followed by reconstruction. The decision for lumpectomy vs mastectomy has no impact on decision for chemotherapy. Most mastectomy patients will not need radiation therapy. We discussed that there is no difference in her survival whether she undergoes lumpectomy with radiation therapy or antiestrogen therapy versus a mastectomy. There is also no real difference between her recurrence in the breast.  We discussed the risks of operation including bleeding, infection, possible reoperation. She understands her further therapy will be based on what her stages at the time of  her operation.  Will plan to stop mtx 2 weeks prior and start right after that. At higher risk for wound issues. She has stopped for this period before and done fine. Will likely leave some nylon sutures externally

## 2023-10-20 DIAGNOSIS — D0511 Intraductal carcinoma in situ of right breast: Secondary | ICD-10-CM | POA: Diagnosis not present

## 2023-10-20 NOTE — Anesthesia Preprocedure Evaluation (Addendum)
 Anesthesia Evaluation  Patient identified by MRN, date of birth, ID band Patient awake    Reviewed: Allergy & Precautions, NPO status , Patient's Chart, lab work & pertinent test results  History of Anesthesia Complications (+) PONV and history of anesthetic complications  Airway Mallampati: II  TM Distance: >3 FB Neck ROM: Full    Dental  (+) Teeth Intact, Dental Advisory Given,    Pulmonary    breath sounds clear to auscultation       Cardiovascular hypertension, Pt. on medications (-) angina + CAD and + Peripheral Vascular Disease  (-) Past MI, (-) Cardiac Stents, (-) CABG and (-) CHF  Rhythm:Regular  1. Left ventricular ejection fraction, by estimation, is 65 to 70%. The  left ventricle has normal function. The left ventricle has no regional  wall motion abnormalities. There is mild concentric left ventricular  hypertrophy. Left ventricular diastolic  parameters are consistent with Grade I diastolic dysfunction (impaired  relaxation).   2. Right ventricular systolic function is normal. The right ventricular  size is normal. There is normal pulmonary artery systolic pressure. The  estimated right ventricular systolic pressure is 20.5 mmHg.   3. The mitral valve is normal in structure. Trivial mitral valve  regurgitation. No evidence of mitral stenosis.   4. The aortic valve is abnormal. There is moderate calcification of the  aortic valve. Aortic valve regurgitation is not visualized. Aortic valve  sclerosis/calcification is present, without any evidence of aortic  stenosis.   5. The inferior vena cava is normal in size with greater than 50%  respiratory variability, suggesting right atrial pressure of 3 mmHg.     Neuro/Psych  Headaches, neg Seizures  negative psych ROS   GI/Hepatic Neg liver ROS, PUD,,,  Endo/Other    Renal/GU Renal InsufficiencyRenal diseaseLab Results      Component                Value                Date                      NA                       140                 09/21/2023                K                        3.1 (L)             09/21/2023                CO2                      30                  09/21/2023                GLUCOSE                  90                  09/21/2023                BUN  16                  09/21/2023                CREATININE               1.11 (H)            09/21/2023                CALCIUM                   10.4 (H)            09/21/2023                EGFR                     45 (L)              04/26/2023                GFRNONAA                 51 (L)              09/21/2023                Musculoskeletal  (+) Arthritis ,    Abdominal   Peds  Hematology negative hematology ROS (+)   Anesthesia Other Findings   Reproductive/Obstetrics                              Anesthesia Physical Anesthesia Plan  ASA: 3  Anesthesia Plan: General   Post-op Pain Management: Tylenol  PO (pre-op)*   Induction: Intravenous  PONV Risk Score and Plan: 4 or greater and Ondansetron , Dexamethasone , Propofol  infusion and TIVA  Airway Management Planned: LMA  Additional Equipment: None  Intra-op Plan:   Post-operative Plan: Extubation in OR  Informed Consent: I have reviewed the patients History and Physical, chart, labs and discussed the procedure including the risks, benefits and alternatives for the proposed anesthesia with the patient or authorized representative who has indicated his/her understanding and acceptance.     Dental advisory given  Plan Discussed with: CRNA  Anesthesia Plan Comments: (PAT note by Lynwood Hope, PA-C: 78 year old female follows with cardiology for history of hypertension and morphea scleroderma.  Echo 09/13/2023 showed EF 65 to 70%, normal RV function, significant aortic valve calcification without AS.  PFTs 09/13/2023 showed FEV1 93%, FVC 88%, DLCO 66%.  Stress  echo 09/2017 showed hypertensive BP response, no stress-induced WMA's.  Last seen by Dr. Cherrie on 09/13/2023.  Echo and PFTs stable.  Recommended continue screening for PAH every 1 to 2 years.  Other pertinent history includes postoperative nausea and vomiting, asthma, peptic ulcer disease.  CMP and CBC from 09/21/2023 reviewed, mild hypokalemia with potassium 3.1, otherwise unremarkable.  Blood work was drawn at preadmission testing, however it was evidently never processed in the lab.  Labs to be drawn day of surgery.  EKG 09/13/2023: Sinus bradycardia with first-degree AV block.  Rate 55.  Minimal voltage criteria for LVH, may be normal variant.  Anteroseptal infarct, age-indeterminate.  TTE 09/13/2023: 1. Left ventricular ejection fraction, by estimation, is 65 to 70%. The  left ventricle has normal function. The left ventricle has no regional  wall motion abnormalities. There is mild concentric left ventricular  hypertrophy. Left ventricular diastolic  parameters are consistent with  Grade I diastolic dysfunction (impaired  relaxation).   2. Right ventricular systolic function is normal. The right ventricular  size is normal. There is normal pulmonary artery systolic pressure. The  estimated right ventricular systolic pressure is 20.5 mmHg.   3. The mitral valve is normal in structure. Trivial mitral valve  regurgitation. No evidence of mitral stenosis.   4. The aortic valve is abnormal. There is moderate calcification of the  aortic valve. Aortic valve regurgitation is not visualized. Aortic valve  sclerosis/calcification is present, without any evidence of aortic  stenosis.   5. The inferior vena cava is normal in size with greater than 50%  respiratory variability, suggesting right atrial pressure of 3 mmHg.   )         Anesthesia Quick Evaluation

## 2023-10-21 ENCOUNTER — Other Ambulatory Visit: Payer: Self-pay

## 2023-10-21 ENCOUNTER — Ambulatory Visit (HOSPITAL_COMMUNITY): Payer: Medicare Other | Admitting: Anesthesiology

## 2023-10-21 ENCOUNTER — Encounter (HOSPITAL_COMMUNITY)
Admission: RE | Disposition: A | Discharge status: Home / Self Care | Payer: Self-pay | Source: Home / Self Care | Attending: General Surgery

## 2023-10-21 ENCOUNTER — Ambulatory Visit (HOSPITAL_COMMUNITY)
Admission: RE | Admit: 2023-10-21 | Discharge: 2023-10-21 | Disposition: A | Discharge status: Home / Self Care | Payer: Medicare Other | Attending: General Surgery | Admitting: General Surgery

## 2023-10-21 ENCOUNTER — Encounter (HOSPITAL_COMMUNITY): Payer: Self-pay | Admitting: General Surgery

## 2023-10-21 ENCOUNTER — Ambulatory Visit (HOSPITAL_COMMUNITY): Payer: Medicare Other | Admitting: Physician Assistant

## 2023-10-21 DIAGNOSIS — Z01818 Encounter for other preprocedural examination: Secondary | ICD-10-CM

## 2023-10-21 DIAGNOSIS — J45909 Unspecified asthma, uncomplicated: Secondary | ICD-10-CM | POA: Diagnosis not present

## 2023-10-21 DIAGNOSIS — I10 Essential (primary) hypertension: Secondary | ICD-10-CM | POA: Insufficient documentation

## 2023-10-21 DIAGNOSIS — Z170 Estrogen receptor positive status [ER+]: Secondary | ICD-10-CM | POA: Diagnosis not present

## 2023-10-21 DIAGNOSIS — I700 Atherosclerosis of aorta: Secondary | ICD-10-CM | POA: Insufficient documentation

## 2023-10-21 DIAGNOSIS — Z1721 Progesterone receptor positive status: Secondary | ICD-10-CM | POA: Diagnosis not present

## 2023-10-21 DIAGNOSIS — D0511 Intraductal carcinoma in situ of right breast: Secondary | ICD-10-CM | POA: Insufficient documentation

## 2023-10-21 DIAGNOSIS — I110 Hypertensive heart disease with heart failure: Secondary | ICD-10-CM | POA: Diagnosis not present

## 2023-10-21 DIAGNOSIS — I509 Heart failure, unspecified: Secondary | ICD-10-CM | POA: Diagnosis not present

## 2023-10-21 DIAGNOSIS — R921 Mammographic calcification found on diagnostic imaging of breast: Secondary | ICD-10-CM | POA: Diagnosis not present

## 2023-10-21 DIAGNOSIS — I2510 Atherosclerotic heart disease of native coronary artery without angina pectoris: Secondary | ICD-10-CM | POA: Insufficient documentation

## 2023-10-21 DIAGNOSIS — C50911 Malignant neoplasm of unspecified site of right female breast: Secondary | ICD-10-CM | POA: Diagnosis not present

## 2023-10-21 HISTORY — PX: BREAST LUMPECTOMY WITH RADIOACTIVE SEED LOCALIZATION: SHX6424

## 2023-10-21 SURGERY — BREAST LUMPECTOMY WITH RADIOACTIVE SEED LOCALIZATION
Anesthesia: General | Site: Breast | Laterality: Right

## 2023-10-21 MED ORDER — DEXAMETHASONE SODIUM PHOSPHATE 10 MG/ML IJ SOLN
INTRAMUSCULAR | Status: DC | PRN
  Administered 2023-10-21: 10 mg via INTRAVENOUS

## 2023-10-21 MED ORDER — CEFAZOLIN SODIUM-DEXTROSE 2-4 GM/100ML-% IV SOLN
2.0000 g | INTRAVENOUS | Status: AC
  Administered 2023-10-21: 2 g via INTRAVENOUS
  Filled 2023-10-21: qty 100

## 2023-10-21 MED ORDER — ACETAMINOPHEN 500 MG PO TABS
1000.0000 mg | ORAL_TABLET | ORAL | Status: AC
  Administered 2023-10-21: 1000 mg via ORAL
  Filled 2023-10-21: qty 2

## 2023-10-21 MED ORDER — PHENYLEPHRINE HCL-NACL 20-0.9 MG/250ML-% IV SOLN
INTRAVENOUS | Status: AC
  Filled 2023-10-21: qty 250

## 2023-10-21 MED ORDER — LIDOCAINE 2% (20 MG/ML) 5 ML SYRINGE
INTRAMUSCULAR | Status: AC
  Filled 2023-10-21: qty 5

## 2023-10-21 MED ORDER — SODIUM CHLORIDE 0.9% FLUSH: 3.0000 mL | Freq: Two times a day (BID) | INTRAVENOUS | Status: DC

## 2023-10-21 MED ORDER — FENTANYL CITRATE (PF) 100 MCG/2ML IJ SOLN: 25.0000 ug | INTRAMUSCULAR | Status: DC | PRN

## 2023-10-21 MED ORDER — ACETAMINOPHEN 650 MG RE SUPP: 650.0000 mg | RECTAL | Status: DC | PRN

## 2023-10-21 MED ORDER — SODIUM CHLORIDE 0.9 % IV SOLN: 250.0000 mL | INTRAVENOUS | Status: DC | PRN

## 2023-10-21 MED ORDER — ENSURE PRE-SURGERY PO LIQD: 296.0000 mL | Freq: Once | ORAL | Status: DC

## 2023-10-21 MED ORDER — CHLORHEXIDINE GLUCONATE 0.12 % MT SOLN
15.0000 mL | Freq: Once | OROMUCOSAL | Status: AC
  Administered 2023-10-21: 15 mL via OROMUCOSAL
  Filled 2023-10-21: qty 15

## 2023-10-21 MED ORDER — SODIUM CHLORIDE 0.9% FLUSH: 3.0000 mL | INTRAVENOUS | Status: DC | PRN

## 2023-10-21 MED ORDER — BUPIVACAINE-EPINEPHRINE 0.25% -1:200000 IJ SOLN
INTRAMUSCULAR | Status: DC | PRN
  Administered 2023-10-21: 10 mL

## 2023-10-21 MED ORDER — ORAL CARE MOUTH RINSE: 15.0000 mL | Freq: Once | OROMUCOSAL | Status: AC

## 2023-10-21 MED ORDER — PROPOFOL 10 MG/ML IV BOLUS
INTRAVENOUS | Status: DC | PRN
  Administered 2023-10-21: 70 mg via INTRAVENOUS

## 2023-10-21 MED ORDER — FENTANYL CITRATE (PF) 250 MCG/5ML IJ SOLN
INTRAMUSCULAR | Status: DC | PRN
  Administered 2023-10-21 (×2): 25 ug via INTRAVENOUS

## 2023-10-21 MED ORDER — FENTANYL CITRATE (PF) 250 MCG/5ML IJ SOLN
INTRAMUSCULAR | Status: AC
  Filled 2023-10-21: qty 5

## 2023-10-21 MED ORDER — 0.9 % SODIUM CHLORIDE (POUR BTL) OPTIME
TOPICAL | Status: DC | PRN
  Administered 2023-10-21: 1000 mL

## 2023-10-21 MED ORDER — ACETAMINOPHEN 10 MG/ML IV SOLN: 1000.0000 mg | Freq: Once | INTRAVENOUS | Status: DC | PRN

## 2023-10-21 MED ORDER — LACTATED RINGERS IV SOLN: INTRAVENOUS | Status: DC | PRN

## 2023-10-21 MED ORDER — EPHEDRINE SULFATE (PRESSORS) 50 MG/ML IJ SOLN
INTRAMUSCULAR | Status: DC | PRN
  Administered 2023-10-21: 15 mg via INTRAVENOUS
  Administered 2023-10-21: 10 mg via INTRAVENOUS

## 2023-10-21 MED ORDER — CHLORHEXIDINE GLUCONATE CLOTH 2 % EX PADS: 6.0000 | MEDICATED_PAD | Freq: Once | CUTANEOUS | Status: DC

## 2023-10-21 MED ORDER — PROPOFOL 500 MG/50ML IV EMUL
INTRAVENOUS | Status: DC | PRN
  Administered 2023-10-21: 200 ug/kg/min via INTRAVENOUS

## 2023-10-21 MED ORDER — ACETAMINOPHEN 160 MG/5ML PO SOLN: 1000.0000 mg | Freq: Once | ORAL | Status: DC | PRN

## 2023-10-21 MED ORDER — BUPIVACAINE-EPINEPHRINE (PF) 0.25% -1:200000 IJ SOLN
INTRAMUSCULAR | Status: AC
  Filled 2023-10-21: qty 30

## 2023-10-21 MED ORDER — ACETAMINOPHEN 325 MG PO TABS: 650.0000 mg | ORAL_TABLET | ORAL | Status: DC | PRN

## 2023-10-21 MED ORDER — DEXAMETHASONE SODIUM PHOSPHATE 10 MG/ML IJ SOLN
INTRAMUSCULAR | Status: AC
  Filled 2023-10-21: qty 1

## 2023-10-21 MED ORDER — PROPOFOL 1000 MG/100ML IV EMUL
INTRAVENOUS | Status: AC
  Filled 2023-10-21: qty 200

## 2023-10-21 MED ORDER — ACETAMINOPHEN 500 MG PO TABS: 1000.0000 mg | ORAL_TABLET | Freq: Once | ORAL | Status: DC | PRN

## 2023-10-21 MED ORDER — ONDANSETRON HCL 4 MG/2ML IJ SOLN
INTRAMUSCULAR | Status: DC | PRN
  Administered 2023-10-21: 4 mg via INTRAVENOUS

## 2023-10-21 MED ORDER — PROPOFOL 10 MG/ML IV BOLUS
INTRAVENOUS | Status: AC
  Filled 2023-10-21: qty 20

## 2023-10-21 MED ORDER — ONDANSETRON HCL 4 MG/2ML IJ SOLN
INTRAMUSCULAR | Status: AC
  Filled 2023-10-21: qty 2

## 2023-10-21 SURGICAL SUPPLY — 36 items
APPLIER CLIP 9.375 MED OPEN (MISCELLANEOUS)
BAG COUNTER SPONGE SURGICOUNT (BAG) ×1 IMPLANT
BINDER BREAST LRG (GAUZE/BANDAGES/DRESSINGS) IMPLANT
BINDER BREAST XLRG (GAUZE/BANDAGES/DRESSINGS) IMPLANT
CANISTER SUCT 3000ML PPV (MISCELLANEOUS) ×1 IMPLANT
CHLORAPREP W/TINT 26 (MISCELLANEOUS) ×1 IMPLANT
CLIP APPLIE 9.375 MED OPEN (MISCELLANEOUS) IMPLANT
CLIP TI MEDIUM 6 (CLIP) ×1 IMPLANT
COVER PROBE W GEL 5X96 (DRAPES) ×1 IMPLANT
COVER SURGICAL LIGHT HANDLE (MISCELLANEOUS) ×1 IMPLANT
DERMABOND ADVANCED .7 DNX12 (GAUZE/BANDAGES/DRESSINGS) ×1 IMPLANT
DEVICE DUBIN SPECIMEN MAMMOGRA (MISCELLANEOUS) ×1 IMPLANT
DRAPE CHEST BREAST 15X10 FENES (DRAPES) ×1 IMPLANT
ELECT COATED BLADE 2.86 ST (ELECTRODE) ×1 IMPLANT
ELECT REM PT RETURN 9FT ADLT (ELECTROSURGICAL) ×1
ELECTRODE REM PT RTRN 9FT ADLT (ELECTROSURGICAL) ×1 IMPLANT
GAUZE PAD ABD 8X10 STRL (GAUZE/BANDAGES/DRESSINGS) IMPLANT
GLOVE BIO SURGEON STRL SZ7 (GLOVE) ×2 IMPLANT
GLOVE BIOGEL PI IND STRL 7.5 (GLOVE) ×1 IMPLANT
GOWN STRL REUS W/ TWL LRG LVL3 (GOWN DISPOSABLE) ×2 IMPLANT
KIT BASIN OR (CUSTOM PROCEDURE TRAY) ×1 IMPLANT
KIT MARKER MARGIN INK (KITS) ×1 IMPLANT
LIGHT WAVEGUIDE WIDE FLAT (MISCELLANEOUS) IMPLANT
NDL HYPO 25GX1X1/2 BEV (NEEDLE) ×1 IMPLANT
NEEDLE HYPO 25GX1X1/2 BEV (NEEDLE) ×1
NS IRRIG 1000ML POUR BTL (IV SOLUTION) ×1 IMPLANT
PACK GENERAL/GYN (CUSTOM PROCEDURE TRAY) ×1 IMPLANT
STRIP CLOSURE SKIN 1/2X4 (GAUZE/BANDAGES/DRESSINGS) ×1 IMPLANT
SUT MNCRL AB 4-0 PS2 18 (SUTURE) ×1 IMPLANT
SUT MON AB 5-0 PS2 18 (SUTURE) IMPLANT
SUT SILK 2 0 SH (SUTURE) IMPLANT
SUT VIC AB 2-0 SH 27XBRD (SUTURE) ×1 IMPLANT
SUT VIC AB 3-0 SH 27X BRD (SUTURE) ×1 IMPLANT
SYR CONTROL 10ML LL (SYRINGE) ×1 IMPLANT
TOWEL GREEN STERILE (TOWEL DISPOSABLE) ×1 IMPLANT
TOWEL GREEN STERILE FF (TOWEL DISPOSABLE) ×1 IMPLANT

## 2023-10-21 NOTE — Op Note (Signed)
 Preoperative diagnosis: Clinical stage 0 right breast cancer Postoperative diagnosis: Same as above Procedure: Right breast radioactive seed guided lumpectomy Surgeon: Dr. Adina Bury Estimated blood loss: Minimal Complications: Drains: None Specimens: 1.  Right breast tissue containing seed and clip marked with paint 2.  Additional inferior and posterior margins marked short stitch superior, long stitch lateral, double stitch deep Sponge needle count was correct completion Disposition recovery stable condition  Indications:77 yof who presents after screening that shows b density breasts. She has central calcifications that measure 1.8 cm. 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  We discussed proceeding with a lumpectomy.  Procedure: After informed consent was obtained she was taken to the operating room.  She had a seed placed prior and I had these mammograms in the operating room.  She was given antibiotics.  SCDs were in place.  She was placed under general anesthesia without complication.  She was prepped and draped in a standard sterile surgical fashion.  Surgical timeout was then performed.  I located the seed in the fairly central breast.  I then infiltrated Marcaine .  I made a periareolar incision in order to hide the scar later.  I then dissected to the seed and remove the seed in the surrounding tissue and attempt to get a clear margin with neoprobe guidance.  I then confirmed removal of the seed and the clip with mammography.  I did remove the 2 additional margins as they appear to be close.  The posterior margin is now the muscle.  I then obtained hemostasis.  I closed the breast tissue with 2-0 Vicryl.  The skin was closed with 3-0 Vicryl and 5-0 Monocryl.  Glue and Steri-Strips were applied.  She tolerated this well was extubated transferred recovery stable.

## 2023-10-21 NOTE — Discharge Instructions (Signed)
 [facility] Office Phone Number [phone]  POST OP INSTRUCTIONS Take 400 mg of ibuprofen every 8 hours or 650 mg tylenol every 6 hours for next 72 hours then as needed. Use ice several times daily also.  A prescription for pain medication may be given to you upon discharge.  Take your pain medication as prescribed, if needed.  If narcotic pain medicine is not needed, then you may take acetaminophen (Tylenol), naprosyn (Alleve) or ibuprofen (Advil) as needed. Take your usually prescribed medications unless otherwise directed If you need a refill on your pain medication, please contact your pharmacy.  They will contact our office to request authorization.  Prescriptions will not be filled after 5pm or on week-ends. You should eat very light the first 24 hours after surgery, such as soup, crackers, pudding, etc.  Resume your normal diet the day after surgery. Most patients will experience some swelling and bruising in the breast.  Ice packs and a good support bra will help.  Wear the breast binder provided or a sports bra for 72 hours day and night.  After that wear a sports bra during the day until you return to the office. Swelling and bruising can take several days to resolve.  It is common to experience some constipation if taking pain medication after surgery.  Increasing fluid intake and taking a stool softener will usually help or prevent this problem from occurring.  A mild laxative (Milk of Magnesia or Miralax) should be taken according to package directions if there are no bowel movements after 48 hours. I used skin glue on the incision, you may shower in 24 hours.  The glue will flake off over the next 2-3 weeks.  Any sutures or staples will be removed at the office during your follow-up visit. ACTIVITIES:  You may resume regular daily activities (gradually increasing) beginning the next day.  Wearing a good support bra or sports bra minimizes pain and swelling.  You may have  sexual intercourse when it is comfortable. You may drive when you no longer are taking prescription pain medication, you can comfortably wear a seatbelt, and you can safely maneuver your car and apply brakes. RETURN TO WORK:  ______________________________________________________________________________________ Rhonda Buchanan should see your doctor in the office for a follow-up appointment approximately two weeks after your surgery.  Your doctor's nurse will typically make your follow-up appointment when she calls you with your pathology report.  Expect your pathology report 3-4 business days after your surgery.  You may call to check if you do not hear from [place] after three days. OTHER INSTRUCTIONS: _______________________________________________________________________________________________ _____________________________________________________________________________________________________________________________________ _____________________________________________________________________________________________________________________________________ _____________________________________________________________________________________________________________________________________  WHEN TO CALL DR Alaija Ruble: Fever over 101.0 Nausea and/or vomiting. Extreme swelling or bruising. Continued bleeding from incision. Increased pain, redness, or drainage from the incision.  The clinic staff is available to answer your questions during regular business hours.  Please don't hesitate to call and ask to speak to one of the nurses for clinical concerns.  If you have a medical emergency, go to the nearest emergency room or call 911.  A surgeon from [facility] is always on call at the hospital.  For further questions, please visit [email or web address]

## 2023-10-21 NOTE — Interval H&P Note (Signed)
 History and Physical Interval Note:  10/21/2023 7:02 AM  Rhonda  M Buchanan  has presented today for surgery, with the diagnosis of RIGHT BREAST DUCTAL CARCINOMA IN SITU.  The various methods of treatment have been discussed with the patient and family. After consideration of risks, benefits and other options for treatment, the patient has consented to  Procedure(s): RIGHT BREAST SEED GUIDED LUMPECTOMY (Right) as a surgical intervention.  The patient's history has been reviewed, patient examined, no change in status, stable for surgery.  I have reviewed the patient's chart and labs.  Questions were answered to the patient's satisfaction.     [name]

## 2023-10-21 NOTE — Transfer of Care (Signed)
 Immediate Anesthesia Transfer of Care Note  Patient: Rhonda Buchanan  Procedure(s) Performed: RIGHT BREAST SEED GUIDED LUMPECTOMY (Right: Breast)  Patient Location: PACU  Anesthesia Type:General  Level of Consciousness: awake, alert , and oriented  Airway & Oxygen Therapy: Patient Spontanous Breathing and Patient connected to nasal cannula oxygen  Post-op Assessment: Report given to RN and Post -op Vital signs reviewed and stable  Post vital signs: Reviewed and stable  Last Vitals:  Vitals Value Taken Time  BP 111/48 10/21/23 0825  Temp 98   Pulse 62 10/21/23 0827  Resp 15 10/21/23 0827  SpO2 100 % 10/21/23 0827  Vitals shown include unfiled device data.  Pain 0/10    Complications: No notable events documented.

## 2023-10-23 ENCOUNTER — Encounter (HOSPITAL_COMMUNITY): Payer: Self-pay | Admitting: General Surgery

## 2023-10-24 ENCOUNTER — Other Ambulatory Visit: Payer: Self-pay

## 2023-10-24 DIAGNOSIS — I739 Peripheral vascular disease, unspecified: Secondary | ICD-10-CM

## 2023-10-25 LAB — SURGICAL PATHOLOGY

## 2023-10-25 NOTE — Anesthesia Postprocedure Evaluation (Signed)
 Anesthesia Post Note  Patient: Rhonda Buchanan  Procedure(s) Performed: RIGHT BREAST SEED GUIDED LUMPECTOMY (Right: Breast)     Patient location during evaluation: PACU Anesthesia Type: General Level of consciousness: awake and alert Pain management: pain level controlled Vital Signs Assessment: post-procedure vital signs reviewed and stable Respiratory status: spontaneous breathing, nonlabored ventilation and respiratory function stable Cardiovascular status: blood pressure returned to baseline and stable Postop Assessment: no apparent nausea or vomiting Anesthetic complications: no   There were no known notable events for this encounter.  Last Vitals:  Vitals:   10/21/23 0845 10/21/23 0900  BP: (!) 120/57 (!) 121/55  Pulse: (!) 58 (!) 55  Resp: 12 12  Temp:  (!) 36.1 C  SpO2: 100% 100%    Last Pain:  Vitals:   10/21/23 0900  TempSrc:   PainSc: 0-No pain                 Donis Kotowski

## 2023-10-28 ENCOUNTER — Encounter: Payer: Self-pay | Admitting: *Deleted

## 2023-11-03 ENCOUNTER — Ambulatory Visit (HOSPITAL_COMMUNITY)
Admission: RE | Admit: 2023-11-03 | Discharge: 2023-11-03 | Disposition: A | Discharge status: Home / Self Care | Payer: Medicare Other | Source: Ambulatory Visit | Attending: Vascular Surgery | Admitting: Vascular Surgery

## 2023-11-03 ENCOUNTER — Ambulatory Visit: Payer: Medicare Other | Admitting: Physician Assistant

## 2023-11-03 VITALS — BP 152/77 | HR 58 | Temp 97.9°F | Resp 18 | Ht 66.0 in | Wt 171.5 lb

## 2023-11-03 DIAGNOSIS — I739 Peripheral vascular disease, unspecified: Secondary | ICD-10-CM | POA: Diagnosis not present

## 2023-11-03 LAB — VAS US ABI WITH/WO TBI
Left ABI: 1.01
Right ABI: 1.01

## 2023-11-03 NOTE — Progress Notes (Signed)
HISTORY AND PHYSICAL     CC:  follow up. Requesting Provider:  Ronnald Nian, MD  HPI: This is a 78 y.o. female who is here today for follow up for PAD.  She was first seen by our office in 2022 with incidental findings of severe aortic atherosclerosis.  She had a history of nocturnal abdominal cramping with abdominal bruit, concerning for possible aneurysm.  No aneurysm was found on imaging, but aortic atherosclerosis was found.    Pt was last seen 10/22/2022 and at that time, she was not having any claudication, rest pain or tissue loss.  She did have some pain that extended from her knees to ankles and it was felt this was neuropathic pain.  She had never had any post prandial pain or weight loss.   The pt returns today for follow up.  She denies any claudication, rest pain or non healing wounds.  She states she gets an occasional pain just below the popliteal fossa on the left leg.  This usually occurs when she is standing.  She states she was scheduled for knee replacement on the left last year, but a series of events delayed this.  She did have breast surgery a couple of weeks ago for early stage breast cancer.  She states she did the genetic testing and this was negative.   The pt is on a statin for cholesterol management.    The pt is not on an aspirin.    Other AC:  none The pt is on BB, ACEI, diuretic for hypertension.  The pt is not on medication for diabetes. Tobacco hx:  never   Past Medical History:  Diagnosis Date   Allergy    Arthritis    Asthma    Breast cancer (HCC)    Hypercholesterolemia    Hypertension    PONV (postoperative nausea and vomiting)    PUD (peptic ulcer disease)    Scleroderma (HCC)     Past Surgical History:  Procedure Laterality Date   arthroscopy     BREAST BIOPSY Right 09/07/2023   Ductal carcinoma in situ, intermediate grade; Calcifications present; 0.1 cm DCIS length.   BREAST LUMPECTOMY WITH RADIOACTIVE SEED LOCALIZATION Right 10/21/2023    Procedure: RIGHT BREAST SEED GUIDED LUMPECTOMY;  Surgeon: Emelia Loron, MD;  Location: [facility] OR;  Service: General;  Laterality: Right;   CYST REMOVAL HAND     EYE SURGERY Left 10/25/2017   laser surgery    KNEE ARTHROPLASTY      Allergies  Allergen Reactions   Aspirin Nausea Only   Ibuprofen Nausea Only    GI upset   Oxycodone Other (See Comments)    Gi- upset    Current Outpatient Medications  Medication Sig Dispense Refill   Brinzolamide-Brimonidine 1-0.2 % SUSP Place 1 drop into both eyes 3 (three) times daily.     folic acid (FOLVITE) 1 MG tablet Take 3 mg by mouth daily. Hold on the day se takes Methotrexate     lisinopril-hydrochlorothiazide (ZESTORETIC) 10-12.5 MG tablet Take 1 tablet by mouth daily. 90 tablet 3   methotrexate (RHEUMATREX) 2.5 MG tablet Take 15 mg by mouth once a week.  Caution:Chemotherapy. Protect from light.     metoprolol succinate (TOPROL-XL) 50 MG 24 hr tablet TAKE 1/2 TABLET BY MOUTH DAILY. TAKE WITH OR IMMEDIATELY FOLLOWING A MEAL. 45 tablet 1   multivitamin (THERAGRAN) per tablet Take 1 tablet by mouth daily.     potassium chloride (KLOR-CON) 10 MEQ tablet TAKE 1  TABLET BY MOUTH 2 TIMES DAILY. 180 tablet 1   prednisoLONE acetate (PRED FORTE) 1 % ophthalmic suspension Place 1 drop into both eyes daily.      rosuvastatin (CRESTOR) 40 MG tablet Take 1 tablet (40 mg total) by mouth daily. 90 tablet 3   timolol (TIMOPTIC) 0.5 % ophthalmic solution Place 1 drop into both eyes every morning.     No current facility-administered medications for this visit.    Family History  Problem Relation Age of Onset   Cancer Father 60 - 51       unknown type   Ulcers Sister    High Cholesterol Sister    Ulcers Brother    Heart disease Brother 34       MI   Prostate cancer Maternal Uncle    Breast cancer Maternal Grandmother 68 - 69   Heart disease Maternal Grandfather    Cancer Cousin        unknown types, maternal first cousins    Social History    Socioeconomic History   Marital status: Divorced    Spouse name: Not on file   Number of children: Not on file   Years of education: Not on file   Highest education level: Not on file  Occupational History   Not on file  Tobacco Use   Smoking status: Never    Passive exposure: Never   Smokeless tobacco: Never  Vaping Use   Vaping status: Never Used  Substance and Sexual Activity   Alcohol use: No   Drug use: No   Sexual activity: Not Currently  Other Topics Concern   Not on file  Social History Narrative   Not on file   Social Drivers of Health   Financial Resource Strain: Low Risk  (04/04/2023)   Overall Financial Resource Strain (CARDIA)    Difficulty of Paying Living Expenses: Not hard at all  Food Insecurity: No Food Insecurity (09/21/2023)   Hunger Vital Sign    Worried About Running Out of Food in the Last Year: Never true    Ran Out of Food in the Last Year: Never true  Transportation Needs: No Transportation Needs (09/21/2023)   PRAPARE - Administrator, Civil Service (Medical): No    Lack of Transportation (Non-Medical): No  Physical Activity: Insufficiently Active (04/04/2023)   Exercise Vital Sign    Days of Exercise per Week: 3 days    Minutes of Exercise per Session: 20 min  Stress: No Stress Concern Present (04/04/2023)   [employer] of Occupational Health - Occupational Stress Questionnaire    Feeling of Stress : Not at all  Social Connections: Unknown (04/04/2023)   Social Connection and Isolation Panel [NHANES]    Frequency of Communication with Friends and Family: Twice a week    Frequency of Social Gatherings with Friends and Family: Once a week    Attends Religious Services: Not on Marketing executive or Organizations: No    Attends Banker Meetings: Not on file    Marital Status: Divorced  Intimate Partner Violence: Patient Declined (09/21/2023)   Humiliation, Afraid, Rape, and Kick questionnaire    Fear  of Current or Ex-Partner: Patient declined    Emotionally Abused: Patient declined    Physically Abused: Patient declined    Sexually Abused: Patient declined     REVIEW OF SYSTEMS:   [X]  denotes positive finding, [ ]  denotes negative finding Cardiac  Comments:  Chest pain or  chest pressure:    Shortness of breath upon exertion:    Short of breath when lying flat:    Irregular heart rhythm:        Vascular    Pain in calf, thigh, or hip brought on by ambulation:    Pain in feet at night that wakes you up from your sleep:     Blood clot in your veins:    Leg swelling:         Pulmonary    Oxygen at home:    Productive cough:     Wheezing:         Neurologic    Sudden weakness in arms or legs:     Sudden numbness in arms or legs:     Sudden onset of difficulty speaking or slurred speech:    Temporary loss of vision in one eye:     Problems with dizziness:         Gastrointestinal    Blood in stool:     Vomited blood:         Genitourinary    Burning when urinating:     Blood in urine:        Psychiatric    Major depression:         Hematologic    Bleeding problems:    Problems with blood clotting too easily:        Skin    Rashes or ulcers:        Constitutional    Fever or chills:      PHYSICAL EXAMINATION:  Today's Vitals   11/03/23 1124  BP: (!) 152/77  Pulse: (!) 58  Resp: 18  Temp: 97.9 F (36.6 C)  TempSrc: Temporal  SpO2: 97%  Weight: 171 lb 8 oz (77.8 kg)  Height: 5\' 6"  (1.676 m)   Body mass index is 27.68 kg/m.   General:  WDWN in NAD; vital signs documented above Gait: Not observed HENT: WNL, normocephalic Pulmonary: normal non-labored breathing , without wheezing Cardiac: regular HR, without carotid bruits Abdomen: soft, NT; aortic pulse is not palpable Skin: without rashes Vascular Exam/Pulses:  Right Left  Radial 2+ (normal) 2+ (normal)  DP 2+ (normal) 2+ (normal)   Extremities: without ischemic changes, without Gangrene  , without cellulitis; without open wounds Musculoskeletal: no muscle wasting or atrophy  Neurologic: A&O X 3 Psychiatric:  The pt has Normal affect.   Non-Invasive Vascular Imaging:   ABI's/TBI's on 11/13/2023: Right:  1.01/0/61 - Great toe pressure: 91 Left:  1.01/0.64 - Great toe pressure: 96  Previous ABI's/TBI's on 10/22/2022: Right:  1.07/0.69 - Great toe pressure: 91 Left:  1.06/0.85 - Great toe pressure:  112     ASSESSMENT/PLAN:: 78 y.o. female here for follow up for PAD.  She was first seen by our office in 2022 with incidental findings of severe aortic atherosclerosis and returns today for follow up.     -pt is doing well with palpable DP pulses bilaterally and triphasic waveforms on her ABI.  She does not have any claudication, rest pain or non healing wounds.  Discussed with her to continue her statin and she can follow up as needed and call us if she has issues or we can continue yearly visit.  She would like to f/u as needed.   She knows to call if she develops any non healing wounds or rest pain.     Doreatha Massed, [facility] [phone]  Clinic MD:  Karin Lieu

## 2023-11-07 ENCOUNTER — Encounter: Payer: Self-pay | Admitting: *Deleted

## 2023-11-07 ENCOUNTER — Inpatient Hospital Stay: Payer: Medicare Other | Attending: Hematology and Oncology | Admitting: Hematology and Oncology

## 2023-11-07 VITALS — BP 143/55 | HR 73 | Temp 98.1°F | Resp 18 | Wt 171.9 lb

## 2023-11-07 DIAGNOSIS — D0511 Intraductal carcinoma in situ of right breast: Secondary | ICD-10-CM

## 2023-11-07 DIAGNOSIS — Z809 Family history of malignant neoplasm, unspecified: Secondary | ICD-10-CM | POA: Diagnosis not present

## 2023-11-07 DIAGNOSIS — Z170 Estrogen receptor positive status [ER+]: Secondary | ICD-10-CM | POA: Insufficient documentation

## 2023-11-07 NOTE — Assessment & Plan Note (Signed)
This is a very pleasant 78 year old female patient with newly diagnosed right breast DCIS ER/PR positive referred to breast [facility] for additional recommendations.  Breast Cancer Post lumpectomy with clear margins and no invasive cancer. Discussed the need for adjuvant hormonal therapy. Reviewed the mechanism of action, side effects, and risks of Tamoxifen and Aromatase Inhibitors. Patient to review information and decide on preferred therapy. -Print information on Tamoxifen and Aromatase Inhibitors for patient review. -Call patient in one week to discuss decision or receive decision from Dr. Dwain Sarna if made earlier. -Check for potential interaction with Methotrexate. -Once therapy started, follow-up in 3-4 months to assess tolerance.  General Health Maintenance -Continue annual diagnostic mammograms.

## 2023-11-07 NOTE — Progress Notes (Signed)
[facility] CONSULT NOTE  Patient Care Team: Ronnald Nian, MD as PCP - General (Family Medicine) Bensimhon, Bevelyn Buckles, MD as PCP - Cardiology (Cardiology) Emelia Loron, MD as Consulting Physician (General Surgery) Rachel Moulds, MD as Consulting Physician (Hematology and Oncology) Dorothy Puffer, MD as Consulting Physician (Radiation Oncology) Pershing Proud, RN as Registered Nurse Donnelly Angelica, RN as Registered Nurse  CHIEF COMPLAINTS/PURPOSE OF CONSULTATION:  Newly diagnosed breast cancer  HISTORY OF PRESENTING ILLNESS:  Rhonda Buchanan 78 y.o. female is here because of recent diagnosis of right breast DCIS  I reviewed her records extensively and collaborated the history with the patient.  SUMMARY OF ONCOLOGIC HISTORY: Oncology History  Ductal carcinoma in situ (DCIS) of right breast  08/04/2023 Mammogram   She had a screening mammogram in October 2024 which showed indeterminate right breast calcs, diagnostic mammogram was recommended, diagnostic mammogram also showed grouped linear pleomorphic calcs in the right breast suspicious of malignancy.  Stereotactic biopsy was recommended.   09/07/2023 Pathology Results   Right breast needle core biopsy at 12:00 middle depth showed intermediate grade DCIS, prognostic showed 90% positive ER strong staining, 40% positive PR weak staining   09/20/2023 Initial Diagnosis   Ductal carcinoma in situ (DCIS) of right breast   09/21/2023 Cancer Staging   Staging form: Breast, AJCC 8th Edition - Clinical stage from 09/21/2023: Stage 0 (cTis (DCIS), cN0, cM0, ER+, PR+) - Signed by Rachel Moulds, MD on 09/21/2023 Stage prefix: Initial diagnosis Nuclear grade: G2    Genetic Testing   [facility] CancerNext-Expanded Panel+RNA was Negative. Report date is 10/10/2023.   The CancerNext-Expanded gene panel offered by [facility] and includes sequencing, rearrangement, and RNA analysis for the following 76 genes: AIP, ALK, APC,  ATM, AXIN2, BAP1, BARD1, BMPR1A, BRCA1, BRCA2, BRIP1, CDC73, CDH1, CDK4, CDKN1B, CDKN2A, CEBPA, CHEK2, CTNNA1, DDX41, DICER1, ETV6, FH, FLCN, GATA2, LZTR1, MAX, MBD4, MEN1, MET, MLH1, MSH2, MSH3, MSH6, MUTYH, NF1, NF2, NTHL1, PALB2, PHOX2B, PMS2, POT1, PRKAR1A, PTCH1, PTEN, RAD51C, RAD51D, RB1, RET, RUNX1, SDHA, SDHAF2, SDHB, SDHC, SDHD, SMAD4, SMARCA4, SMARCB1, SMARCE1, STK11, SUFU, TMEM127, TP53, TSC1, TSC2, VHL, and WT1 (sequencing and deletion/duplication); EGFR, HOXB13, KIT, MITF, PDGFRA, POLD1, and POLE (sequencing only); EPCAM and GREM1 (deletion/duplication only).      This is a very pleasant 78 year old female patient with newly diagnosed right breast DCIS arrived with her daughter to the breast [facility]. She is healthy at baseline, has underlying dyslipidemia and scleroderma.  She has been on methotrexate for several years.    Discussed the use of AI scribe software for clinical note transcription with the patient, who gave verbal consent to proceed.  History of Present Illness    The patient, with a history of scleroderma and recent lumpectomy for breast cancer, presents for post-operative follow-up and discussion of adjuvant therapy. She reports no complications from the surgery, with no oozing from the incision, fevers, chills, or warmth. She has not yet had her post-operative visit with the surgeon.  She understands that both options are equally effective and that tamoxifen does not lower estrogen levels significantly, which could potentially strengthen her bone density. She is aware of the potential side effects of tamoxifen, including hot flashes, vaginal discharge, a small risk of blood clots, and a small risk of uterine problems including endometrial hyperplasia and cancer. She has no history of blood clots and is not aware of any family history of blood clots.  The patient is also considering an aromatase inhibitor, which would lower  her estrogen levels further. She understands that this  could lead to hot flashes, vaginal dryness, and bone density loss, but would not carry the risk of blood clots or uterine problems like tamoxifen. She reports having some calcifications, which are being monitored by a heart specialist.  The patient is unsure which option she prefers and has requested more information to help her make a decision.   Rest of the pertinent 10 point ROS reviewed and negative  MEDICAL HISTORY:  Past Medical History:  Diagnosis Date   Allergy    Arthritis    Asthma    Breast cancer (HCC)    Hypercholesterolemia    Hypertension    Peripheral arterial disease (HCC)    PONV (postoperative nausea and vomiting)    PUD (peptic ulcer disease)    Scleroderma (HCC)     SURGICAL HISTORY: Past Surgical History:  Procedure Laterality Date   arthroscopy     BREAST BIOPSY Right 09/07/2023   Ductal carcinoma in situ, intermediate grade; Calcifications present; 0.1 cm DCIS length.   BREAST LUMPECTOMY WITH RADIOACTIVE SEED LOCALIZATION Right 10/21/2023   Procedure: RIGHT BREAST SEED GUIDED LUMPECTOMY;  Surgeon: Emelia Loron, MD;  Location[facility] OR;  Service: General;  Laterality: Right;   CYST REMOVAL HAND     EYE SURGERY Left 10/25/2017   laser surgery    KNEE ARTHROPLASTY      SOCIAL HISTORY: Social History   Socioeconomic History   Marital status: Divorced    Spouse name: Not on file   Number of children: Not on file   Years of education: Not on file   Highest education level: Not on file  Occupational History   Not on file  Tobacco Use   Smoking status: Never    Passive exposure: Never   Smokeless tobacco: Never  Vaping Use   Vaping status: Never Used  Substance and Sexual Activity   Alcohol use: No   Drug use: No   Sexual activity: Not Currently  Other Topics Concern   Not on file  Social History Narrative   Not on file   Social Drivers of Health   Financial Resource Strain: Low Risk  (04/04/2023)   Overall Financial Resource Strain  (CARDIA)    Difficulty of Paying Living Expenses: Not hard at all  Food Insecurity: No Food Insecurity (09/21/2023)   Hunger Vital Sign    Worried About Running Out of Food in the Last Year: Never true    Ran Out of Food in the Last Year: Never true  Transportation Needs: No Transportation Needs (09/21/2023)   PRAPARE - Administrator, Civil Service (Medical): No    Lack of Transportation (Non-Medical): No  Physical Activity: Insufficiently Active (04/04/2023)   Exercise Vital Sign    Days of Exercise per Week: 3 days    Minutes of Exercise per Session: 20 min  Stress: No Stress Concern Present (04/04/2023)   [employer] of Occupational Health - Occupational Stress Questionnaire    Feeling of Stress : Not at all  Social Connections: Unknown (04/04/2023)   Social Connection and Isolation Panel [NHANES]    Frequency of Communication with Friends and Family: Twice a week    Frequency of Social Gatherings with Friends and Family: Once a week    Attends Religious Services: Not on Insurance claims handler of Clubs or Organizations: No    Attends Banker Meetings: Not on file    Marital Status: Divorced  Catering manager  Violence: Patient Declined (09/21/2023)   Humiliation, Afraid, Rape, and Kick questionnaire    Fear of Current or Ex-Partner: Patient declined    Emotionally Abused: Patient declined    Physically Abused: Patient declined    Sexually Abused: Patient declined    FAMILY HISTORY: Family History  Problem Relation Age of Onset   Cancer Father 58 - 39       unknown type   Ulcers Sister    High Cholesterol Sister    Ulcers Brother    Heart disease Brother 67       MI   Prostate cancer Maternal Uncle    Breast cancer Maternal Grandmother 68 - 69   Heart disease Maternal Grandfather    Cancer Cousin        unknown types, maternal first cousins    ALLERGIES:  is allergic to aspirin, ibuprofen, and oxycodone.  MEDICATIONS:  Current Outpatient  Medications  Medication Sig Dispense Refill   Brinzolamide-Brimonidine 1-0.2 % SUSP Place 1 drop into both eyes 3 (three) times daily.     folic acid (FOLVITE) 1 MG tablet Take 3 mg by mouth daily. Hold on the day se takes Methotrexate     lisinopril-hydrochlorothiazide (ZESTORETIC) 10-12.5 MG tablet Take 1 tablet by mouth daily. 90 tablet 3   methotrexate (RHEUMATREX) 2.5 MG tablet Take 15 mg by mouth once a week.  Caution:Chemotherapy. Protect from light.     metoprolol succinate (TOPROL-XL) 50 MG 24 hr tablet TAKE 1/2 TABLET BY MOUTH DAILY. TAKE WITH OR IMMEDIATELY FOLLOWING A MEAL. 45 tablet 1   multivitamin (THERAGRAN) per tablet Take 1 tablet by mouth daily.     potassium chloride (KLOR-CON) 10 MEQ tablet TAKE 1 TABLET BY MOUTH 2 TIMES DAILY. 180 tablet 1   prednisoLONE acetate (PRED FORTE) 1 % ophthalmic suspension Place 1 drop into both eyes daily.      rosuvastatin (CRESTOR) 40 MG tablet Take 1 tablet (40 mg total) by mouth daily. 90 tablet 3   timolol (TIMOPTIC) 0.5 % ophthalmic solution Place 1 drop into both eyes every morning.     No current facility-administered medications for this visit.    REVIEW OF SYSTEMS:   Constitutional: Denies fevers, chills or abnormal night sweats Eyes: Denies blurriness of vision, double vision or watery eyes Ears, nose, mouth, throat, and face: Denies mucositis or sore throat Respiratory: Denies cough, dyspnea or wheezes Cardiovascular: Denies palpitation, chest discomfort or lower extremity swelling Gastrointestinal:  Denies nausea, heartburn or change in bowel habits Skin: Denies abnormal skin rashes Lymphatics: Denies new lymphadenopathy or easy bruising Neurological:Denies numbness, tingling or new weaknesses Behavioral/Psych: Mood is stable, no new changes  Breast: Denies any palpable lumps or discharge All other systems were reviewed with the patient and are negative.  PHYSICAL EXAMINATION: ECOG PERFORMANCE STATUS: 0 -  Asymptomatic  Vitals:   11/07/23 1152 11/07/23 1153  BP: (!) 156/59 (!) 143/55  Pulse: 73   Resp: 18   Temp: 98.1 F (36.7 C)   SpO2: 100%     Filed Weights   11/07/23 1152  Weight: 171 lb 14.4 oz (78 kg)     GENERAL:alert, no distress and comfortable Rest of the physical exam deferred in lieu of counseling  LABORATORY DATA:  I have reviewed the data as listed Lab Results  Component Value Date   WBC 5.0 09/21/2023   HGB 13.5 09/21/2023   HCT 40.0 09/21/2023   MCV 95.9 09/21/2023   PLT 164 09/21/2023   Lab Results  Component  Value Date   NA 140 09/21/2023   K 3.1 (L) 09/21/2023   CL 104 09/21/2023   CO2 30 09/21/2023    RADIOGRAPHIC STUDIES: I have personally reviewed the radiological reports and agreed with the findings in the report.  ASSESSMENT AND PLAN:  Ductal carcinoma in situ (DCIS) of right breast This is a very pleasant 78 year old female patient with newly diagnosed right breast DCIS ER/PR positive referred to breast [facility] for additional recommendations.  Breast Cancer Post lumpectomy with clear margins and no invasive cancer. Discussed the need for adjuvant hormonal therapy. Reviewed the mechanism of action, side effects, and risks of Tamoxifen and Aromatase Inhibitors. Patient to review information and decide on preferred therapy. -Print information on Tamoxifen and Aromatase Inhibitors for patient review. -Call patient in one week to discuss decision or receive decision from Dr. Dwain Sarna if made earlier. -Check for potential interaction with Methotrexate. -Once therapy started, follow-up in 3-4 months to assess tolerance.  General Health Maintenance -Continue annual diagnostic mammograms.   All questions were answered. The patient knows to call the clinic with any problems, questions or concerns.    Rachel Moulds, MD 11/07/23

## 2023-11-08 ENCOUNTER — Telehealth: Payer: Self-pay | Admitting: Hematology and Oncology

## 2023-11-08 NOTE — Telephone Encounter (Signed)
Spoke with patient confirming upcoming appointment  

## 2023-11-15 ENCOUNTER — Inpatient Hospital Stay (HOSPITAL_BASED_OUTPATIENT_CLINIC_OR_DEPARTMENT_OTHER): Payer: Medicare Other | Admitting: Hematology and Oncology

## 2023-11-15 DIAGNOSIS — D0511 Intraductal carcinoma in situ of right breast: Secondary | ICD-10-CM

## 2023-11-15 MED ORDER — ANASTROZOLE 1 MG PO TABS: 1.0000 mg | ORAL_TABLET | Freq: Every day | ORAL | 3 refills | Status: DC

## 2023-11-15 NOTE — Assessment & Plan Note (Addendum)
This is a very pleasant 78 year old female patient with newly diagnosed right breast DCIS ER/PR positive referred to breast [facility] for additional recommendations.  Breast Cancer Post lumpectomy with clear margins and no invasive cancer. Discussed the need for adjuvant hormonal therapy. Reviewed the mechanism of action, side effects, and risks of Tamoxifen and Aromatase Inhibitors. Patient to review information and decide on preferred therapy. -This is a follow-up telephone visit to see if she would like to continue with tamoxifen or aromatase inhibitors.  She had done some reading and wants to consider aromatase inhibitors.  She tells me that she is very worried about the effect of dyslipidemia with tamoxifen as well as risk of DVT/PE.  We have discussed that cholesterol profile in the ED can be affected by both class of drugs.  She understands that with aromatase inhibitors, we will have to consider bone density screening, her last bone density was several years ago hence I ordered it again today.  Encouraged regular activity calcium and vitamin D supplementation for bone health.  Rachel Moulds MD

## 2023-11-15 NOTE — Progress Notes (Signed)
[facility] CONSULT NOTE  Patient Care Team: Ronnald Nian, MD as PCP - General (Family Medicine) Bensimhon, Bevelyn Buckles, MD as PCP - Cardiology (Cardiology) Emelia Loron, MD as Consulting Physician (General Surgery) Rachel Moulds, MD as Consulting Physician (Hematology and Oncology) Dorothy Puffer, MD as Consulting Physician (Radiation Oncology) Pershing Proud, RN as Registered Nurse Donnelly Angelica, RN as Registered Nurse  CHIEF COMPLAINTS/PURPOSE OF CONSULTATION:  Newly diagnosed breast cancer  HISTORY OF PRESENTING ILLNESS:  Rhonda Buchanan 78 y.o. female is here because of recent diagnosis of right breast DCIS  I reviewed her records extensively and collaborated the history with the patient.  SUMMARY OF ONCOLOGIC HISTORY: Oncology History  Ductal carcinoma in situ (DCIS) of right breast  08/04/2023 Mammogram   She had a screening mammogram in October 2024 which showed indeterminate right breast calcs, diagnostic mammogram was recommended, diagnostic mammogram also showed grouped linear pleomorphic calcs in the right breast suspicious of malignancy.  Stereotactic biopsy was recommended.   09/07/2023 Pathology Results   Right breast needle core biopsy at 12:00 middle depth showed intermediate grade DCIS, prognostic showed 90% positive ER strong staining, 40% positive PR weak staining   09/20/2023 Initial Diagnosis   Ductal carcinoma in situ (DCIS) of right breast   09/21/2023 Cancer Staging   Staging form: Breast, AJCC 8th Edition - Clinical stage from 09/21/2023: Stage 0 (cTis (DCIS), cN0, cM0, ER+, PR+) - Signed by Rachel Moulds, MD on 09/21/2023 Stage prefix: Initial diagnosis Nuclear grade: G2    Genetic Testing   [facility] CancerNext-Expanded Panel+RNA was Negative. Report date is 10/10/2023.   The CancerNext-Expanded gene panel offered by [facility] and includes sequencing, rearrangement, and RNA analysis for the following 76 genes: AIP, ALK, APC,  ATM, AXIN2, BAP1, BARD1, BMPR1A, BRCA1, BRCA2, BRIP1, CDC73, CDH1, CDK4, CDKN1B, CDKN2A, CEBPA, CHEK2, CTNNA1, DDX41, DICER1, ETV6, FH, FLCN, GATA2, LZTR1, MAX, MBD4, MEN1, MET, MLH1, MSH2, MSH3, MSH6, MUTYH, NF1, NF2, NTHL1, PALB2, PHOX2B, PMS2, POT1, PRKAR1A, PTCH1, PTEN, RAD51C, RAD51D, RB1, RET, RUNX1, SDHA, SDHAF2, SDHB, SDHC, SDHD, SMAD4, SMARCA4, SMARCB1, SMARCE1, STK11, SUFU, TMEM127, TP53, TSC1, TSC2, VHL, and WT1 (sequencing and deletion/duplication); EGFR, HOXB13, KIT, MITF, PDGFRA, POLD1, and POLE (sequencing only); EPCAM and GREM1 (deletion/duplication only).      This is a very pleasant 78 year old female patient with newly diagnosed right breast DCIS arrived with her daughter to the breast [facility]. She is healthy at baseline, has underlying dyslipidemia and scleroderma.  She has been on methotrexate for several years.    Discussed the use of AI scribe software for clinical note transcription with the patient, who gave verbal consent to proceed.  History of Present Illness   She is here for telephone follow-up.  She had enough time to review information and she wants to try the anastrozole.  She said she is worried about the cholesterol profile being affected by tamoxifen as well as the risk of blood clots with tamoxifen.  She also had a postop visit with Dr. Dwain Sarna and everything peers to be satisfactory.  Rest of the pertinent 10 point ROS reviewed and negative  MEDICAL HISTORY:  Past Medical History:  Diagnosis Date   Allergy    Arthritis    Asthma    Breast cancer (HCC)    Hypercholesterolemia    Hypertension    Peripheral arterial disease (HCC)    PONV (postoperative nausea and vomiting)    PUD (peptic ulcer disease)    Scleroderma (HCC)     SURGICAL  HISTORY: Past Surgical History:  Procedure Laterality Date   arthroscopy     BREAST BIOPSY Right 09/07/2023   Ductal carcinoma in situ, intermediate grade; Calcifications present; 0.1 cm DCIS length.   BREAST  LUMPECTOMY WITH RADIOACTIVE SEED LOCALIZATION Right 10/21/2023   Procedure: RIGHT BREAST SEED GUIDED LUMPECTOMY;  Surgeon: Emelia Loron, MD;  Location: [facility] OR;  Service: General;  Laterality: Right;   CYST REMOVAL HAND     EYE SURGERY Left 10/25/2017   laser surgery    KNEE ARTHROPLASTY      SOCIAL HISTORY: Social History   Socioeconomic History   Marital status: Divorced    Spouse name: Not on file   Number of children: Not on file   Years of education: Not on file   Highest education level: Not on file  Occupational History   Not on file  Tobacco Use   Smoking status: Never    Passive exposure: Never   Smokeless tobacco: Never  Vaping Use   Vaping status: Never Used  Substance and Sexual Activity   Alcohol use: No   Drug use: No   Sexual activity: Not Currently  Other Topics Concern   Not on file  Social History Narrative   Not on file   Social Drivers of Health   Financial Resource Strain: Low Risk  (04/04/2023)   Overall Financial Resource Strain (CARDIA)    Difficulty of Paying Living Expenses: Not hard at all  Food Insecurity: No Food Insecurity (09/21/2023)   Hunger Vital Sign    Worried About Running Out of Food in the Last Year: Never true    Ran Out of Food in the Last Year: Never true  Transportation Needs: No Transportation Needs (09/21/2023)   PRAPARE - Administrator, Civil Service (Medical): No    Lack of Transportation (Non-Medical): No  Physical Activity: Insufficiently Active (04/04/2023)   Exercise Vital Sign    Days of Exercise per Week: 3 days    Minutes of Exercise per Session: 20 min  Stress: No Stress Concern Present (04/04/2023)   [employer] of Occupational Health - Occupational Stress Questionnaire    Feeling of Stress : Not at all  Social Connections: Unknown (04/04/2023)   Social Connection and Isolation Panel [NHANES]    Frequency of Communication with Friends and Family: Twice a week    Frequency of Social  Gatherings with Friends and Family: Once a week    Attends Religious Services: Not on Insurance claims handler of Clubs or Organizations: No    Attends Banker Meetings: Not on file    Marital Status: Divorced  Intimate Partner Violence: Patient Declined (09/21/2023)   Humiliation, Afraid, Rape, and Kick questionnaire    Fear of Current or Ex-Partner: Patient declined    Emotionally Abused: Patient declined    Physically Abused: Patient declined    Sexually Abused: Patient declined    FAMILY HISTORY: Family History  Problem Relation Age of Onset   Cancer Father 72 - 30       unknown type   Ulcers Sister    High Cholesterol Sister    Ulcers Brother    Heart disease Brother 41       MI   Prostate cancer Maternal Uncle    Breast cancer Maternal Grandmother 59 - 69   Heart disease Maternal Grandfather    Cancer Cousin        unknown types, maternal first cousins    ALLERGIES:  is  allergic to aspirin, ibuprofen, and oxycodone.  MEDICATIONS:  Current Outpatient Medications  Medication Sig Dispense Refill   anastrozole (ARIMIDEX) 1 MG tablet Take 1 tablet (1 mg total) by mouth daily. 90 tablet 3   Brinzolamide-Brimonidine 1-0.2 % SUSP Place 1 drop into both eyes 3 (three) times daily.     folic acid (FOLVITE) 1 MG tablet Take 3 mg by mouth daily. Hold on the day se takes Methotrexate     lisinopril-hydrochlorothiazide (ZESTORETIC) 10-12.5 MG tablet Take 1 tablet by mouth daily. 90 tablet 3   methotrexate (RHEUMATREX) 2.5 MG tablet Take 15 mg by mouth once a week.  Caution:Chemotherapy. Protect from light.     metoprolol succinate (TOPROL-XL) 50 MG 24 hr tablet TAKE 1/2 TABLET BY MOUTH DAILY. TAKE WITH OR IMMEDIATELY FOLLOWING A MEAL. 45 tablet 1   multivitamin (THERAGRAN) per tablet Take 1 tablet by mouth daily.     potassium chloride (KLOR-CON) 10 MEQ tablet TAKE 1 TABLET BY MOUTH 2 TIMES DAILY. 180 tablet 1   prednisoLONE acetate (PRED FORTE) 1 % ophthalmic suspension  Place 1 drop into both eyes daily.      rosuvastatin (CRESTOR) 40 MG tablet Take 1 tablet (40 mg total) by mouth daily. 90 tablet 3   timolol (TIMOPTIC) 0.5 % ophthalmic solution Place 1 drop into both eyes every morning.     No current facility-administered medications for this visit.    REVIEW OF SYSTEMS:   Constitutional: Denies fevers, chills or abnormal night sweats Eyes: Denies blurriness of vision, double vision or watery eyes Ears, nose, mouth, throat, and face: Denies mucositis or sore throat Respiratory: Denies cough, dyspnea or wheezes Cardiovascular: Denies palpitation, chest discomfort or lower extremity swelling Gastrointestinal:  Denies nausea, heartburn or change in bowel habits Skin: Denies abnormal skin rashes Lymphatics: Denies new lymphadenopathy or easy bruising Neurological:Denies numbness, tingling or new weaknesses Behavioral/Psych: Mood is stable, no new changes  Breast: Denies any palpable lumps or discharge All other systems were reviewed with the patient and are negative.  PHYSICAL EXAMINATION: ECOG PERFORMANCE STATUS: 0 - Asymptomatic  PE deferred, telephone visit.  LABORATORY DATA:  I have reviewed the data as listed Lab Results  Component Value Date   WBC 5.0 09/21/2023   HGB 13.5 09/21/2023   HCT 40.0 09/21/2023   MCV 95.9 09/21/2023   PLT 164 09/21/2023   Lab Results  Component Value Date   NA 140 09/21/2023   K 3.1 (L) 09/21/2023   CL 104 09/21/2023   CO2 30 09/21/2023    RADIOGRAPHIC STUDIES: I have personally reviewed the radiological reports and agreed with the findings in the report.  ASSESSMENT AND PLAN:  Ductal carcinoma in situ (DCIS) of right breast This is a very pleasant 78 year old female patient with newly diagnosed right breast DCIS ER/PR positive referred to breast M[facility] for additional recommendations.  Breast Cancer Post lumpectomy with clear margins and no invasive cancer. Discussed the need for adjuvant hormonal  therapy. Reviewed the mechanism of action, side effects, and risks of Tamoxifen and Aromatase Inhibitors. Patient to review information and decide on preferred therapy. -This is a follow-up telephone visit to see if she would like to continue with tamoxifen or aromatase inhibitors.  She had done some reading and wants to consider aromatase inhibitors.  She tells me that she is very worried about the effect of dyslipidemia with tamoxifen as well as risk of DVT/PE.  We have discussed that cholesterol profile in the ED can be affected by both class  of drugs.  She understands that with aromatase inhibitors, we will have to consider bone density screening, her last bone density was several years ago hence I ordered it again today.  Encouraged regular activity calcium and vitamin D supplementation for bone health.  Rachel Moulds MD   I connected with  Rhonda Buchanan on 11/15/23 by a telephone application and verified that I am speaking with the correct person using two identifiers.   I discussed the limitations of evaluation and management by telemedicine. The patient expressed understanding and agreed to proceed.  Time spent: 10 min Location of provider: office Location of provider: Home  All questions were answered. The patient knows to call the clinic with any problems, questions or concerns.    Rachel Moulds, MD 11/15/23

## 2023-11-29 ENCOUNTER — Ambulatory Visit (HOSPITAL_BASED_OUTPATIENT_CLINIC_OR_DEPARTMENT_OTHER)
Admission: RE | Admit: 2023-11-29 | Discharge: 2023-11-29 | Disposition: A | Discharge status: Home / Self Care | Payer: Medicare Other | Source: Ambulatory Visit | Attending: Hematology and Oncology | Admitting: Hematology and Oncology

## 2023-11-29 DIAGNOSIS — D0511 Intraductal carcinoma in situ of right breast: Secondary | ICD-10-CM | POA: Diagnosis not present

## 2023-11-29 DIAGNOSIS — M85852 Other specified disorders of bone density and structure, left thigh: Secondary | ICD-10-CM | POA: Diagnosis not present

## 2023-11-29 DIAGNOSIS — Z13820 Encounter for screening for osteoporosis: Secondary | ICD-10-CM | POA: Diagnosis not present

## 2023-12-05 ENCOUNTER — Other Ambulatory Visit: Payer: Self-pay | Admitting: Family Medicine

## 2023-12-05 DIAGNOSIS — I10 Essential (primary) hypertension: Secondary | ICD-10-CM

## 2023-12-05 NOTE — Progress Notes (Signed)
[facility]  NOTE  Primary Care: Dr. Malva Limes HF Cardiologist: Dr. Gala Romney  HPI: Rhonda Buchanan is a 78 y.o. female with HTN, morphea scleroderma and R breast DCIS.  Sleep study 8/22 negative    Echo 09/13/23  EF 65-70% RV normal RVSP   PFTS 09/13/23  FEV1  2.06 (93%) FVC    2.41L (88%) DLCO 66%  S/p R breast lumpectomy 1/25, clear margins and no invasive CA. Followed by Heme/Onc, started on adjuvant hormonal therapy, aromatase inhibitor Arimedix.  Today she returns for HF follow up with her sister. Overall feeling fine. Had wheezing a couple weeks ago taking out trash, no episodes since. No SOB with ADLS. Feels fatigued "all the time." Naps during the day, sleeps OK at night. Denies palpitations, abnormal bleeding, CP, dizziness, edema, or PND/Orthopnea. Appetite ok. No fever or chills. Weight at home 169 pounds. Taking all medications. Can feel her pulse throughout her body.   Cardiac Studies  - Echo 09/13/23  EF 65-70% RV normal RVSP  - PFTS 09/13/23  FEV1  2.06 (93%) FVC    2.41L (88%) DLCO 66%  - Echo 08/12/22: EF 60-65% G1DD RV normal. Mild TR RVSP 30-57mmHG  PFTS 08/12/22:  FEV1  2.08 (93%) FVC    2.64L (88%) DLCO 72%  Sleep study 8/22 negative   - Echo 08/15/20: EF 60-65% RV normal. No evidence of PAH. Aov calcified no significant AS Personally reviewed  PFTs 10/21 FEV1  1.86 (98%) FVC    2.25 L (92%) DLCO 73%  PFTs 2/20 FEV1  2.0L (111%) FVC    2.4L (103% DLCO 70%   PFTs 2017  FEV1 2.07 (109%) FVC  2.54 (104%) DLCO 64%  PFTs 08/12/17-  FEV1 2.03 (109%) FVC  2.43 (101%) DLCO 60%  09/2017 Stress Echo EF 65-70%, Grade 1 DD. No chest pain. + Hypertensive BP response. No stress induced WMAs.   08/12/2017 Echo 60-65%. No evidence PAH. Mild AS Normal RV   Hi-res CT of chest done 8/17 for abnormal DLCO 1. No evidence of interstitial lung disease. 2. No evidence of achalasia. 3. No acute findings in the thorax. 4.  Aortic atherosclerosis, in addition to left main and 3 vessel coronary artery disease. Please note that although the presence of coronary artery calcium documents the presence of coronary artery disease, the severity of this disease and any potential stenosis cannot be assessed on this non-gated CT examination. Assessment for potential risk factor modification, dietary therapy or pharmacologic therapy may be warranted, if clinically indicated. 5. There are calcifications of the aortic valve. Echocardiographic correlation for evaluation of potential valvular dysfunction may be warranted if clinically indicated.  Past Medical History:  Diagnosis Date   Allergy    Arthritis    Asthma    Breast cancer (HCC)    Hypercholesterolemia    Hypertension    Peripheral arterial disease (HCC)    PONV (postoperative nausea and vomiting)    PUD (peptic ulcer disease)    Scleroderma (HCC)    Current Outpatient Medications  Medication Sig Dispense Refill   anastrozole (ARIMIDEX) 1 MG tablet Take 1 tablet (1 mg total) by mouth daily. 90 tablet 3   Brinzolamide-Brimonidine 1-0.2 % SUSP Place 1 drop into both eyes 3 (three) times daily.     folic acid (FOLVITE) 1 MG tablet Take 3 mg by mouth daily. Hold on the day when  takes Methotrexate     lisinopril-hydrochlorothiazide (ZESTORETIC) 10-12.5 MG tablet Take 1 tablet  by mouth daily. 90 tablet 3   methotrexate (RHEUMATREX) 2.5 MG tablet Take 15 mg by mouth once a week.  Caution:Chemotherapy. Protect from light.     metoprolol succinate (TOPROL-XL) 50 MG 24 hr tablet TAKE 1/2 TABLET BY MOUTH DAILY. TAKE WITH OR IMMEDIATELY FOLLOWING A MEAL. 45 tablet 0   potassium chloride (KLOR-CON) 10 MEQ tablet TAKE 1 TABLET BY MOUTH 2 TIMES DAILY. 180 tablet 1   prednisoLONE acetate (PRED FORTE) 1 % ophthalmic suspension Place 1 drop into both eyes daily.      rosuvastatin (CRESTOR) 40 MG tablet Take 1 tablet (40 mg total) by mouth daily. 90 tablet 3   timolol  (TIMOPTIC) 0.5 % ophthalmic solution Place 1 drop into both eyes every morning.     No current facility-administered medications for this encounter.   Allergies  Allergen Reactions   Aspirin Nausea Only   Ibuprofen Nausea Only    GI upset   Oxycodone Other (See Comments)    Gi- upset   Social History   Socioeconomic History   Marital status: Divorced    Spouse name: Not on file   Number of children: Not on file   Years of education: Not on file   Highest education level: Not on file  Occupational History   Not on file  Tobacco Use   Smoking status: Never    Passive exposure: Never   Smokeless tobacco: Never  Vaping Use   Vaping status: Never Used  Substance and Sexual Activity   Alcohol use: No   Drug use: No   Sexual activity: Not Currently  Other Topics Concern   Not on file  Social History Narrative   Not on file   Social Drivers of Health   Financial Resource Strain: Low Risk  (04/04/2023)   Overall Financial Resource Strain (CARDIA)    Difficulty of Paying Living Expenses: Not hard at all  Food Insecurity: No Food Insecurity (09/21/2023)   Hunger Vital Sign    Worried About Running Out of Food in the Last Year: Never true    Ran Out of Food in the Last Year: Never true  Transportation Needs: No Transportation Needs (09/21/2023)   PRAPARE - Administrator, Civil Service (Medical): No    Lack of Transportation (Non-Medical): No  Physical Activity: Insufficiently Active (04/04/2023)   Exercise Vital Sign    Days of Exercise per Week: 3 days    Minutes of Exercise per Session: 20 min  Stress: No Stress Concern Present (04/04/2023)   [employer] of Occupational Health - Occupational Stress Questionnaire    Feeling of Stress : Not at all  Social Connections: Unknown (04/04/2023)   Social Connection and Isolation Panel [NHANES]    Frequency of Communication with Friends and Family: Twice a week    Frequency of Social Gatherings with Friends and  Family: Once a week    Attends Religious Services: Not on Insurance claims handler of Clubs or Organizations: No    Attends Banker Meetings: Not on file    Marital Status: Divorced  Intimate Partner Violence: Patient Declined (09/21/2023)   Humiliation, Afraid, Rape, and Kick questionnaire    Fear of Current or Ex-Partner: Patient declined    Emotionally Abused: Patient declined    Physically Abused: Patient declined    Sexually Abused: Patient declined   Family History  Problem Relation Age of Onset   Cancer Father 26 - 42       unknown  type   Ulcers Sister    High Cholesterol Sister    Ulcers Brother    Heart disease Brother 92       MI   Prostate cancer Maternal Uncle    Breast cancer Maternal Grandmother 29 - 69   Heart disease Maternal Grandfather    Cancer Cousin        unknown types, maternal first cousins   BP 138/70   Pulse 80   Wt 75.1 kg (165 lb 9.6 oz)   LMP 02/16/2003   SpO2 100%   BMI 26.73 kg/m   Wt Readings from Last 3 Encounters:  12/07/23 75.1 kg (165 lb 9.6 oz)  11/07/23 78 kg (171 lb 14.4 oz)  11/03/23 77.8 kg (171 lb 8 oz)    PHYSICAL EXAM: General:  NAD. No resp difficulty, walked into clinic HEENT: Normal Neck: Supple. No JVD. Cor: Regular rate & rhythm. No rubs, gallops, 2/6 SEM Lungs: Clear Abdomen: Soft, nontender, nondistended.  Extremities: No cyanosis, clubbing, rash, edema Neuro: Alert & oriented x 3, moves all 4 extremities w/o difficulty. Affect pleasant.  ECG (personally reviewed): NSR 70 bpm  ASSESSMENT & PLAN: 1. Scleroderma - 09/2017 Stress Echo EF 65-70%, Grade 1 DD. No chest pain. + Hypertensive BP response. No stress induced WMAs - Echo 2/20 EF 55-60% Grade II DD. Thickened Aov without overt AS - Echo 08/15/20 : EF 60-65% RV normal. No evidence of PAH. Aov calcified no significant AS  - Echo 08/12/22 EF 60-65% G1DD RV normal. Mild TR RVSP 30-72mmHG - Echo 09/13/23  EF 65-70% RV normal RVSP Aov calcified  no significant AS Personally reviewed - Echo and PFTs stable, no role for RHC currently - Continue screening every 1-2 years  2. HTN - BP up a bit - Increase lisinopril/hydrochlorothiazide to 20/12.5 daily - BMET today, repeat in 2 weeks.  - I asked her to check BP at home  3. Very mild aortic stenosis - AoV quite calcified, but no frank AS.  - No change on echo 11/24  4. Coronary calcifications - By CT scan. - Stress Echo ( 2/18): EF 65-70%, Grade 1 DD. No chest pain. + Hypertensive BP response. No stress induced WMAs - No s/s of ischemia    - On statin  5. Fatigue - Had home sleep study which was normal - No change  6. DCIS R Breast  - Recently diagnosed.  - now s/p lumpectomy - followed by Dr. Al Pimple - Now on anastrozole   Follow up in 9 months with Dr. Gala Romney + echo and PFTs, as scheduled.  Jacklynn Ganong, FNP  11:43 AM

## 2023-12-07 ENCOUNTER — Ambulatory Visit (HOSPITAL_COMMUNITY)
Admission: RE | Admit: 2023-12-07 | Discharge: 2023-12-07 | Discharge status: Home / Self Care | Payer: Medicare Other | Source: Ambulatory Visit | Attending: Family Medicine

## 2023-12-07 ENCOUNTER — Encounter (HOSPITAL_COMMUNITY): Payer: Self-pay

## 2023-12-07 VITALS — BP 136/70 | HR 80 | Wt 165.6 lb

## 2023-12-07 DIAGNOSIS — Z79811 Long term (current) use of aromatase inhibitors: Secondary | ICD-10-CM | POA: Diagnosis not present

## 2023-12-07 DIAGNOSIS — M349 Systemic sclerosis, unspecified: Secondary | ICD-10-CM | POA: Diagnosis not present

## 2023-12-07 DIAGNOSIS — I350 Nonrheumatic aortic (valve) stenosis: Secondary | ICD-10-CM | POA: Diagnosis not present

## 2023-12-07 DIAGNOSIS — I2510 Atherosclerotic heart disease of native coronary artery without angina pectoris: Secondary | ICD-10-CM | POA: Diagnosis not present

## 2023-12-07 DIAGNOSIS — R5383 Other fatigue: Secondary | ICD-10-CM | POA: Insufficient documentation

## 2023-12-07 DIAGNOSIS — D0511 Intraductal carcinoma in situ of right breast: Secondary | ICD-10-CM | POA: Diagnosis not present

## 2023-12-07 DIAGNOSIS — R942 Abnormal results of pulmonary function studies: Secondary | ICD-10-CM | POA: Insufficient documentation

## 2023-12-07 DIAGNOSIS — I10 Essential (primary) hypertension: Secondary | ICD-10-CM | POA: Insufficient documentation

## 2023-12-07 DIAGNOSIS — Z79899 Other long term (current) drug therapy: Secondary | ICD-10-CM | POA: Insufficient documentation

## 2023-12-07 LAB — BASIC METABOLIC PANEL
Anion gap: 8 (ref 5–15)
BUN: 16 mg/dL (ref 8–23)
CO2: 30 mmol/L (ref 22–32)
Calcium: 10.4 mg/dL — ABNORMAL HIGH (ref 8.9–10.3)
Chloride: 103 mmol/L (ref 98–111)
Creatinine, Ser: 1.62 mg/dL — ABNORMAL HIGH (ref 0.44–1.00)
GFR, Estimated: 32 mL/min — ABNORMAL LOW (ref >60)
Glucose, Bld: 90 mg/dL (ref 70–99)
Potassium: 3.5 mmol/L (ref 3.5–5.1)
Sodium: 141 mmol/L (ref 135–145)

## 2023-12-07 LAB — BRAIN NATRIURETIC PEPTIDE: B Natriuretic Peptide: 47.3 pg/mL (ref 0.0–100.0)

## 2023-12-07 MED ORDER — LISINOPRIL-HYDROCHLOROTHIAZIDE 20-12.5 MG PO TABS: 1.0000 | ORAL_TABLET | Freq: Every day | ORAL | 3 refills | Status: DC

## 2023-12-07 NOTE — Patient Instructions (Signed)
Medication Changes:  INCREASE LISINOPRIL/hydrochlorothiazide TO 20/12.5MG  ONCE DAILY   Lab Work:  Labs done today, your results will be available in MyChart, we will contact you for abnormal readings.  THEN RETURN AGAIN IN 2 WEEKS AS SCHEDULED FOR REPEAT LABS   Follow-Up in: 9 MONTHS WITH AN ECHO AND PFTS- WITH DR. Gala Romney   At the [facility], you and your health needs are our priority. We have a designated team specialized in the treatment of Heart Failure. This Care Team includes your primary Heart Failure Specialized Cardiologist (physician), Advanced Practice Providers (APPs- Physician Assistants and Nurse Practitioners), and Pharmacist who all work together to provide you with the care you need, when you need it.   You may see any of the following providers on your designated Care Team at your next follow up:  Dr. Arvilla Meres Dr. Marca Ancona Dr. Dorthula Nettles Dr. Theresia Bough Tonye Becket, NP Robbie Lis, Aaradhya [facility] [place], [place] Brynda Peon, NP [place] Lee, NP Karle Plumber, PharmD   Please be sure to bring in all your medications bottles to every appointment.   Need to Contact us:  If you have any questions or concerns before your next appointment please send [place] a message through [place] or call our office at [phone].    TO LEAVE A MESSAGE FOR THE NURSE SELECT OPTION 2, PLEASE LEAVE A MESSAGE INCLUDING: YOUR NAME DATE OF BIRTH CALL BACK NUMBER REASON FOR CALL**this is important as we prioritize the call backs  YOU WILL RECEIVE A CALL BACK THE SAME DAY AS LONG AS YOU CALL BEFORE 4:00 PM

## 2023-12-12 DIAGNOSIS — K7401 Hepatic fibrosis, early fibrosis: Secondary | ICD-10-CM | POA: Diagnosis not present

## 2023-12-12 DIAGNOSIS — Z79631 Long term (current) use of antimetabolite agent: Secondary | ICD-10-CM | POA: Diagnosis not present

## 2023-12-12 DIAGNOSIS — K760 Fatty (change of) liver, not elsewhere classified: Secondary | ICD-10-CM | POA: Diagnosis not present

## 2023-12-13 ENCOUNTER — Encounter: Payer: Self-pay | Admitting: Internal Medicine

## 2023-12-21 ENCOUNTER — Telehealth (HOSPITAL_COMMUNITY): Payer: Self-pay

## 2023-12-21 ENCOUNTER — Ambulatory Visit (HOSPITAL_COMMUNITY)
Admission: RE | Admit: 2023-12-21 | Discharge: 2023-12-21 | Disposition: A | Discharge status: Home / Self Care | Payer: Medicare Other | Source: Ambulatory Visit | Attending: Cardiology | Admitting: Cardiology

## 2023-12-21 DIAGNOSIS — I10 Essential (primary) hypertension: Secondary | ICD-10-CM | POA: Insufficient documentation

## 2023-12-21 LAB — BASIC METABOLIC PANEL
Anion gap: 10 (ref 5–15)
BUN: 22 mg/dL (ref 8–23)
CO2: 25 mmol/L (ref 22–32)
Calcium: 9.8 mg/dL (ref 8.9–10.3)
Chloride: 102 mmol/L (ref 98–111)
Creatinine, Ser: 1.42 mg/dL — ABNORMAL HIGH (ref 0.44–1.00)
GFR, Estimated: 38 mL/min — ABNORMAL LOW (ref >60)
Glucose, Bld: 111 mg/dL — ABNORMAL HIGH (ref 70–99)
Potassium: 3.5 mmol/L (ref 3.5–5.1)
Sodium: 137 mmol/L (ref 135–145)

## 2023-12-21 LAB — BRAIN NATRIURETIC PEPTIDE: B Natriuretic Peptide: 54.4 pg/mL (ref 0.0–100.0)

## 2023-12-21 NOTE — Telephone Encounter (Signed)
 Patient came into office for labs this morning and requested BP check due to recent change in lisinopril-hydrochlorothiazide   Patients blood pressure today in clinic was 128/72- she has had her morning medications. Patient reports that she feels well and denies any issues or symptoms.   Patient reports she has been getting high and low blood pressures on her home BP cuff- doesn't think that this is well calibrated.   Provided patient with BP cuff today in clinic, and blood pressure log. Advised patient to call if blood pressure is consistently elevated or consistently low- or if any symptoms. Patient verbalized understanding of all instructions.

## 2023-12-27 ENCOUNTER — Ambulatory Visit: Payer: Self-pay | Admitting: Family Medicine

## 2023-12-27 NOTE — Telephone Encounter (Signed)
 Chief Complaint: Back pain  Symptoms: lower back pain mild to moderate, "foam" urine or cloudy Frequency: Comes and goes  Pertinent Negatives: Patient denies fever, urinary burning, pain or discomfort Disposition: [] ED /[] Urgent Care (no appt availability in office) / [x] Appointment(In office/virtual)/ []  [facility]/ [] Home Care/ [] Refused Recommended Disposition /[] [facility]/ []  Follow-up with PCP Additional Notes: Patient stated she has had lower back pain that comes and goes for the past 1 month. Patient has not been seen by a provider for this issue. Patient states her kidney function labs have been abnormal lately and her urine is "foamy" patient denies all other urine symptoms. Care advice was given and patient has been scheduled for an office visit on Thursday for evaluation.   Copied from CRM [id number]. Topic: Clinical - Red Word Triage >> Dec 27, 2023  2:41 PM Elle L wrote: Red Word that prompted transfer to Nurse Triage: The patient had lab work done that had abnormal results in regards to her kidney function. She states that she has been having back pain and foam in her urine. Reason for Disposition  Back pain present > 2 weeks  Answer Assessment - Initial Assessment Questions 1. ONSET: "When did the pain begin?"      About 1 month 2. LOCATION: "Where does it hurt?" (upper, mid or lower back)     Lower back pain  3. SEVERITY: "How bad is the pain?"  (e.g., Scale 1-10; mild, moderate, or severe)   - MILD (1-3): Doesn't interfere with normal activities.    - MODERATE (4-7): Interferes with normal activities or awakens from sleep.    - SEVERE (8-10): Excruciating pain, unable to do any normal activities.      3/10 4. PATTERN: "Is the pain constant?" (e.g., yes, no; constant, intermittent)      Comes and goes  5. RADIATION: "Does the pain shoot into your legs or somewhere else?"     No  6. CAUSE:  "What do you think is causing the back pain?"      No  7.  BACK OVERUSE:  "Any recent lifting of heavy objects, strenuous work or exercise?"     No  8. MEDICINES: "What have you taken so far for the pain?" (e.g., nothing, acetaminophen, NSAIDS)     Nothing  9. NEUROLOGIC SYMPTOMS: "Do you have any weakness, numbness, or problems with bowel/bladder control?"     No  10. OTHER SYMPTOMS: "Do you have any other symptoms?" (e.g., fever, abdomen pain, burning with urination, blood in urine)       "Foam" in urine  Protocols used: Back Pain-A-AH

## 2023-12-28 NOTE — Progress Notes (Unsigned)
 No chief complaint on file.    Patient states she has had lower back pain that comes and goes for the past 1 month. Patient states her kidney function labs have been abnormal lately and her urine is "foamy". Denies dysuria, hematuria, urgency.  HTN--she saw cardiologist last month.  BP was above goal and they Increased dose of lisinopril/hydrochlorothiazide to 20/12.5 daily  Higher Cr noted at that visit. Repeat last week (f/u due to dose change) was somewhat better, but still higher than baseline.  BP Readings from Last 3 Encounters:  12/07/23 136/70  11/07/23 (!) 143/55  11/03/23 (!) 152/77            Component Ref Range & Units (hover) 7 d ago (12/21/23) 3 wk ago (12/07/23) 3 mo ago (09/21/23) 8 mo ago (04/26/23) 9 mo ago (03/21/23) 9 mo ago (03/07/23) 1 yr ago (10/19/22)  Sodium 137 141 140 141 R 140 R 136 143 R  Potassium 3.5 3.5 3.1 Low  3.7 R 3.2 Low  R 2.7 Low Panic  CM 3.5 R  Chloride 102 103 104 105 R 101 R 101 103 R  CO2 25 30 30 21  R 25 R 27 32 R  Glucose, Bld 111 High  90 CM 90 CM 96 92 106 High  CM 77 R, CM  Comment: Glucose reference range applies only to samples taken after fasting for at least 8 hours.  BUN 22 16 16 12  R 8 R 11 19 R  Creatinine, Ser 1.42 High  1.62 High  1.11 High  1.24 High  R 1.15 High  R 1.20 High  1.12 High  R  Calcium 9.8 10.4 High  10.4 High  10.2 R 10.2 R 9.2 10.7 High  R  GFR, Estimated 38 Low  32 Low  CM    46 Low  CM        PMH, PSH, SH reviewed HTN, scleroderma, aortic atherosclerosis, hyperlipidemia, and R breast DCIS.   ROS:    PHYSICAL EXAM:  LMP 02/16/2003       ASSESSMENT/PLAN:   Needs u/a (dx LBP, foamy urine) Suspect proteinuria  Ensure no NSAIDs taken

## 2023-12-29 ENCOUNTER — Ambulatory Visit (INDEPENDENT_AMBULATORY_CARE_PROVIDER_SITE_OTHER): Admitting: Family Medicine

## 2023-12-29 ENCOUNTER — Encounter: Payer: Self-pay | Admitting: Family Medicine

## 2023-12-29 VITALS — BP 122/72 | HR 68 | Temp 97.9°F | Ht 66.0 in | Wt 172.6 lb

## 2023-12-29 DIAGNOSIS — M545 Low back pain, unspecified: Secondary | ICD-10-CM

## 2023-12-29 DIAGNOSIS — I10 Essential (primary) hypertension: Secondary | ICD-10-CM

## 2023-12-29 DIAGNOSIS — N1832 Chronic kidney disease, stage 3b: Secondary | ICD-10-CM

## 2023-12-29 DIAGNOSIS — R82998 Other abnormal findings in urine: Secondary | ICD-10-CM

## 2023-12-29 DIAGNOSIS — R801 Persistent proteinuria, unspecified: Secondary | ICD-10-CM | POA: Diagnosis not present

## 2023-12-29 LAB — URINALYSIS, MICROSCOPIC ONLY
Bacteria, UA: NONE SEEN
Casts: NONE SEEN /LPF
RBC, Urine: NONE SEEN /HPF (ref 0–2)

## 2023-12-29 LAB — POCT URINALYSIS DIP (PROADVANTAGE DEVICE)
Bilirubin, UA: NEGATIVE
Glucose, UA: NEGATIVE mg/dL
Ketones, POC UA: NEGATIVE mg/dL
Nitrite, UA: NEGATIVE
Specific Gravity, Urine: 1.01
Urobilinogen, Ur: 0.2
pH, UA: 6 (ref 5.0–8.0)

## 2023-12-29 NOTE — Patient Instructions (Signed)
 Try heat and/or ice for your back (whichever works the best) You can try topical medications such as SalonPas patches with lidocaine, or Biofreeze. Use tylenol as needed for pain. Do some stretches and core strengthening. Contact us for a referral to physical therapy if your pain isn't improving. If you start having any radiation of your pain into the leg (ie sciatica) or numbness or weakness in the leg, please contact us (we may prescribe you prednisone, might want to re-evaluate you).  We discussed getting x-rays of your back.  The orders have been entered, so if your pain isn't improving in the next week or so, please go to [facility] for x-rays.  Be sure to eat a high fiber diet, and continue metamucil, to help bulk up the bowels and minimize leakage. Doing exercises might also help strengthen the sphincter muscles.

## 2024-01-19 DIAGNOSIS — H2013 Chronic iridocyclitis, bilateral: Secondary | ICD-10-CM | POA: Diagnosis not present

## 2024-01-19 DIAGNOSIS — H40043 Steroid responder, bilateral: Secondary | ICD-10-CM | POA: Diagnosis not present

## 2024-01-19 DIAGNOSIS — H40053 Ocular hypertension, bilateral: Secondary | ICD-10-CM | POA: Diagnosis not present

## 2024-01-19 DIAGNOSIS — H47011 Ischemic optic neuropathy, right eye: Secondary | ICD-10-CM | POA: Diagnosis not present

## 2024-01-19 DIAGNOSIS — H04123 Dry eye syndrome of bilateral lacrimal glands: Secondary | ICD-10-CM | POA: Diagnosis not present

## 2024-01-19 DIAGNOSIS — H40023 Open angle with borderline findings, high risk, bilateral: Secondary | ICD-10-CM | POA: Diagnosis not present

## 2024-01-19 LAB — HM DIABETES EYE EXAM

## 2024-01-23 DIAGNOSIS — Z79631 Long term (current) use of antimetabolite agent: Secondary | ICD-10-CM | POA: Diagnosis not present

## 2024-01-23 DIAGNOSIS — H209 Unspecified iridocyclitis: Secondary | ICD-10-CM | POA: Diagnosis not present

## 2024-01-23 DIAGNOSIS — L940 Localized scleroderma [morphea]: Secondary | ICD-10-CM | POA: Diagnosis not present

## 2024-02-09 NOTE — Progress Notes (Signed)
 Office Visit Note  Patient: Rhonda Buchanan  CLYDIA NIEVES             Date of Birth: 10/10/1946           MRN: [id number]             PCP: Watson Hacking, MD Referring: Watson Hacking, MD Visit Date: 02/21/2024 Occupation: @GUAROCC @  Subjective:  Medication management  History of Present Illness: Rhonda  NASRIN Buchanan is a 78 y.o. 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  She returns today after her last visit on August 22, 2023.  She denies any increased skin tightness, telangiectasia or morphea lesions.  She has not had any flares of 5 iridocyclitis.  She continues to have dry eyes.  She denies any shortness of breath or cough.  She is followed by cardiology.  She denies any increasing stiffness in her hands knees or feet.  She is on methotrexate 5 tablets p.o. weekly along with folic acid  prescribed by [facility] dermatology.  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  Patient states she had no chemotherapy or radiation therapy.  She has been on anastrozole .    Activities of Daily Living:  Patient reports morning stiffness for few minutes.   Patient Denies nocturnal pain.  Difficulty dressing/grooming: Denies Difficulty climbing stairs: Reports Difficulty getting out of chair: Reports Difficulty using hands for taps, buttons, cutlery, and/or writing: Reports  Review of Systems  Constitutional:  Positive for fatigue.  HENT:  Negative for mouth sores and mouth dryness.   Eyes:  Positive for dryness.  Respiratory:  Negative for shortness of breath.   Cardiovascular:  Negative for chest pain and palpitations.  Gastrointestinal:  Negative for blood in stool, constipation and diarrhea.  Endocrine: Negative for increased urination.  Genitourinary:  Negative for involuntary urination.  Musculoskeletal:  Positive for gait problem and morning stiffness. Negative for joint pain, joint pain, joint swelling, myalgias, muscle weakness, muscle tenderness and myalgias.  Skin:   Positive for hair loss. Negative for color change, rash and sensitivity to sunlight.  Allergic/Immunologic: Negative for susceptible to infections.  Neurological:  Negative for dizziness and headaches.  Hematological:  Negative for swollen glands.  Psychiatric/Behavioral:  Negative for depressed mood and sleep disturbance. The patient is not nervous/anxious.     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   Atherosclerosis of coronary artery of native heart without angina pectoris 10/05/2016   Abnormal PFTs 05/21/2016   Asthma due to environmental allergies 06/24/2015   Positive PPD, treated 06/05/2015   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/14/2014   Scleroderma (HCC) 07/12/2013   Hypertension 03/04/2011   Hyperlipidemia 03/04/2011   Allergic rhinitis 03/04/2011   Arthritis 03/04/2011   Migraine variant 04/18/1995    Past Medical History:  Diagnosis Date   Allergy    Arthritis    Asthma    Breast cancer (HCC)    Hypercholesterolemia    Hypertension    Peripheral arterial disease (HCC)    PONV (postoperative nausea and vomiting)    PUD (peptic ulcer disease)    Scleroderma (HCC)  Family History  Problem Relation Age of Onset   Cancer Father 46 - 30       unknown type   Ulcers Sister    High Cholesterol Sister    Ulcers Brother    Heart disease Brother 57       MI   Prostate cancer Maternal Uncle    Breast cancer Maternal Grandmother 59 - 69   Heart disease Maternal Grandfather    Cancer Cousin        unknown types, maternal first cousins   Past Surgical History:   Procedure Laterality Date   arthroscopy     BREAST BIOPSY Right 09/07/2023   Ductal carcinoma in situ, intermediate grade; Calcifications present; 0.1 cm DCIS length.   BREAST LUMPECTOMY WITH RADIOACTIVE SEED LOCALIZATION Right 10/21/2023   Procedure: RIGHT BREAST SEED GUIDED LUMPECTOMY;  Surgeon: Enid Harry, MD;  Location: [facility] OR;  Service: General;  Laterality: Right;   CYST REMOVAL HAND     EYE SURGERY Left 10/25/2017   laser surgery    KNEE ARTHROPLASTY     Social History   Social History Narrative   Not on file   Immunization History  Administered Date(s) Administered   Fluad Quad(high Dose 65+) 12/19/2020, 09/29/2021, 08/15/2022   H1N1 08/22/2009   Influenza, High Dose Seasonal PF 06/24/2015, 10/05/2016, 08/04/2018   Influenza,inj,Quad PF,6+ Mos 07/12/2013, 01/15/2015   Influenza-Unspecified 06/24/2015, 10/05/2016, 12/24/2017, 08/04/2018   PFIZER(Purple Top)SARS-COV-2 Vaccination 12/13/2019, 01/08/2020, 10/03/2020   Pfizer Covid-19 Vaccine Bivalent Booster 16yrs & up 09/29/2021   Pfizer(Comirnaty)Fall Seasonal Vaccine 12 years and older 08/15/2022   Pneumococcal Conjugate-13 05/20/2014   Pneumococcal Polysaccharide-23 01/31/2007   Tdap 01/31/2007, 12/19/2020   Zoster, Live 07/02/2008     Objective: Vital Signs: BP 102/63 (BP Location: Left Arm, Patient Position: Sitting, Cuff Size: Normal)   Pulse 67   Resp 16   Ht 5' 5.5" (1.664 m)   Wt 174 lb (78.9 kg)   LMP 02/16/2003   BMI 28.51 kg/m    Physical Exam Vitals and nursing note reviewed.  Constitutional:      Appearance: She is well-developed.  HENT:     Head: Normocephalic and atraumatic.  Eyes:     Conjunctiva/sclera: Conjunctivae normal.  Cardiovascular:     Rate and Rhythm: Normal rate and regular rhythm.     Heart sounds: Normal heart sounds.  Pulmonary:     Effort: Pulmonary effort is normal.     Breath sounds: Normal breath sounds.  Abdominal:     General: Bowel sounds are normal.      Palpations: Abdomen is soft.  Musculoskeletal:     Cervical back: Normal range of motion.  Lymphadenopathy:     Cervical: No cervical adenopathy.  Skin:    General: Skin is warm and dry.     Capillary Refill: Capillary refill takes less than 2 seconds.  Neurological:     Mental Status: She is alert and oriented to person, place, and time.  Psychiatric:        Behavior: Behavior normal.      Musculoskeletal Exam: Cervical, thoracic and lumbar spine were in good range of motion.  Shoulders, elbows, wrist joints, MCPs PIPs and DIPs were in good range of motion with no synovitis.  Hip joints were in good range of motion.  She had limited extension of bilateral knee joints with valgus deformity.  Ankles were in good range of motion.  There was no tenderness over ankles or MTPs.  CDAI Exam: CDAI Score: --  Patient Global: --; Provider Global: -- Swollen: --; Tender: -- Joint Exam 02/21/2024   No joint exam has been documented for this visit   There is currently no information documented on the homunculus. Go to the Rheumatology activity and complete the homunculus joint exam.  Investigation: No additional findings.  Imaging: No results found.  Recent Labs: Lab Results  Component Value Date   WBC 5.0 09/21/2023   HGB 13.5 09/21/2023   PLT 164 09/21/2023   NA 137 12/21/2023   K 3.5 12/21/2023   CL 102 12/21/2023   CO2 25 12/21/2023   GLUCOSE 111 (H) 12/21/2023   BUN 22 12/21/2023   CREATININE 1.42 (H) 12/21/2023   BILITOT 0.6 09/21/2023   ALKPHOS 70 09/21/2023   AST 24 09/21/2023   ALT 21 09/21/2023   PROT 7.2 09/21/2023   ALBUMIN 4.2 09/21/2023   CALCIUM  9.8 12/21/2023   GFRAA 62 08/01/2020   September 21, 2023 CBC was normal.  CMP showed elevated creatinine at 1.11 and calcium  at 10.4. December 21, 2023 creatinine 1.42, calcium  9.8.  Speciality Comments: No specialty comments available.  Procedures:  No procedures performed Allergies: Aspirin , Ibuprofen, and Oxycodone    Assessment / Plan:     Visit Diagnoses: Scleroderma (HCC) - History of morphea, Raynauds, recurrent iridocyclitis.  She is followed by Dr. Jann Melody, [facility] dermatology for morphea.  Patient states that she was off methotrexate for lumpectomy and then she resumed methotrexate at 5 tablets p.o. weekly.  She was previously taking 4 tablets p.o. weekly.  I advised her to call dermatology office to discuss methotrexate dosing.  Her most recent creatinine was elevated.  She had no sclerodactyly, nailbed capillary changes or telangiectasia on the examination.  She denies any flares of iridocyclitis.  She sees cardiologist on a regular basis.  She denies any shortness of breath or palpitations.  I will refer her to pulmonologist for baseline evaluation.    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 Jann Melody.  She denies any active lesions.  She had hyperpigmented lesions on her abdomen and back.  High risk medication use - Methotrexate 5 tablets by mouth once weekly and folic acid  2 mg p.o. daily.  Patient increase the dose of methotrexate to 5 tablets p.o. weekly herself.  I advised her to reduce the dose to 4 tablets p.o. weekly and confirm with her dermatologist.  Her creatinine has been elevated.  Patient states she will have repeat labs by them in the couple of weeks.  Positive PPD, treated  Raynaud's syndrome without gangrene-currently not active.  She had no sclerodactyly, digital ulcers, nailbed capillary changes or telangiectasia.  Recurrent acute iridocyclitis of both eyes - Evaluated by Dr. Mason Sole.  Patient denies having any iritis flare.  Primary osteoarthritis of both hands-she had mild PIP and DIP thickening but no synovitis was noted.  Primary osteoarthritis of both knees -she has severe osteoarthritis of her knee joints with bilateral valgus deformities and limited extension.  Patient plans to have total knee replacement by Dr. Charol Copas.  Primary osteoarthritis of both feet-she denied  any discomfort today.  Bilateral calcaneal spurs  Ductal carcinoma in situ (DCIS) of right breast - 11/24-status post lumpectomy.  According to patient no chemotherapy or radiation therapy was required.  She is on anastrozole .  History of hyperlipidemia  History of hypertension-blood pressure was normal at 102/63.  Monitoring blood pressure at home was advised.  Increased risk of renal hypertensive crisis also did with scleroderma was discussed.  Avoidance  of steroids was discussed.  History of migraine  Orders: Orders Placed This Encounter  Procedures   Ambulatory referral to Pulmonology   No orders of the defined types were placed in this encounter.    Follow-Up Instructions: Return in about 6 months (around 08/23/2024) for Scleroderma, osteoarthritis.   Nicholas Bari, MD  Note - This record has been created using Animal nutritionist.  Chart creation errors have been sought, but may not always  have been located. Such creation errors do not reflect on  the standard of medical care.

## 2024-02-17 ENCOUNTER — Other Ambulatory Visit: Payer: Self-pay | Admitting: Family Medicine

## 2024-02-17 DIAGNOSIS — I10 Essential (primary) hypertension: Secondary | ICD-10-CM

## 2024-02-20 NOTE — Telephone Encounter (Signed)
 06/22/2024 is her next visit

## 2024-02-21 ENCOUNTER — Ambulatory Visit: Payer: Medicare Other | Attending: Rheumatology | Admitting: Rheumatology

## 2024-02-21 ENCOUNTER — Encounter: Payer: Self-pay | Admitting: Rheumatology

## 2024-02-21 VITALS — BP 102/63 | HR 67 | Resp 16 | Ht 65.5 in | Wt 174.0 lb

## 2024-02-21 DIAGNOSIS — Z8679 Personal history of other diseases of the circulatory system: Secondary | ICD-10-CM

## 2024-02-21 DIAGNOSIS — H20023 Recurrent acute iridocyclitis, bilateral: Secondary | ICD-10-CM

## 2024-02-21 DIAGNOSIS — Z8639 Personal history of other endocrine, nutritional and metabolic disease: Secondary | ICD-10-CM | POA: Diagnosis not present

## 2024-02-21 DIAGNOSIS — R7611 Nonspecific reaction to tuberculin skin test without active tuberculosis: Secondary | ICD-10-CM | POA: Diagnosis not present

## 2024-02-21 DIAGNOSIS — I7300 Raynaud's syndrome without gangrene: Secondary | ICD-10-CM | POA: Diagnosis not present

## 2024-02-21 DIAGNOSIS — M7731 Calcaneal spur, right foot: Secondary | ICD-10-CM

## 2024-02-21 DIAGNOSIS — L940 Localized scleroderma [morphea]: Secondary | ICD-10-CM

## 2024-02-21 DIAGNOSIS — M349 Systemic sclerosis, unspecified: Secondary | ICD-10-CM | POA: Diagnosis not present

## 2024-02-21 DIAGNOSIS — M19072 Primary osteoarthritis, left ankle and foot: Secondary | ICD-10-CM

## 2024-02-21 DIAGNOSIS — M19071 Primary osteoarthritis, right ankle and foot: Secondary | ICD-10-CM | POA: Diagnosis not present

## 2024-02-21 DIAGNOSIS — M7732 Calcaneal spur, left foot: Secondary | ICD-10-CM

## 2024-02-21 DIAGNOSIS — M19041 Primary osteoarthritis, right hand: Secondary | ICD-10-CM | POA: Diagnosis not present

## 2024-02-21 DIAGNOSIS — Z79899 Other long term (current) drug therapy: Secondary | ICD-10-CM | POA: Diagnosis not present

## 2024-02-21 DIAGNOSIS — D0511 Intraductal carcinoma in situ of right breast: Secondary | ICD-10-CM | POA: Diagnosis not present

## 2024-02-21 DIAGNOSIS — M170 Bilateral primary osteoarthritis of knee: Secondary | ICD-10-CM

## 2024-02-21 DIAGNOSIS — Z8669 Personal history of other diseases of the nervous system and sense organs: Secondary | ICD-10-CM

## 2024-02-21 DIAGNOSIS — M19042 Primary osteoarthritis, left hand: Secondary | ICD-10-CM

## 2024-02-21 NOTE — Patient Instructions (Signed)
 Please discuss elevated creatinine with your dermatologist.

## 2024-03-13 ENCOUNTER — Other Ambulatory Visit: Payer: Self-pay | Admitting: *Deleted

## 2024-03-13 DIAGNOSIS — D0511 Intraductal carcinoma in situ of right breast: Secondary | ICD-10-CM

## 2024-03-14 ENCOUNTER — Inpatient Hospital Stay: Payer: Medicare Other

## 2024-03-14 ENCOUNTER — Telehealth: Payer: Self-pay

## 2024-03-14 ENCOUNTER — Inpatient Hospital Stay: Payer: Medicare Other | Attending: Adult Health | Admitting: Adult Health

## 2024-03-14 ENCOUNTER — Encounter: Payer: Self-pay | Admitting: Adult Health

## 2024-03-14 VITALS — BP 157/56 | HR 57 | Temp 97.9°F | Resp 17 | Wt 173.0 lb

## 2024-03-14 DIAGNOSIS — Z79811 Long term (current) use of aromatase inhibitors: Secondary | ICD-10-CM | POA: Diagnosis not present

## 2024-03-14 DIAGNOSIS — Z809 Family history of malignant neoplasm, unspecified: Secondary | ICD-10-CM | POA: Insufficient documentation

## 2024-03-14 DIAGNOSIS — Z170 Estrogen receptor positive status [ER+]: Secondary | ICD-10-CM | POA: Diagnosis not present

## 2024-03-14 DIAGNOSIS — Z803 Family history of malignant neoplasm of breast: Secondary | ICD-10-CM | POA: Diagnosis not present

## 2024-03-14 DIAGNOSIS — D0511 Intraductal carcinoma in situ of right breast: Secondary | ICD-10-CM | POA: Diagnosis not present

## 2024-03-14 LAB — CBC WITH DIFFERENTIAL (CANCER CENTER ONLY)
Abs Immature Granulocytes: 0 10*3/uL (ref 0.00–0.07)
Basophils Absolute: 0 10*3/uL (ref 0.0–0.1)
Basophils Relative: 1 %
Eosinophils Absolute: 0.2 10*3/uL (ref 0.0–0.5)
Eosinophils Relative: 4 %
HCT: 37.8 % (ref 36.0–46.0)
Hemoglobin: 13 g/dL (ref 12.0–15.0)
Immature Granulocytes: 0 %
Lymphocytes Relative: 34 %
Lymphs Abs: 1.3 10*3/uL (ref 0.7–4.0)
MCH: 32.7 pg (ref 26.0–34.0)
MCHC: 34.4 g/dL (ref 30.0–36.0)
MCV: 95 fL (ref 80.0–100.0)
Monocytes Absolute: 0.5 10*3/uL (ref 0.1–1.0)
Monocytes Relative: 14 %
Neutro Abs: 1.9 10*3/uL (ref 1.7–7.7)
Neutrophils Relative %: 47 %
Platelet Count: 191 10*3/uL (ref 150–400)
RBC: 3.98 MIL/uL (ref 3.87–5.11)
RDW: 14.5 % (ref 11.5–15.5)
WBC Count: 3.9 10*3/uL — ABNORMAL LOW (ref 4.0–10.5)
nRBC: 0 % (ref 0.0–0.2)

## 2024-03-14 LAB — CMP (CANCER CENTER ONLY)
ALT: 29 U/L (ref 0–44)
AST: 32 U/L (ref 15–41)
Albumin: 4 g/dL (ref 3.5–5.0)
Alkaline Phosphatase: 98 U/L (ref 38–126)
Anion gap: 7 (ref 5–15)
BUN: 16 mg/dL (ref 8–23)
CO2: 28 mmol/L (ref 22–32)
Calcium: 10 mg/dL (ref 8.9–10.3)
Chloride: 104 mmol/L (ref 98–111)
Creatinine: 1.29 mg/dL — ABNORMAL HIGH (ref 0.44–1.00)
GFR, Estimated: 42 mL/min — ABNORMAL LOW (ref >60)
Glucose, Bld: 94 mg/dL (ref 70–99)
Potassium: 3.4 mmol/L — ABNORMAL LOW (ref 3.5–5.1)
Sodium: 139 mmol/L (ref 135–145)
Total Bilirubin: 0.7 mg/dL (ref 0.0–1.2)
Total Protein: 7.2 g/dL (ref 6.5–8.1)

## 2024-03-14 NOTE — Progress Notes (Signed)
 SURVIVORSHIP VISIT:  BRIEF ONCOLOGIC HISTORY:  Oncology History  Ductal carcinoma in situ (DCIS) of right breast  08/04/2023 Mammogram   She had a screening mammogram in October 2024 which showed indeterminate right breast calcs, diagnostic mammogram was recommended, diagnostic mammogram also showed grouped linear pleomorphic calcs in the right breast suspicious of malignancy.  Stereotactic biopsy was recommended.   09/07/2023 Pathology Results   Right breast needle core biopsy at 12:00 middle depth showed intermediate grade DCIS, prognostic showed 90% positive ER strong staining, 40% positive PR weak staining   09/20/2023 Initial Diagnosis   Ductal carcinoma in situ (DCIS) of right breast   09/21/2023 Cancer Staging   Staging form: Breast, AJCC 8th Edition - Clinical stage from 09/21/2023: Stage 0 (cTis (DCIS), cN0, cM0, ER+, PR+) - Signed by Murleen Arms, MD on 09/21/2023 Stage prefix: Initial diagnosis Nuclear grade: G2    Genetic Testing   [facility] CancerNext-Expanded Panel+RNA was Negative. Report date is 10/10/2023.   The CancerNext-Expanded gene panel offered by [facility] and includes sequencing, rearrangement, and RNA analysis for the following 76 genes: AIP, ALK, APC, ATM, AXIN2, BAP1, BARD1, BMPR1A, BRCA1, BRCA2, BRIP1, CDC73, CDH1, CDK4, CDKN1B, CDKN2A, CEBPA, CHEK2, CTNNA1, DDX41, DICER1, ETV6, FH, FLCN, GATA2, LZTR1, MAX, MBD4, MEN1, MET, MLH1, MSH2, MSH3, MSH6, MUTYH, NF1, NF2, NTHL1, PALB2, PHOX2B, PMS2, POT1, PRKAR1A, PTCH1, PTEN, RAD51C, RAD51D, RB1, RET, RUNX1, SDHA, SDHAF2, SDHB, SDHC, SDHD, SMAD4, SMARCA4, SMARCB1, SMARCE1, STK11, SUFU, TMEM127, TP53, TSC1, TSC2, VHL, and WT1 (sequencing and deletion/duplication); EGFR, HOXB13, KIT, MITF, PDGFRA, POLD1, and POLE (sequencing only); EPCAM and GREM1 (deletion/duplication only).     11/2023 -  Anti-estrogen oral therapy   Anastrozole      INTERVAL HISTORY:  Ms. Geng to review her survivorship care plan detailing her  treatment course for breast cancer, as well as monitoring long-term side effects of that treatment, education regarding health maintenance, screening, and overall wellness and health promotion.     Overall, Ms. Tigert reports feeling quite well.  She is taking anastrozole  daily with good tolerance.  REVIEW OF SYSTEMS:  Review of Systems  Constitutional:  Negative for appetite change, chills, fatigue, fever and unexpected weight change.  HENT:   Negative for hearing loss, lump/mass and trouble swallowing.   Eyes:  Negative for eye problems and icterus.  Respiratory:  Negative for chest tightness, cough and shortness of breath.   Cardiovascular:  Negative for chest pain, leg swelling and palpitations.  Gastrointestinal:  Negative for abdominal distention, abdominal pain, constipation, diarrhea, nausea and vomiting.  Endocrine: Negative for hot flashes.  Genitourinary:  Negative for difficulty urinating.   Musculoskeletal:  Negative for arthralgias.  Skin:  Negative for itching and rash.  Neurological:  Negative for dizziness, extremity weakness, headaches and numbness.  Hematological:  Negative for adenopathy. Does not bruise/bleed easily.  Psychiatric/Behavioral:  Negative for depression. The patient is not nervous/anxious.   Breast: Denies any new nodularity, masses, tenderness, nipple changes, or nipple discharge.       PAST MEDICAL/SURGICAL HISTORY:  Past Medical History:  Diagnosis Date   Allergy    Arthritis    Asthma    Breast cancer (HCC)    Hypercholesterolemia    Hypertension    Peripheral arterial disease (HCC)    PONV (postoperative nausea and vomiting)    PUD (peptic ulcer disease)    Scleroderma (HCC)    Past Surgical History:  Procedure Laterality Date   arthroscopy     BREAST BIOPSY Right 09/07/2023   Ductal carcinoma in  situ, intermediate grade; Calcifications present; 0.1 cm DCIS length.   BREAST LUMPECTOMY WITH RADIOACTIVE SEED LOCALIZATION Right 10/21/2023    Procedure: RIGHT BREAST SEED GUIDED LUMPECTOMY;  Surgeon: Enid Harry, MD;  Location: [facility] OR;  Service: General;  Laterality: Right;   CYST REMOVAL HAND     EYE SURGERY Left 10/25/2017   laser surgery    KNEE ARTHROPLASTY       ALLERGIES:  Allergies  Allergen Reactions   Aspirin  Nausea Only   Ibuprofen Nausea Only    GI upset   Oxycodone Other (See Comments)    Gi- upset     CURRENT MEDICATIONS:  Outpatient Encounter Medications as of 03/14/2024  Medication Sig   anastrozole  (ARIMIDEX ) 1 MG tablet Take 1 tablet (1 mg total) by mouth daily.   Brinzolamide-Brimonidine 1-0.2 % SUSP Place 1 drop into both eyes 3 (three) times daily.   folic acid  (FOLVITE ) 1 MG tablet Take 3 mg by mouth daily. Hold on the day when  takes Methotrexate   lisinopril -hydrochlorothiazide  (ZESTORETIC ) 20-12.5 MG tablet Take 1 tablet by mouth daily.   methotrexate (RHEUMATREX) 2.5 MG tablet Take 15 mg by mouth once a week.  Caution:Chemotherapy. Protect from light.   metoprolol  succinate (TOPROL -XL) 50 MG 24 hr tablet TAKE 1/2 TABLET BY MOUTH DAILY. TAKE WITH OR IMMEDIATELY FOLLOWING A MEAL.   Polyethyl Glycol-Propyl Glycol (SYSTANE OP) Apply to eye.   potassium chloride  (KLOR-CON ) 10 MEQ tablet TAKE 1 TABLET BY MOUTH TWICE A DAY   prednisoLONE acetate (PRED FORTE) 1 % ophthalmic suspension Place 1 drop into both eyes daily.    rosuvastatin  (CRESTOR ) 40 MG tablet Take 1 tablet (40 mg total) by mouth daily.   timolol (TIMOPTIC) 0.5 % ophthalmic solution Place 1 drop into both eyes every morning.   [DISCONTINUED] lisinopril -hydrochlorothiazide  (ZESTORETIC ) 10-12.5 MG tablet TAKE 1 TABLET BY MOUTH EVERY DAY (Patient not taking: Reported on 02/21/2024)   No facility-administered encounter medications on file as of 03/14/2024.     ONCOLOGIC FAMILY HISTORY:  Family History  Problem Relation Age of Onset   Cancer Father 56 - 51       unknown type   Ulcers Sister    High Cholesterol Sister    Ulcers  Brother    Heart disease Brother 104       MI   Prostate cancer Maternal Uncle    Breast cancer Maternal Grandmother 29 - 69   Heart disease Maternal Grandfather    Cancer Cousin        unknown types, maternal first cousins     SOCIAL HISTORY:  Social History   Socioeconomic History   Marital status: Divorced    Spouse name: Not on file   Number of children: Not on file   Years of education: Not on file   Highest education level: Not on file  Occupational History   Not on file  Tobacco Use   Smoking status: Never    Passive exposure: Never   Smokeless tobacco: Never  Vaping Use   Vaping status: Never Used  Substance and Sexual Activity   Alcohol use: No   Drug use: No   Sexual activity: Not Currently  Other Topics Concern   Not on file  Social History Narrative   Not on file   Social Drivers of Health   Financial Resource Strain: Low Risk  (04/04/2023)   Overall Financial Resource Strain (CARDIA)    Difficulty of Paying Living Expenses: Not hard at all  Food Insecurity: No  Food Insecurity (09/21/2023)   Hunger Vital Sign    Worried About Running Out of Food in the Last Year: Never true    Ran Out of Food in the Last Year: Never true  Transportation Needs: No Transportation Needs (09/21/2023)   PRAPARE - Administrator, Civil Service (Medical): No    Lack of Transportation (Non-Medical): No  Physical Activity: Insufficiently Active (04/04/2023)   Exercise Vital Sign    Days of Exercise per Week: 3 days    Minutes of Exercise per Session: 20 min  Stress: No Stress Concern Present (04/04/2023)   [employer] of Occupational Health - Occupational Stress Questionnaire    Feeling of Stress : Not at all  Social Connections: Unknown (04/04/2023)   Social Connection and Isolation Panel [NHANES]    Frequency of Communication with Friends and Family: Twice a week    Frequency of Social Gatherings with Friends and Family: Once a week    Attends Religious  Services: Not on Marketing executive or Organizations: No    Attends Banker Meetings: Not on file    Marital Status: Divorced  Intimate Partner Violence: Patient Declined (09/21/2023)   Humiliation, Afraid, Rape, and Kick questionnaire    Fear of Current or Ex-Partner: Patient declined    Emotionally Abused: Patient declined    Physically Abused: Patient declined    Sexually Abused: Patient declined     OBSERVATIONS/OBJECTIVE:  BP (!) 157/56 (Patient Position: Sitting)   Pulse (!) 57   Temp 97.9 F (36.6 C) (Temporal)   Resp 17   Wt 173 lb (78.5 kg)   LMP 02/16/2003   SpO2 100%   BMI 28.35 kg/m  GENERAL: Patient is a well appearing female in no acute distress HEENT:  Sclerae anicteric.  Oropharynx clear and moist. No ulcerations or evidence of oropharyngeal candidiasis. Neck is supple.  NODES:  No cervical, supraclavicular, or axillary lymphadenopathy palpated.  BREAST EXAM: right breast s/p lumpectomy, no sign of local recurrence, left breast is benign LUNGS:  Clear to auscultation bilaterally.  No wheezes or rhonchi. HEART:  Regular rate and rhythm. No murmur appreciated. ABDOMEN:  Soft, nontender.  Positive, normoactive bowel sounds. No organomegaly palpated. MSK:  No focal spinal tenderness to palpation. Full range of motion bilaterally in the upper extremities. EXTREMITIES:  No peripheral edema.   SKIN:  Clear with no obvious rashes or skin changes. No nail dyscrasia. NEURO:  Nonfocal. Well oriented.  Appropriate affect.   LABORATORY DATA:  None for this visit.  DIAGNOSTIC IMAGING:  None for this visit.      ASSESSMENT AND PLAN:  Ms.. Vanderloop is a pleasant 78 y.o. female with Stage 0 right breast DCIS, ER+/PR+, diagnosed in 07/2023, treated with lumpectomy and anti-estrogen therapy with Anastrozole  beginning in 11/2023.  She presents to the Survivorship Clinic for our initial meeting and routine follow-up post-completion of treatment for  breast cancer.    1. Stage 0 right breast cancer:  Ms. Padron is continuing to recover from definitive treatment for breast cancer. She will follow-up with her medical oncologist, Dr.  Arno Bibles in 6 months with history and physical exam per surveillance protocol.  She will continue her anti-estrogen therapy with Anastrozole . Thus far, she is tolerating the Anastrozole  well, with minimal side effects. Her mammogram is due 07/2024; orders placed today.   Today, a comprehensive survivorship care plan and treatment summary was reviewed with the patient today detailing her breast cancer diagnosis, treatment  course, potential late/long-term effects of treatment, appropriate follow-up care with recommendations for the future, and patient education resources.  A copy of this summary, along with a letter will be sent to the patient's primary care provider via mail/fax/In Basket message after today's visit.    2. Bone health:  Given Ms. Errington's age/history of breast cancer and her current treatment regimen including anti-estrogen therapy with Anastrozole , she is at risk for bone demineralization.  Her last DEXA scan was 11/29/2023 and showed osteopenia with a t score of -1.1.  Repeat is recommended every 2 years, next due 11/2025.  She was given education on specific activities to promote bone health.  3. Cancer screening:  Due to Ms. Sampedro's history and her age, she should receive screening for skin cancers, colon cancer, and gynecologic cancers.  The information and recommendations are listed on the patient's comprehensive care plan/treatment summary and were reviewed in detail with the patient.    4. Health maintenance and wellness promotion: Ms. Hamil was encouraged to consume 5-7 servings of fruits and vegetables per day. We reviewed the "Nutrition Rainbow" handout.  She was also encouraged to engage in moderate to vigorous exercise for 30 minutes per day most days of the week.  She was instructed to limit her  alcohol consumption and continue to abstain from tobacco use.     5. Support services/counseling: It is not uncommon for this period of the patient's cancer care trajectory to be one of many emotions and stressors.   She was given information regarding our available services and encouraged to contact me with any questions or for help enrolling in any of our support group/programs.    Follow up instructions:    -Return to cancer center in 6 months for f/u with Dr. Arno Bibles  -Mammogram due in 07/2024 -DEXA 11/2025 -She is welcome to return back to the Survivorship Clinic at any time; no additional follow-up needed at this time.  -Consider referral back to survivorship as a long-term survivor for continued surveillance  The patient was provided an opportunity to ask questions and all were answered. The patient agreed with the plan and demonstrated an understanding of the instructions.   Total encounter time:30 minutes*in face-to-face visit time, chart review, lab review, care coordination, order entry, and documentation of the encounter time.    Alwin Baars, NP 03/14/24 11:08 AM Medical Oncology and Hematology [facility] [address] [place], [place] [address] Tel. [phone]    Fax. [phone]  *Total Encounter Time as defined by the Centers for Medicare and Medicaid Services includes, in addition to the face-to-face time of a patient visit (documented in the note above) non-face-to-face time: obtaining and reviewing outside history, ordering and reviewing medications, tests or procedures, care coordination (communications with other health care professionals or caregivers) and documentation in the medical record.

## 2024-03-14 NOTE — Telephone Encounter (Signed)
 Order for dx MM faxed to [facility]. Fax confirmation received.

## 2024-03-23 ENCOUNTER — Ambulatory Visit

## 2024-03-23 DIAGNOSIS — Z0000 Encounter for general adult medical examination without abnormal findings: Secondary | ICD-10-CM

## 2024-03-23 NOTE — Patient Instructions (Signed)
 Ms. Reddy , Thank you for taking time out of your busy schedule to complete your Annual Wellness Visit with me. I enjoyed our conversation and look forward to speaking with you again next year. I, as well as your care team,  appreciate your ongoing commitment to your health goals. Please review the following plan we discussed and let me know if I can assist you in the future. Your Game plan/ To Do List    Referrals: If you haven't heard from the office you've been referred to, please reach out to them at the phone provided.  N/a Follow up Visits: Next Medicare AWV with our clinical staff: 04/02/2025 at 2:10   Have you seen your provider in the last 6 months (3 months if uncontrolled diabetes)? Yes Next Office Visit with your provider: 06/19/2024 at 8:15  Clinician Recommendations:  Aim for 30 minutes of exercise or brisk walking, 6-8 glasses of water, and 5 servings of fruits and vegetables each day.       This is a list of the screening recommended for you and due dates:  Health Maintenance  Topic Date Due   Zoster (Shingles) Vaccine (1 of 2) 10/29/1964   COVID-19 Vaccine (6 - 2024-25 season) 06/19/2023   Pneumonia Vaccine (3 of 3 - PPSV23, PCV20 or PCV21) 01/16/2029*   Flu Shot  05/18/2024   Medicare Annual Wellness Visit  03/23/2025   DTaP/Tdap/Td vaccine (3 - Td or Tdap) 12/20/2030   DEXA scan (bone density measurement)  Completed   Hepatitis C Screening  Completed   HPV Vaccine  Aged Out   Meningitis B Vaccine  Aged Out   Colon Cancer Screening  Discontinued   Cologuard (Stool DNA test)  Discontinued  *Topic was postponed. The date shown is not the original due date.    Advanced directives: (ACP Link)Information on Advanced Care Planning can be found at [place]  Secretary of [facility] Advance Health Care Directives Advance Health Care Directives. [email or web address]  Advance Care Planning is important because it:  [x]  Makes sure you receive the medical care that is consistent with your  values, goals, and preferences  [x]  It provides guidance to your family and loved ones and reduces their decisional burden about whether or not they are making the right decisions based on your wishes.  Follow the link provided in your after visit summary or read over the paperwork we have mailed to you to help you started getting your Advance Directives in place. If you need assistance in completing these, please reach out to us  so that we can help you!  See attachments for Preventive Care and Fall Prevention Tips.

## 2024-03-23 NOTE — Progress Notes (Signed)
 Subjective:   Rhonda Buchanan is a 78 y.o. who presents for a Medicare Wellness preventive visit.  As a reminder, Annual Wellness Visits don't include a physical exam, and some assessments may be limited, especially if this visit is performed virtually. We may recommend an in-person follow-up visit with your provider if needed.  Visit Complete: Virtual I connected with  Rhonda Buchanan on 03/23/24 by a audio enabled telemedicine application and verified that I am speaking with the correct person using two identifiers.  Patient Location: Home  Provider Location: Office/Clinic  I discussed the limitations of evaluation and management by telemedicine. The patient expressed understanding and agreed to proceed.  Vital Signs: Because this visit was a virtual/telehealth visit, some criteria may be missing or patient reported. Any vitals not documented were not able to be obtained and vitals that have been documented are patient reported.  VideoError- Librarian, academic were attempted between this provider and patient, however failed, due to patient having technical difficulties OR patient did not have access to video capability.  We continued and completed visit with audio only.   Persons Participating in Visit: Patient.  AWV Questionnaire: Yes: Patient Medicare AWV questionnaire was completed by the patient on 03/23/2024; I have confirmed that all information answered by patient is correct and no changes since this date.  Cardiac Risk Factors include: advanced age (>72men, >65 women);dyslipidemia;hypertension     Objective:     Today's Vitals   There is no height or weight on file to calculate BMI.     03/23/2024    2:22 PM 10/17/2023   12:58 PM 04/05/2023    2:33 PM 03/07/2023    1:08 PM 09/24/2022    9:36 AM 04/22/2022   10:07 AM 06/05/2021    6:29 PM  Advanced Directives  Does Patient Have a Medical Advance Directive? No Yes No No No Yes No  Type of  Advance Directive  Living will    Living will   Does patient want to make changes to medical advance directive?  No - Patient declined    No - Patient declined   Would patient like information on creating a medical advance directive?     No - Patient declined No - Patient declined Yes (ED - send information to MyChart)    Current Medications (verified) Outpatient Encounter Medications as of 03/23/2024  Medication Sig   anastrozole  (ARIMIDEX ) 1 MG tablet Take 1 tablet (1 mg total) by mouth daily.   Brinzolamide-Brimonidine 1-0.2 % SUSP Place 1 drop into both eyes 3 (three) times daily.   folic acid  (FOLVITE ) 1 MG tablet Take 3 mg by mouth daily. Hold on the day when  takes Methotrexate   lisinopril -hydrochlorothiazide  (ZESTORETIC ) 20-12.5 MG tablet Take 1 tablet by mouth daily.   methotrexate (RHEUMATREX) 2.5 MG tablet Take 15 mg by mouth once a week.  Caution:Chemotherapy. Protect from light.   metoprolol  succinate (TOPROL -XL) 50 MG 24 hr tablet TAKE 1/2 TABLET BY MOUTH DAILY. TAKE WITH OR IMMEDIATELY FOLLOWING A MEAL.   Polyethyl Glycol-Propyl Glycol (SYSTANE OP) Apply to eye.   potassium chloride  (KLOR-CON ) 10 MEQ tablet TAKE 1 TABLET BY MOUTH TWICE A DAY   prednisoLONE acetate (PRED FORTE) 1 % ophthalmic suspension Place 1 drop into both eyes daily.    rosuvastatin  (CRESTOR ) 40 MG tablet Take 1 tablet (40 mg total) by mouth daily.   timolol (TIMOPTIC) 0.5 % ophthalmic solution Place 1 drop into both eyes every morning.   No  facility-administered encounter medications on file as of 03/23/2024.    Allergies (verified) Aspirin , Ibuprofen, and Oxycodone   History: Past Medical History:  Diagnosis Date   Allergy    Arthritis    Asthma    Breast cancer (HCC)    Hypercholesterolemia    Hypertension    Peripheral arterial disease (HCC)    PONV (postoperative nausea and vomiting)    PUD (peptic ulcer disease)    Scleroderma (HCC)    Past Surgical History:  Procedure Laterality Date    arthroscopy     BREAST BIOPSY Right 09/07/2023   Ductal carcinoma in situ, intermediate grade; Calcifications present; 0.1 cm DCIS length.   BREAST LUMPECTOMY WITH RADIOACTIVE SEED LOCALIZATION Right 10/21/2023   Procedure: RIGHT BREAST SEED GUIDED LUMPECTOMY;  Surgeon: Enid Harry, MD;  Location: [facility] OR;  Service: General;  Laterality: Right;   CYST REMOVAL HAND     EYE SURGERY Left 10/25/2017   laser surgery    KNEE ARTHROPLASTY     Family History  Problem Relation Age of Onset   Cancer Father 65 - 33       unknown type   Ulcers Sister    High Cholesterol Sister    Ulcers Brother    Heart disease Brother 1       MI   Prostate cancer Maternal Uncle    Breast cancer Maternal Grandmother 88 - 69   Heart disease Maternal Grandfather    Cancer Cousin        unknown types, maternal first cousins   Social History   Socioeconomic History   Marital status: Divorced    Spouse name: Not on file   Number of children: Not on file   Years of education: Not on file   Highest education level: 12th grade  Occupational History   Not on file  Tobacco Use   Smoking status: Never    Passive exposure: Never   Smokeless tobacco: Never  Vaping Use   Vaping status: Never Used  Substance and Sexual Activity   Alcohol use: No   Drug use: No   Sexual activity: Not Currently  Other Topics Concern   Not on file  Social History Narrative   Not on file   Social Drivers of Health   Financial Resource Strain: Low Risk  (03/23/2024)   Overall Financial Resource Strain (CARDIA)    Difficulty of Paying Living Expenses: Not hard at all  Food Insecurity: No Food Insecurity (03/23/2024)   Hunger Vital Sign    Worried About Running Out of Food in the Last Year: Never true    Ran Out of Food in the Last Year: Never true  Transportation Needs: No Transportation Needs (03/23/2024)   PRAPARE - Administrator, Civil Service (Medical): No    Lack of Transportation (Non-Medical): No   Physical Activity: Insufficiently Active (03/23/2024)   Exercise Vital Sign    Days of Exercise per Week: 2 days    Minutes of Exercise per Session: 20 min  Stress: No Stress Concern Present (03/23/2024)   [employer] of Occupational Health - Occupational Stress Questionnaire    Feeling of Stress : Not at all  Social Connections: Socially Isolated (03/23/2024)   Social Connection and Isolation Panel [NHANES]    Frequency of Communication with Friends and Family: Three times a week    Frequency of Social Gatherings with Friends and Family: Once a week    Attends Religious Services: Never    Active Member of  Clubs or Organizations: No    Attends Banker Meetings: Never    Marital Status: Widowed    Tobacco Counseling Counseling given: Not Answered    Clinical Intake:  Pre-visit preparation completed: Yes  Pain : No/denies pain     Nutritional Risks: None Diabetes: No  Lab Results  Component Value Date   HGBA1C 5.4 04/15/2021   HGBA1C 5.4 12/21/2018     How often do you need to have someone help you when you read instructions, pamphlets, or other written materials from your doctor or pharmacy?: 1 - Never  Interpreter Needed?: No  Information entered by :: NAllen LPN   Activities of Daily Living     03/23/2024    2:16 PM 10/17/2023    1:02 PM  In your present state of health, do you have any difficulty performing the following activities:  Hearing? 0   Vision? 0   Difficulty concentrating or making decisions? 0   Walking or climbing stairs? 1   Dressing or bathing? 0   Doing errands, shopping? 0 0  Preparing Food and eating ? N   Using the Toilet? N   In the past six months, have you accidently leaked urine? N   Do you have problems with loss of bowel control? N   Managing your Medications? N   Managing your Finances? N   Housekeeping or managing your Housekeeping? N     Patient Care Team: Watson Hacking, MD as PCP - General (Family  Medicine) Bensimhon, Rheta Celestine, MD as PCP - Cardiology (Cardiology) Enid Harry, MD as Consulting Physician (General Surgery) Iruku, Praveena, MD as Consulting Physician (Hematology and Oncology) Johna Myers, MD as Consulting Physician (Radiation Oncology) Johna Myers, MD as Consulting Physician (Radiation Oncology)  I have updated your Care Teams any recent Medical Services you may have received from other providers in the past year.     Assessment:    This is a routine wellness examination for Rhonda Buchanan .  Hearing/Vision screen Hearing Screening - Comments:: Denies hearing issues Vision Screening - Comments:: Regular eye exams, [facility]   Goals Addressed             This Visit's Progress    Patient Stated       03/23/2024, denies goals       Depression Screen     03/23/2024    2:22 PM 09/21/2023    1:07 PM 04/05/2023    2:34 PM 04/22/2022   10:06 AM 04/15/2021   10:05 AM 04/14/2020    9:55 AM 12/21/2018   10:52 AM  PHQ 2/9 Scores  PHQ - 2 Score 0 0 0 0 0 0 4  PHQ- 9 Score 5  6    13     Fall Risk     03/23/2024    2:16 PM 04/04/2023    8:46 AM 01/04/2023    3:43 PM 04/22/2022   10:05 AM 04/15/2021   10:04 AM  Fall Risk   Falls in the past year? 0 0 0 0 0  Number falls in past yr: 0 0 0 0 0  Injury with Fall? 0 0 0 0 0  Risk for fall due to : Medication side effect Medication side effect No Fall Risks No Fall Risks No Fall Risks  Follow up Falls prevention discussed;Falls evaluation completed Falls prevention discussed;Education provided;Falls evaluation completed Falls evaluation completed Falls evaluation completed Falls evaluation completed    MEDICARE RISK AT HOME:  Medicare Risk at Home  Any stairs in or around the home?: (Patient-Rptd) Yes If so, are there any without handrails?: (Patient-Rptd) No Home free of loose throw rugs in walkways, pet beds, electrical cords, etc?: (Patient-Rptd) Yes Adequate lighting in your home to reduce risk of falls?:  (Patient-Rptd) Yes Life alert?: (Patient-Rptd) No Use of a cane, walker or w/c?: (Patient-Rptd) No Grab bars in the bathroom?: (Patient-Rptd) No Shower chair or bench in shower?: (Patient-Rptd) No Elevated toilet seat or a handicapped toilet?: (Patient-Rptd) No  TIMED UP AND GO:  Was the test performed?  No  Cognitive Function: 6CIT completed        03/23/2024    2:24 PM 04/05/2023    2:36 PM 04/22/2022   10:08 AM 10/19/2017   11:13 AM  6CIT Screen  What Year? 0 points 0 points 0 points 0 points  What month? 0 points 0 points 0 points 0 points  What time? 0 points 0 points 0 points 0 points  Count back from 20 0 points 2 points 0 points 0 points  Months in reverse 0 points 0 points 0 points 0 points  Repeat phrase 2 points 2 points 0 points 0 points  Total Score 2 points 4 points 0 points 0 points    Immunizations Immunization History  Administered Date(s) Administered   Fluad Quad(high Dose 65+) 12/19/2020, 09/29/2021, 08/15/2022   H1N1 08/22/2009   Influenza, High Dose Seasonal PF 06/24/2015, 10/05/2016, 08/04/2018   Influenza,inj,Quad PF,6+ Mos 07/12/2013, 01/15/2015   Influenza-Unspecified 06/24/2015, 10/05/2016, 12/24/2017, 08/04/2018   PFIZER(Purple Top)SARS-COV-2 Vaccination 12/13/2019, 01/08/2020, 10/03/2020   Pfizer Covid-19 Vaccine Bivalent Booster 24yrs & up 09/29/2021   Pfizer(Comirnaty)Fall Seasonal Vaccine 12 years and older 08/15/2022   Pneumococcal Conjugate-13 05/20/2014   Pneumococcal Polysaccharide-23 01/31/2007   Tdap 01/31/2007, 12/19/2020   Zoster, Live 07/02/2008    Screening Tests Health Maintenance  Topic Date Due   Zoster Vaccines- Shingrix (1 of 2) 10/29/1963   COVID-19 Vaccine (6 - 2024-25 season) 06/19/2023   Pneumonia Vaccine 73+ Years old (3 of 3 - PPSV23, PCV20 or PCV21) 01/16/2029 (Originally 07/15/2014)   INFLUENZA VACCINE  05/18/2024   Medicare Annual Wellness (AWV)  03/23/2025   DTaP/Tdap/Td (3 - Td or Tdap) 12/20/2030   DEXA SCAN   Completed   Hepatitis C Screening  Completed   HPV VACCINES  Aged Out   Meningococcal B Vaccine  Aged Out   Colonoscopy  Discontinued   Fecal DNA (Cologuard)  Discontinued    Health Maintenance  Health Maintenance Due  Topic Date Due   Zoster Vaccines- Shingrix (1 of 2) 10/29/1963   COVID-19 Vaccine (6 - 2024-25 season) 06/19/2023   Health Maintenance Items Addressed: Due for shingles vaccine. States got covid last season at [employer],  Additional Screening:  Vision Screening: Recommended annual ophthalmology exams for early detection of glaucoma and other disorders of the eye. Would you like a referral to an eye doctor? No    Dental Screening: Recommended annual dental exams for proper oral hygiene  Community Resource Referral / Chronic Care Management: CRR required this visit?  No   CCM required this visit?  No   Plan:    I have personally reviewed and noted the following in the patient's chart:   Medical and social history Use of alcohol, tobacco or illicit drugs  Current medications and supplements including opioid prescriptions. Patient is not currently taking opioid prescriptions. Functional ability and status Nutritional status Physical activity Advanced directives List of other physicians Hospitalizations, surgeries, and ER visits in previous  12 months Vitals Screenings to include cognitive, depression, and falls Referrals and appointments  In addition, I have reviewed and discussed with patient certain preventive protocols, quality metrics, and best practice recommendations. A written personalized care plan for preventive services as well as general preventive health recommendations were provided to patient.   Rhonda Beecham, LPN   12/20/7423   After Visit Summary: (MyChart) Due to this being a telephonic visit, the after visit summary with patients personalized plan was offered to patient via MyChart   Notes: Nothing significant to report at this  time.

## 2024-04-24 NOTE — Progress Notes (Unsigned)
 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  referred to pulmonary clinic 04/27/2024 by Dr Monna  for doe and /pnds      Pt not previously seen by [facility] service > Dr Neysa read her sleep study in 2022  > neg   History of Present Illness  04/27/2024  Pulmonary/ 1st office eval/Hussam Muniz on ACEi  Chief Complaint  Patient presents with   Establish Care  Dyspnea:  more fatigue than doe shops ok [employer] but doesn't stay long  Cough: more of sense of pnds esp in am/ mucus is thick and white  Sleep: bed is flat one pillow on side 1130 - 9 am but doesn't feel rested  SABA use: none  02 ldz:wnwz     No obvious day to day or daytime pattern/variability or assoc excess/ purulent sputum or mucus plugs or hemoptysis or cp or chest tightness, subjective wheeze or overt sinus or hb symptoms.    Also denies any obvious fluctuation of symptoms with weather or environmental changes or other aggravating or alleviating factors except as outlined above   No unusual exposure hx or h/o childhood pna/ asthma or knowledge of premature birth.  Current Allergies, Complete Past Medical History, Past Surgical History, Family History, and Social History were reviewed in [employer] record.  ROS  The following are not active complaints unless bolded Hoarseness, sore throat, dysphagia, dental problems, itching, sneezing,  nasal congestion or discharge of excess mucus or purulent secretions, ear ache,   fever, chills, sweats, unintended wt loss or wt gain, classically pleuritic or exertional cp,  orthopnea pnd or arm/hand swelling  or leg swelling, presyncope, palpitations, abdominal pain, anorexia, nausea, vomiting, diarrhea  or change in bowel habits or change in bladder habits, change in stools or change in urine, dysuria, hematuria,  rash, arthralgias, visual complaints, headache, numbness,  weakness or ataxia or problems with walking or coordination,  change in mood or  memory.             Outpatient Medications Prior to Visit  Medication Sig Dispense Refill   anastrozole  (ARIMIDEX ) 1 MG tablet Take 1 tablet (1 mg total) by mouth daily. 90 tablet 3   Brinzolamide-Brimonidine 1-0.2 % SUSP Place 1 drop into both eyes 3 (three) times daily.     folic acid  (FOLVITE ) 1 MG tablet Take 3 mg by mouth daily. Hold on the day when  takes Methotrexate     lisinopril -hydrochlorothiazide  (ZESTORETIC ) 20-12.5 MG tablet Take 1 tablet by mouth daily. 90 tablet 3   methotrexate (RHEUMATREX) 2.5 MG tablet Take 15 mg by mouth once a week.  Caution:Chemotherapy. Protect from light.     metoprolol  succinate (TOPROL -XL) 50 MG 24 hr tablet TAKE 1/2 TABLET BY MOUTH DAILY. TAKE WITH OR IMMEDIATELY FOLLOWING A MEAL. 45 tablet 1   Polyethyl Glycol-Propyl Glycol (SYSTANE OP) Apply to eye.     potassium chloride  (KLOR-CON ) 10 MEQ tablet TAKE 1 TABLET BY MOUTH TWICE A DAY 180 tablet 1   prednisoLONE acetate (PRED FORTE) 1 % ophthalmic suspension Place 1 drop into both eyes daily.      rosuvastatin  (CRESTOR ) 40 MG tablet Take 1 tablet (40 mg total) by mouth daily. 90 tablet 3   timolol (TIMOPTIC) 0.5 % ophthalmic solution Place 1 drop into both eyes every morning.     No facility-administered medications prior to visit.  Past Medical History:  Diagnosis Date   Allergy    Arthritis    Asthma    Breast cancer (HCC)    Hypercholesterolemia    Hypertension    Peripheral arterial disease (HCC)    PONV (postoperative nausea and vomiting)    PUD (peptic ulcer disease)    Scleroderma (HCC)       Objective:     BP 118/74   Pulse 65   Ht 5' 5 (1.651 m)   Wt 172 lb 12.8 oz (78.4 kg)   LMP 02/16/2003   SpO2 98% Comment: RA  BMI 28.76 kg/m   SpO2: 98 % (RA) somber amb bf nad   HEENT : Oropharynx  clear      Nasal turbinates nl    NECK :  without  apparent JVD/ palpable Nodes/TM     LUNGS: no acc muscle use,  Nl contour chest which is clear to A and P bilaterally without cough on insp or exp maneuvers   CV:  RRR  no s3 or murmur or increase in P2, and no edema   ABD:  soft and nontender   MS:  Gait nl   ext warm without deformities Or obvious joint restrictions  calf tenderness, cyanosis or clubbing    SKIN: warm and dry without lesions    NEURO:  alert, approp, nl sensorium with  no motor or cerebellar deficits apparent.     I personally reviewed images and agree with radiology impression as follows:  CXR:   pa and lateral  04/27/2024  No active dz    Assessment   No problem-specific Assessment & Plan notes found for this encounter.     Ozell America, MD 04/27/2024

## 2024-04-27 ENCOUNTER — Ambulatory Visit: Admitting: Internal Medicine

## 2024-04-27 ENCOUNTER — Encounter: Payer: Self-pay | Admitting: Internal Medicine

## 2024-04-27 ENCOUNTER — Ambulatory Visit (INDEPENDENT_AMBULATORY_CARE_PROVIDER_SITE_OTHER)

## 2024-04-27 VITALS — BP 118/74 | HR 65 | Ht 65.0 in | Wt 172.8 lb

## 2024-04-27 DIAGNOSIS — R0609 Other forms of dyspnea: Secondary | ICD-10-CM | POA: Diagnosis not present

## 2024-04-27 DIAGNOSIS — I700 Atherosclerosis of aorta: Secondary | ICD-10-CM | POA: Diagnosis not present

## 2024-04-27 MED ORDER — OLMESARTAN MEDOXOMIL-HCTZ 20-12.5 MG PO TABS: 1.0000 | ORAL_TABLET | Freq: Every day | ORAL | 11 refills | Status: DC

## 2024-04-27 NOTE — Patient Instructions (Addendum)
 Stop lisinopril   and start olmesartan  20-12.5 one daily in place of lisinopril    Mucinex 1200 mg every 12 hours as needed for thick mucus   My office will be contacting you by phone for referral for High resolution CT chest   - if you don't hear back from my office within one week please call us  back or notify us  thru MyChart and we'll address it right away.   Please remember to go to the  x-ray department  for your tests - we will call you with the results when they are available    Please schedule a follow up visit in 3 months but call sooner if needed

## 2024-04-29 ENCOUNTER — Ambulatory Visit: Payer: Self-pay | Admitting: Internal Medicine

## 2024-04-29 NOTE — Assessment & Plan Note (Addendum)
 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 (Collect the dots to connect the dots approach)    Should pace slower if consistently dropping < 90% or accept portable 02 at this  poin t  Each maintenance medication was reviewed in detail including emphasizing most importantly the difference between maintenance and prns and under what circumstances the prns are to be triggered using an action plan format where appropriate.  Total time for H and P, chart review, counseling,  directly observing portions of ambulatory 02 saturation study/ and generating customized AVS unique to this office visit / same day charting = 45 min new pt eval.   F/u  HRCT available with all meds in hand using a trust but verify approach to confirm accurate Medication  Reconciliation The principal here is that until we are certain that the  patients are doing what we've asked, it makes no sense to ask them to do more.

## 2024-05-03 ENCOUNTER — Ambulatory Visit
Admission: RE | Admit: 2024-05-03 | Discharge: 2024-05-03 | Disposition: A | Discharge status: Home / Self Care | Source: Ambulatory Visit | Attending: Internal Medicine | Admitting: Internal Medicine

## 2024-05-03 DIAGNOSIS — I2510 Atherosclerotic heart disease of native coronary artery without angina pectoris: Secondary | ICD-10-CM | POA: Diagnosis not present

## 2024-05-03 DIAGNOSIS — R918 Other nonspecific abnormal finding of lung field: Secondary | ICD-10-CM | POA: Diagnosis not present

## 2024-05-03 DIAGNOSIS — R0609 Other forms of dyspnea: Secondary | ICD-10-CM

## 2024-05-11 ENCOUNTER — Telehealth: Payer: Self-pay

## 2024-05-11 ENCOUNTER — Other Ambulatory Visit: Payer: Self-pay

## 2024-05-11 DIAGNOSIS — I2510 Atherosclerotic heart disease of native coronary artery without angina pectoris: Secondary | ICD-10-CM

## 2024-05-11 DIAGNOSIS — E785 Hyperlipidemia, unspecified: Secondary | ICD-10-CM

## 2024-05-11 MED ORDER — ROSUVASTATIN CALCIUM 40 MG PO TABS: 40.0000 mg | ORAL_TABLET | Freq: Every day | ORAL | 3 refills | Status: AC

## 2024-05-11 NOTE — Progress Notes (Signed)
   05/11/2024  Patient ID: Rhonda Buchanan  CHRISTELLA [place]on, female   DOB: 1946-08-27, 78 y.o.   MRN: [id number]  Pharmacy Quality Measure Review  This patient is appearing on a report for being at risk of failing the adherence measure for cholesterol (statin) medications this calendar year.   Medication: Rosuvastatin  40mg  Last fill date: 02/17/24 for 90 day supply  Will collaborate with provider to facilitate refill needs.  Jon VEAR Lindau, PharmD Clinical Pharmacist [phone]

## 2024-05-17 NOTE — Progress Notes (Signed)
   05/17/2024  Patient ID: Rhonda  CHRISTELLA Buchanan, female   DOB: October 22, 1945, 78 y.o.   MRN: [id number]  Pharmacy Quality Measure Review  This patient is appearing on a report for being at risk of failing the adherence measure for cholesterol (statin) medications this calendar year.   Medication: Rosuvastatin  40mg  Last fill date: 05/11/24 for 90 day supply   No further action needed at this time.  Jon VEAR Lindau, PharmD Clinical Pharmacist [phone]

## 2024-06-19 ENCOUNTER — Encounter: Payer: Self-pay | Admitting: Family Medicine

## 2024-06-19 ENCOUNTER — Ambulatory Visit (INDEPENDENT_AMBULATORY_CARE_PROVIDER_SITE_OTHER): Admitting: Family Medicine

## 2024-06-19 VITALS — BP 124/70 | HR 64 | Ht 66.5 in | Wt 168.2 lb

## 2024-06-19 DIAGNOSIS — I10 Essential (primary) hypertension: Secondary | ICD-10-CM | POA: Diagnosis not present

## 2024-06-19 DIAGNOSIS — D0511 Intraductal carcinoma in situ of right breast: Secondary | ICD-10-CM

## 2024-06-19 DIAGNOSIS — Z0000 Encounter for general adult medical examination without abnormal findings: Secondary | ICD-10-CM | POA: Diagnosis not present

## 2024-06-19 DIAGNOSIS — L940 Localized scleroderma [morphea]: Secondary | ICD-10-CM

## 2024-06-19 DIAGNOSIS — H20023 Recurrent acute iridocyclitis, bilateral: Secondary | ICD-10-CM | POA: Diagnosis not present

## 2024-06-19 DIAGNOSIS — Z23 Encounter for immunization: Secondary | ICD-10-CM | POA: Diagnosis not present

## 2024-06-19 DIAGNOSIS — I2510 Atherosclerotic heart disease of native coronary artery without angina pectoris: Secondary | ICD-10-CM

## 2024-06-19 DIAGNOSIS — M349 Systemic sclerosis, unspecified: Secondary | ICD-10-CM | POA: Diagnosis not present

## 2024-06-19 DIAGNOSIS — E782 Mixed hyperlipidemia: Secondary | ICD-10-CM | POA: Diagnosis not present

## 2024-06-19 DIAGNOSIS — I739 Peripheral vascular disease, unspecified: Secondary | ICD-10-CM

## 2024-06-19 DIAGNOSIS — J45909 Unspecified asthma, uncomplicated: Secondary | ICD-10-CM

## 2024-06-19 DIAGNOSIS — J301 Allergic rhinitis due to pollen: Secondary | ICD-10-CM

## 2024-06-19 DIAGNOSIS — I700 Atherosclerosis of aorta: Secondary | ICD-10-CM | POA: Diagnosis not present

## 2024-06-19 DIAGNOSIS — M1990 Unspecified osteoarthritis, unspecified site: Secondary | ICD-10-CM

## 2024-06-19 DIAGNOSIS — R0609 Other forms of dyspnea: Secondary | ICD-10-CM | POA: Diagnosis not present

## 2024-06-19 DIAGNOSIS — Z79899 Other long term (current) drug therapy: Secondary | ICD-10-CM

## 2024-06-19 LAB — LIPID PANEL
Chol/HDL Ratio: 3.5 ratio (ref 0.0–4.4)
Cholesterol, Total: 223 mg/dL — ABNORMAL HIGH (ref 100–199)
HDL: 64 mg/dL (ref >39)
LDL Chol Calc (NIH): 140 mg/dL — ABNORMAL HIGH (ref 0–99)
Triglycerides: 109 mg/dL (ref 0–149)
VLDL Cholesterol Cal: 19 mg/dL (ref 5–40)

## 2024-06-19 MED ORDER — OLMESARTAN MEDOXOMIL 20 MG PO TABS: 20.0000 mg | ORAL_TABLET | Freq: Every day | ORAL | 0 refills | Status: DC

## 2024-06-19 MED ORDER — POTASSIUM CHLORIDE CRYS ER 20 MEQ PO TBCR: 20.0000 meq | EXTENDED_RELEASE_TABLET | Freq: Every day | ORAL | 3 refills | Status: DC

## 2024-06-19 MED ORDER — METOPROLOL SUCCINATE ER 50 MG PO TB24: ORAL_TABLET | ORAL | 3 refills | Status: AC

## 2024-06-19 NOTE — Progress Notes (Signed)
 Rhonda  KARESSA Buchanan is a 78 y.o. female who presents for annual wellness visit and follow-up on chronic medical conditions.  Discussed the use of AI scribe software for clinical note transcription with the patient, who gave verbal consent to proceed. Discussed the use of AI scribe software for clinical note transcription with the patient, who gave verbal consent to proceed.  History of Present Illness   Her blood pressure medication was changed several months ago, initially by a staff member at her doctor's office and further adjusted in July. She was switched from lisinopril  to losartan due to a persistent cough. Since the change, she has experienced low blood pressure readings, accompanied by weakness, fatigue, and dizziness. Her blood pressure cuff was confirmed to be accurate.  She has a history of breast cancer and underwent a lumpectomy in January. She is currently on Remadex and has regular follow-ups with her oncologist.  She is on Arimidex  psychologically she seems to be in need of help based on her PHQ.  She has aortic atherosclerosis and is on Crestor  for cholesterol management.  She also has a history of scleroderma and is under the care of a dermatologist, who prescribes methotrexate.  She reports eye issues and uses multiple eye drops, including Tylenol  and Zabrinza. Her last ophthalmology visit was at the beginning of the year. She sees her dermatologist regularly and is on methotrexate. She has a history of knee issues and was scheduled for knee replacement surgery, which was canceled due to low potassium levels and her history of lung cancer. She experiences burning and pain in her knee and ankle.  She has not had a shingles shot since 2009 and is considering updating her immunizations, including COVID, flu, and RSV shots.  She reports improved sleep and is not currently receiving counseling, though she acknowledges the availability of support services following her lumpectomy.      Her allergies are under good control.  She has not had any difficulty with asthma.   Immunizations and Health Maintenance Immunization History  Administered Date(s) Administered   Fluad Quad(high Dose 65+) 12/19/2020, 09/29/2021, 08/15/2022   H1N1 08/22/2009   INFLUENZA, HIGH DOSE SEASONAL PF 06/24/2015, 10/05/2016, 08/04/2018   Influenza,inj,Quad PF,6+ Mos 07/12/2013, 01/15/2015   Influenza-Unspecified 06/24/2015, 10/05/2016, 12/24/2017, 08/04/2018   PFIZER(Purple Top)SARS-COV-2 Vaccination 12/13/2019, 01/08/2020, 10/03/2020   Pfizer Covid-19 Vaccine Bivalent Booster 43yrs & up 09/29/2021   Pfizer(Comirnaty)Fall Seasonal Vaccine 12 years and older 08/15/2022   Pneumococcal Conjugate-13 05/20/2014   Pneumococcal Polysaccharide-23 01/31/2007   Tdap 01/31/2007, 12/19/2020   Zoster, Live 07/02/2008   Health Maintenance Due  Topic Date Due   Zoster Vaccines- Shingrix (1 of 2) 10/29/1964   INFLUENZA VACCINE  05/18/2024   COVID-19 Vaccine (6 - 2025-26 season) 06/18/2024    Last Pap smear:15 years ago Last mammogram:09/02/23 Last colonoscopy: cologuard 04/14/20 Last DEXA: 11/29/23 Dentist: ? Not sure, unknown the last visit Ophtho:[facility] 1/25 Exercise: 3 x a week 30 minutes  Other doctors caring for patient include: Deveshwar Wert  [place]  Advanced directives:yes copy asked for    Depression screen:  See questionnaire below.     03/23/2024    2:22 PM 09/21/2023    1:07 PM 04/05/2023    2:34 PM 04/22/2022   10:06 AM 04/15/2021   10:05 AM  Depression screen PHQ 2/9  Decreased Interest 0 0 0 0 0  Down, Depressed, Hopeless 0 0 0 0 0  PHQ - 2 Score 0 0 0 0 0  Altered sleeping 3  3    Tired, decreased energy 2  3    Change in appetite 0  0    Feeling bad or failure about yourself  0  0    Trouble concentrating 0  0    Moving slowly or fidgety/restless 0  0    Suicidal thoughts 0  0    PHQ-9 Score 5  6    Difficult doing work/chores Somewhat difficult  Not difficult at  all      Fall Risk Screen: see questionnaire below.    03/23/2024    2:16 PM 04/04/2023    8:46 AM 01/04/2023    3:43 PM 04/22/2022   10:05 AM 04/15/2021   10:04 AM  Fall Risk   Falls in the past year? 0 0 0 0 0  Number falls in past yr: 0 0 0 0 0  Injury with Fall? 0 0 0 0 0  Risk for fall due to : Medication side effect Medication side effect No Fall Risks No Fall Risks No Fall Risks  Follow up Falls prevention discussed;Falls evaluation completed Falls prevention discussed;Education provided;Falls evaluation completed Falls evaluation completed Falls evaluation completed  Falls evaluation completed      Data saved with a previous flowsheet row definition    ADL screen:  See questionnaire below Functional Status Survey:     Review of Systems Constitutional: -, -unexpected weight change, -anorexia, -fatigue Allergy: -sneezing, -itching, -congestion Dermatology: denies changing moles, rash, lumps ENT: -runny nose, -ear pain, -sore throat,  Cardiology:  -chest pain, -palpitations, -orthopnea, Respiratory: -cough, -shortness of breath, -dyspnea on exertion, -wheezing,  Gastroenterology: -abdominal pain, -nausea, -vomiting, -diarrhea, -constipation, -dysphagia Hematology: -bleeding or bruising problems Musculoskeletal: -arthralgias, -myalgias, -joint swelling, -back pain, - Ophthalmology: -vision changes,  Urology: -dysuria, -difficulty urinating,  -urinary frequency, -urgency, incontinence Neurology: -, -numbness, , -memory loss, -falls, -dizziness    PHYSICAL EXAM:  LMP 02/16/2003   General Appearance: Alert, cooperative, no distress, appears stated age Head: Normocephalic, without obvious abnormality, atraumatic Eyes: PERRL, conjunctiva/corneas clear, EOM's intact, Ears: Normal TM's and external ear canals Nose: Nares normal, mucosa normal, no drainage or sinus tenderness Throat: Lips, mucosa, and tongue normal; teeth and gums normal Neck: Supple, no lymphadenopathy;   thyroid :  no enlargement/tenderness/nodules; no carotid bruit or JVD Lungs: Clear to auscultation bilaterally without wheezes, rales or ronchi; respirations unlabored Heart: Regular rate and rhythm, S1 and S2 normal, no murmur, rubor gallop Abdomen: Soft, non-tender, nondistended, normoactive bowel sounds,  no masses, no hepatosplenomegaly Lymph nodes: Cervical, supraclavicular, and axillary nodes normal Neurologic:  CNII-XII intact, normal strength, sensation and gait; reflexes 2+ and symmetric throughout Psych: Normal mood, affect, hygiene and grooming.  ASSESSMENT/PLAN:    Essential hypertension Symptoms indicate overmedication with current olmesartan  20/12.5 mg. - Discontinue olmesartan  20/12.5 mg. - Prescribe olmesartan  20 mg without hydrochlorothiazide . - Monitor blood pressure daily or every other day. - Instruct her to send a message via MyChart in one month with blood pressure updates.  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  Intraductal carcinoma in situ of right breast, post-lumpectomy Post-lumpectomy status, on Remadex, under active surveillance. - Scheduled for a mammogram in October.  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 - Encourage use of counseling services at the oncology department. - Consider medication if depressive symptoms persist.  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 Leni.  Recurrent acute iridocyclitis, bilateral Managed  with ophthalmic drops. Last ophthalmology visit at the beginning of the year.  Osteoarthritis of right knee Planned knee replacement surgery postponed due to low potassium levels.     Allergic rhinitis/asthma no meds at the present time    Discussed monthly self breast exams and yearly  mammograms; at least 30 minutes of aerobic activity at least 5 days/week and weight-bearing exercise 2x/week; proper sunscreen use reviewed; healthy diet, including goals of calcium  and vitamin D intake and alcohol recommendations (less than or equal to 1 drink/day) reviewed; regular seatbelt use; changing batteries in smoke detectors.  Immunization recommendations discussed.  Colonoscopy recommendations reviewed   Medicare Attestation I have personally reviewed: The patient's medical and social history Their use of alcohol, tobacco or illicit drugs Their current medications and supplements The patient's functional ability including ADLs,fall risks, home safety risks, cognitive, and hearing and visual impairment Diet and physical activities Evidence for depression or mood disorders  The patient's weight, height, and BMI have been recorded in the chart.  I have made referrals, counseling, and provided education to the patient based on review of the above and I have provided the patient with a written personalized care plan for preventive services.     Norleen Jobs, MD   06/19/2024

## 2024-07-23 DIAGNOSIS — L940 Localized scleroderma [morphea]: Secondary | ICD-10-CM | POA: Diagnosis not present

## 2024-07-23 DIAGNOSIS — Z79631 Long term (current) use of antimetabolite agent: Secondary | ICD-10-CM | POA: Diagnosis not present

## 2024-07-23 DIAGNOSIS — H209 Unspecified iridocyclitis: Secondary | ICD-10-CM | POA: Diagnosis not present

## 2024-07-25 DIAGNOSIS — H40053 Ocular hypertension, bilateral: Secondary | ICD-10-CM | POA: Diagnosis not present

## 2024-07-25 DIAGNOSIS — Z961 Presence of intraocular lens: Secondary | ICD-10-CM | POA: Diagnosis not present

## 2024-07-25 DIAGNOSIS — H04123 Dry eye syndrome of bilateral lacrimal glands: Secondary | ICD-10-CM | POA: Diagnosis not present

## 2024-07-25 DIAGNOSIS — H40023 Open angle with borderline findings, high risk, bilateral: Secondary | ICD-10-CM | POA: Diagnosis not present

## 2024-07-30 ENCOUNTER — Ambulatory Visit (HOSPITAL_BASED_OUTPATIENT_CLINIC_OR_DEPARTMENT_OTHER)
Admission: RE | Admit: 2024-07-30 | Discharge: 2024-07-30 | Disposition: A | Discharge status: Home / Self Care | Source: Ambulatory Visit | Attending: Internal Medicine | Admitting: Internal Medicine

## 2024-07-30 ENCOUNTER — Ambulatory Visit (HOSPITAL_COMMUNITY)
Admission: RE | Admit: 2024-07-30 | Discharge: 2024-07-30 | Disposition: A | Source: Ambulatory Visit | Attending: Family Medicine

## 2024-07-30 ENCOUNTER — Encounter (HOSPITAL_COMMUNITY): Payer: Self-pay | Admitting: Internal Medicine

## 2024-07-30 ENCOUNTER — Ambulatory Visit (HOSPITAL_COMMUNITY)
Admission: RE | Admit: 2024-07-30 | Discharge: 2024-07-30 | Disposition: A | Discharge status: Home / Self Care | Source: Ambulatory Visit | Attending: Family Medicine | Admitting: Family Medicine

## 2024-07-30 VITALS — BP 90/50 | HR 59 | Wt 173.8 lb

## 2024-07-30 DIAGNOSIS — R942 Abnormal results of pulmonary function studies: Secondary | ICD-10-CM

## 2024-07-30 DIAGNOSIS — I110 Hypertensive heart disease with heart failure: Secondary | ICD-10-CM | POA: Insufficient documentation

## 2024-07-30 DIAGNOSIS — D0511 Intraductal carcinoma in situ of right breast: Secondary | ICD-10-CM | POA: Diagnosis not present

## 2024-07-30 DIAGNOSIS — I350 Nonrheumatic aortic (valve) stenosis: Secondary | ICD-10-CM | POA: Insufficient documentation

## 2024-07-30 DIAGNOSIS — M349 Systemic sclerosis, unspecified: Secondary | ICD-10-CM | POA: Insufficient documentation

## 2024-07-30 DIAGNOSIS — Z79899 Other long term (current) drug therapy: Secondary | ICD-10-CM | POA: Insufficient documentation

## 2024-07-30 DIAGNOSIS — Z79811 Long term (current) use of aromatase inhibitors: Secondary | ICD-10-CM | POA: Diagnosis not present

## 2024-07-30 DIAGNOSIS — I10 Essential (primary) hypertension: Secondary | ICD-10-CM

## 2024-07-30 DIAGNOSIS — I2510 Atherosclerotic heart disease of native coronary artery without angina pectoris: Secondary | ICD-10-CM | POA: Diagnosis not present

## 2024-07-30 DIAGNOSIS — I5022 Chronic systolic (congestive) heart failure: Secondary | ICD-10-CM | POA: Insufficient documentation

## 2024-07-30 DIAGNOSIS — M3489 Other systemic sclerosis: Secondary | ICD-10-CM | POA: Diagnosis not present

## 2024-07-30 LAB — PULMONARY FUNCTION TEST
DL/VA % pred: 73 %
DL/VA: 2.97 ml/min/mmHg/L
DLCO unc % pred: 55 %
DLCO unc: 11.1 ml/min/mmHg
FEF 25-75 Post: 1.33 L/s
FEF 25-75 Pre: 1.86 L/s
FEF2575-%Change-Post: -28 %
FEF2575-%Pred-Post: 84 %
FEF2575-%Pred-Pre: 118 %
FEV1-%Change-Post: -7 %
FEV1-%Pred-Post: 79 %
FEV1-%Pred-Pre: 85 %
FEV1-Post: 1.72 L
FEV1-Pre: 1.86 L
FEV1FVC-%Change-Post: 1 %
FEV1FVC-%Pred-Pre: 110 %
FEV6-%Change-Post: -8 %
FEV6-%Pred-Post: 75 %
FEV6-%Pred-Pre: 82 %
FEV6-Post: 2.09 L
FEV6-Pre: 2.28 L
FEV6FVC-%Pred-Post: 105 %
FEV6FVC-%Pred-Pre: 105 %
FVC-%Change-Post: -8 %
FVC-%Pred-Post: 71 %
FVC-%Pred-Pre: 78 %
FVC-Post: 2.09 L
FVC-Pre: 2.28 L
Post FEV1/FVC ratio: 83 %
Post FEV6/FVC ratio: 100 %
Pre FEV1/FVC ratio: 81 %
Pre FEV6/FVC Ratio: 100 %
RV % pred: 107 %
RV: 2.65 L
TLC % pred: 93 %
TLC: 5.03 L

## 2024-07-30 LAB — ECHOCARDIOGRAM COMPLETE
AR max vel: 1.64 cm2
AV Area VTI: 1.81 cm2
AV Area mean vel: 1.9 cm2
AV Mean grad: 9.5 mmHg
AV Peak grad: 20.2 mmHg
Ao pk vel: 2.25 m/s
Area-P 1/2: 2.76 cm2
Calc EF: 68.2 %
S' Lateral: 1.9 cm
Single Plane A2C EF: 70.1 %
Single Plane A4C EF: 69.6 %

## 2024-07-30 MED ORDER — ALBUTEROL SULFATE (2.5 MG/3ML) 0.083% IN NEBU
2.5000 mg | INHALATION_SOLUTION | Freq: Once | RESPIRATORY_TRACT | Status: AC
  Administered 2024-07-30: 2.5 mg via RESPIRATORY_TRACT

## 2024-07-30 MED ORDER — OLMESARTAN MEDOXOMIL 20 MG PO TABS: 10.0000 mg | ORAL_TABLET | Freq: Every day | ORAL | 3 refills | Status: DC

## 2024-07-30 NOTE — Patient Instructions (Signed)
 Medication Changes:  DECREASE Benicar  to 10 mg (1/2 tab) Daily   Testing/Procedures:  Your physician has recommended that you have a pulmonary function test. Pulmonary Function Tests are a group of tests that measure how well air moves in and out of your lungs. IN 1 YEAR   Your physician has requested that you have an echocardiogram. Echocardiography is a painless test that uses sound waves to create images of your heart. It provides your doctor with information about the size and shape of your heart and how well your heart's chambers and valves are working. This procedure takes approximately one hour. There are no restrictions for this procedure. Please do NOT wear cologne, perfume, aftershave, or lotions (deodorant is allowed). Please arrive 15 minutes prior to your appointment time. IN 1 YEAR  Please note: We ask at that you not bring children with you during ultrasound (echo/ vascular) testing. Due to room size and safety concerns, children are not allowed in the ultrasound rooms during exams. Our front office staff cannot provide observation of children in our lobby area while testing is being conducted. An adult accompanying a patient to their appointment will only be allowed in the ultrasound room at the discretion of the ultrasound technician under special circumstances. We apologize for any inconvenience.    Follow-Up in: 1 YEAR with an echocardiogram and PFT's (October 2026), **PLEASE CALL OUR OFFICE IN AUGUST TO SCHEDULE THIS APPOINTMENT   At the [facility], you and your health needs are our priority. We have a designated team specialized in the treatment of Heart Failure. This Care Team includes your primary Heart Failure Specialized Cardiologist (physician), Advanced Practice Providers (APPs- Physician Assistants and Nurse Practitioners), and Pharmacist who all work together to provide you with the care you need, when you need it.   You may see any of the  following providers on your designated Care Team at your next follow up:  Dr. Toribio Fuel Dr. Ezra Shuck Dr. Ria Commander Dr. Odis Brownie Greig Mosses, NP Caffie Shed, [facility] [place], Joia Beckey Coe, NP [place] Lee, NP Tinnie Redman, PharmD   Please be sure to bring in all your medications bottles to every appointment.   Need to Contact Us :  If you have any questions or concerns before your next appointment please send us  a message through [place] or call our office at [phone].    TO LEAVE A MESSAGE FOR THE NURSE SELECT OPTION 2, PLEASE LEAVE A MESSAGE INCLUDING: YOUR NAME DATE OF BIRTH CALL BACK NUMBER REASON FOR CALL**this is important as we prioritize the call backs  YOU WILL RECEIVE A CALL BACK THE SAME DAY AS LONG AS YOU CALL BEFORE 4:00 PM

## 2024-07-30 NOTE — Addendum Note (Signed)
 Encounter addended by: Buell Powell HERO, RN on: 07/30/2024 10:22 AM  Actions taken: Visit diagnoses modified, Order list changed, Diagnosis association updated, Clinical Note Signed

## 2024-07-30 NOTE — Progress Notes (Addendum)
 [facility]  NOTE  Primary Care: Dr. JINNY Jobs HF Cardiologist: Dr. Cherrie  Chief complaint: Scleroderma/PAH screening  HPI: Rhonda Buchanan is a 78 y.o. female with HTN, morphea scleroderma and R breast DCIS.  Sleep study 8/22 negative    S/p R breast lumpectomy 1/25, clear margins and no invasive CA. Followed by Heme/Onc, started on adjuvant hormonal therapy, aromatase inhibitor Arimedix.   Today she returns for f/u for PAH screening. BP medications have been changed multiple times by Drs. Wert and [place]. Says BP still running low with systolics in 90-100 range. Feels fatigued. No CP or edema. Saw Dr. Darlean and sats dropped with all walk HRCT ordered in 7/25 small airway disease but no ILD   Echo today 07/30/24: EF 60-65% RV normal. No sig TR. Mild AS mean gradient 10 Personally reviewed  PFTS 07/30/24 FEV1  1.86 (85%) FVC    2.28L (78%) DLCO 55%     Cardiac Studies  - Echo 09/13/23  EF 65-70% RV normal RVSP  - PFTS 09/13/23  FEV1  2.06 (93%) FVC    2.41L (88%) DLCO 66%  - Echo 08/12/22: EF 60-65% G1DD RV normal. Mild TR RVSP 30-8mmHG  PFTS 08/12/22:  FEV1  2.08 (93%) FVC    2.64L (88%) DLCO 72%  Sleep study 8/22 negative   - Echo 08/15/20: EF 60-65% RV normal. No evidence of PAH. Aov calcified no significant AS Personally reviewed  PFTs 10/21 FEV1  1.86 (98%) FVC    2.25 L (92%) DLCO 73%  PFTs 2/20 FEV1  2.0L (111%) FVC    2.4L (103% DLCO 70%  PFTs 08/12/17-  FEV1 2.03 (109%) FVC  2.43 (101%) DLCO 60%  09/2017 Stress Echo EF 65-70%, Grade 1 DD. No chest pain. + Hypertensive BP response. No stress induced WMAs.   08/12/2017 Echo 60-65%. No evidence PAH. Mild AS Normal RV   Hi-res CT of chest done 8/17 for abnormal DLCO 1. No evidence of interstitial lung disease. 2. No evidence of achalasia. 3. No acute findings in the thorax. 4. Aortic atherosclerosis, in addition to left main and 3 vessel coronary artery disease.  Please note that although the presence of coronary artery calcium  documents the presence of coronary artery disease, the severity of this disease and any potential stenosis cannot be assessed on this non-gated CT examination. Assessment for potential risk factor modification, dietary therapy or pharmacologic therapy may be warranted, if clinically indicated. 5. There are calcifications of the aortic valve. Echocardiographic correlation for evaluation of potential valvular dysfunction may be warranted if clinically indicated.  Past Medical History:  Diagnosis Date   Allergy    Arthritis    Asthma    Breast cancer (HCC)    Hypercholesterolemia    Hypertension    Peripheral arterial disease    PONV (postoperative nausea and vomiting)    PUD (peptic ulcer disease)    Scleroderma (HCC)    Current Outpatient Medications  Medication Sig Dispense Refill   anastrozole  (ARIMIDEX ) 1 MG tablet Take 1 tablet (1 mg total) by mouth daily. 90 tablet 3   Brinzolamide-Brimonidine 1-0.2 % SUSP Place 1 drop into both eyes 3 (three) times daily.     folic acid  (FOLVITE ) 1 MG tablet Take 3 mg by mouth daily. Hold on the day when  takes Methotrexate     methotrexate (RHEUMATREX) 2.5 MG tablet Take 15 mg by mouth once a week.  Caution:Chemotherapy. Protect from light.     metoprolol  succinate (TOPROL -XL)  50 MG 24 hr tablet TAKE 1/2 TABLET BY MOUTH DAILY. TAKE WITH OR IMMEDIATELY FOLLOWING A MEAL. 45 tablet 3   olmesartan  (BENICAR ) 20 MG tablet Take 1 tablet (20 mg total) by mouth daily. 90 tablet 0   Polyethyl Glycol-Propyl Glycol (SYSTANE OP) Apply to eye.     potassium chloride  SA (KLOR-CON  M) 20 MEQ tablet Take 1 tablet (20 mEq total) by mouth daily. 90 tablet 3   prednisoLONE acetate (PRED FORTE) 1 % ophthalmic suspension Place 1 drop into both eyes daily.      rosuvastatin  (CRESTOR ) 40 MG tablet Take 1 tablet (40 mg total) by mouth daily. 90 tablet 3   timolol (TIMOPTIC) 0.5 % ophthalmic solution  Place 1 drop into both eyes every morning.     No current facility-administered medications for this encounter.   Allergies  Allergen Reactions   Aspirin  Nausea Only   Ibuprofen Nausea Only    GI upset   Oxycodone Other (See Comments)    Gi- upset   Social History   Socioeconomic History   Marital status: Divorced    Spouse name: Not on file   Number of children: Not on file   Years of education: Not on file   Highest education level: Some college, no degree  Occupational History   Not on file  Tobacco Use   Smoking status: Never    Passive exposure: Never   Smokeless tobacco: Never  Vaping Use   Vaping status: Never Used  Substance and Sexual Activity   Alcohol use: No   Drug use: No   Sexual activity: Not Currently  Other Topics Concern   Not on file  Social History Narrative   Not on file   Social Drivers of Health   Financial Resource Strain: Low Risk  (06/18/2024)   Overall Financial Resource Strain (CARDIA)    Difficulty of Paying Living Expenses: Not hard at all  Food Insecurity: No Food Insecurity (06/18/2024)   Hunger Vital Sign    Worried About Running Out of Food in the Last Year: Never true    Ran Out of Food in the Last Year: Never true  Transportation Needs: No Transportation Needs (06/18/2024)   PRAPARE - Administrator, Civil Service (Medical): No    Lack of Transportation (Non-Medical): No  Physical Activity: Insufficiently Active (06/18/2024)   Exercise Vital Sign    Days of Exercise per Week: 3 days    Minutes of Exercise per Session: 20 min  Stress: No Stress Concern Present (06/18/2024)   [employer] of Occupational Health - Occupational Stress Questionnaire    Feeling of Stress: Only a little  Social Connections: Moderately Isolated (06/18/2024)   Social Connection and Isolation Panel    Frequency of Communication with Friends and Family: Three times a week    Frequency of Social Gatherings with Friends and Family: More than three  times a week    Attends Religious Services: 1 to 4 times per year    Active Member of [employer] or Organizations: No    Attends Banker Meetings: Not on file    Marital Status: Divorced  Intimate Partner Violence: Not At Risk (03/23/2024)   Humiliation, Afraid, Rape, and Kick questionnaire    Fear of Current or Ex-Partner: No    Emotionally Abused: No    Physically Abused: No    Sexually Abused: No   Family History  Problem Relation Age of Onset   Cancer Father 47 - 21  unknown type   Ulcers Sister    High Cholesterol Sister    Ulcers Brother    Heart disease Brother 14       MI   Prostate cancer Maternal Uncle    Breast cancer Maternal Grandmother 69 - 69   Heart disease Maternal Grandfather    Cancer Cousin        unknown types, maternal first cousins   LMP 02/16/2003   Vitals:   07/30/24 0951  BP: (!) 90/50  Pulse: (!) 59  SpO2: 99%    Wt Readings from Last 3 Encounters:  06/19/24 76.3 kg (168 lb 3.2 oz)  04/27/24 78.4 kg (172 lb 12.8 oz)  03/14/24 78.5 kg (173 lb)    PHYSICAL EXAM: General:  Well appearing. No resp difficulty HEENT: normal Neck: supple. no JVD. Carotids 2+ bilat; no bruits. No lymphadenopathy or thryomegaly appreciated. Cor: PMI nondisplaced. Regular rate & rhythm. No AS murmur heard  Lungs: clear Abdomen: soft, nontender, nondistended. No hepatosplenomegaly. No bruits or masses. Good bowel sounds. Extremities: no cyanosis, clubbing, rash, edema Neuro: alert & orientedx3, cranial nerves grossly intact. moves all 4 extremities w/o difficulty. Affect pleasant   ECG (personally reviewed): Sinus brady 57 1AVB Personally reviewed  ASSESSMENT & PLAN: 1. Scleroderma - 09/2017 Stress Echo EF 65-70%, Grade 1 DD. No chest pain. + Hypertensive BP response. No stress induced WMAs - Echo 2/20 EF 55-60% Grade II DD. Thickened Aov without overt AS - Echo 08/15/20 : EF 60-65% RV normal. No evidence of PAH. Aov calcified no significant  AS  - Echo 08/12/22 EF 60-65% G1DD RV normal. Mild TR RVSP 30-11mmHG - Echo 09/13/23  EF 65-70% RV normal RVSP Aov calcified no significant AS Personally reviewed - Echo today 07/30/24: EF 60-65% RV normal. No sig TR. Mild AS mean gradient 10 Personally reviewed - Mild drop in DLCO. HAs seen Dr. Kathrine. Hires CT 7/25 no ILD. Continue to follow  - Continue screening every 1-2 years  2. HTN - BP low - Drop Benicar  to 10 - Further management per Dr. Joyce  3. Very mild aortic stenosis - AoV quite calcified, but no frank AS.  - Echo today reviewed personally. Very mild AS  Continue to follow  4. Coronary calcifications - By CT scan. - Stress Echo ( 2/18): EF 65-70%, Grade 1 DD. No chest pain. + Hypertensive BP response. No stress induced WMAs - No s/s angina - On statin per PCP  5. DCIS R Breast  - now s/p lumpectomy - followed by Dr. Loretha - Now on anastrozole  - tolerating   Toribio Fuel, MD  9:01 AM

## 2024-08-06 ENCOUNTER — Ambulatory Visit (HOSPITAL_COMMUNITY): Payer: Self-pay | Admitting: Family Medicine

## 2024-08-07 NOTE — Progress Notes (Signed)
 Pharmacy Quality Measure Review  This patient is appearing on a report for being at risk of failing the adherence measure for cholesterol (statin) medications this calendar year.   Medication: rosuvastatin  40 mg daily Last fill date: 07/29/2024 for 90 day supply  Insurance report was not up to date. No action needed at this time. Refills remaining on prescription.   Woodie Jock, PharmD PGY1 Pharmacy Resident  08/07/2024

## 2024-08-09 NOTE — Progress Notes (Signed)
 Office Visit Note  Patient: Rhonda  ABISAI Buchanan             Date of Birth: 1946-09-03           MRN: [id number]             PCP: Joyce Norleen BROCKS, MD Referring: Joyce Norleen BROCKS, MD Visit Date: 08/23/2024 Occupation: Data Unavailable  Subjective:  Medication monitoring    History of Present Illness: Rhonda  ARDELLE Buchanan is a 78 y.o. female with history of scleroderma.  Patient remains on  Methotrexate 5 tablets by mouth once weekly and folic acid  2 mg by mouth daily.  She is tolerating methotrexate without any side effects and has not had any gaps in therapy.  Patient continues to have cool fingertips to the touch and intermittent symptoms of Raynaud's phenomenon.  The symptoms have not been any more severe than usual recently.  She denies any digital ulcerations.  She denies any skin tightness or thickening.  Patient denies any dysphagia or other new gastrointestinal symptoms. Patient reports that she was using CeraVe a facial cleanser for about 1 month and noticed a facial rash.  She was concerned that it may be a butterfly rash related to lupus but states that since discontinuing CeraVe the rash has subsided. She denies any prolonged sun exposure.  Patient states that her blood pressure has been labile recently.  She has been taking off of lisinopril  and has had some other medication changes.  Patient states that she has noticed increased edema since coming off of the diuretic she was previously prescribed.  She will be following up with her primary care to discuss her blood pressure medications. She denies any new or worsening pulmonary symptoms.  She has been evaluated by Dr. Darlean and Dr. Cherrie recently. She denies any recent or recurrent infections.   Activities of Daily Living:  Patient reports morning stiffness for 1 or less minute.   Patient Denies nocturnal pain.  Difficulty dressing/grooming: Denies Difficulty climbing stairs: Reports Difficulty getting out of chair:  Reports Difficulty using hands for taps, buttons, cutlery, and/or writing: Reports  Review of Systems  Constitutional:  Negative for fatigue.  HENT:  Negative for mouth sores and mouth dryness.   Eyes:  Positive for dryness.  Respiratory:  Negative for shortness of breath.   Cardiovascular:  Negative for chest pain and palpitations.  Gastrointestinal:  Negative for blood in stool, constipation and diarrhea.  Endocrine: Negative for increased urination.  Genitourinary:  Negative for involuntary urination.  Musculoskeletal:  Positive for joint pain, gait problem, joint pain, joint swelling, myalgias, morning stiffness and myalgias. Negative for muscle weakness and muscle tenderness.  Skin:  Positive for rash and hair loss. Negative for color change and sensitivity to sunlight.  Allergic/Immunologic: Negative for susceptible to infections.  Neurological:  Negative for dizziness and headaches.  Hematological:  Negative for swollen glands.  Psychiatric/Behavioral:  Negative for depressed mood and sleep disturbance. The patient is not nervous/anxious.     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   Atherosclerosis of coronary artery of  native heart without angina pectoris 10/05/2016   Abnormal PFTs 05/21/2016   Asthma due to environmental allergies 06/24/2015   Positive PPD, treated 06/05/2015   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/14/2014   Scleroderma (HCC) 07/12/2013   Essential hypertension 03/04/2011   Hyperlipidemia 03/04/2011   Allergic rhinitis 03/04/2011    Arthritis 03/04/2011   Migraine variant 04/18/1995    Past Medical History:  Diagnosis Date   Allergy    Arthritis    Asthma    Breast cancer (HCC)    Hypercholesterolemia    Hypertension    Peripheral arterial disease    PONV (postoperative nausea and vomiting)    PUD (peptic ulcer disease)    Scleroderma (HCC)     Family History  Problem Relation Age of Onset   Cancer Father 38 - 41       unknown type   Ulcers Sister    High Cholesterol Sister    Ulcers Brother    Heart disease Brother 22       MI   Prostate cancer Maternal Uncle    Breast cancer Maternal Grandmother 50 - 69   Heart disease Maternal Grandfather    Cancer Cousin        unknown types, maternal first cousins   Past Surgical History:  Procedure Laterality Date   arthroscopy     BREAST BIOPSY Right 09/07/2023   Ductal carcinoma in situ, intermediate grade; Calcifications present; 0.1 cm DCIS length.   BREAST LUMPECTOMY WITH RADIOACTIVE SEED LOCALIZATION Right 10/21/2023   Procedure: RIGHT BREAST SEED GUIDED LUMPECTOMY;  Surgeon: Ebbie Cough, MD;  Location: [facility] OR;  Service: General;  Laterality: Right;   CYST REMOVAL HAND     EYE SURGERY Left 10/25/2017   laser surgery    KNEE ARTHROPLASTY     Social History   Tobacco Use   Smoking status: Never    Passive exposure: Never   Smokeless tobacco: Never  Vaping Use   Vaping status: Never Used  Substance Use Topics   Alcohol use: No   Drug use: No   Social History   Social History Narrative   Not on file     Immunization History  Administered Date(s) Administered   Fluad Quad(high Dose 65+) 12/19/2020, 09/29/2021, 08/15/2022   H1N1 08/22/2009   INFLUENZA, HIGH DOSE SEASONAL PF 06/24/2015, 10/05/2016, 08/04/2018, 06/19/2024   Influenza,inj,Quad PF,6+ Mos 07/12/2013, 01/15/2015   Influenza-Unspecified 06/24/2015, 10/05/2016, 12/24/2017, 08/04/2018   PFIZER(Purple Top)SARS-COV-2 Vaccination 12/13/2019, 01/08/2020, 10/03/2020    PNEUMOCOCCAL CONJUGATE-20 06/19/2024   Pfizer Covid-19 Vaccine Bivalent Booster 18yrs & up 09/29/2021   Pfizer(Comirnaty)Fall Seasonal Vaccine 12 years and older 08/15/2022   Pneumococcal Conjugate-13 05/20/2014   Pneumococcal Polysaccharide-23 01/31/2007   Tdap 01/31/2007, 12/19/2020   Zoster, Live 07/02/2008     Objective: Vital Signs: BP 127/72   Pulse 64   Temp (!) 97.5 F (36.4 C)   Resp 14   Ht 5' 6 (1.676 m)   Wt 172 lb (78 kg)   LMP 02/16/2003   BMI 27.76 kg/m    Physical Exam Vitals and nursing note reviewed.  Constitutional:      Appearance: She is well-developed.  HENT:     Head: Normocephalic and atraumatic.  Eyes:     Conjunctiva/sclera: Conjunctivae normal.  Cardiovascular:     Rate and Rhythm: Normal rate and regular rhythm.     Heart sounds: Normal heart sounds.  Pulmonary:     Effort: Pulmonary effort is normal.     Breath  sounds: Normal breath sounds.  Abdominal:     General: Bowel sounds are normal.     Palpations: Abdomen is soft.  Musculoskeletal:     Cervical back: Normal range of motion.  Lymphadenopathy:     Cervical: No cervical adenopathy.  Skin:    General: Skin is warm and dry.     Capillary Refill: Capillary refill takes less than 2 seconds.  Neurological:     Mental Status: She is alert and oriented to person, place, and time.  Psychiatric:        Behavior: Behavior normal.      Musculoskeletal Exam: C-spine, thoracic spine, lumbar spine have good range of motion.  Shoulder joints, elbow joints, wrist joints, MCPs, PIPs, DIPs have good range of motion with no synovitis.  Hip joints have good range of motion with no groin pain.  Limited extension of both knees with valgus deformity bilaterally.  No warmth or effusion of knee joints noted.  No tenderness or swelling of ankle joints.  CDAI Exam: CDAI Score: -- Patient Global: --; Provider Global: -- Swollen: --; Tender: -- Joint Exam 08/23/2024   No joint exam has been documented  for this visit   There is currently no information documented on the homunculus. Go to the Rheumatology activity and complete the homunculus joint exam.  Investigation: No additional findings.  Imaging: ECHOCARDIOGRAM COMPLETE Result Date: 07/30/2024    ECHOCARDIOGRAM REPORT   Patient Name:   Rhonda  NEELA Buchanan Date of Exam: 07/30/2024 Medical Rec #:  [id number]         Height:       66.5 in Accession #:    [id number]        Weight:       168.2 lb Date of Birth:  Jan 05, 1946         BSA:          1.868 m Patient Age:    78 years          BP:           95/59 mmHg Patient Gender: F                 HR:           59 bpm. Exam Location:  Inpatient Procedure: 2D Echo and Strain Analysis (Both Spectral and Color Flow Doppler            were utilized during procedure). Indications:    Congestive Heart failure, Scleroderma  History:        Patient has prior history of Echocardiogram examinations. Risk                 Factors:Hypertension.  Sonographer:    Charmaine Gaskins Referring Phys: [id number] DANIEL R BENSIMHON IMPRESSIONS  1. Fixed noncoronary cusp; mild AS with mean gradient 10 mm Hg.  2. Left ventricular ejection fraction, by estimation, is 60 to 65%. The left ventricle has normal function. The left ventricle has no regional wall motion abnormalities. There is mild concentric left ventricular hypertrophy. Left ventricular diastolic parameters are consistent with Grade I diastolic dysfunction (impaired relaxation). The average left ventricular global longitudinal strain is -18.0 %. The global longitudinal strain is normal.  3. Right ventricular systolic function is normal. The right ventricular size is normal.  4. The mitral valve is normal in structure. No evidence of mitral valve regurgitation. No evidence of mitral stenosis.  5. The aortic valve is normal in structure. Aortic valve regurgitation is not visualized. Mild aortic  valve stenosis.  6. The inferior vena cava is normal in size with greater than 50%  respiratory variability, suggesting right atrial pressure of 3 mmHg. FINDINGS  Left Ventricle: Left ventricular ejection fraction, by estimation, is 60 to 65%. The left ventricle has normal function. The left ventricle has no regional wall motion abnormalities. The average left ventricular global longitudinal strain is -18.0 %. Strain was performed and the global longitudinal strain is normal. The left ventricular internal cavity size was normal in size. There is mild concentric left ventricular hypertrophy. Left ventricular diastolic parameters are consistent with Grade I diastolic dysfunction (impaired relaxation). Right Ventricle: The right ventricular size is normal. No increase in right ventricular wall thickness. Right ventricular systolic function is normal. Left Atrium: Left atrial size was normal in size. Right Atrium: Right atrial size was normal in size. Pericardium: There is no evidence of pericardial effusion. Mitral Valve: The mitral valve is normal in structure. No evidence of mitral valve regurgitation. No evidence of mitral valve stenosis. Tricuspid Valve: The tricuspid valve is normal in structure. Tricuspid valve regurgitation is mild . No evidence of tricuspid stenosis. Aortic Valve: The aortic valve is normal in structure. Aortic valve regurgitation is not visualized. Mild aortic stenosis is present. Aortic valve mean gradient measures 9.5 mmHg. Aortic valve peak gradient measures 20.2 mmHg. Aortic valve area, by VTI measures 1.81 cm. Pulmonic Valve: The pulmonic valve was normal in structure. Pulmonic valve regurgitation is trivial. No evidence of pulmonic stenosis. Aorta: The aortic root is normal in size and structure. Venous: The inferior vena cava is normal in size with greater than 50% respiratory variability, suggesting right atrial pressure of 3 mmHg. IAS/Shunts: No atrial level shunt detected by color flow Doppler. Additional Comments: Fixed noncoronary cusp; mild AS with mean gradient  10 mm Hg.  LEFT VENTRICLE PLAX 2D LVIDd:         3.50 cm     Diastology LVIDs:         1.90 cm     LV e' medial:    6.20 cm/s LV PW:         1.10 cm     LV E/e' medial:  9.5 LV IVS:        1.20 cm     LV e' lateral:   8.27 cm/s LVOT diam:     2.00 cm     LV E/e' lateral: 7.2 LV SV:         80 LV SV Index:   43          2D Longitudinal Strain LVOT Area:     3.14 cm    2D Strain GLS Avg:     -18.0 %  LV Volumes (MOD) LV vol d, MOD A2C: 35.8 ml LV vol d, MOD A4C: 37.2 ml LV vol s, MOD A2C: 10.7 ml LV vol s, MOD A4C: 11.3 ml LV SV MOD A2C:     25.1 ml LV SV MOD A4C:     37.2 ml LV SV MOD BP:      25.8 ml RIGHT VENTRICLE RV Basal diam:  2.90 cm RV Mid diam:    2.40 cm TAPSE (M-mode): 2.0 cm LEFT ATRIUM             Index        RIGHT ATRIUM           Index LA diam:        3.10 cm 1.66 cm/m   RA Area:  11.50 cm LA Vol (A2C):   34.2 ml 18.30 ml/m  RA Volume:   25.00 ml  13.38 ml/m LA Vol (A4C):   50.1 ml 26.82 ml/m LA Biplane Vol: 41.6 ml 22.27 ml/m  AORTIC VALVE AV Area (Vmax):    1.64 cm AV Area (Vmean):   1.90 cm AV Area (VTI):     1.81 cm AV Vmax:           224.50 cm/s AV Vmean:          139.000 cm/s AV VTI:            0.444 m AV Peak Grad:      20.2 mmHg AV Mean Grad:      9.5 mmHg LVOT Vmax:         117.00 cm/s LVOT Vmean:        83.900 cm/s LVOT VTI:          0.256 m LVOT/AV VTI ratio: 0.58  AORTA Ao Root diam: 2.70 cm Ao Asc diam:  2.80 cm MITRAL VALVE               TRICUSPID VALVE MV Area (PHT): 2.76 cm    TR Peak grad:   21.2 mmHg MV Decel Time: 275 msec    TR Vmax:        230.00 cm/s MV E velocity: 59.20 cm/s MV A velocity: 79.10 cm/s  SHUNTS MV E/A ratio:  0.75        Systemic VTI:  0.26 m                            Systemic Diam: 2.00 cm Redell Shallow MD Electronically signed by Redell Shallow MD Signature Date/Time: 07/30/2024/9:53:24 AM    Final     Recent Labs: Lab Results  Component Value Date   WBC 3.9 (L) 03/14/2024   HGB 13.0 03/14/2024   PLT 191 03/14/2024   NA 142 08/14/2024    K 4.2 08/14/2024   CL 109 08/14/2024   CO2 28 08/14/2024   GLUCOSE 95 08/14/2024   BUN 12 08/14/2024   CREATININE 1.26 (H) 08/14/2024   BILITOT 0.7 03/14/2024   ALKPHOS 98 03/14/2024   AST 32 03/14/2024   ALT 29 03/14/2024   PROT 7.2 03/14/2024   ALBUMIN 4.0 03/14/2024   CALCIUM  9.9 08/14/2024   GFRAA 62 08/01/2020    Speciality Comments: No specialty comments available.  Procedures:  No procedures performed Allergies: Aspirin , Ibuprofen, and Oxycodone     Assessment / Plan:     Visit Diagnoses: Scleroderma (HCC) - History of morphea, Raynauds, recurrent iridocyclitis.  She is followed by Dr. Paulita, [facility] dermatology for morphea: No signs of sclerodactyly noted on examination today.  No nailbed capillary changes noted.  No telangiectasias noted. Her fingertips were cool to touch but no digital ulcerations or signs of gangrene were noted. Morphea-stable. She was evaluated by Dr. Paulita on 07/23/2024--recommended continuing methotrexate 5 tablets weekly and folic acid  3 mg daily. No symptoms of dysphagia. No new or worsening pulmonary symptoms.  Her lungs were clear to auscultation today. Discussed the importance of close blood pressure monitoring due to risk for renal cell crisis.  Patient will be following up with her PCP to discuss her antihypertensive medication regimen. Evaluated by Dr. Colen office visit note from 08/14/24.  High resolution chest CT on 05/03/24: Benjamine appearing, bandlike scarring of the bilateral lung bases with elevation of the diaphragms, right-greater-than-left. No  evidence of fibrotic interstitial lung disease. Evaluated by Dr. Cherrie 07/30/24: Echocardiogram updated on--07/30/24. Patient was advised to notify us  if she develops any new or worsening symptoms.  She will follow-up in the office in 6  months or sooner if needed.  Morphea scleroderma - Followed at [facility] dermatology by Dr. Harlo office visit note from 07/23/2024-recommended  continuing methotrexate 5 tablets weekly.  High risk medication use - Methotrexate 5 tablets by mouth once weekly and folic acid  3 mg p.o. daily--prescribed by Dr. Paulita.  BMP updated on 08/14/24. Planning to update lab work on 09/05/24.  Future orders were placed today. No recent or recurrent infections.  Discussed the importance of holding methotrexate if she develops signs or symptoms of an infection and to resume once the infection has completely cleared. - Plan: CBC with Differential/Platelet, Hepatic function panel  Positive PPD, treated  Raynaud's syndrome without gangrene: Intermittent symptoms of Raynaud's phenomenon.  No signs of sclerodactyly.  No digital ulcerations noted.  Recurrent acute iridocyclitis of both eyes - Evaluated by Dr. Maree.  No recent flares.  Taking methotrexate 5 tablets once weekly.  Primary osteoarthritis of both hands: No tenderness or synovitis noted.  Primary osteoarthritis of both knees: Limited extension of both knees with valgus deformity bilaterally.  No warmth or effusion noted.  Primary osteoarthritis of both feet: She has good range of motion of both ankle joints with no tenderness or joint swelling.  Bilateral calcaneal spurs: She is not experiencing any increased discomfort in her feet at this time.  She is wearing proper fitting shoes.  Other medical conditions are listed as follows:  Ductal carcinoma in situ (DCIS) of right breast: S/p lumpectomy.  She is taking anastrozole . Followed by Dr. Loretha.   History of hyperlipidemia  History of hypertension - BP 127/72 today in the office.  Discussed the importance of close blood pressure monitoring.  Discussed the increased risk for developing renal cell crisis in patients with scleroderma.  Patient will be following up with her PCP to discuss her antihypertensive medications.  History of migraine  Orders: Orders Placed This Encounter  Procedures   CBC with Differential/Platelet   Hepatic  function panel   No orders of the defined types were placed in this encounter.    Follow-Up Instructions: Return in about 6 months (around 02/20/2025).   Waddell CHRISTELLA Craze, PA-C  Note - This record has been created using Dragon software.  Chart creation errors have been sought, but may not always  have been located. Such creation errors do not reflect on  the standard of medical care.

## 2024-08-14 ENCOUNTER — Ambulatory Visit: Admitting: Internal Medicine

## 2024-08-14 ENCOUNTER — Ambulatory Visit: Payer: Self-pay | Admitting: Internal Medicine

## 2024-08-14 ENCOUNTER — Encounter: Payer: Self-pay | Admitting: Internal Medicine

## 2024-08-14 VITALS — BP 157/78 | HR 66 | Temp 97.7°F | Ht 66.0 in | Wt 172.0 lb

## 2024-08-14 DIAGNOSIS — R0609 Other forms of dyspnea: Secondary | ICD-10-CM

## 2024-08-14 DIAGNOSIS — I10 Essential (primary) hypertension: Secondary | ICD-10-CM | POA: Diagnosis not present

## 2024-08-14 LAB — BASIC METABOLIC PANEL WITH GFR
BUN: 12 mg/dL (ref 6–23)
CO2: 28 meq/L (ref 19–32)
Calcium: 9.9 mg/dL (ref 8.4–10.5)
Chloride: 109 meq/L (ref 96–112)
Creatinine, Ser: 1.26 mg/dL — ABNORMAL HIGH (ref 0.40–1.20)
GFR: 40.52 mL/min — ABNORMAL LOW (ref >60.00)
Glucose, Bld: 95 mg/dL (ref 70–99)
Potassium: 4.2 meq/L (ref 3.5–5.1)
Sodium: 142 meq/L (ref 135–145)

## 2024-08-14 NOTE — Assessment & Plan Note (Addendum)
 D/c ACEi 08/14/2024 due to unexplained doe and upper airway cough pattern  - resolved to pt's satisfaction 08/14/2024 > f/u PCP/ Cards  Lab Results  Component Value Date   CREATININE 1.26 (H) 08/14/2024   CREATININE 1.29 (H) 03/14/2024   CREATININE 1.42 (H) 12/21/2023     K ok on just 20 meq  daily   Pulmonary clinic  f/u prn      Each maintenance medication was reviewed in detail including emphasizing most importantly the difference between maintenance and prns and under what circumstances the prns are to be triggered using an action plan format where appropriate.  Total time for H and P, chart review, counseling,   and generating customized AVS unique to this office visit / same day charting = 30 min summary final f/u ov

## 2024-08-14 NOTE — Patient Instructions (Addendum)
 Please remember to go to the lab department   for your tests - we will call you with the results when they are available.   Further adjustments on your meds should be thru PCP    If you are satisfied with your treatment plan,  let your doctor know and he/she can either refill your medications or you can return here when your prescription runs out.     If in any way you are not 100% satisfied,  please tell us .  If 100% better, tell your friends!  Pulmonary follow up is as needed

## 2024-08-14 NOTE — Progress Notes (Signed)
 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 Sleep: bed is flat one pillow on side 1130 - 9 am but doesn't feel rested  SABA use: none  02 ldz:wnwz  Rec Stop lisinopril   and start olmesartan  20-12.5 one daily in place of lisinopril   Mucinex 1200 mg every 12 hours as needed for thick mucus     HRCT 05/03/24 no ILD     08/14/2024 3 m f/u ov/Rhonda Buchanan re: ? Acei case  maint on no resp rx  f/b [name]  Chief Complaint  Patient presents with   Shortness of Breath    Patient states her breathing is okay   Dyspnea:  improved  Cough: improved/ swallowing   Sleeping: bed is flat one pillow s  resp cc  SABA use: none  02: none      No obvious day to day or daytime variability or assoc excess/ purulent sputum or mucus plugs or hemoptysis or cp or chest tightness, subjective wheeze or overt sinus or hb symptoms.    Also denies any obvious fluctuation of symptoms with weather or environmental changes or other aggravating or alleviating factors except as outlined above   No unusual exposure hx or h/o childhood pna/ asthma or knowledge of premature birth.  Current Allergies, Complete Past Medical History, Past Surgical History, Family History, and Social History were reviewed in [employer] record.  ROS  The following are not active complaints unless  bolded Hoarseness, sore throat, dysphagia, dental problems, itching, sneezing,  nasal congestion or discharge of excess mucus or purulent secretions, ear ache,   fever, chills, sweats, unintended wt loss or wt gain, classically pleuritic or exertional cp,  orthopnea pnd or arm/hand swelling  or leg swelling, presyncope, palpitations, abdominal pain, anorexia, nausea, vomiting, diarrhea  or change in bowel habits or change in bladder habits, change in stools or change in urine, dysuria, hematuria,  rash, arthralgias, visual complaints, headache, numbness, weakness or ataxia or problems with walking or coordination,  change in mood or  memory.        Current Meds  Medication Sig   anastrozole  (ARIMIDEX ) 1 MG tablet Take 1 tablet (1 mg total) by mouth daily.   Brinzolamide-Brimonidine 1-0.2 % SUSP Place 1 drop into both eyes 3 (three) times daily.   folic acid  (FOLVITE ) 1 MG tablet Take 3 mg by mouth daily. Hold on the day when  takes Methotrexate   methotrexate (RHEUMATREX) 2.5 MG tablet Take 12.5 mg by mouth once a week. Caution:Chemotherapy. Protect from light.   metoprolol  succinate (TOPROL -XL) 50 MG 24 hr tablet TAKE 1/2 TABLET BY MOUTH DAILY. TAKE WITH OR IMMEDIATELY FOLLOWING A MEAL.   olmesartan  (BENICAR ) 20 MG tablet Take 0.5 tablets (10 mg total)  by mouth daily.   Polyethyl Glycol-Propyl Glycol (SYSTANE OP) Apply to eye.   potassium chloride  SA (KLOR-CON  M) 20 MEQ tablet Take 20 mEq by mouth 2 (two) times daily.   prednisoLONE acetate (PRED FORTE) 1 % ophthalmic suspension Place 1 drop into both eyes daily.    rosuvastatin  (CRESTOR ) 40 MG tablet Take 1 tablet (40 mg total) by mouth daily.   timolol (TIMOPTIC) 0.5 % ophthalmic solution Place 1 drop into both eyes every morning.            Past Medical History:  Diagnosis Date   Allergy    Arthritis    Asthma    Breast cancer (HCC)    Hypercholesterolemia    Hypertension    Peripheral arterial disease (HCC)    PONV (postoperative  nausea and vomiting)    PUD (peptic ulcer disease)    Scleroderma (HCC)       Objective:    Wt Readings from Last 3 Encounters:  08/14/24 172 lb (78 kg)  07/30/24 173 lb 12.8 oz (78.8 kg)  06/19/24 168 lb 3.2 oz (76.3 kg)      Vital signs reviewed  08/14/2024  - Note at rest 02 sats  96% on RA   General appearance:   somber amb bf nad    HEENT : Oropharynx  clar      Nasal turbinates nl    NECK :  without  apparent JVD/ palpable Nodes/TM    LUNGS: no acc muscle use,  Nl contour chest which is clear to A and P bilaterally without cough on insp or exp maneuvers   CV:  RRR  no s3 or murmur or increase in P2, and no edema   ABD:  soft and nontender   MS:  Gait nl   ext warm without deformities Or obvious joint restrictions  calf tenderness, cyanosis or clubbing    SKIN: warm and dry without lesions    NEURO:  alert, approp, nl sensorium with  no motor or cerebellar deficits apparent.       Labs ordered/ reviewed:      Chemistry      Component Value Date/Time   NA 142 08/14/2024 1139   NA 141 04/26/2023 1030   K 4.2 08/14/2024 1139   CL 109 08/14/2024 1139   CO2 28 08/14/2024 1139   BUN 12 08/14/2024 1139   BUN 12 04/26/2023 1030   CREATININE 1.26 (H) 08/14/2024 1139   CREATININE 1.29 (H) 03/14/2024 1011   CREATININE 1.12 (H) 10/19/2022 1518      Component Value Date/Time   CALCIUM  9.9 08/14/2024 1139   ALKPHOS 98 03/14/2024 1011   AST 32 03/14/2024 1011   ALT 29 03/14/2024 1011   BILITOT 0.7 03/14/2024 1011       Lab Results  Component Value Date   CREATININE 1.26 (H) 08/14/2024   CREATININE 1.29 (H) 03/14/2024   CREATININE 1.42 (H) 12/21/2023      Assessment   Assessment & Plan DOE (dyspnea on exertion) Never smoker Dx with RA/scleroderma  in her 34s  - Echo 09/13/23  nl   - PFTs  09/13/23  FVC 2.41 (81%)  s obst and DLCO  13.242 (66%) vs 14.8 (72%) on 08/12/22  - 04/27/2024   Walked on RA  x  1  lap(s) =  approx 250  ft  @  mod  pace,  stopped due lowest 02 sats 87% c/o knee pain and declined 02 - d/c acei 05/07/14  -  HRCT   05/03/24  no ILD   Much less doe but some fatigue now but not evidence of ILD in this setting is a good sign and we can see her back in pulmonary clinic prn  Essential hypertension D/c ACEi 08/14/2024 due to unexplained doe and upper airway cough pattern  - resolved to pt's satisfaction 08/14/2024 > f/u PCP/ Cards  Lab Results  Component Value Date   CREATININE 1.26 (H) 08/14/2024   CREATININE 1.29 (H) 03/14/2024   CREATININE 1.42 (H) 12/21/2023     K ok on just 20 meq  daily   Pulmonary clinic  f/u prn      Each maintenance medication was reviewed in detail including emphasizing most importantly the difference between maintenance and prns and under what circumstances the prns are to be triggered using an action plan format where appropriate.  Total time for H and P, chart review, counseling,   and generating customized AVS unique to this office visit / same day charting = 30 min summary final f/u ov          AVS  Patient Instructions   Please remember to go to the lab department   for your tests - we will call you with the results when they are available.   Further adjustments on your meds should be thru PCP    If you are satisfied with your treatment plan,  let your doctor know and he/she can either refill your medications or you can return here when your prescription runs out.     If in any way you are not 100% satisfied,  please tell us .  If 100% better, tell your friends!  Pulmonary follow up is as needed         Ozell America, MD 08/14/2024

## 2024-08-14 NOTE — Assessment & Plan Note (Addendum)
 Never smoker Dx with RA/scleroderma  in her 29s  - Echo 09/13/23  nl   - PFTs  09/13/23  FVC 2.41 (81%)  s obst and DLCO  13.242 (66%) vs 14.8 (72%) on 08/12/22  - 04/27/2024   Walked on RA  x  1  lap(s) =  approx 250  ft  @  mod  pace, stopped due lowest 02 sats 87% c/o knee pain and declined 02 - d/c acei 05/07/14  - HRCT   05/03/24  no ILD   Much less doe but some fatigue now but not evidence of ILD in this setting is a good sign and we can see her back in pulmonary clinic prn

## 2024-08-23 ENCOUNTER — Encounter: Payer: Self-pay | Admitting: Physician Assistant

## 2024-08-23 ENCOUNTER — Ambulatory Visit: Payer: Self-pay

## 2024-08-23 ENCOUNTER — Ambulatory Visit: Attending: Physician Assistant | Admitting: Physician Assistant

## 2024-08-23 VITALS — BP 127/72 | HR 64 | Temp 97.5°F | Resp 14 | Ht 66.0 in | Wt 172.0 lb

## 2024-08-23 DIAGNOSIS — M7732 Calcaneal spur, left foot: Secondary | ICD-10-CM

## 2024-08-23 DIAGNOSIS — M19041 Primary osteoarthritis, right hand: Secondary | ICD-10-CM

## 2024-08-23 DIAGNOSIS — D0511 Intraductal carcinoma in situ of right breast: Secondary | ICD-10-CM

## 2024-08-23 DIAGNOSIS — R7611 Nonspecific reaction to tuberculin skin test without active tuberculosis: Secondary | ICD-10-CM

## 2024-08-23 DIAGNOSIS — M349 Systemic sclerosis, unspecified: Secondary | ICD-10-CM

## 2024-08-23 DIAGNOSIS — H20023 Recurrent acute iridocyclitis, bilateral: Secondary | ICD-10-CM

## 2024-08-23 DIAGNOSIS — M7731 Calcaneal spur, right foot: Secondary | ICD-10-CM

## 2024-08-23 DIAGNOSIS — Z79899 Other long term (current) drug therapy: Secondary | ICD-10-CM | POA: Diagnosis not present

## 2024-08-23 DIAGNOSIS — Z8679 Personal history of other diseases of the circulatory system: Secondary | ICD-10-CM

## 2024-08-23 DIAGNOSIS — L940 Localized scleroderma [morphea]: Secondary | ICD-10-CM

## 2024-08-23 DIAGNOSIS — M19071 Primary osteoarthritis, right ankle and foot: Secondary | ICD-10-CM

## 2024-08-23 DIAGNOSIS — Z8669 Personal history of other diseases of the nervous system and sense organs: Secondary | ICD-10-CM

## 2024-08-23 DIAGNOSIS — M19042 Primary osteoarthritis, left hand: Secondary | ICD-10-CM

## 2024-08-23 DIAGNOSIS — Z8639 Personal history of other endocrine, nutritional and metabolic disease: Secondary | ICD-10-CM

## 2024-08-23 DIAGNOSIS — M170 Bilateral primary osteoarthritis of knee: Secondary | ICD-10-CM

## 2024-08-23 DIAGNOSIS — I7300 Raynaud's syndrome without gangrene: Secondary | ICD-10-CM

## 2024-08-23 DIAGNOSIS — M19072 Primary osteoarthritis, left ankle and foot: Secondary | ICD-10-CM

## 2024-08-23 NOTE — Telephone Encounter (Signed)
 FYI Only or Action Required?: Action required by provider: clinical question for provider.  Patient was last seen in primary care on 06/19/2024 by Joyce Norleen BROCKS, MD.  Called Nurse Triage reporting Medication Problem.  Symptoms began several weeks ago.  Interventions attempted: Prescription medications: BP meds.  Symptoms are: unchanged.  Triage Disposition: Call PCP Now  Patient/caregiver understands and will follow disposition?: Yes     Copied from CRM [id number]. Topic: Clinical - Red Word Triage >> Aug 23, 2024  3:53 PM Charlet HERO wrote: Red Word that prompted transfer to Nurse Triage: Patient is calling about the changes to her b/p med she is stating that she got a 85month supply and he informed her that if there is a issue with it to call in and she is stating that it has her feet and ankle swelling. The med is olmesartan  Reason for Disposition  [1] Caller has URGENT medicine question about med that primary care doctor (or NP/PA) or specialist prescribed AND [2] triager unable to answer question  Answer Assessment - Initial Assessment Questions BP has also been up and down. Pt denies any chest pain, dizziness, headache, or SOB currently. Patient requesting to speak with PCP regarding her medications. Patient requesting either a call back or Mychart medication regarding her request.      1. NAME of MEDICINE: What medicine(s) are you calling about?    Olmesartan    2. QUESTION: What is your question? (e.g., double dose of medicine, side effect)     Side effect  3. PRESCRIBER: Who prescribed the medicine? Reason: if prescribed by specialist, call should be referred to that group.     PCP 4. SYMPTOMS: Do you have any symptoms? If Yes, ask: What symptoms are you having?  How bad are the symptoms (e.g., mild, moderate, severe)     Feet and ankle swelling is mild  Protocols used: Medication Question Call-A-AH

## 2024-08-24 NOTE — Telephone Encounter (Signed)
 Patient scheduled for 08/28/24

## 2024-08-28 ENCOUNTER — Ambulatory Visit: Admitting: Family Medicine

## 2024-08-28 ENCOUNTER — Encounter: Payer: Self-pay | Admitting: Family Medicine

## 2024-08-28 VITALS — BP 160/80 | HR 60 | Ht 66.0 in | Wt 171.4 lb

## 2024-08-28 DIAGNOSIS — I10 Essential (primary) hypertension: Secondary | ICD-10-CM | POA: Diagnosis not present

## 2024-08-28 DIAGNOSIS — Z23 Encounter for immunization: Secondary | ICD-10-CM

## 2024-08-28 DIAGNOSIS — R609 Edema, unspecified: Secondary | ICD-10-CM

## 2024-08-28 MED ORDER — OLMESARTAN MEDOXOMIL 20 MG PO TABS: 20.0000 mg | ORAL_TABLET | Freq: Every day | ORAL | 3 refills | Status: AC

## 2024-08-28 NOTE — Progress Notes (Signed)
   Subjective:    Patient ID: Rhonda  CHRISTELLA Buchanan, female    DOB: 20-Feb-1946, 78 y.o.   MRN: [id number]  HPI She is here for consult concerning her blood pressure.  She reports that she gets multiple blood pressure readings at various offices and they can be quite variable.  She does follow-up regularly with Dr. Bensimhon.  Presently she is taking 20 mg of Benicar  and Toprol  25 mg and her blood pressures at home on average are around 130/60.  In the past she was on a medication that also had a diuretic in it but has stopped that and has noted some swelling in her feet but they usually occur later on in the day and do tend to go away in the morning.  She is not having any PND or DOE.   Review of Systems     Objective:    Physical Exam Alert and in no distress.  Blood pressure is recorded.       Assessment & Plan:  Primary hypertension - Plan: olmesartan  (BENICAR ) 20 MG tablet  Need for COVID-19 vaccine - Plan: Pfizer Comirnaty Covid-19 Vaccine 21yrs & older  Dependent edema  I explained that the spine she having in her feet is called dependent edema and is more gravity related and does tend to occur more as we get older.  I explained that I did not think it was necessary to treat that since she is really not having a lot of trouble with that. Then discussed the fact that the blood pressure she is getting at home is the most accurate 1 and do not be overly concerned if she gets multiple blood pressure readings at various offices and explained to them that she is being followed based on her blood pressure readings at home.

## 2024-09-04 ENCOUNTER — Telehealth: Payer: Self-pay

## 2024-09-04 NOTE — Telephone Encounter (Signed)
 Spoke with patient and confirmed appointment on 11/19

## 2024-09-05 ENCOUNTER — Inpatient Hospital Stay: Attending: Hematology and Oncology | Admitting: Hematology and Oncology

## 2024-09-05 VITALS — BP 152/54 | HR 64 | Temp 97.5°F | Resp 17 | Wt 171.5 lb

## 2024-09-05 DIAGNOSIS — Z803 Family history of malignant neoplasm of breast: Secondary | ICD-10-CM | POA: Insufficient documentation

## 2024-09-05 DIAGNOSIS — Z79811 Long term (current) use of aromatase inhibitors: Secondary | ICD-10-CM | POA: Diagnosis not present

## 2024-09-05 DIAGNOSIS — D0511 Intraductal carcinoma in situ of right breast: Secondary | ICD-10-CM | POA: Insufficient documentation

## 2024-09-05 DIAGNOSIS — Z809 Family history of malignant neoplasm, unspecified: Secondary | ICD-10-CM | POA: Insufficient documentation

## 2024-09-05 DIAGNOSIS — Z170 Estrogen receptor positive status [ER+]: Secondary | ICD-10-CM | POA: Diagnosis not present

## 2024-09-05 DIAGNOSIS — M8580 Other specified disorders of bone density and structure, unspecified site: Secondary | ICD-10-CM | POA: Diagnosis not present

## 2024-09-05 NOTE — Progress Notes (Signed)
 Rhonda Buchanan CONSULT NOTE  Patient Care Team: Joyce Norleen BROCKS, MD as PCP - General (Family Medicine) Bensimhon, Toribio SAUNDERS, MD as PCP - Cardiology (Cardiology) Ebbie Cough, MD as Consulting Physician (General Surgery) Loretha Ash, MD as Consulting Physician (Hematology and Oncology) Dewey Norleen, MD as Consulting Physician (Radiation Oncology) Dewey Norleen, MD as Consulting Physician (Radiation Oncology)  CHIEF COMPLAINTS/PURPOSE OF CONSULTATION:  Newly diagnosed breast cancer  HISTORY OF PRESENTING ILLNESS:  Rhonda  M Buchanan 78 y.o. female is here because of recent diagnosis of right breast DCIS  I reviewed her records extensively and collaborated the history with the patient.  SUMMARY OF ONCOLOGIC HISTORY: Oncology History  Ductal carcinoma in situ (DCIS) of right breast  08/04/2023 Mammogram   She had a screening mammogram in October 2024 which showed indeterminate right breast calcs, diagnostic mammogram was recommended, diagnostic mammogram also showed grouped linear pleomorphic calcs in the right breast suspicious of malignancy.  Stereotactic biopsy was recommended.   09/07/2023 Pathology Results   Right breast needle core biopsy at 12:00 middle depth showed intermediate grade DCIS, prognostic showed 90% positive ER strong staining, 40% positive PR weak staining   09/21/2023 Cancer Staging   Staging form: Breast, AJCC 8th Edition - Clinical stage from 09/21/2023: Stage 0 (cTis (DCIS), cN0, cM0, ER+, PR+) - Signed by Loretha Ash, MD on 09/21/2023 Stage prefix: Initial diagnosis Nuclear grade: G2    Genetic Testing   [facility] CancerNext-Expanded Panel+RNA was Negative. Report date is 10/10/2023.   The CancerNext-Expanded gene panel offered by [facility] and includes sequencing, rearrangement, and RNA analysis for the following 76 genes: AIP, ALK, APC, ATM, AXIN2, BAP1, BARD1, BMPR1A, BRCA1, BRCA2, BRIP1, CDC73, CDH1, CDK4, CDKN1B, CDKN2A, CEBPA, CHEK2,  CTNNA1, DDX41, DICER1, ETV6, FH, FLCN, GATA2, LZTR1, MAX, MBD4, MEN1, MET, MLH1, MSH2, MSH3, MSH6, MUTYH, NF1, NF2, NTHL1, PALB2, PHOX2B, PMS2, POT1, PRKAR1A, PTCH1, PTEN, RAD51C, RAD51D, RB1, RET, RUNX1, SDHA, SDHAF2, SDHB, SDHC, SDHD, SMAD4, SMARCA4, SMARCB1, SMARCE1, STK11, SUFU, TMEM127, TP53, TSC1, TSC2, VHL, and WT1 (sequencing and deletion/duplication); EGFR, HOXB13, KIT, MITF, PDGFRA, POLD1, and POLE (sequencing only); EPCAM and GREM1 (deletion/duplication only).     10/21/2023 Surgery   Right breast lumpectomy, DCIS, intermediate to high grade, 1.5 cm, margins negative, no invasive carcinoma noted.   11/2023 -  Anti-estrogen oral therapy   Anastrozole     This is a very pleasant 78 year old female patient with newly diagnosed right breast DCIS arrived with her daughter to the breast [facility]. She is healthy at baseline, has underlying dyslipidemia and scleroderma.  She has been on methotrexate for several years.    Discussed the use of AI scribe software for clinical note transcription with the patient, who gave verbal consent to proceed.  History of Present Illness    Rest of the pertinent 10 point ROS reviewed and negative  MEDICAL HISTORY:  Past Medical History:  Diagnosis Date   Allergy    Arthritis    Asthma    Breast cancer (HCC)    Hypercholesterolemia    Hypertension    Peripheral arterial disease    PONV (postoperative nausea and vomiting)    PUD (peptic ulcer disease)    Scleroderma (HCC)     SURGICAL HISTORY: Past Surgical History:  Procedure Laterality Date   arthroscopy     BREAST BIOPSY Right 09/07/2023   Ductal carcinoma in situ, intermediate grade; Calcifications present; 0.1 cm DCIS length.   BREAST LUMPECTOMY WITH RADIOACTIVE SEED LOCALIZATION Right 10/21/2023   Procedure: RIGHT BREAST SEED GUIDED  LUMPECTOMY;  Surgeon: Ebbie Cough, MD;  Location: [facility] OR;  Service: General;  Laterality: Right;   CYST REMOVAL HAND     EYE SURGERY Left 10/25/2017   laser  surgery    KNEE ARTHROPLASTY      SOCIAL HISTORY: Social History   Socioeconomic History   Marital status: Divorced    Spouse name: Not on file   Number of children: Not on file   Years of education: Not on file   Highest education level: Some college, no degree  Occupational History   Not on file  Tobacco Use   Smoking status: Never    Passive exposure: Never   Smokeless tobacco: Never  Vaping Use   Vaping status: Never Used  Substance and Sexual Activity   Alcohol use: No   Drug use: No   Sexual activity: Not Currently  Other Topics Concern   Not on file  Social History Narrative   Not on file   Social Drivers of Health   Financial Resource Strain: Low Risk  (06/18/2024)   Overall Financial Resource Strain (CARDIA)    Difficulty of Paying Living Expenses: Not hard at all  Food Insecurity: No Food Insecurity (06/18/2024)   Hunger Vital Sign    Worried About Running Out of Food in the Last Year: Never true    Ran Out of Food in the Last Year: Never true  Transportation Needs: No Transportation Needs (06/18/2024)   PRAPARE - Administrator, Civil Service (Medical): No    Lack of Transportation (Non-Medical): No  Physical Activity: Insufficiently Active (06/18/2024)   Exercise Vital Sign    Days of Exercise per Week: 3 days    Minutes of Exercise per Session: 20 min  Stress: No Stress Concern Present (06/18/2024)   [employer] of Occupational Health - Occupational Stress Questionnaire    Feeling of Stress: Only a little  Social Connections: Moderately Isolated (06/18/2024)   Social Connection and Isolation Panel    Frequency of Communication with Friends and Family: Three times a week    Frequency of Social Gatherings with Friends and Family: More than three times a week    Attends Religious Services: 1 to 4 times per year    Active Member of [employer] or Organizations: No    Attends Engineer, Structural: Not on file    Marital Status: Divorced   Intimate Partner Violence: Not At Risk (03/23/2024)   Humiliation, Afraid, Rape, and Kick questionnaire    Fear of Current or Ex-Partner: No    Emotionally Abused: No    Physically Abused: No    Sexually Abused: No    FAMILY HISTORY: Family History  Problem Relation Age of Onset   Cancer Father 62 - 61       unknown type   Ulcers Sister    High Cholesterol Sister    Ulcers Brother    Heart disease Brother 60       MI   Prostate cancer Maternal Uncle    Breast cancer Maternal Grandmother 5 - 69   Heart disease Maternal Grandfather    Cancer Cousin        unknown types, maternal first cousins    ALLERGIES:  is allergic to aspirin , ibuprofen, and oxycodone.  MEDICATIONS:  Current Outpatient Medications  Medication Sig Dispense Refill   anastrozole  (ARIMIDEX ) 1 MG tablet Take 1 tablet (1 mg total) by mouth daily. 90 tablet 3   Brinzolamide-Brimonidine 1-0.2 % SUSP Place 1 drop into  both eyes 3 (three) times daily.     folic acid  (FOLVITE ) 1 MG tablet Take 3 mg by mouth daily. Hold on the day when  takes Methotrexate     methotrexate (RHEUMATREX) 2.5 MG tablet Take 12.5 mg by mouth once a week. Caution:Chemotherapy. Protect from light.     metoprolol  succinate (TOPROL -XL) 50 MG 24 hr tablet TAKE 1/2 TABLET BY MOUTH DAILY. TAKE WITH OR IMMEDIATELY FOLLOWING A MEAL. 45 tablet 3   olmesartan  (BENICAR ) 20 MG tablet Take 1 tablet (20 mg total) by mouth daily. 90 tablet 3   Polyethyl Glycol-Propyl Glycol (SYSTANE OP) Apply to eye.     potassium chloride  SA (KLOR-CON  M) 20 MEQ tablet Take 20 mEq by mouth 2 (two) times daily.     prednisoLONE acetate (PRED FORTE) 1 % ophthalmic suspension Place 1 drop into both eyes daily.      rosuvastatin  (CRESTOR ) 40 MG tablet Take 1 tablet (40 mg total) by mouth daily. 90 tablet 3   timolol (TIMOPTIC) 0.5 % ophthalmic solution Place 1 drop into both eyes every morning.     No current facility-administered medications for this visit.    REVIEW OF  SYSTEMS:   Constitutional: Denies fevers, chills or abnormal night sweats Eyes: Denies blurriness of vision, double vision or watery eyes Ears, nose, mouth, throat, and face: Denies mucositis or sore throat Respiratory: Denies cough, dyspnea or wheezes Cardiovascular: Denies palpitation, chest discomfort or lower extremity swelling Gastrointestinal:  Denies nausea, heartburn or change in bowel habits Skin: Denies abnormal skin rashes Lymphatics: Denies new lymphadenopathy or easy bruising Neurological:Denies numbness, tingling or new weaknesses Behavioral/Psych: Mood is stable, no new changes  Breast: Denies any palpable lumps or discharge All other systems were reviewed with the patient and are negative.  PHYSICAL EXAMINATION: ECOG PERFORMANCE STATUS: 0 - Asymptomatic  PE deferred, telephone visit.  LABORATORY DATA:  I have reviewed the data as listed Lab Results  Component Value Date   WBC 3.9 (L) 03/14/2024   HGB 13.0 03/14/2024   HCT 37.8 03/14/2024   MCV 95.0 03/14/2024   PLT 191 03/14/2024   Lab Results  Component Value Date   NA 142 08/14/2024   K 4.2 08/14/2024   CL 109 08/14/2024   CO2 28 08/14/2024    RADIOGRAPHIC STUDIES: I have personally reviewed the radiological reports and agreed with the findings in the report.  ASSESSMENT AND PLAN:  No problem-specific Assessment & Plan notes found for this encounter.   All questions were answered. The patient knows to call the clinic with any problems, questions or concerns.    Amber Stalls, MD 09/05/24

## 2024-09-05 NOTE — Progress Notes (Signed)
 BRIEF ONCOLOGIC HISTORY:   Oncology History  Ductal carcinoma in situ (DCIS) of right breast  08/04/2023 Mammogram   She had a screening mammogram in October 2024 which showed indeterminate right breast calcs, diagnostic mammogram was recommended, diagnostic mammogram also showed grouped linear pleomorphic calcs in the right breast suspicious of malignancy.  Stereotactic biopsy was recommended.   09/07/2023 Pathology Results   Right breast needle core biopsy at 12:00 middle depth showed intermediate grade DCIS, prognostic showed 90% positive ER strong staining, 40% positive PR weak staining   09/21/2023 Cancer Staging   Staging form: Breast, AJCC 8th Edition - Clinical stage from 09/21/2023: Stage 0 (cTis (DCIS), cN0, cM0, ER+, PR+) - Signed by Loretha Ash, MD on 09/21/2023 Stage prefix: Initial diagnosis Nuclear grade: G2    Genetic Testing   [facility] CancerNext-Expanded Panel+RNA was Negative. Report date is 10/10/2023.   The CancerNext-Expanded gene panel offered by [facility] and includes sequencing, rearrangement, and RNA analysis for the following 76 genes: AIP, ALK, APC, ATM, AXIN2, BAP1, BARD1, BMPR1A, BRCA1, BRCA2, BRIP1, CDC73, CDH1, CDK4, CDKN1B, CDKN2A, CEBPA, CHEK2, CTNNA1, DDX41, DICER1, ETV6, FH, FLCN, GATA2, LZTR1, MAX, MBD4, MEN1, MET, MLH1, MSH2, MSH3, MSH6, MUTYH, NF1, NF2, NTHL1, PALB2, PHOX2B, PMS2, POT1, PRKAR1A, PTCH1, PTEN, RAD51C, RAD51D, RB1, RET, RUNX1, SDHA, SDHAF2, SDHB, SDHC, SDHD, SMAD4, SMARCA4, SMARCB1, SMARCE1, STK11, SUFU, TMEM127, TP53, TSC1, TSC2, VHL, and WT1 (sequencing and deletion/duplication); EGFR, HOXB13, KIT, MITF, PDGFRA, POLD1, and POLE (sequencing only); EPCAM and GREM1 (deletion/duplication only).     10/21/2023 Surgery   Right breast lumpectomy, DCIS, intermediate to high grade, 1.5 cm, margins negative, no invasive carcinoma noted.   11/2023 -  Anti-estrogen oral therapy   Anastrozole      INTERVAL HISTORY:   Discussed the use of AI  scribe software for clinical note transcription with the patient, who gave verbal consent to proceed.  History of Present Illness Dealie  Rhonda Buchanan is a 78 year old female with breast cancer who presents for follow-up regarding her medication and blood pressure management.  She is currently taking anastrozole  for breast cancer management, administered at bedtime around eleven o'clock. She experiences hair thinning on the top of her head as a side effect but otherwise tolerates the medication well. She is concerned about potential side effects of medications based on past experiences with blood pressure medication.  She is experiencing issues with blood pressure management, with readings varying significantly between different visits. She recalls a reading of 100/50 at a visit less than two months ago and a reading of 160/100 at another visit. There is a history of adverse reactions to blood pressure medications, including swelling and discoloration of the feet.  She mentions having an earache with pain that radiates downwards, but she is unsure of the cause. No hearing issues are reported, and the pain is intermittent.  REVIEW OF SYSTEMS:  Review of Systems  Constitutional:  Negative for appetite change, chills, fatigue, fever and unexpected weight change.  HENT:   Negative for hearing loss, lump/mass and trouble swallowing.   Eyes:  Negative for eye problems and icterus.  Respiratory:  Negative for chest tightness, cough and shortness of breath.   Cardiovascular:  Negative for chest pain, leg swelling and palpitations.  Gastrointestinal:  Negative for abdominal distention, abdominal pain, constipation, diarrhea, nausea and vomiting.  Endocrine: Negative for hot flashes.  Genitourinary:  Negative for difficulty urinating.   Musculoskeletal:  Negative for arthralgias.  Skin:  Negative for itching and rash.  Neurological:  Negative  for dizziness, extremity weakness, headaches and numbness.   Hematological:  Negative for adenopathy. Does not bruise/bleed easily.  Psychiatric/Behavioral:  Negative for depression. The patient is not nervous/anxious.   Breast: Denies any new nodularity, masses, tenderness, nipple changes, or nipple discharge.       PAST MEDICAL/SURGICAL HISTORY:  Past Medical History:  Diagnosis Date   Allergy    Arthritis    Asthma    Breast cancer (HCC)    Hypercholesterolemia    Hypertension    Peripheral arterial disease    PONV (postoperative nausea and vomiting)    PUD (peptic ulcer disease)    Scleroderma (HCC)    Past Surgical History:  Procedure Laterality Date   arthroscopy     BREAST BIOPSY Right 09/07/2023   Ductal carcinoma in situ, intermediate grade; Calcifications present; 0.1 cm DCIS length.   BREAST LUMPECTOMY WITH RADIOACTIVE SEED LOCALIZATION Right 10/21/2023   Procedure: RIGHT BREAST SEED GUIDED LUMPECTOMY;  Surgeon: Ebbie Cough, MD;  Location: [facility] OR;  Service: General;  Laterality: Right;   CYST REMOVAL HAND     EYE SURGERY Left 10/25/2017   laser surgery    KNEE ARTHROPLASTY       ALLERGIES:  Allergies  Allergen Reactions   Aspirin  Nausea Only   Ibuprofen Nausea Only    GI upset   Oxycodone Other (See Comments)    Gi- upset     CURRENT MEDICATIONS:  Outpatient Encounter Medications as of 09/05/2024  Medication Sig Note   anastrozole  (ARIMIDEX ) 1 MG tablet Take 1 tablet (1 mg total) by mouth daily.    Brinzolamide-Brimonidine 1-0.2 % SUSP Place 1 drop into both eyes 3 (three) times daily.    folic acid  (FOLVITE ) 1 MG tablet Take 3 mg by mouth daily. Hold on the day when  takes Methotrexate    methotrexate (RHEUMATREX) 2.5 MG tablet Take 12.5 mg by mouth once a week. Caution:Chemotherapy. Protect from light.    metoprolol  succinate (TOPROL -XL) 50 MG 24 hr tablet TAKE 1/2 TABLET BY MOUTH DAILY. TAKE WITH OR IMMEDIATELY FOLLOWING A MEAL.    olmesartan  (BENICAR ) 20 MG tablet Take 1 tablet (20 mg total) by mouth  daily.    Polyethyl Glycol-Propyl Glycol (SYSTANE OP) Apply to eye.    potassium chloride  SA (KLOR-CON  M) 20 MEQ tablet Take 20 mEq by mouth 2 (two) times daily. 08/28/2024: Once daily   prednisoLONE acetate (PRED FORTE) 1 % ophthalmic suspension Place 1 drop into both eyes daily.     rosuvastatin  (CRESTOR ) 40 MG tablet Take 1 tablet (40 mg total) by mouth daily.    timolol (TIMOPTIC) 0.5 % ophthalmic solution Place 1 drop into both eyes every morning.    No facility-administered encounter medications on file as of 09/05/2024.     ONCOLOGIC FAMILY HISTORY:  Family History  Problem Relation Age of Onset   Cancer Father 62 - 16       unknown type   Ulcers Sister    High Cholesterol Sister    Ulcers Brother    Heart disease Brother 43       MI   Prostate cancer Maternal Uncle    Breast cancer Maternal Grandmother 41 - 69   Heart disease Maternal Grandfather    Cancer Cousin        unknown types, maternal first cousins     SOCIAL HISTORY:  Social History   Socioeconomic History   Marital status: Divorced    Spouse name: Not on file   Number of  children: Not on file   Years of education: Not on file   Highest education level: Some college, no degree  Occupational History   Not on file  Tobacco Use   Smoking status: Never    Passive exposure: Never   Smokeless tobacco: Never  Vaping Use   Vaping status: Never Used  Substance and Sexual Activity   Alcohol use: No   Drug use: No   Sexual activity: Not Currently  Other Topics Concern   Not on file  Social History Narrative   Not on file   Social Drivers of Health   Financial Resource Strain: Low Risk  (06/18/2024)   Overall Financial Resource Strain (CARDIA)    Difficulty of Paying Living Expenses: Not hard at all  Food Insecurity: No Food Insecurity (06/18/2024)   Hunger Vital Sign    Worried About Running Out of Food in the Last Year: Never true    Ran Out of Food in the Last Year: Never true  Transportation  Needs: No Transportation Needs (06/18/2024)   PRAPARE - Administrator, Civil Service (Medical): No    Lack of Transportation (Non-Medical): No  Physical Activity: Insufficiently Active (06/18/2024)   Exercise Vital Sign    Days of Exercise per Week: 3 days    Minutes of Exercise per Session: 20 min  Stress: No Stress Concern Present (06/18/2024)   [employer] of Occupational Health - Occupational Stress Questionnaire    Feeling of Stress: Only a little  Social Connections: Moderately Isolated (06/18/2024)   Social Connection and Isolation Panel    Frequency of Communication with Friends and Family: Three times a week    Frequency of Social Gatherings with Friends and Family: More than three times a week    Attends Religious Services: 1 to 4 times per year    Active Member of [employer] or Organizations: No    Attends Engineer, Structural: Not on file    Marital Status: Divorced  Intimate Partner Violence: Not At Risk (03/23/2024)   Humiliation, Afraid, Rape, and Kick questionnaire    Fear of Current or Ex-Partner: No    Emotionally Abused: No    Physically Abused: No    Sexually Abused: No     OBSERVATIONS/OBJECTIVE:  BP (!) 152/54 (BP Location: Left Arm, Patient Position: Sitting) Comment: 1st BP 148/44; 2nd BP 152/54  Pulse 64   Temp (!) 97.5 F (36.4 C) (Temporal)   Resp 17   Wt 171 lb 8 oz (77.8 kg)   LMP 02/16/2003   SpO2 100%   BMI 27.68 kg/m  GENERAL: Patient is a well appearing female in no acute distress HEENT:  Sclerae anicteric.  Oropharynx clear and moist. No ulcerations or evidence of oropharyngeal candidiasis. Neck is supple.  NODES:  No cervical, supraclavicular, or axillary lymphadenopathy palpated.  Bilateral breasts examined. No palpable masses. No regional adenopathy No LE edema.   LABORATORY DATA:  None for this visit.  DIAGNOSTIC IMAGING:  None for this visit.      ASSESSMENT AND PLAN:  Ms.. Cederberg is a pleasant 78 y.o. female  with Stage 0 right breast DCIS, ER+/PR+, diagnosed in 07/2023, treated with lumpectomy and anti-estrogen therapy with Anastrozole  beginning in 11/2023.   Assessment and Plan Assessment & Plan Intraductal carcinoma in situ of right breast On anastrozole  with hair thinning as the only side effect. No new breast changes.  - she is due for her mammogram now. - Continue anastrozole . - Mammogram scheduled for January. -  Advised monthly breast self-exams and to report changes.  Osteopenia Mild osteopenia on bone density scan. - Encouraged 30 minutes of daily walking. - Recommended vitamin D supplementation.   The patient was provided an opportunity to ask questions and all were answered. The patient agreed with the plan and demonstrated an understanding of the instructions.   Total encounter time:30 minutes*in face-to-face visit time, chart review, lab review, care coordination, order entry, and documentation of the encounter time.   *Total Encounter Time as defined by the Centers for Medicare and Medicaid Services includes, in addition to the face-to-face time of a patient visit (documented in the note above) non-face-to-face time: obtaining and reviewing outside history, ordering and reviewing medications, tests or procedures, care coordination (communications with other health care professionals or caregivers) and documentation in the medical record.

## 2024-09-20 ENCOUNTER — Telehealth: Payer: Self-pay

## 2024-09-20 NOTE — Telephone Encounter (Signed)
-----   Message from [place] sent at 09/05/2024  3:04 PM EST ----- Can we fax her mammogram order to  [facility] please

## 2024-09-25 LAB — HM MAMMOGRAPHY

## 2024-09-26 ENCOUNTER — Ambulatory Visit: Payer: Self-pay

## 2024-09-26 NOTE — Telephone Encounter (Signed)
 FYI Only or Action Required?: FYI only for provider: appointment scheduled on 09/27/24.  Patient was last seen in primary care on 08/28/2024 by Joyce Norleen BROCKS, MD.  Called Nurse Triage reporting Otalgia and head ache.  Symptoms began yesterday.  Interventions attempted: OTC medications: Tylenol  and Rest, hydration, or home remedies.  Symptoms are: Tylenol  decreased pain temporarily.  Triage Disposition: See Physician Within 24 Hours  Patient/caregiver understands and will follow disposition?: Yes   Copied from CRM #[id number]. Topic: Clinical - Red Word Triage >> Sep 26, 2024  2:09 PM Lauren C wrote: Red Word that prompted transfer to Nurse Triage: Ear pain on both sides (was one side, but second side started today) and headache since last night. Reason for Disposition  Earache  Answer Assessment - Initial Assessment Questions 1. LOCATION: Where does it hurt?      Bilateral ears and head 2. ONSET: When did the sinus pain start?  (e.g., hours, days)      Headache started yesterday 3. SEVERITY: How bad is the pain?   (Scale 0-10; or none, mild, moderate or severe)     Moderate. Tylenol  helpful 4. RECURRENT SYMPTOM: Have you ever had sinus problems before? If Yes, ask: When was the last time? and What happened that time?       5. NASAL CONGESTION: Is the nose blocked? If Yes, ask: Can you open it or must you breathe through your mouth?     Not blocked 6. NASAL DISCHARGE: Do you have discharge from your nose? If so ask, What color?     clear 7. FEVER: Do you have a fever? If Yes, ask: What is it, how was it measured, and when did it start?      Denies  8. OTHER SYMPTOMS: Do you have any other symptoms? (e.g., sore throat, cough, earache, difficulty breathing)     Headache. Denies tingling, numbness, weakness, vision changes, breathing difficulties, chest pain, and all other symptoms.  9. PREGNANCY: Is there any chance you are pregnant? When was your last  menstrual period?  Protocols used: Sinus Pain or Congestion-A-AH

## 2024-09-27 ENCOUNTER — Ambulatory Visit: Admitting: Family Medicine

## 2024-09-27 ENCOUNTER — Encounter: Payer: Self-pay | Admitting: Family Medicine

## 2024-09-27 VITALS — BP 144/80 | HR 64 | Ht 66.0 in | Wt 173.6 lb

## 2024-09-27 DIAGNOSIS — H9209 Otalgia, unspecified ear: Secondary | ICD-10-CM

## 2024-09-27 NOTE — Progress Notes (Signed)
° °  Subjective:    Patient ID: Rhonda  CHRISTELLA Buchanan, female    DOB: Apr 10, 1946, 78 y.o.   MRN: [id number]  Discussed the use of AI scribe software for clinical note transcription with the patient, who gave verbal consent to proceed.  History of Present Illness   Rhonda  PANDA Buchanan is a 78 year old female who presents with bilateral ear pain and headache.  She has been experiencing left ear pain for approximately two weeks, initially intermittent, with the pain in the right ear beginning yesterday. The pain radiates down to her throat.  In addition to the ear pain, she has been experiencing headaches that started yesterday morning, waking her from sleep. No blurred vision, double vision, or weakness associated with the headaches.  She reports chills but denies fever or sore throat.  She is currently taking Tylenol  for pain relief. She has no allergies to penicillin but notes that ibuprofen causes stomach irritation.           Review of Systems     Objective:    Physical Exam Physical Exam   HEENT: Ears normal bilaterally. Throat normal. NECK: No lymphadenopathy.            Assessment & Plan:  Assessment and Plan    Bilateral otitis media Bilateral ear pain with possible low-grade infection. No serious conditions suspected. - Prescribed Augmentin  - Advised Tylenol  for pain management. - Recommended Aleve instead of ibuprofen. - Instructed to use MyChart for symptom updates. - Scheduled follow-up in ten days.  Abnormal kidney function Slightly elevated creatinine at 1.25, requires monitoring. - Monitor kidney function, especially with pain relief medications.  General health maintenance Immunizations up to date except for shingles and RSV. - Recommended shingles vaccine at a drugstore. - Recommended RSV vaccine.

## 2024-10-14 ENCOUNTER — Other Ambulatory Visit: Payer: Self-pay | Admitting: Hematology and Oncology

## 2025-02-21 ENCOUNTER — Ambulatory Visit: Admitting: Rheumatology

## 2025-04-02 ENCOUNTER — Ambulatory Visit: Payer: Self-pay

## 2025-06-20 ENCOUNTER — Ambulatory Visit: Payer: Self-pay | Admitting: Family Medicine

## 2025-09-06 ENCOUNTER — Inpatient Hospital Stay: Admitting: Hematology and Oncology
